# Patient Record
Sex: Female | Born: 1935
Health system: Southern US, Community
[De-identification: ages and names within clinical notes are randomized; demographics above are authoritative.]

## PROBLEM LIST (undated history)

## (undated) DIAGNOSIS — E785 Hyperlipidemia, unspecified: Secondary | ICD-10-CM

## (undated) DIAGNOSIS — T884XXA Failed or difficult intubation, initial encounter: Secondary | ICD-10-CM

## (undated) DIAGNOSIS — I776 Arteritis, unspecified: Secondary | ICD-10-CM

## (undated) DIAGNOSIS — J189 Pneumonia, unspecified organism: Secondary | ICD-10-CM

## (undated) DIAGNOSIS — J3089 Other allergic rhinitis: Secondary | ICD-10-CM

## (undated) DIAGNOSIS — I10 Essential (primary) hypertension: Secondary | ICD-10-CM

## (undated) DIAGNOSIS — R7303 Prediabetes: Secondary | ICD-10-CM

## (undated) DIAGNOSIS — R32 Unspecified urinary incontinence: Secondary | ICD-10-CM

## (undated) DIAGNOSIS — IMO0002 Reserved for concepts with insufficient information to code with codable children: Secondary | ICD-10-CM

## (undated) DIAGNOSIS — N952 Postmenopausal atrophic vaginitis: Secondary | ICD-10-CM

## (undated) DIAGNOSIS — K219 Gastro-esophageal reflux disease without esophagitis: Secondary | ICD-10-CM

## (undated) DIAGNOSIS — B029 Zoster without complications: Secondary | ICD-10-CM

## (undated) DIAGNOSIS — I6529 Occlusion and stenosis of unspecified carotid artery: Secondary | ICD-10-CM

## (undated) DIAGNOSIS — M1990 Unspecified osteoarthritis, unspecified site: Secondary | ICD-10-CM

## (undated) DIAGNOSIS — G629 Polyneuropathy, unspecified: Secondary | ICD-10-CM

## (undated) HISTORY — PX: THROAT SURGERY: SHX803

## (undated) HISTORY — DX: Postmenopausal atrophic vaginitis: N95.2

## (undated) HISTORY — DX: Pneumonia, unspecified organism: J18.9

## (undated) HISTORY — PX: TONSILLECTOMY: SUR1361

## (undated) HISTORY — DX: Unspecified osteoarthritis, unspecified site: M19.90

## (undated) HISTORY — DX: Essential (primary) hypertension: I10

## (undated) HISTORY — DX: Hyperlipidemia, unspecified: E78.5

## (undated) HISTORY — PX: EYE SURGERY: SHX253

## (undated) HISTORY — DX: Reserved for concepts with insufficient information to code with codable children: IMO0002

---

## 1970-08-05 HISTORY — PX: BREAST CYST EXCISION: SHX579

## 1970-08-05 HISTORY — PX: ABDOMINAL HYSTERECTOMY: SHX81

## 2004-04-01 ENCOUNTER — Other Ambulatory Visit: Payer: Self-pay

## 2004-06-13 ENCOUNTER — Ambulatory Visit: Payer: Self-pay | Admitting: Unknown Physician Specialty

## 2004-08-26 ENCOUNTER — Emergency Department: Payer: Self-pay | Admitting: Emergency Medicine

## 2004-09-01 ENCOUNTER — Inpatient Hospital Stay: Payer: Self-pay | Admitting: Internal Medicine

## 2004-10-05 ENCOUNTER — Encounter: Admission: RE | Admit: 2004-10-05 | Discharge: 2004-10-05 | Payer: Self-pay | Admitting: Nephrology

## 2004-10-25 ENCOUNTER — Ambulatory Visit: Payer: Self-pay | Admitting: Unknown Physician Specialty

## 2004-11-03 ENCOUNTER — Ambulatory Visit: Payer: Self-pay | Admitting: Unknown Physician Specialty

## 2004-12-07 ENCOUNTER — Emergency Department: Payer: Self-pay | Admitting: Emergency Medicine

## 2004-12-26 ENCOUNTER — Ambulatory Visit: Payer: Self-pay | Admitting: Unknown Physician Specialty

## 2005-01-09 ENCOUNTER — Ambulatory Visit: Payer: Self-pay | Admitting: Unknown Physician Specialty

## 2005-01-17 ENCOUNTER — Inpatient Hospital Stay: Payer: Self-pay | Admitting: Internal Medicine

## 2005-01-17 ENCOUNTER — Other Ambulatory Visit: Payer: Self-pay

## 2005-02-02 ENCOUNTER — Ambulatory Visit: Payer: Self-pay | Admitting: Unknown Physician Specialty

## 2005-02-25 ENCOUNTER — Ambulatory Visit: Payer: Self-pay | Admitting: Unknown Physician Specialty

## 2005-05-01 ENCOUNTER — Ambulatory Visit: Payer: Self-pay | Admitting: Unknown Physician Specialty

## 2005-08-30 ENCOUNTER — Ambulatory Visit: Payer: Self-pay | Admitting: Unknown Physician Specialty

## 2006-01-29 ENCOUNTER — Other Ambulatory Visit: Payer: Self-pay

## 2006-01-29 ENCOUNTER — Emergency Department: Payer: Self-pay | Admitting: Unknown Physician Specialty

## 2006-04-01 ENCOUNTER — Ambulatory Visit: Payer: Self-pay | Admitting: Unknown Physician Specialty

## 2006-06-19 ENCOUNTER — Ambulatory Visit: Payer: Self-pay | Admitting: Unknown Physician Specialty

## 2006-07-27 ENCOUNTER — Emergency Department: Payer: Self-pay | Admitting: Emergency Medicine

## 2006-08-08 ENCOUNTER — Ambulatory Visit: Payer: Self-pay | Admitting: Unknown Physician Specialty

## 2007-04-01 ENCOUNTER — Other Ambulatory Visit: Payer: Self-pay

## 2007-04-01 ENCOUNTER — Emergency Department: Payer: Self-pay | Admitting: Emergency Medicine

## 2007-04-23 ENCOUNTER — Ambulatory Visit: Payer: Self-pay | Admitting: Unknown Physician Specialty

## 2008-01-01 ENCOUNTER — Ambulatory Visit: Payer: Self-pay | Admitting: Unknown Physician Specialty

## 2008-05-04 ENCOUNTER — Ambulatory Visit: Payer: Self-pay | Admitting: Unknown Physician Specialty

## 2008-06-17 ENCOUNTER — Ambulatory Visit: Payer: Self-pay | Admitting: Unknown Physician Specialty

## 2008-09-14 ENCOUNTER — Ambulatory Visit: Payer: Self-pay | Admitting: Unknown Physician Specialty

## 2009-04-25 ENCOUNTER — Emergency Department: Payer: Self-pay | Admitting: Emergency Medicine

## 2009-05-11 ENCOUNTER — Ambulatory Visit: Payer: Self-pay | Admitting: Unknown Physician Specialty

## 2010-03-19 ENCOUNTER — Ambulatory Visit: Payer: Self-pay | Admitting: Unknown Physician Specialty

## 2010-06-06 ENCOUNTER — Ambulatory Visit: Payer: Self-pay | Admitting: Unknown Physician Specialty

## 2010-07-09 ENCOUNTER — Ambulatory Visit: Payer: Self-pay | Admitting: Unknown Physician Specialty

## 2010-07-15 ENCOUNTER — Emergency Department: Payer: Self-pay | Admitting: Unknown Physician Specialty

## 2010-07-31 ENCOUNTER — Ambulatory Visit: Payer: Self-pay | Admitting: Neurology

## 2011-04-01 ENCOUNTER — Ambulatory Visit: Payer: Self-pay | Admitting: Unknown Physician Specialty

## 2011-04-04 ENCOUNTER — Encounter: Payer: Self-pay | Admitting: Obstetrics & Gynecology

## 2011-04-11 ENCOUNTER — Encounter: Payer: Self-pay | Admitting: Obstetrics & Gynecology

## 2011-04-11 ENCOUNTER — Ambulatory Visit (INDEPENDENT_AMBULATORY_CARE_PROVIDER_SITE_OTHER): Payer: Medicare (Managed Care) | Admitting: Obstetrics & Gynecology

## 2011-04-11 VITALS — BP 128/70 | HR 62 | Ht 67.0 in | Wt 135.0 lb

## 2011-04-11 DIAGNOSIS — N899 Noninflammatory disorder of vagina, unspecified: Secondary | ICD-10-CM

## 2011-04-11 DIAGNOSIS — R102 Pelvic and perineal pain: Secondary | ICD-10-CM

## 2011-04-11 DIAGNOSIS — N949 Unspecified condition associated with female genital organs and menstrual cycle: Secondary | ICD-10-CM

## 2011-04-11 NOTE — Progress Notes (Signed)
  Subjective:    Patient ID: Carlean Purl Dirocco, female    DOB: Oct 06, 1932, 77 y.o.   MRN: [id number]  HPI  Mrs. Ureste is a lovely 77 yo widowed (6 weeks ago) woman who comes in with a 2 month history of "discomfort" in her pelvis and vagina.  She points to her bladder area when I asked her to point to her area of pain.  She uses vaginal estrogen about 2 times per week and has not been sexually active for 10 years.  She had her uterus and ovaries removed many years ago for what sounds like DUB. She is scheduled to have a cystoscopy next week because of this same issue as well as polyuria.  She has been followed by Dr. Orson Slick (urol) for many years.  Review of Systems     Objective:   Physical Exam   Minimal atrophy of vulva/vagina.  Bimanual exam normal (no pain elicited with exam) and no masses. There is a 2 cm transverse line of redness on the anterior vaginal wall about 2 cm in the vagina.  I biopsied it.     Assessment & Plan:  Pelvic pain (normal exam and ultrasound)- I suspect interstitial cystitis and she is getting a cystoscopy next week Linear redness in vagina- I will await biopsy results.

## 2011-04-11 NOTE — Progress Notes (Signed)
Addended by: Barbara Cower on: 04/11/2011 05:36 PM   Modules accepted: Orders

## 2011-05-01 ENCOUNTER — Encounter: Payer: Self-pay | Admitting: Rheumatology

## 2011-05-06 ENCOUNTER — Encounter: Payer: Self-pay | Admitting: Rheumatology

## 2011-08-19 ENCOUNTER — Ambulatory Visit: Payer: Self-pay | Admitting: Unknown Physician Specialty

## 2011-09-12 ENCOUNTER — Emergency Department: Payer: Self-pay | Admitting: Emergency Medicine

## 2011-09-12 LAB — COMPREHENSIVE METABOLIC PANEL
Anion Gap: 14 (ref 7–16)
BUN: 23 mg/dL — ABNORMAL HIGH (ref 7–18)
Bilirubin,Total: 0.5 mg/dL (ref 0.2–1.0)
Chloride: 102 mmol/L (ref 98–107)
Co2: 23 mmol/L (ref 21–32)
Creatinine: 0.94 mg/dL (ref 0.60–1.30)
EGFR (African American): >60
EGFR (Non-African Amer.): >60
Osmolality: 285 (ref 275–301)
Potassium: 3.5 mmol/L (ref 3.5–5.1)
Sodium: 139 mmol/L (ref 136–145)
Total Protein: 7.2 g/dL (ref 6.4–8.2)

## 2011-09-12 LAB — URINALYSIS, COMPLETE
Hyaline Cast: 10
Nitrite: NEGATIVE
Ph: 5 (ref 4.5–8.0)
Protein: NEGATIVE
Specific Gravity: 1.026 (ref 1.003–1.030)

## 2011-09-12 LAB — CBC
HGB: 13.5 g/dL (ref 12.0–16.0)
MCH: 31.4 pg (ref 26.0–34.0)
Platelet: 163 10*3/uL (ref 150–440)
RBC: 4.3 10*6/uL (ref 3.80–5.20)
WBC: 8.1 10*3/uL (ref 3.6–11.0)

## 2011-09-15 LAB — URINE CULTURE

## 2011-10-24 ENCOUNTER — Ambulatory Visit: Payer: Self-pay | Admitting: Unknown Physician Specialty

## 2011-10-25 LAB — PATHOLOGY REPORT

## 2011-11-26 ENCOUNTER — Encounter: Payer: Self-pay | Admitting: Obstetrics & Gynecology

## 2011-11-26 ENCOUNTER — Ambulatory Visit (INDEPENDENT_AMBULATORY_CARE_PROVIDER_SITE_OTHER): Payer: Medicare (Managed Care) | Admitting: Obstetrics & Gynecology

## 2011-11-26 VITALS — BP 120/54 | HR 64 | Ht 67.0 in | Wt 144.0 lb

## 2011-11-26 DIAGNOSIS — R102 Pelvic and perineal pain: Secondary | ICD-10-CM

## 2011-11-26 DIAGNOSIS — N949 Unspecified condition associated with female genital organs and menstrual cycle: Secondary | ICD-10-CM

## 2011-11-26 MED ORDER — ESTROGENS, CONJUGATED 0.625 MG/GM VA CREA: TOPICAL_CREAM | VAGINAL | Status: DC

## 2011-11-26 NOTE — Progress Notes (Signed)
  Subjective:    Patient ID: Dawn Mcintyre, female    DOB: 16-Jul-1933, 78 y.o.   MRN: [id number]  HPI  Ms. Rosano is a 78 yo lady who is here today with the complaint of "something's not right" in her vagina. It feels some sore. Of note, she ran out of her premarin last week. She took a course of antibiotics that she had at home.  Review of Systems     Objective:   Physical Exam  Moderate atrophy. No vaginal lesions      Assessment & Plan:  Atrophic vaginitis- I refilled her premarin.

## 2012-02-13 ENCOUNTER — Ambulatory Visit: Payer: Self-pay | Admitting: Unknown Physician Specialty

## 2012-06-04 ENCOUNTER — Telehealth: Payer: Self-pay

## 2012-06-04 NOTE — Telephone Encounter (Signed)
Outcomes requested medical records on this patient. I sent medical records on 11/31/13 @ 2:30pm faxed to [phone].

## 2012-07-31 ENCOUNTER — Ambulatory Visit: Payer: Self-pay | Admitting: Unknown Physician Specialty

## 2012-08-11 ENCOUNTER — Ambulatory Visit: Payer: Medicare (Managed Care) | Admitting: Family Medicine

## 2012-08-11 DIAGNOSIS — R1084 Generalized abdominal pain: Secondary | ICD-10-CM

## 2012-08-14 ENCOUNTER — Ambulatory Visit: Payer: Self-pay | Admitting: Unknown Physician Specialty

## 2012-09-30 ENCOUNTER — Ambulatory Visit: Payer: Self-pay | Admitting: Unknown Physician Specialty

## 2012-11-16 ENCOUNTER — Encounter: Payer: Self-pay | Admitting: Obstetrics & Gynecology

## 2012-11-16 ENCOUNTER — Ambulatory Visit (INDEPENDENT_AMBULATORY_CARE_PROVIDER_SITE_OTHER): Payer: Medicare (Managed Care) | Admitting: Obstetrics & Gynecology

## 2012-11-16 VITALS — BP 131/76 | HR 60 | Resp 16 | Ht 67.0 in | Wt 142.0 lb

## 2012-11-16 DIAGNOSIS — R300 Dysuria: Secondary | ICD-10-CM

## 2012-11-16 DIAGNOSIS — N952 Postmenopausal atrophic vaginitis: Secondary | ICD-10-CM

## 2012-11-16 MED ORDER — ESTRADIOL 0.1 MG/GM VA CREA: TOPICAL_CREAM | VAGINAL | Status: DC

## 2012-11-16 NOTE — Progress Notes (Signed)
  Subjective:    Patient ID: Dawn Mcintyre, female    DOB: [birth year], 79 y.o.   MRN: [id number]  HPI 79 yo WW lady who is here today because of post voiding vaginal pain. She is currently on amoxacillin to treat a UTI. She used her Premarin cream last year for about 4 months and had good relief of her symptoms. However, she felt so good that she discontinued using the cream.   Review of Systems   She has not been sexually active for about 12 years. Her mammogram at [facility] is UTD. She had a tail bone injury recently. Objective:   Physical Exam  Severely atrophic vulva and vagina. Speculum exam reveals a normal/atrophic vaginal cuff. Normal bimanual exam      Assessment & Plan:  Post void pain- This may be successfully treated with vaginal estrogen (QOD). If this does not cure her, then she will need a urology referral.

## 2013-02-12 ENCOUNTER — Ambulatory Visit: Payer: Self-pay | Admitting: Unknown Physician Specialty

## 2013-02-23 ENCOUNTER — Encounter: Payer: Self-pay | Admitting: Obstetrics & Gynecology

## 2013-02-23 ENCOUNTER — Ambulatory Visit (INDEPENDENT_AMBULATORY_CARE_PROVIDER_SITE_OTHER): Payer: Medicare Other | Admitting: Obstetrics & Gynecology

## 2013-02-23 VITALS — BP 131/62 | HR 60 | Ht 67.0 in | Wt 144.0 lb

## 2013-02-23 DIAGNOSIS — N898 Other specified noninflammatory disorders of vagina: Secondary | ICD-10-CM

## 2013-02-23 DIAGNOSIS — R109 Unspecified abdominal pain: Secondary | ICD-10-CM

## 2013-02-23 NOTE — Patient Instructions (Addendum)
Return to clinic for any scheduled appointments or for any gynecologic concerns as needed.   

## 2013-02-23 NOTE — Progress Notes (Signed)
GYNECOLOGY CLINIC PROGRESS NOTE  History:  79 y.o. PMP female here today for evaluation of abnormal vaginal discharge x 1 week. Associated with lower abdominal discomfort.  Also reports that this discomfort occurs 2-3 minutes after urination. Patient reports that she had these symptoms in the past and they were successfully treated with a course of vaginal Premarin cream.  She has tried this over this week, but there was no amelioration of symptoms.  No association with bowel movements.  No other GYN symptoms.    The following portions of the patient's history were reviewed and updated as appropriate: allergies, current medications, past family history, past medical history, past social history, past surgical history and problem list.  Review of Systems:  Pertinent items are noted in HPI.  Objective:  Physical Exam BP 131/62  Pulse 60  Ht 5\' 7"  (1.702 m)  Wt 144 lb (65.318 kg)  BMI 22.55 kg/m2 Gen: NAD Abd: Soft, nontender to palpation and nondistended Pelvic: Normal appearing external genitalia; vaginal mucosa with moderate atrophy. Cervical cuff is atrophic, mild prolapse noted. Scant white discharge, sample obtained for wet prep  Urine dipstick: Negative for nitrites, LE, blood.  Culture sent.   Assessment & Plan:  Patient advised to continue Premarin cream every other day as prescribed Will follow up urine culture and wet prep and manage accordingly

## 2013-02-24 LAB — WET PREP, GENITAL
Trich, Wet Prep: NONE SEEN
Yeast Wet Prep HPF POC: NONE SEEN

## 2013-02-25 LAB — URINE CULTURE: Colony Count: 2000

## 2013-08-02 ENCOUNTER — Ambulatory Visit: Payer: Self-pay | Admitting: Physician Assistant

## 2013-10-04 ENCOUNTER — Ambulatory Visit: Payer: Self-pay | Admitting: Internal Medicine

## 2014-01-28 DIAGNOSIS — I10 Essential (primary) hypertension: Secondary | ICD-10-CM | POA: Insufficient documentation

## 2014-01-28 DIAGNOSIS — E119 Type 2 diabetes mellitus without complications: Secondary | ICD-10-CM | POA: Insufficient documentation

## 2014-03-18 ENCOUNTER — Ambulatory Visit: Payer: Self-pay | Admitting: Physician Assistant

## 2014-04-07 DIAGNOSIS — M5432 Sciatica, left side: Secondary | ICD-10-CM | POA: Insufficient documentation

## 2014-04-28 DIAGNOSIS — I10 Essential (primary) hypertension: Secondary | ICD-10-CM | POA: Insufficient documentation

## 2014-06-15 DIAGNOSIS — G8929 Other chronic pain: Secondary | ICD-10-CM | POA: Insufficient documentation

## 2014-06-15 DIAGNOSIS — R1013 Epigastric pain: Secondary | ICD-10-CM

## 2014-10-18 ENCOUNTER — Ambulatory Visit: Payer: Self-pay | Admitting: Internal Medicine

## 2014-10-24 ENCOUNTER — Encounter (INDEPENDENT_AMBULATORY_CARE_PROVIDER_SITE_OTHER): Payer: Medicare Other | Admitting: Ophthalmology

## 2014-10-24 DIAGNOSIS — H318 Other specified disorders of choroid: Secondary | ICD-10-CM

## 2014-10-24 DIAGNOSIS — E11319 Type 2 diabetes mellitus with unspecified diabetic retinopathy without macular edema: Secondary | ICD-10-CM | POA: Diagnosis not present

## 2014-10-24 DIAGNOSIS — E11329 Type 2 diabetes mellitus with mild nonproliferative diabetic retinopathy without macular edema: Secondary | ICD-10-CM

## 2014-10-24 DIAGNOSIS — I10 Essential (primary) hypertension: Secondary | ICD-10-CM | POA: Diagnosis not present

## 2014-10-24 DIAGNOSIS — H43813 Vitreous degeneration, bilateral: Secondary | ICD-10-CM | POA: Diagnosis not present

## 2014-10-24 DIAGNOSIS — H35033 Hypertensive retinopathy, bilateral: Secondary | ICD-10-CM | POA: Diagnosis not present

## 2014-10-26 DIAGNOSIS — E538 Deficiency of other specified B group vitamins: Secondary | ICD-10-CM | POA: Insufficient documentation

## 2014-11-15 ENCOUNTER — Ambulatory Visit (INDEPENDENT_AMBULATORY_CARE_PROVIDER_SITE_OTHER): Payer: Medicare Other | Admitting: Ophthalmology

## 2014-11-15 DIAGNOSIS — H318 Other specified disorders of choroid: Secondary | ICD-10-CM

## 2014-11-16 ENCOUNTER — Ambulatory Visit (INDEPENDENT_AMBULATORY_CARE_PROVIDER_SITE_OTHER): Payer: Medicare Other | Admitting: Advanced Practice Midwife

## 2014-11-16 ENCOUNTER — Encounter: Payer: Self-pay | Admitting: Advanced Practice Midwife

## 2014-11-16 VITALS — BP 147/70 | HR 79 | Wt 149.0 lb

## 2014-11-16 DIAGNOSIS — N760 Acute vaginitis: Secondary | ICD-10-CM | POA: Diagnosis not present

## 2014-11-16 DIAGNOSIS — N952 Postmenopausal atrophic vaginitis: Secondary | ICD-10-CM | POA: Insufficient documentation

## 2014-11-16 HISTORY — DX: Postmenopausal atrophic vaginitis: N95.2

## 2014-11-16 MED ORDER — ESTROGENS, CONJUGATED 0.625 MG/GM VA CREA: TOPICAL_CREAM | VAGINAL | Status: DC

## 2014-11-16 NOTE — Progress Notes (Signed)
   Subjective:    Patient ID: Dawn Mcintyre, female    DOB: 02-Dec-1932, 81 y.o.   MRN: [id number]  HPI: Here for vaginal irritation and burning x 1 month. Went to PCP 3 weeks ago. Dx. BV. Rx Metrogel. Got better for a week, but then returned. Had refill and used it w/ out relief. Has used Premarin in the past for atrophic vaginitis, but ran out. Not sexually active.   Review of Systems  Pos for Sx above.  Neg for vaginal bleeding, abd pain, vaginal discharge.  Pos for HA while on Metrogel. Gone since she stopped.     Objective:   Physical Exam  GENERAL: NAD ABD: Soft, NT PELVIC: Mild erythema at introitus. Vaginal atrophic. Moderate amount of thick, white discharge mixed w/ ? Left-over Metrogel?  UA neg    Assessment & Plan:  1. Post-menopausal atrophic vaginitis  - conjugated estrogens (PREMARIN) vaginal cream; Use 1 gram per vagina every other night.  Dispense: 42.5 g; Refill: 12  2. Vaginitis  - Wet prep, genital  If Wet pos BV, will try Clinda instead of Metronidazole.   [place], CNM 11/16/2014 1:57 PM

## 2014-11-16 NOTE — Patient Instructions (Signed)

## 2014-11-17 ENCOUNTER — Telehealth: Payer: Self-pay | Admitting: *Deleted

## 2014-11-17 LAB — WET PREP, GENITAL
Clue Cells Wet Prep HPF POC: NONE SEEN
Trich, Wet Prep: NONE SEEN
WBC WET PREP: NONE SEEN
Yeast Wet Prep HPF POC: NONE SEEN

## 2014-11-17 NOTE — Telephone Encounter (Signed)
Patient called for test results.  Notified of normal results and patient will continue to use her estrogen cream.

## 2014-12-16 ENCOUNTER — Encounter (INDEPENDENT_AMBULATORY_CARE_PROVIDER_SITE_OTHER): Payer: Medicare Other | Admitting: Ophthalmology

## 2015-01-20 ENCOUNTER — Encounter (INDEPENDENT_AMBULATORY_CARE_PROVIDER_SITE_OTHER): Payer: Medicare Other | Admitting: Ophthalmology

## 2015-01-20 DIAGNOSIS — H35033 Hypertensive retinopathy, bilateral: Secondary | ICD-10-CM | POA: Diagnosis not present

## 2015-01-20 DIAGNOSIS — H318 Other specified disorders of choroid: Secondary | ICD-10-CM | POA: Diagnosis not present

## 2015-01-25 DIAGNOSIS — K219 Gastro-esophageal reflux disease without esophagitis: Secondary | ICD-10-CM | POA: Insufficient documentation

## 2015-01-25 DIAGNOSIS — E119 Type 2 diabetes mellitus without complications: Secondary | ICD-10-CM | POA: Insufficient documentation

## 2015-01-25 DIAGNOSIS — F5104 Psychophysiologic insomnia: Secondary | ICD-10-CM | POA: Insufficient documentation

## 2015-01-25 DIAGNOSIS — N3946 Mixed incontinence: Secondary | ICD-10-CM | POA: Insufficient documentation

## 2015-04-25 ENCOUNTER — Encounter (INDEPENDENT_AMBULATORY_CARE_PROVIDER_SITE_OTHER): Payer: Medicare Other | Admitting: Ophthalmology

## 2015-04-25 DIAGNOSIS — H318 Other specified disorders of choroid: Secondary | ICD-10-CM

## 2015-04-25 DIAGNOSIS — I10 Essential (primary) hypertension: Secondary | ICD-10-CM | POA: Diagnosis not present

## 2015-04-25 DIAGNOSIS — H3531 Nonexudative age-related macular degeneration: Secondary | ICD-10-CM

## 2015-04-25 DIAGNOSIS — H43813 Vitreous degeneration, bilateral: Secondary | ICD-10-CM | POA: Diagnosis not present

## 2015-04-25 DIAGNOSIS — H35033 Hypertensive retinopathy, bilateral: Secondary | ICD-10-CM

## 2015-05-06 ENCOUNTER — Emergency Department: Payer: Medicare Other

## 2015-05-06 ENCOUNTER — Emergency Department
Admission: EM | Admit: 2015-05-06 | Discharge: 2015-05-07 | Disposition: A | Discharge status: Home / Self Care | Payer: Medicare Other | Attending: Emergency Medicine | Admitting: Emergency Medicine

## 2015-05-06 DIAGNOSIS — R079 Chest pain, unspecified: Secondary | ICD-10-CM | POA: Diagnosis present

## 2015-05-06 DIAGNOSIS — Z91040 Latex allergy status: Secondary | ICD-10-CM | POA: Diagnosis not present

## 2015-05-06 DIAGNOSIS — Z7982 Long term (current) use of aspirin: Secondary | ICD-10-CM | POA: Insufficient documentation

## 2015-05-06 DIAGNOSIS — Z79899 Other long term (current) drug therapy: Secondary | ICD-10-CM | POA: Diagnosis not present

## 2015-05-06 DIAGNOSIS — I10 Essential (primary) hypertension: Secondary | ICD-10-CM | POA: Diagnosis not present

## 2015-05-06 DIAGNOSIS — Z880 Allergy status to penicillin: Secondary | ICD-10-CM | POA: Diagnosis not present

## 2015-05-06 DIAGNOSIS — A084 Viral intestinal infection, unspecified: Secondary | ICD-10-CM | POA: Insufficient documentation

## 2015-05-06 LAB — BASIC METABOLIC PANEL
ANION GAP: 11 (ref 5–15)
BUN: 25 mg/dL — AB (ref 6–20)
CALCIUM: 9.9 mg/dL (ref 8.9–10.3)
CO2: 29 mmol/L (ref 22–32)
Chloride: 97 mmol/L — ABNORMAL LOW (ref 101–111)
Creatinine, Ser: 1.15 mg/dL — ABNORMAL HIGH (ref 0.44–1.00)
GFR calc Af Amer: 51 mL/min — ABNORMAL LOW (ref >60)
GFR calc non Af Amer: 44 mL/min — ABNORMAL LOW (ref >60)
GLUCOSE: 159 mg/dL — AB (ref 65–99)
Potassium: 3.6 mmol/L (ref 3.5–5.1)
Sodium: 137 mmol/L (ref 135–145)

## 2015-05-06 LAB — CBC
HEMATOCRIT: 42.8 % (ref 35.0–47.0)
HEMOGLOBIN: 14.4 g/dL (ref 12.0–16.0)
MCH: 30.9 pg (ref 26.0–34.0)
MCHC: 33.7 g/dL (ref 32.0–36.0)
MCV: 91.6 fL (ref 80.0–100.0)
Platelets: 190 10*3/uL (ref 150–440)
RBC: 4.67 MIL/uL (ref 3.80–5.20)
RDW: 13.3 % (ref 11.5–14.5)
WBC: 9.4 10*3/uL (ref 3.6–11.0)

## 2015-05-06 LAB — TROPONIN I

## 2015-05-06 MED ORDER — ONDANSETRON HCL 4 MG/2ML IJ SOLN
4.0000 mg | Freq: Once | INTRAMUSCULAR | Status: AC
  Administered 2015-05-06: 4 mg via INTRAVENOUS

## 2015-05-06 MED ORDER — ONDANSETRON HCL 4 MG/2ML IJ SOLN
INTRAMUSCULAR | Status: AC
  Filled 2015-05-06: qty 2

## 2015-05-06 MED ORDER — GI COCKTAIL ~~LOC~~
ORAL | Status: AC
  Filled 2015-05-06: qty 30

## 2015-05-06 MED ORDER — GI COCKTAIL ~~LOC~~
30.0000 mL | Freq: Once | ORAL | Status: AC
  Administered 2015-05-06: 30 mL via ORAL

## 2015-05-06 NOTE — ED Notes (Signed)
Pt vomiting after gi cocktail administration. Order for zofran received.

## 2015-05-06 NOTE — ED Notes (Signed)
Pt assisted up to commode for diarrhea stool.

## 2015-05-06 NOTE — ED Notes (Signed)
Pt complains of "throat burning and i have such bad indigestion, is there something you can do to make me feel better, please help me?" md notified, order for gi cocktail received.

## 2015-05-06 NOTE — ED Notes (Signed)
Patient reports nausea, vomiting and chest pain for the past 4 hours.

## 2015-05-06 NOTE — ED Provider Notes (Signed)
[facility] Emergency Department Provider Note  ____________________________________________  Time seen: Approximately 11:38 PM  I have reviewed the triage vital signs and the nursing notes.   HISTORY  Chief Complaint Chest Pain    HPI Dawn Mcintyre is a 81 y.o. female with a history of borderline diabetes and who otherwise is relatively healthy for her age who presents with onset today of persistent nausea, vomiting, diarrhea.  She reports that she was in the hospital all day yesterday with a relative.  Today she was headed to the hospital and she was overcome with nausea and had to have the car pulled over for her to vomit on the side of the road.  She then immediately began having diarrhea.  She states that she has had at least 10 episodes of foul-smelling diarrhea today as well as about that many episodes of vomiting.  She is having a significant amount of burning in her chest associated with the vomiting and she feels like she is having "really bad acid reflux".  The symptoms are described as severe and they were relatively acute onset much earlier today.Nothing makes them better and nothing makes it worse, although she briefly feels better after having a bowel movement or vomiting though the symptoms to return.  She denies any lower abdominal pain, any other chest pain except for the burning, shortness of breath, fever/chills, dysuria.   Past Medical History  Diagnosis Date  . Arthritis   . Ulcer   . Pneumonia      x 81yrs ago  . Hyperlipidemia   . Hypertension   . Diabetes mellitus     border line  . Cataract   . Osteoporosis   . Atrophic vaginitis 11/16/2014    Patient Active Problem List   Diagnosis Date Noted  . Atrophic vaginitis 11/16/2014    Past Surgical History  Procedure Laterality Date  . Throat surgery      x 10 yrs ago. throat mass.  . Abdominal hysterectomy  1972  . Eye surgery      Current Outpatient Rx  Name  Route  Sig   Dispense  Refill  . ALPRAZolam (XANAX) 0.25 MG tablet   Oral   Take 0.25 mg by mouth at bedtime as needed.           Marland Kitchen aspirin 81 MG tablet   Oral   Take 81 mg by mouth daily.           Marland Kitchen conjugated estrogens (PREMARIN) vaginal cream      Use 1 gram per vagina every other night.   42.5 g   12   . Cranberry 400 MG CAPS   Oral   Take 1 tablet by mouth 2 (two) times daily.         . cyanocobalamin (,VITAMIN B-12,) 1000 MCG/ML injection   Intramuscular   Inject 1 mL into the muscle every 30 (thirty) days.         . fexofenadine (ALLEGRA) 180 MG tablet   Oral   Take 1 tablet by mouth as needed.         . fluticasone (FLONASE) 50 MCG/ACT nasal spray   Nasal   Place 2 sprays into the nose daily.         Marland Kitchen gabapentin (NEURONTIN) 100 MG capsule   Oral   Take 100 mg by mouth 3 (three) times daily.           . isosorbide mononitrate (IMDUR) 60 MG 24  hr tablet   Oral   Take 60 mg by mouth daily.           . metoprolol (LOPRESSOR) 100 MG tablet   Oral   Take 100 mg by mouth once.           . pantoprazole (PROTONIX) 40 MG tablet   Oral   Take 1 tablet by mouth daily.         . predniSONE (DELTASONE) 2.5 MG tablet   Oral   Take 2.5 mg by mouth 2 (two) times a week.           . valsartan-hydrochlorothiazide (DIOVAN-HCT) 160-12.5 MG per tablet   Oral   Take 1 tablet by mouth 2 (two) times daily.           . ondansetron (ZOFRAN) 4 MG tablet      Take 1-2 tabs by mouth every 8 hours as needed for nausea/vomiting   30 tablet   0     Allergies Amoxicillin-pot clavulanate; Codeine; Hydrocodone; Metronidazole; Propoxyphene; and Latex  Family History  Problem Relation Age of Onset  . Anuerysm Mother   . Cancer Father     prostrate cancer  . Diabetes Brother   . Hearing loss Brother     Social History Social History  Substance Use Topics  . Smoking status: Never Smoker   . Smokeless tobacco: Never Used  . Alcohol Use: No    Review of  Systems Constitutional: No fever/chills Eyes: No visual changes. ENT: No sore throat. Cardiovascular: Burning substernal chest pain Respiratory: Denies shortness of breath.  Recent cough for several days. Gastrointestinal: No abdominal pain, occasional cramping associated with the diarrhea.  Persistent vomiting and foul-smelling watery diarrhea today.  No constipation. Genitourinary: Negative for dysuria. Musculoskeletal: Negative for back pain. Skin: Negative for rash. Neurological: Negative for headaches, focal weakness or numbness.  10-point ROS otherwise negative.  ____________________________________________   PHYSICAL EXAM:  VITAL SIGNS: ED Triage Vitals  Enc Vitals Group     BP 05/06/15 2157 155/72 mmHg     Pulse Rate 05/06/15 2157 81     Resp 05/06/15 2235 16     Temp 05/06/15 2157 97.7 F (36.5 C)     Temp Source 05/06/15 2157 Oral     SpO2 05/06/15 2157 95 %     Weight 05/06/15 2150 149 lb (67.586 kg)     Height 05/06/15 2150 5' 4.5" (1.638 m)     Head Cir --      Peak Flow --      Pain Score 05/06/15 2148 8     Pain Loc --      Pain Edu? --      Excl. in [place]? --     Constitutional: Alert and oriented.  No acute distress.  Appears younger than stated age.  Surprisingly well-appearing given her current symptoms.   Eyes: Conjunctivae are normal. PERRL. EOMI. Head: Atraumatic. Nose: No congestion/rhinnorhea. Mouth/Throat: Mucous membranes are moist.  Oropharynx non-erythematous. Neck: No stridor.   Cardiovascular: Normal rate, regular rhythm. Grossly normal heart sounds.  Good peripheral circulation. Respiratory: Normal respiratory effort.  No retractions. Lungs CTAB. Gastrointestinal: Soft and nontender. No distention. No abdominal bruits. No CVA tenderness. Musculoskeletal: No lower extremity tenderness nor edema.  No joint effusions. Neurologic:  Normal speech and language. No gross focal neurologic deficits are appreciated.  Skin:  Skin is warm, dry and  intact. No rash noted. Psychiatric: Mood and affect are normal. Speech and behavior are normal.  ____________________________________________   LABS (all labs ordered are listed, but only abnormal results are displayed)  Labs Reviewed  BASIC METABOLIC PANEL - Abnormal; Notable for the following:    Chloride 97 (*)    Glucose, Bld 159 (*)    BUN 25 (*)    Creatinine, Ser 1.15 (*)    GFR calc non Af Amer 44 (*)    GFR calc Af Amer 51 (*)    All other components within normal limits  HEPATIC FUNCTION PANEL - Abnormal; Notable for the following:    Indirect Bilirubin 1.1 (*)    All other components within normal limits  CBC  TROPONIN I  LIPASE, BLOOD   ____________________________________________  EKG  ED ECG REPORT I, Lorissa Kishbaugh, the attending physician, personally viewed and interpreted this ECG.  Date: 05/07/2015 EKG Time: 21:51 Rate: 80 Rhythm: normal sinus rhythm QRS Axis: normal Intervals: normal ST/T Wave abnormalities: normal Conduction Disutrbances: none Narrative Interpretation: unremarkable  ____________________________________________  RADIOLOGY   Dg Chest 2 View  05/06/2015   CLINICAL DATA:  Acute onset of left-sided chest pain, vomiting and diarrhea. Initial encounter.  EXAM: CHEST  2 VIEW  COMPARISON:  CTA of the chest, and chest radiograph, performed 04/01/2007  FINDINGS: The lungs are well-aerated. Mild peribronchial thickening is noted. Mild left basilar atelectasis is noted. There is no evidence of pleural effusion or pneumothorax.  The heart is normal in size; the mediastinal contour is within normal limits. No acute osseous abnormalities are seen.  IMPRESSION: Mild peribronchial thickening noted. Mild left basilar atelectasis noted.   Electronically Signed   By: Garald Balding M.D.   On: 05/06/2015 22:39    ____________________________________________   PROCEDURES  Procedure(s) performed: None  Critical Care performed:  No ____________________________________________   INITIAL IMPRESSION / ASSESSMENT AND PLAN / ED COURSE  Pertinent labs & imaging results that were available during my care of the patient were reviewed by me and considered in my medical decision making (see chart for details).  The patient symptoms seem most consistent with a viral gastroenteritis.  She has had diarrhea since arriving to the room in the ED.  She has no risk factors to be concerned about C. difficile.  She is well-appearing with normal vital signs in spite of her symptoms and discomfort.  We will attempt to control her nausea and her feeling of reflux with Zofran and pantoprazole and reassess.  I do not believe her symptoms represent ACS.  She does appear to have a mild viral bronchitis as well but no sign that she would benefit from antibiotics.  She has no respiratory difficulties at this time.  I explained my plan of symptom control and discharge home if possible and she understands and agrees with plan.  ----------------------------------------- 1:11 AM on 05/07/2015 -----------------------------------------  Reportedly the patient is feeling much better at this time with improved nausea and no more pain in her chest.  She has not had any additional diarrhea.  I reassessed her and she feels very comfortable and wants to go home and go to sleep.  I gave her my usual and customary return precautions and encouraged her to come back immediately if she develops any new or worsening symptoms or if she is not able to tolerate by mouth intake. ____________________________________________  FINAL CLINICAL IMPRESSION(S) / ED DIAGNOSES  Final diagnoses:  Viral gastroenteritis      NEW MEDICATIONS STARTED DURING THIS VISIT:  Discharge Medication List as of 05/07/2015  1:15 AM    START  taking these medications   Details  ondansetron (ZOFRAN) 4 MG tablet Take 1-2 tabs by mouth every 8 hours as needed for nausea/vomiting, Print          Hinda Kehr, MD 05/07/15 [phone]

## 2015-05-06 NOTE — ED Notes (Signed)
Report received from beth, rn. Pt placed on cardiac monitor, with sinus rhythm present.

## 2015-05-07 LAB — HEPATIC FUNCTION PANEL
ALK PHOS: 68 U/L (ref 38–126)
ALT: 14 U/L (ref 14–54)
AST: 23 U/L (ref 15–41)
Albumin: 4.3 g/dL (ref 3.5–5.0)
BILIRUBIN DIRECT: 0.1 mg/dL (ref 0.1–0.5)
BILIRUBIN INDIRECT: 1.1 mg/dL — AB (ref 0.3–0.9)
Total Bilirubin: 1.2 mg/dL (ref 0.3–1.2)
Total Protein: 7.6 g/dL (ref 6.5–8.1)

## 2015-05-07 LAB — LIPASE, BLOOD: LIPASE: 25 U/L (ref 22–51)

## 2015-05-07 MED ORDER — ONDANSETRON HCL 4 MG PO TABS: ORAL_TABLET | ORAL | Status: DC

## 2015-05-07 MED ORDER — PANTOPRAZOLE SODIUM 40 MG IV SOLR
40.0000 mg | Freq: Once | INTRAVENOUS | Status: AC
  Administered 2015-05-07: 40 mg via INTRAVENOUS
  Filled 2015-05-07: qty 40

## 2015-05-07 NOTE — Discharge Instructions (Signed)
We believe your symptoms are caused by either a viral infection or possible a bad food exposure.  Either way, since your symptoms have improved, we feel it is safe for you to go home and follow up with your regular doctor.  Please read the included information and stick to a bland diet for the next two days.  Drink plenty of clear fluids, and if you were provided with a prescription, please take it according to the label instructions.    If you develop any new or worsening symptoms, including persistent vomiting not controlled with medication, fever greater than 101, severe or worsening abdominal pain, or other symptoms that concern you, please return immediately to the Emergency Department.    Viral Gastroenteritis Viral gastroenteritis is also known as stomach flu. This condition affects the stomach and intestinal tract. It can cause sudden diarrhea and vomiting. The illness typically lasts 3 to 8 days. Most people develop an immune response that eventually gets rid of the virus. While this natural response develops, the virus can make you quite ill. CAUSES  Many different viruses can cause gastroenteritis, such as rotavirus or noroviruses. You can catch one of these viruses by consuming contaminated food or water. You may also catch a virus by sharing utensils or other personal items with an infected person or by touching a contaminated surface. SYMPTOMS  The most common symptoms are diarrhea and vomiting. These problems can cause a severe loss of body fluids (dehydration) and a body salt (electrolyte) imbalance. Other symptoms may include:  Fever.  Headache.  Fatigue.  Abdominal pain. DIAGNOSIS  Your caregiver can usually diagnose viral gastroenteritis based on your symptoms and a physical exam. A stool sample may also be taken to test for the presence of viruses or other infections. TREATMENT  This illness typically goes away on its own. Treatments are aimed at rehydration. The most  serious cases of viral gastroenteritis involve vomiting so severely that you are not able to keep fluids down. In these cases, fluids must be given through an intravenous line (IV). HOME CARE INSTRUCTIONS   Drink enough fluids to keep your urine clear or pale yellow. Drink small amounts of fluids frequently and increase the amounts as tolerated.  Ask your caregiver for specific rehydration instructions.  Avoid:  Foods high in sugar.  Alcohol.  Carbonated drinks.  Tobacco.  Juice.  Caffeine drinks.  Extremely hot or cold fluids.  Fatty, greasy foods.  Too much intake of anything at one time.  Dairy products until 24 to 48 hours after diarrhea stops.  You may consume probiotics. Probiotics are active cultures of beneficial bacteria. They may lessen the amount and number of diarrheal stools in adults. Probiotics can be found in yogurt with active cultures and in supplements.  Wash your hands well to avoid spreading the virus.  Only take over-the-counter or prescription medicines for pain, discomfort, or fever as directed by your caregiver. Do not give aspirin to children. Antidiarrheal medicines are not recommended.  Ask your caregiver if you should continue to take your regular prescribed and over-the-counter medicines.  Keep all follow-up appointments as directed by your caregiver. SEEK IMMEDIATE MEDICAL CARE IF:   You are unable to keep fluids down.  You do not urinate at least once every 6 to 8 hours.  You develop shortness of breath.  You notice blood in your stool or vomit. This may look like coffee grounds.  You have abdominal pain that increases or is concentrated in one small area (  localized).  You have persistent vomiting or diarrhea.  You have a fever.  The patient is a child younger than 3 months, and he or she has a fever.  The patient is a child older than 3 months, and he or she has a fever and persistent symptoms.  The patient is a child older  than 3 months, and he or she has a fever and symptoms suddenly get worse.  The patient is a baby, and he or she has no tears when crying. MAKE SURE YOU:   Understand these instructions.  Will watch your condition.  Will get help right away if you are not doing well or get worse. Document Released: 07/22/2005 Document Revised: 10/14/2011 Document Reviewed: 05/08/2011 [facility] Patient Information 2015 [place], [place]. This information is not intended to replace advice given to you by your health care provider. Make sure you discuss any questions you have with your health care provider.

## 2015-05-07 NOTE — ED Notes (Signed)
Pt states has not pain currently, states nausea is gone. Pt states "i feel so much better, i just want to go home and get in my bed, thank you for helping me."

## 2015-05-07 NOTE — ED Notes (Signed)
Pt up to commode for diarrhea stool. Pt states nausea improved, pt denies pain or burning currently.

## 2015-07-19 DIAGNOSIS — N183 Chronic kidney disease, stage 3 unspecified: Secondary | ICD-10-CM | POA: Insufficient documentation

## 2015-07-19 DIAGNOSIS — M8580 Other specified disorders of bone density and structure, unspecified site: Secondary | ICD-10-CM | POA: Insufficient documentation

## 2015-07-19 DIAGNOSIS — E1122 Type 2 diabetes mellitus with diabetic chronic kidney disease: Secondary | ICD-10-CM | POA: Insufficient documentation

## 2015-09-16 ENCOUNTER — Encounter: Payer: Self-pay | Admitting: Emergency Medicine

## 2015-09-16 ENCOUNTER — Emergency Department
Admission: EM | Admit: 2015-09-16 | Discharge: 2015-09-16 | Disposition: A | Discharge status: Home / Self Care | Payer: Medicare Other | Attending: Emergency Medicine | Admitting: Emergency Medicine

## 2015-09-16 DIAGNOSIS — Z7951 Long term (current) use of inhaled steroids: Secondary | ICD-10-CM | POA: Diagnosis not present

## 2015-09-16 DIAGNOSIS — I10 Essential (primary) hypertension: Secondary | ICD-10-CM | POA: Diagnosis not present

## 2015-09-16 DIAGNOSIS — Z91040 Latex allergy status: Secondary | ICD-10-CM | POA: Diagnosis not present

## 2015-09-16 DIAGNOSIS — I159 Secondary hypertension, unspecified: Secondary | ICD-10-CM | POA: Insufficient documentation

## 2015-09-16 DIAGNOSIS — Z880 Allergy status to penicillin: Secondary | ICD-10-CM | POA: Insufficient documentation

## 2015-09-16 DIAGNOSIS — H6692 Otitis media, unspecified, left ear: Secondary | ICD-10-CM | POA: Insufficient documentation

## 2015-09-16 DIAGNOSIS — H6122 Impacted cerumen, left ear: Secondary | ICD-10-CM | POA: Insufficient documentation

## 2015-09-16 DIAGNOSIS — H6690 Otitis media, unspecified, unspecified ear: Secondary | ICD-10-CM

## 2015-09-16 DIAGNOSIS — H9202 Otalgia, left ear: Secondary | ICD-10-CM | POA: Diagnosis present

## 2015-09-16 DIAGNOSIS — Z79899 Other long term (current) drug therapy: Secondary | ICD-10-CM | POA: Insufficient documentation

## 2015-09-16 DIAGNOSIS — Z7982 Long term (current) use of aspirin: Secondary | ICD-10-CM | POA: Diagnosis not present

## 2015-09-16 LAB — COMPREHENSIVE METABOLIC PANEL
ALT: 12 U/L — ABNORMAL LOW (ref 14–54)
AST: 20 U/L (ref 15–41)
Albumin: 4 g/dL (ref 3.5–5.0)
Alkaline Phosphatase: 67 U/L (ref 38–126)
Anion gap: 6 (ref 5–15)
BILIRUBIN TOTAL: 0.8 mg/dL (ref 0.3–1.2)
BUN: 18 mg/dL (ref 6–20)
CALCIUM: 10.2 mg/dL (ref 8.9–10.3)
CO2: 32 mmol/L (ref 22–32)
CREATININE: 1.02 mg/dL — AB (ref 0.44–1.00)
Chloride: 101 mmol/L (ref 101–111)
GFR calc Af Amer: 59 mL/min — ABNORMAL LOW (ref >60)
GFR, EST NON AFRICAN AMERICAN: 51 mL/min — AB (ref >60)
Glucose, Bld: 122 mg/dL — ABNORMAL HIGH (ref 65–99)
POTASSIUM: 4.5 mmol/L (ref 3.5–5.1)
Sodium: 139 mmol/L (ref 135–145)
TOTAL PROTEIN: 7.1 g/dL (ref 6.5–8.1)

## 2015-09-16 LAB — CBC
HCT: 39.6 % (ref 35.0–47.0)
Hemoglobin: 13.4 g/dL (ref 12.0–16.0)
MCH: 30.4 pg (ref 26.0–34.0)
MCHC: 33.8 g/dL (ref 32.0–36.0)
MCV: 89.8 fL (ref 80.0–100.0)
PLATELETS: 208 10*3/uL (ref 150–440)
RBC: 4.41 MIL/uL (ref 3.80–5.20)
RDW: 13.4 % (ref 11.5–14.5)
WBC: 7.5 10*3/uL (ref 3.6–11.0)

## 2015-09-16 LAB — TROPONIN I: Troponin I: <0.03 ng/mL (ref <0.031)

## 2015-09-16 MED ORDER — AZITHROMYCIN 250 MG PO TABS
ORAL_TABLET | ORAL | Status: AC
Start: 2015-09-16 — End: 2015-09-21

## 2015-09-16 NOTE — ED Notes (Signed)
Noticed blood pressure elevated at home since yesterday, yesterday systolic 0000000, today 0000000

## 2015-09-16 NOTE — ED Notes (Signed)
During triage states has L arm tingling began last night.

## 2015-09-16 NOTE — Discharge Instructions (Signed)
Please seek medical attention for any high fevers, chest pain, shortness of breath, change in behavior, persistent vomiting, bloody stool or any other new or concerning symptoms.   Hypertension Hypertension is another name for high blood pressure. High blood pressure forces your heart to work harder to pump blood. A blood pressure reading has two numbers, which includes a higher number over a lower number (example: 110/72). HOME CARE   Have your blood pressure rechecked by your doctor.  Only take medicine as told by your doctor. Follow the directions carefully. The medicine does not work as well if you skip doses. Skipping doses also puts you at risk for problems.  Do not smoke.  Monitor your blood pressure at home as told by your doctor. GET HELP IF:  You think you are having a reaction to the medicine you are taking.  You have repeat headaches or feel dizzy.  You have puffiness (swelling) in your ankles.  You have trouble with your vision. GET HELP RIGHT AWAY IF:   You get a very bad headache and are confused.  You feel weak, numb, or faint.  You get chest or belly (abdominal) pain.  You throw up (vomit).  You cannot breathe very well. MAKE SURE YOU:   Understand these instructions.  Will watch your condition.  Will get help right away if you are not doing well or get worse.   This information is not intended to replace advice given to you by your health care provider. Make sure you discuss any questions you have with your health care provider.   Document Released: 01/08/2008 Document Revised: 07/27/2013 Document Reviewed: 05/14/2013 Elsevier Interactive Patient Education 2016 Elsevier Inc.   Otitis Media, Adult Otitis media is redness, soreness, and puffiness (swelling) in the space just behind your eardrum (middle ear). It may be caused by allergies or infection. It often happens along with a cold. HOME CARE  Take your medicine as told. Finish it even if you  start to feel better.  Only take over-the-counter or prescription medicines for pain, discomfort, or fever as told by your doctor.  Follow up with your doctor as told. GET HELP IF:  You have otitis media only in one ear, or bleeding from your nose, or both.  You notice a lump on your neck.  You are not getting better in 3-5 days.  You feel worse instead of better. GET HELP RIGHT AWAY IF:   You have pain that is not helped with medicine.  You have puffiness, redness, or pain around your ear.  You get a stiff neck.  You cannot move part of your face (paralysis).  You notice that the bone behind your ear hurts when you touch it. MAKE SURE YOU:   Understand these instructions.  Will watch your condition.  Will get help right away if you are not doing well or get worse.   This information is not intended to replace advice given to you by your health care provider. Make sure you discuss any questions you have with your health care provider.   Document Released: 01/08/2008 Document Revised: 08/12/2014 Document Reviewed: 02/16/2013 Elsevier Interactive Patient Education [employer].

## 2015-09-16 NOTE — ED Provider Notes (Signed)
[facility] Emergency Department Provider Note   ____________________________________________  Time seen: ~1540  I have reviewed the triage vital signs and the nursing notes.   HISTORY  Chief Complaint Hypertension   History limited by: Not Limited   HPI Dawn Mcintyre is a 82 y.o. female who presented to the emergency department today because of concerns for high blood pressure as well as possible ear infection. Patient states that she started having some pain in her left ear yesterday. She additionally felt somewhat dizzy and lightheaded. She went to the pharmacy and noted that her blood pressure was elevated in the 170s. She talked to the pharmacist who did not recommend any decongestants given that her blood pressure was high. She states that she has primarily had left ear pain. It does come and go. It will be sharp. She also has some pain in her face underneath her eyes bilaterally. She denies any fevers. Denies any chest pain or shortness breath. States she gets occasional pain in her left arm, roughly once a week. She thinks this might be related to her known arthritis.    Past Medical History  Diagnosis Date  . Arthritis   . Ulcer   . Pneumonia      x 2yrs ago  . Hyperlipidemia   . Hypertension   . Diabetes mellitus     border line  . Cataract   . Osteoporosis   . Atrophic vaginitis 11/16/2014    Patient Active Problem List   Diagnosis Date Noted  . Atrophic vaginitis 11/16/2014    Past Surgical History  Procedure Laterality Date  . Throat surgery      x 10 yrs ago. throat mass.  . Abdominal hysterectomy  1972  . Eye surgery      Current Outpatient Rx  Name  Route  Sig  Dispense  Refill  . ALPRAZolam (XANAX) 0.25 MG tablet   Oral   Take 0.25 mg by mouth at bedtime as needed.           Marland Kitchen aspirin 81 MG tablet   Oral   Take 81 mg by mouth daily.           Marland Kitchen conjugated estrogens (PREMARIN) vaginal cream      Use 1 gram  per vagina every other night.   42.5 g   12   . Cranberry 400 MG CAPS   Oral   Take 1 tablet by mouth 2 (two) times daily.         . cyanocobalamin (,VITAMIN B-12,) 1000 MCG/ML injection   Intramuscular   Inject 1 mL into the muscle every 30 (thirty) days.         . fexofenadine (ALLEGRA) 180 MG tablet   Oral   Take 1 tablet by mouth as needed.         . fluticasone (FLONASE) 50 MCG/ACT nasal spray   Nasal   Place 2 sprays into the nose daily.         Marland Kitchen gabapentin (NEURONTIN) 100 MG capsule   Oral   Take 100 mg by mouth 3 (three) times daily.           . isosorbide mononitrate (IMDUR) 60 MG 24 hr tablet   Oral   Take 60 mg by mouth daily.           . metoprolol (LOPRESSOR) 100 MG tablet   Oral   Take 100 mg by mouth once.           Marland Kitchen  ondansetron (ZOFRAN) 4 MG tablet      Take 1-2 tabs by mouth every 8 hours as needed for nausea/vomiting   30 tablet   0   . pantoprazole (PROTONIX) 40 MG tablet   Oral   Take 1 tablet by mouth daily.         . predniSONE (DELTASONE) 2.5 MG tablet   Oral   Take 2.5 mg by mouth 2 (two) times a week.           . valsartan-hydrochlorothiazide (DIOVAN-HCT) 160-12.5 MG per tablet   Oral   Take 1 tablet by mouth 2 (two) times daily.             Allergies Amoxicillin-pot clavulanate; Codeine; Hydrocodone; Metronidazole; Propoxyphene; and Latex  Family History  Problem Relation Age of Onset  . Anuerysm Mother   . Cancer Father     prostrate cancer  . Diabetes Brother   . Hearing loss Brother     Social History Social History  Substance Use Topics  . Smoking status: Never Smoker   . Smokeless tobacco: Never Used  . Alcohol Use: No    Review of Systems  Constitutional: Negative for fever. Cardiovascular: Negative for chest pain. Respiratory: Negative for shortness of breath. Gastrointestinal: Negative for abdominal pain, vomiting and diarrhea. Neurological: Negative for headaches, focal weakness or  numbness.  10-point ROS otherwise negative.  ____________________________________________   PHYSICAL EXAM:  VITAL SIGNS: ED Triage Vitals  Enc Vitals Group     BP 09/16/15 1152 134/75 mmHg     Pulse Rate 09/16/15 1152 61     Resp 09/16/15 1152 18     Temp 09/16/15 1152 98.2 F (36.8 C)     Temp Source 09/16/15 1152 Oral     SpO2 09/16/15 1152 96 %     Weight 09/16/15 1152 145 lb (65.772 kg)     Height 09/16/15 1152 5\' 6"  (1.676 m)     Head Cir --      Peak Flow --      Pain Score 09/16/15 1153 6   Constitutional: Alert and oriented. Well appearing and in no distress. Eyes: Conjunctivae are normal. PERRL. Normal extraocular movements. ENT   Head: Normocephalic and atraumatic. Slightly more ceruminous left external auditory canal. Question tympanic membrane erythema.   Nose: No congestion/rhinnorhea.   Mouth/Throat: Mucous membranes are moist.   Neck: No stridor. Hematological/Lymphatic/Immunilogical: No cervical lymphadenopathy. Cardiovascular: Normal rate, regular rhythm.  No murmurs, rubs, or gallops. Respiratory: Normal respiratory effort without tachypnea nor retractions. Breath sounds are clear and equal bilaterally. No wheezes/rales/rhonchi. Gastrointestinal: Soft and nontender. No distention. There is no CVA tenderness. Genitourinary: Deferred Musculoskeletal: Normal range of motion in all extremities. No joint effusions.  No lower extremity tenderness nor edema. Neurologic:  Normal speech and language. No gross focal neurologic deficits are appreciated.  Skin:  Skin is warm, dry and intact. No rash noted. Psychiatric: Mood and affect are normal. Speech and behavior are normal. Patient exhibits appropriate insight and judgment.  ____________________________________________    LABS (pertinent positives/negatives)  Labs Reviewed  COMPREHENSIVE METABOLIC PANEL - Abnormal; Notable for the following:    Glucose, Bld 122 (*)    Creatinine, Ser 1.02 (*)     ALT 12 (*)    GFR calc non Af Amer 51 (*)    GFR calc Af Amer 59 (*)    All other components within normal limits  CBC  TROPONIN I     ____________________________________________   EKG  I,  Nance Pear, attending physician, personally viewed and interpreted this EKG  EKG Time: 1208 Rate: 58 Rhythm: sinus bradycardia Axis: normal Intervals: qtc 416 QRS: narrow, q waves V1 ST changes: no st elevation Impression: abnormal ekg   ____________________________________________    RADIOLOGY  None  ____________________________________________   PROCEDURES  Procedure(s) performed: None  Critical Care performed: No  ____________________________________________   INITIAL IMPRESSION / ASSESSMENT AND PLAN / ED COURSE  Pertinent labs & imaging results that were available during my care of the patient were reviewed by me and considered in my medical decision making (see chart for details).  This presented to the emergency department today because of concerns for high blood pressure and left ear pain. Patient's blood pressure back to her baseline at triage. Left ear with some possible panic membrane erythema however exam is somewhat limited secondary to cerumen. Given the patient's complaint of ear pain however will treat for infection. Discussed with patient importance of following up with primary care doctor. Patient states she will try to contact them Monday.  ____________________________________________   FINAL CLINICAL IMPRESSION(S) / ED DIAGNOSES  Final diagnoses:  Ear infection  Secondary hypertension, unspecified     Nance Pear, MD 09/16/15 1557

## 2015-09-18 ENCOUNTER — Emergency Department: Payer: Medicare Other

## 2015-09-18 ENCOUNTER — Encounter: Payer: Self-pay | Admitting: Urgent Care

## 2015-09-18 DIAGNOSIS — Z7952 Long term (current) use of systemic steroids: Secondary | ICD-10-CM | POA: Diagnosis not present

## 2015-09-18 DIAGNOSIS — R200 Anesthesia of skin: Secondary | ICD-10-CM | POA: Diagnosis not present

## 2015-09-18 DIAGNOSIS — Z880 Allergy status to penicillin: Secondary | ICD-10-CM | POA: Insufficient documentation

## 2015-09-18 DIAGNOSIS — Z792 Long term (current) use of antibiotics: Secondary | ICD-10-CM | POA: Diagnosis not present

## 2015-09-18 DIAGNOSIS — I10 Essential (primary) hypertension: Secondary | ICD-10-CM | POA: Insufficient documentation

## 2015-09-18 DIAGNOSIS — Z7982 Long term (current) use of aspirin: Secondary | ICD-10-CM | POA: Insufficient documentation

## 2015-09-18 DIAGNOSIS — Z91040 Latex allergy status: Secondary | ICD-10-CM | POA: Diagnosis not present

## 2015-09-18 DIAGNOSIS — Z79899 Other long term (current) drug therapy: Secondary | ICD-10-CM | POA: Diagnosis not present

## 2015-09-18 DIAGNOSIS — R51 Headache: Secondary | ICD-10-CM | POA: Insufficient documentation

## 2015-09-18 DIAGNOSIS — R202 Paresthesia of skin: Secondary | ICD-10-CM | POA: Insufficient documentation

## 2015-09-18 LAB — CBC
HEMATOCRIT: 40.1 % (ref 35.0–47.0)
Hemoglobin: 13.3 g/dL (ref 12.0–16.0)
MCH: 30 pg (ref 26.0–34.0)
MCHC: 33.1 g/dL (ref 32.0–36.0)
MCV: 90.5 fL (ref 80.0–100.0)
Platelets: 219 10*3/uL (ref 150–440)
RBC: 4.43 MIL/uL (ref 3.80–5.20)
RDW: 13.8 % (ref 11.5–14.5)
WBC: 8.1 10*3/uL (ref 3.6–11.0)

## 2015-09-18 LAB — BASIC METABOLIC PANEL
Anion gap: 9 (ref 5–15)
BUN: 29 mg/dL — AB (ref 6–20)
CO2: 29 mmol/L (ref 22–32)
Calcium: 10.4 mg/dL — ABNORMAL HIGH (ref 8.9–10.3)
Chloride: 102 mmol/L (ref 101–111)
Creatinine, Ser: 1.18 mg/dL — ABNORMAL HIGH (ref 0.44–1.00)
GFR calc Af Amer: 49 mL/min — ABNORMAL LOW (ref >60)
GFR, EST NON AFRICAN AMERICAN: 42 mL/min — AB (ref >60)
GLUCOSE: 108 mg/dL — AB (ref 65–99)
POTASSIUM: 4.2 mmol/L (ref 3.5–5.1)
Sodium: 140 mmol/L (ref 135–145)

## 2015-09-18 LAB — TROPONIN I: Troponin I: 0.03 ng/mL (ref <0.031)

## 2015-09-18 NOTE — ED Notes (Signed)
Spoke with Jacqualine Code, MD regarding presenting c/o. MD with [facility] for cardiac protocol and head CT. Orders to be entered by this RN.

## 2015-09-18 NOTE — ED Notes (Addendum)
Patient presents to ED with c/o HYPERtension, headache, and LUE numbness that began today. Of note, patient was seen here on Saturday for similar symptoms. Patient afraid to go to sleep tonight secondary to fears of something happening; mother died from a cerebral aneurysm. Pain comes in waves. (+) vertiginous symptoms with (+) nausea reported.

## 2015-09-19 ENCOUNTER — Emergency Department
Admission: EM | Admit: 2015-09-19 | Discharge: 2015-09-19 | Disposition: A | Discharge status: Home / Self Care | Payer: Medicare Other | Attending: Emergency Medicine | Admitting: Emergency Medicine

## 2015-09-19 DIAGNOSIS — R519 Headache, unspecified: Secondary | ICD-10-CM

## 2015-09-19 DIAGNOSIS — R51 Headache: Secondary | ICD-10-CM

## 2015-09-19 DIAGNOSIS — I10 Essential (primary) hypertension: Secondary | ICD-10-CM

## 2015-09-19 MED ORDER — CLONIDINE HCL 0.1 MG PO TABS
0.1000 mg | ORAL_TABLET | Freq: Once | ORAL | Status: AC
  Administered 2015-09-19: 0.1 mg via ORAL
  Filled 2015-09-19: qty 1

## 2015-09-19 MED ORDER — NITROGLYCERIN 0.4 MG SL SUBL
0.4000 mg | SUBLINGUAL_TABLET | Freq: Once | SUBLINGUAL | Status: AC
  Administered 2015-09-19: 0.4 mg via SUBLINGUAL
  Filled 2015-09-19: qty 1

## 2015-09-19 NOTE — Discharge Instructions (Signed)
General Headache Without Cause °A headache is pain or discomfort felt around the head or neck area. The specific cause of a headache may not be found. There are many causes and types of headaches. A few common ones are: °· Tension headaches. °· Migraine headaches. °· Cluster headaches. °· Chronic daily headaches. °HOME CARE INSTRUCTIONS  °Watch your condition for any changes. Take these steps to help with your condition: °Managing Pain °· Take over-the-counter and prescription medicines only as told by your health care provider. °· Lie down in a dark, quiet room when you have a headache. °· If directed, apply ice to the head and neck area: °· Put ice in a plastic bag. °· Place a towel between your skin and the bag. °· Leave the ice on for 20 minutes, 2-3 times per day. °· Use a heating pad or hot shower to apply heat to the head and neck area as told by your health care provider. °· Keep lights dim if bright lights bother you or make your headaches worse. °Eating and Drinking °· Eat meals on a regular schedule. °· Limit alcohol use. °· Decrease the amount of caffeine you drink, or stop drinking caffeine. °General Instructions °· Keep all follow-up visits as told by your health care provider. This is important. °· Keep a headache journal to help find out what may trigger your headaches. For example, write down: °· What you eat and drink. °· How much sleep you get. °· Any change to your diet or medicines. °· Try massage or other relaxation techniques. °· Limit stress. °· Sit up straight, and do not tense your muscles. °· Do not use tobacco products, including cigarettes, chewing tobacco, or e-cigarettes. If you need help quitting, ask your health care provider. °· Exercise regularly as told by your health care provider. °· Sleep on a regular schedule. Get 7-9 hours of sleep, or the amount recommended by your health care provider. °SEEK MEDICAL CARE IF:  °· Your symptoms are not helped by medicine. °· You have a  headache that is different from the usual headache. °· You have nausea or you vomit. °· You have a fever. °SEEK IMMEDIATE MEDICAL CARE IF:  °· Your headache becomes severe. °· You have repeated vomiting. °· You have a stiff neck. °· You have a loss of vision. °· You have problems with speech. °· You have pain in the eye or ear. °· You have muscular weakness or loss of muscle control. °· You lose your balance or have trouble walking. °· You feel faint or pass out. °· You have confusion. °  °This information is not intended to replace advice given to you by your health care provider. Make sure you discuss any questions you have with your health care provider. °  °Document Released: 07/22/2005 Document Revised: 04/12/2015 Document Reviewed: 11/14/2014 °Elsevier Interactive Patient Education ©2016 Elsevier Inc. ° °Hypertension °Hypertension, commonly called high blood pressure, is when the force of blood pumping through your arteries is too strong. Your arteries are the blood vessels that carry blood from your heart throughout your body. A blood pressure reading consists of a higher number over a lower number, such as 110/72. The higher number (systolic) is the pressure inside your arteries when your heart pumps. The lower number (diastolic) is the pressure inside your arteries when your heart relaxes. Ideally you want your blood pressure below 120/80. °Hypertension forces your heart to work harder to pump blood. Your arteries may become narrow or stiff. Having untreated or   uncontrolled hypertension can cause heart attack, stroke, kidney disease, and other problems. °RISK FACTORS °Some risk factors for high blood pressure are controllable. Others are not.  °Risk factors you cannot control include:  °· Race. You may be at higher risk if you are African American. °· Age. Risk increases with age. °· Gender. Men are at higher risk than women before age 45 years. After age 65, women are at higher risk than men. °Risk factors  you can control include: °· Not getting enough exercise or physical activity. °· Being overweight. °· Getting too much fat, sugar, calories, or salt in your diet. °· Drinking too much alcohol. °SIGNS AND SYMPTOMS °Hypertension does not usually cause signs or symptoms. Extremely high blood pressure (hypertensive crisis) may cause headache, anxiety, shortness of breath, and nosebleed. °DIAGNOSIS °To check if you have hypertension, your health care provider will measure your blood pressure while you are seated, with your arm held at the level of your heart. It should be measured at least twice using the same arm. Certain conditions can cause a difference in blood pressure between your right and left arms. A blood pressure reading that is higher than normal on one occasion does not mean that you need treatment. If it is not clear whether you have high blood pressure, you may be asked to return on a different day to have your blood pressure checked again. Or, you may be asked to monitor your blood pressure at home for 1 or more weeks. °TREATMENT °Treating high blood pressure includes making lifestyle changes and possibly taking medicine. Living a healthy lifestyle can help lower high blood pressure. You may need to change some of your habits. °Lifestyle changes may include: °· Following the DASH diet. This diet is high in fruits, vegetables, and whole grains. It is low in salt, red meat, and added sugars. °· Keep your sodium intake below 2,300 mg per day. °· Getting at least 30-45 minutes of aerobic exercise at least 4 times per week. °· Losing weight if necessary. °· Not smoking. °· Limiting alcoholic beverages. °· Learning ways to reduce stress. °Your health care provider may prescribe medicine if lifestyle changes are not enough to get your blood pressure under control, and if one of the following is true: °· You are 18-59 years of age and your systolic blood pressure is above 140. °· You are 60 years of age or older,  and your systolic blood pressure is above 150. °· Your diastolic blood pressure is above 90. °· You have diabetes, and your systolic blood pressure is over 140 or your diastolic blood pressure is over 90. °· You have kidney disease and your blood pressure is above 140/90. °· You have heart disease and your blood pressure is above 140/90. °Your personal target blood pressure may vary depending on your medical conditions, your age, and other factors. °HOME CARE INSTRUCTIONS °· Have your blood pressure rechecked as directed by your health care provider.   °· Take medicines only as directed by your health care provider. Follow the directions carefully. Blood pressure medicines must be taken as prescribed. The medicine does not work as well when you skip doses. Skipping doses also puts you at risk for problems. °· Do not smoke.   °· Monitor your blood pressure at home as directed by your health care provider.  °SEEK MEDICAL CARE IF:  °· You think you are having a reaction to medicines taken. °· You have recurrent headaches or feel dizzy. °· You have swelling in your   ankles. °· You have trouble with your vision. °SEEK IMMEDIATE MEDICAL CARE IF: °· You develop a severe headache or confusion. °· You have unusual weakness, numbness, or feel faint. °· You have severe chest or abdominal pain. °· You vomit repeatedly. °· You have trouble breathing. °MAKE SURE YOU:  °· Understand these instructions. °· Will watch your condition. °· Will get help right away if you are not doing well or get worse. °  °This information is not intended to replace advice given to you by your health care provider. Make sure you discuss any questions you have with your health care provider. °  °Document Released: 07/22/2005 Document Revised: 12/06/2014 Document Reviewed: 05/14/2013 °Elsevier Interactive Patient Education ©2016 Elsevier Inc. ° °

## 2015-09-19 NOTE — ED Provider Notes (Signed)
[facility] Emergency Department Provider Note  ____________________________________________  Time seen: Approximately 111 AM  I have reviewed the triage vital signs and the nursing notes.   HISTORY  Chief Complaint Headache; Hypertension; and Tingling    HPI Dawn Mcintyre is a 82 y.o. female who comes into the hospital today because her blood pressure is elevated. The patient was here Saturday after being here for 2-3 hours her blood pressure went down and she was discharged home. She reports that today the top number is still high. The patient takes metoprolol and isosorbide. She reports that she has not missed any medications and she last saw her primary care physician a month ago. The patient planned to see her physician tomorrow but was concerned because of blood pressure was still up area the patient reports that she did have some sharp pains go up and down her head as well as in the front of her head. She felt that it was heavy across the front. The patient did not take any medication but decided just to come into the hospital to get checked out. The patient reports that she did have some numbness in her left arm further down that she also had when she was here previously. The patient was concerned so she came in to get checked out. At this point the patient's headache is gone and the numbness is improved. The patient is here to evaluate her blood pressure.The patient did have some dizziness which is also improved at this time.   Past Medical History  Diagnosis Date  . Arthritis   . Ulcer   . Pneumonia      x 70yrs ago  . Hyperlipidemia   . Hypertension   . Diabetes mellitus     border line  . Cataract   . Osteoporosis   . Atrophic vaginitis 11/16/2014    Patient Active Problem List   Diagnosis Date Noted  . Atrophic vaginitis 11/16/2014    Past Surgical History  Procedure Laterality Date  . Throat surgery      x 10 yrs ago. throat mass.  .  Abdominal hysterectomy  1972  . Eye surgery      Current Outpatient Rx  Name  Route  Sig  Dispense  Refill  . aspirin 81 MG tablet   Oral   Take 81 mg by mouth daily.           . isosorbide mononitrate (IMDUR) 60 MG 24 hr tablet   Oral   Take 60 mg by mouth daily.           . metoprolol (TOPROL-XL) 200 MG 24 hr tablet   Oral   Take 200 mg by mouth daily.         . pantoprazole (PROTONIX) 40 MG tablet   Oral   Take 1 tablet by mouth daily.         . predniSONE (DELTASONE) 2.5 MG tablet   Oral   Take 2.5 mg by mouth daily.          . valsartan-hydrochlorothiazide (DIOVAN-HCT) 160-12.5 MG per tablet   Oral   Take 1 tablet by mouth daily.          Marland Kitchen ALPRAZolam (XANAX) 0.25 MG tablet   Oral   Take 0.25 mg by mouth at bedtime as needed.           Marland Kitchen azithromycin (ZITHROMAX Z-PAK) 250 MG tablet      Take 2 tablets (500 mg) on  Day 1,  followed by 1 tablet (250 mg) once daily on Days 2 through 5.   6 each   0   . conjugated estrogens (PREMARIN) vaginal cream      Use 1 gram per vagina every other night.   42.5 g   12   . Cranberry 400 MG CAPS   Oral   Take 1 tablet by mouth 2 (two) times daily.         . cyanocobalamin (,VITAMIN B-12,) 1000 MCG/ML injection   Intramuscular   Inject 1 mL into the muscle every 30 (thirty) days.         . fexofenadine (ALLEGRA) 180 MG tablet   Oral   Take 1 tablet by mouth as needed.         . fluticasone (FLONASE) 50 MCG/ACT nasal spray   Nasal   Place 2 sprays into the nose daily.         Marland Kitchen gabapentin (NEURONTIN) 100 MG capsule   Oral   Take 100 mg by mouth 3 (three) times daily.           . metoprolol (LOPRESSOR) 100 MG tablet   Oral   Take 100 mg by mouth once.           . ondansetron (ZOFRAN) 4 MG tablet      Take 1-2 tabs by mouth every 8 hours as needed for nausea/vomiting   30 tablet   0     Allergies Amoxicillin-pot clavulanate; Codeine; Hydrocodone; Metronidazole; Propoxyphene; and  Latex  Family History  Problem Relation Age of Onset  . Anuerysm Mother   . Cancer Father     prostrate cancer  . Diabetes Brother   . Hearing loss Brother     Social History Social History  Substance Use Topics  . Smoking status: Never Smoker   . Smokeless tobacco: Never Used  . Alcohol Use: No    Review of Systems Constitutional: No fever/chills Eyes: No visual changes. ENT: No sore throat. Cardiovascular: Denies chest pain. Respiratory: Denies shortness of breath. Gastrointestinal: No abdominal pain.  No nausea, no vomiting.  No diarrhea.  No constipation. Genitourinary: Negative for dysuria. Musculoskeletal: Negative for back pain. Skin: Negative for rash. Neurological: Headache and dizziness, left arm numbness  10-point ROS otherwise negative.  ____________________________________________   PHYSICAL EXAM:  VITAL SIGNS: ED Triage Vitals  Enc Vitals Group     BP 09/18/15 2105 193/79 mmHg     Pulse Rate 09/18/15 2105 64     Resp 09/18/15 2105 14     Temp 09/18/15 2105 98 F (36.7 C)     Temp src --      SpO2 09/18/15 2105 96 %     Weight 09/18/15 2105 145 lb (65.772 kg)     Height --      Head Cir --      Peak Flow --      Pain Score 09/18/15 2105 0     Pain Loc --      Pain Edu? --      Excl. in [place]? --     Constitutional: Alert and oriented. Well appearing and in no acute distress. Eyes: Conjunctivae are normal. PERRL. EOMI. Head: Atraumatic. Nose: No congestion/rhinnorhea. Mouth/Throat: Mucous membranes are moist.  Oropharynx non-erythematous. Cardiovascular: Normal rate, regular rhythm. Grossly normal heart sounds.  Good peripheral circulation. Respiratory: Normal respiratory effort.  No retractions. Lungs CTAB. Gastrointestinal: Soft and nontender. No distention. Positive bowel sounds Musculoskeletal: No lower extremity  tenderness nor edema.   Neurologic:  Normal speech and language. No gross focal neurologic deficits are appreciated. Radial  nerves II through XII are grossly intact Skin:  Skin is warm, dry and intact.  Psychiatric: Mood and affect are normal.   ____________________________________________   LABS (all labs ordered are listed, but only abnormal results are displayed)  Labs Reviewed  BASIC METABOLIC PANEL - Abnormal; Notable for the following:    Glucose, Bld 108 (*)    BUN 29 (*)    Creatinine, Ser 1.18 (*)    Calcium 10.4 (*)    GFR calc non Af Amer 42 (*)    GFR calc Af Amer 49 (*)    All other components within normal limits  CBC  TROPONIN I   ____________________________________________  EKG  ED ECG REPORT I, Loney Hering, the attending physician, personally viewed and interpreted this ECG.   Date: 09/18/2015  EKG Time: 2109  Rate: 62  Rhythm: normal sinus rhythm  Axis: normal  Intervals:none  ST&T Change: normal  ____________________________________________  RADIOLOGY  CT head: No acute intracranial hemorrhage, age-related atrophy and chronic microvascular ischemic disease. ____________________________________________   PROCEDURES  Procedure(s) performed: None  Critical Care performed: No  ____________________________________________   INITIAL IMPRESSION / ASSESSMENT AND PLAN / ED COURSE  Pertinent labs & imaging results that were available during my care of the patient were reviewed by me and considered in my medical decision making (see chart for details).  The patient is an 82 year old female who comes into the hospital today with elevated blood pressure and a headache. The patient also reports that she did have some numbness in her arm that she has had in the past but it is gone now. The patient did not receive any medication for her headache or her numbness and that did improve. She reports that she thinks she may have some sinus disease and that she may also have some arthritis in her arm or neck. I did give the patient a dose of nitroglycerin and clonidine to  help with her blood pressure as it did go past 200. After some time in the emergency department the patient's blood pressure was improved. She told the nurse that she was ready to be discharged home because she did not want to stay her all day. The patient will be seeing her primary care physician she reports tomorrow. The patient will be discharged home to follow-up with her primary care physician. ____________________________________________   FINAL CLINICAL IMPRESSION(S) / ED DIAGNOSES  Final diagnoses:  Essential hypertension  Acute nonintractable headache, unspecified headache type      Loney Hering, MD 09/19/15 [phone]

## 2015-10-02 ENCOUNTER — Other Ambulatory Visit: Payer: Self-pay | Admitting: Physician Assistant

## 2015-10-02 ENCOUNTER — Ambulatory Visit
Admission: RE | Admit: 2015-10-02 | Discharge: 2015-10-02 | Disposition: A | Discharge status: Home / Self Care | Payer: Medicare Other | Source: Ambulatory Visit | Attending: Physician Assistant | Admitting: Physician Assistant

## 2015-10-02 DIAGNOSIS — G44319 Acute post-traumatic headache, not intractable: Secondary | ICD-10-CM

## 2015-10-15 ENCOUNTER — Observation Stay
Admission: EM | Admit: 2015-10-15 | Discharge: 2015-10-17 | Disposition: A | Discharge status: Home / Self Care | Payer: Medicare Other | Attending: Internal Medicine | Admitting: Internal Medicine

## 2015-10-15 ENCOUNTER — Emergency Department: Payer: Medicare Other

## 2015-10-15 DIAGNOSIS — Z8042 Family history of malignant neoplasm of prostate: Secondary | ICD-10-CM | POA: Diagnosis not present

## 2015-10-15 DIAGNOSIS — Z8249 Family history of ischemic heart disease and other diseases of the circulatory system: Secondary | ICD-10-CM | POA: Diagnosis not present

## 2015-10-15 DIAGNOSIS — Z885 Allergy status to narcotic agent status: Secondary | ICD-10-CM | POA: Diagnosis not present

## 2015-10-15 DIAGNOSIS — E785 Hyperlipidemia, unspecified: Secondary | ICD-10-CM | POA: Diagnosis not present

## 2015-10-15 DIAGNOSIS — Z91040 Latex allergy status: Secondary | ICD-10-CM | POA: Diagnosis not present

## 2015-10-15 DIAGNOSIS — R1010 Upper abdominal pain, unspecified: Secondary | ICD-10-CM | POA: Diagnosis not present

## 2015-10-15 DIAGNOSIS — Z90710 Acquired absence of both cervix and uterus: Secondary | ICD-10-CM | POA: Insufficient documentation

## 2015-10-15 DIAGNOSIS — I10 Essential (primary) hypertension: Secondary | ICD-10-CM | POA: Insufficient documentation

## 2015-10-15 DIAGNOSIS — E119 Type 2 diabetes mellitus without complications: Secondary | ICD-10-CM | POA: Diagnosis not present

## 2015-10-15 DIAGNOSIS — J90 Pleural effusion, not elsewhere classified: Secondary | ICD-10-CM | POA: Diagnosis not present

## 2015-10-15 DIAGNOSIS — R531 Weakness: Secondary | ICD-10-CM | POA: Diagnosis not present

## 2015-10-15 DIAGNOSIS — J189 Pneumonia, unspecified organism: Secondary | ICD-10-CM

## 2015-10-15 DIAGNOSIS — Z9109 Other allergy status, other than to drugs and biological substances: Secondary | ICD-10-CM | POA: Insufficient documentation

## 2015-10-15 DIAGNOSIS — M810 Age-related osteoporosis without current pathological fracture: Secondary | ICD-10-CM | POA: Insufficient documentation

## 2015-10-15 DIAGNOSIS — J101 Influenza due to other identified influenza virus with other respiratory manifestations: Secondary | ICD-10-CM | POA: Diagnosis not present

## 2015-10-15 DIAGNOSIS — Z8489 Family history of other specified conditions: Secondary | ICD-10-CM | POA: Diagnosis not present

## 2015-10-15 DIAGNOSIS — R110 Nausea: Secondary | ICD-10-CM | POA: Insufficient documentation

## 2015-10-15 DIAGNOSIS — N390 Urinary tract infection, site not specified: Secondary | ICD-10-CM | POA: Diagnosis present

## 2015-10-15 DIAGNOSIS — J111 Influenza due to unidentified influenza virus with other respiratory manifestations: Secondary | ICD-10-CM | POA: Diagnosis present

## 2015-10-15 DIAGNOSIS — R05 Cough: Secondary | ICD-10-CM | POA: Diagnosis not present

## 2015-10-15 DIAGNOSIS — R197 Diarrhea, unspecified: Secondary | ICD-10-CM | POA: Insufficient documentation

## 2015-10-15 DIAGNOSIS — R55 Syncope and collapse: Secondary | ICD-10-CM | POA: Diagnosis not present

## 2015-10-15 DIAGNOSIS — I959 Hypotension, unspecified: Secondary | ICD-10-CM | POA: Insufficient documentation

## 2015-10-15 DIAGNOSIS — Z833 Family history of diabetes mellitus: Secondary | ICD-10-CM | POA: Diagnosis not present

## 2015-10-15 DIAGNOSIS — N952 Postmenopausal atrophic vaginitis: Secondary | ICD-10-CM | POA: Diagnosis not present

## 2015-10-15 DIAGNOSIS — M1990 Unspecified osteoarthritis, unspecified site: Secondary | ICD-10-CM | POA: Diagnosis not present

## 2015-10-15 DIAGNOSIS — Z881 Allergy status to other antibiotic agents status: Secondary | ICD-10-CM | POA: Diagnosis not present

## 2015-10-15 LAB — COMPREHENSIVE METABOLIC PANEL
ALK PHOS: 59 U/L (ref 38–126)
ALT: 17 U/L (ref 14–54)
ANION GAP: 9 (ref 5–15)
AST: 35 U/L (ref 15–41)
Albumin: 3.9 g/dL (ref 3.5–5.0)
BILIRUBIN TOTAL: 0.8 mg/dL (ref 0.3–1.2)
BUN: 20 mg/dL (ref 6–20)
CHLORIDE: 95 mmol/L — AB (ref 101–111)
CO2: 28 mmol/L (ref 22–32)
CREATININE: 1.25 mg/dL — AB (ref 0.44–1.00)
Calcium: 9.2 mg/dL (ref 8.9–10.3)
GFR calc Af Amer: 46 mL/min — ABNORMAL LOW (ref >60)
GFR, EST NON AFRICAN AMERICAN: 40 mL/min — AB (ref >60)
GLUCOSE: 145 mg/dL — AB (ref 65–99)
Potassium: 3.2 mmol/L — ABNORMAL LOW (ref 3.5–5.1)
Sodium: 132 mmol/L — ABNORMAL LOW (ref 135–145)
Total Protein: 7 g/dL (ref 6.5–8.1)

## 2015-10-15 LAB — RAPID INFLUENZA A&B ANTIGENS
Influenza A (ARMC): POSITIVE — AB
Influenza B (ARMC): NEGATIVE

## 2015-10-15 LAB — CBC WITH DIFFERENTIAL/PLATELET
Basophils Absolute: 0.1 10*3/uL (ref 0–0.1)
Basophils Relative: 1 %
Eosinophils Absolute: 0 10*3/uL (ref 0–0.7)
Eosinophils Relative: 0 %
HEMATOCRIT: 37.3 % (ref 35.0–47.0)
HEMOGLOBIN: 12.7 g/dL (ref 12.0–16.0)
LYMPHS PCT: 13 %
Lymphs Abs: 0.9 10*3/uL — ABNORMAL LOW (ref 1.0–3.6)
MCH: 31.1 pg (ref 26.0–34.0)
MCHC: 33.9 g/dL (ref 32.0–36.0)
MCV: 91.7 fL (ref 80.0–100.0)
MONO ABS: 1 10*3/uL — AB (ref 0.2–0.9)
MONOS PCT: 15 %
NEUTROS ABS: 4.9 10*3/uL (ref 1.4–6.5)
Neutrophils Relative %: 71 %
Platelets: 164 10*3/uL (ref 150–440)
RBC: 4.07 MIL/uL (ref 3.80–5.20)
RDW: 13.3 % (ref 11.5–14.5)
WBC: 6.9 10*3/uL (ref 3.6–11.0)

## 2015-10-15 LAB — URINALYSIS COMPLETE WITH MICROSCOPIC (ARMC ONLY)
BILIRUBIN URINE: NEGATIVE
Glucose, UA: NEGATIVE mg/dL
KETONES UR: NEGATIVE mg/dL
NITRITE: NEGATIVE
PH: 6 (ref 5.0–8.0)
PROTEIN: 30 mg/dL — AB
SPECIFIC GRAVITY, URINE: 1.012 (ref 1.005–1.030)

## 2015-10-15 LAB — TROPONIN I: Troponin I: <0.03 ng/mL (ref <0.031)

## 2015-10-15 LAB — LIPASE, BLOOD: LIPASE: 18 U/L (ref 11–51)

## 2015-10-15 LAB — GLUCOSE, CAPILLARY: GLUCOSE-CAPILLARY: 105 mg/dL — AB (ref 65–99)

## 2015-10-15 LAB — LACTIC ACID, PLASMA
LACTIC ACID, VENOUS: 1.6 mmol/L (ref 0.5–2.0)
LACTIC ACID, VENOUS: 1.3 mmol/L (ref 0.5–2.0)

## 2015-10-15 MED ORDER — DEXTROSE 5 % IV SOLN: 1.0000 g | Freq: Once | INTRAVENOUS | Status: DC

## 2015-10-15 MED ORDER — ASPIRIN 81 MG PO TABS
81.0000 mg | ORAL_TABLET | Freq: Every day | ORAL | Status: DC
  Filled 2015-10-15: qty 1

## 2015-10-15 MED ORDER — LORATADINE 10 MG PO TABS
10.0000 mg | ORAL_TABLET | Freq: Every day | ORAL | Status: DC
  Administered 2015-10-16 – 2015-10-17 (×2): 10 mg via ORAL
  Filled 2015-10-15 (×2): qty 1

## 2015-10-15 MED ORDER — SODIUM CHLORIDE 0.9 % IV BOLUS (SEPSIS)
1000.0000 mL | Freq: Once | INTRAVENOUS | Status: AC
  Administered 2015-10-15: 1000 mL via INTRAVENOUS

## 2015-10-15 MED ORDER — IBUPROFEN 200 MG PO TABS: 400.0000 mg | ORAL_TABLET | Freq: Four times a day (QID) | ORAL | Status: DC | PRN

## 2015-10-15 MED ORDER — PREDNISONE 5 MG PO TABS
2.5000 mg | ORAL_TABLET | Freq: Every day | ORAL | Status: DC
  Administered 2015-10-16 – 2015-10-17 (×2): 2.5 mg via ORAL
  Filled 2015-10-15 (×2): qty 1

## 2015-10-15 MED ORDER — DEXTROSE 5 % IV SOLN: 500.0000 mg | Freq: Once | INTRAVENOUS | Status: DC

## 2015-10-15 MED ORDER — ALPRAZOLAM 0.25 MG PO TABS: 0.2500 mg | ORAL_TABLET | Freq: Every evening | ORAL | Status: DC | PRN

## 2015-10-15 MED ORDER — ISOSORBIDE MONONITRATE ER 30 MG PO TB24
60.0000 mg | ORAL_TABLET | Freq: Every day | ORAL | Status: DC
  Administered 2015-10-17: 60 mg via ORAL
  Filled 2015-10-15 (×2): qty 2

## 2015-10-15 MED ORDER — INSULIN ASPART 100 UNIT/ML ~~LOC~~ SOLN
0.0000 [IU] | Freq: Three times a day (TID) | SUBCUTANEOUS | Status: DC
  Administered 2015-10-17: 2 [IU] via SUBCUTANEOUS
  Filled 2015-10-15: qty 2

## 2015-10-15 MED ORDER — INSULIN ASPART 100 UNIT/ML ~~LOC~~ SOLN: 0.0000 [IU] | Freq: Every day | SUBCUTANEOUS | Status: DC

## 2015-10-15 MED ORDER — ENOXAPARIN SODIUM 40 MG/0.4ML ~~LOC~~ SOLN
40.0000 mg | SUBCUTANEOUS | Status: DC
  Administered 2015-10-15 – 2015-10-16 (×2): 40 mg via SUBCUTANEOUS
  Filled 2015-10-15 (×2): qty 0.4

## 2015-10-15 MED ORDER — METOPROLOL TARTRATE 50 MG PO TABS
100.0000 mg | ORAL_TABLET | Freq: Two times a day (BID) | ORAL | Status: DC
  Filled 2015-10-15 (×5): qty 2

## 2015-10-15 MED ORDER — OSELTAMIVIR PHOSPHATE 75 MG PO CAPS
75.0000 mg | ORAL_CAPSULE | Freq: Once | ORAL | Status: AC
  Administered 2015-10-15: 75 mg via ORAL
  Filled 2015-10-15: qty 1

## 2015-10-15 MED ORDER — GABAPENTIN 100 MG PO CAPS
100.0000 mg | ORAL_CAPSULE | Freq: Three times a day (TID) | ORAL | Status: DC
  Administered 2015-10-15 – 2015-10-17 (×5): 100 mg via ORAL
  Filled 2015-10-15 (×5): qty 1

## 2015-10-15 MED ORDER — ACETAMINOPHEN 650 MG RE SUPP: 650.0000 mg | Freq: Four times a day (QID) | RECTAL | Status: DC | PRN

## 2015-10-15 MED ORDER — ACETAMINOPHEN 325 MG PO TABS: 650.0000 mg | ORAL_TABLET | Freq: Four times a day (QID) | ORAL | Status: DC | PRN

## 2015-10-15 MED ORDER — OSELTAMIVIR PHOSPHATE 75 MG PO CAPS: 75.0000 mg | ORAL_CAPSULE | Freq: Two times a day (BID) | ORAL | Status: DC

## 2015-10-15 MED ORDER — ONDANSETRON HCL 4 MG/2ML IJ SOLN: 4.0000 mg | Freq: Four times a day (QID) | INTRAMUSCULAR | Status: DC | PRN

## 2015-10-15 MED ORDER — GUAIFENESIN-DM 100-10 MG/5ML PO SYRP
5.0000 mL | ORAL_SOLUTION | ORAL | Status: DC | PRN
  Administered 2015-10-15: 5 mL via ORAL
  Filled 2015-10-15: qty 5

## 2015-10-15 MED ORDER — OSELTAMIVIR PHOSPHATE 30 MG PO CAPS
30.0000 mg | ORAL_CAPSULE | Freq: Two times a day (BID) | ORAL | Status: DC
  Administered 2015-10-16 – 2015-10-17 (×3): 30 mg via ORAL
  Filled 2015-10-15 (×4): qty 1

## 2015-10-15 MED ORDER — ONDANSETRON HCL 4 MG PO TABS: 4.0000 mg | ORAL_TABLET | Freq: Four times a day (QID) | ORAL | Status: DC | PRN

## 2015-10-15 MED ORDER — POLYETHYLENE GLYCOL 3350 17 G PO PACK: 17.0000 g | PACK | Freq: Every day | ORAL | Status: DC | PRN

## 2015-10-15 MED ORDER — PANTOPRAZOLE SODIUM 40 MG PO TBEC
40.0000 mg | DELAYED_RELEASE_TABLET | Freq: Every day | ORAL | Status: DC
  Administered 2015-10-15 – 2015-10-17 (×3): 40 mg via ORAL
  Filled 2015-10-15 (×3): qty 1

## 2015-10-15 MED ORDER — FLUTICASONE PROPIONATE 50 MCG/ACT NA SUSP
2.0000 | Freq: Every day | NASAL | Status: DC
  Administered 2015-10-17: 2 via NASAL
  Filled 2015-10-15: qty 16

## 2015-10-15 NOTE — Care Management Obs Status (Signed)
[facility] NOTIFICATION   Patient Details  Name: Dawn Mcintyre MRN: [id number] Date of Birth: [birth year]   Medicare Observation Status Notification Given:  Yes    Ival Bible, RN 10/15/2015, 7:14 PM

## 2015-10-15 NOTE — ED Notes (Signed)
Pt presents to ED from [facility] clinic with low blood pressure,    She c/o nausea and diarrhea, and severe cough

## 2015-10-15 NOTE — H&P (Signed)
[place] [facility] [place] NAME: Dawn Mcintyre    MR#:  [id number]  DATE OF BIRTH:  Mar 14, 1933  DATE OF ADMISSION:  10/15/2015  PRIMARY CARE PHYSICIAN: Glendon Axe, MD   REQUESTING/REFERRING PHYSICIAN: Dr. Lucita Lora  CHIEF COMPLAINT:   Chief Complaint  Patient presents with  . Weakness  . Diarrhea    HISTORY OF PRESENT ILLNESS:  Dawn Mcintyre  is a 82 y.o. female with a known history of hypertension, diabetes presents to the emergency room complaining of 2 days of cough with yellow sputum. She has had nausea and one episode of vomiting yesterday. Diarrhea yesterday which has resolved. She does complain of some mild upper abdominal pain on coughing. She has felt dizzy and extremely weak. Lives alone. Chest x-ray shows bibasilar atelectasis versus pneumonia. No recent antibiotic use. No sick contacts. Afebrile and has normal white count.  Patient is being admitted under observation.  PAST MEDICAL HISTORY:   Past Medical History  Diagnosis Date  . Arthritis   . Ulcer   . Pneumonia      x 23yrs ago  . Hyperlipidemia   . Hypertension   . Diabetes mellitus     border line  . Cataract   . Osteoporosis   . Atrophic vaginitis 11/16/2014    PAST SURGICAL HISTORY:   Past Surgical History  Procedure Laterality Date  . Throat surgery      x 10 yrs ago. throat mass.  . Abdominal hysterectomy  1972  . Eye surgery      SOCIAL HISTORY:   Social History  Substance Use Topics  . Smoking status: Never Smoker   . Smokeless tobacco: Never Used  . Alcohol Use: No    FAMILY HISTORY:   Family History  Problem Relation Age of Onset  . Anuerysm Mother   . Cancer Father     prostrate cancer  . Diabetes Brother   . Hearing loss Brother     DRUG ALLERGIES:   Allergies  Allergen Reactions  . Amoxicillin-Pot Clavulanate Other (See Comments)  . Codeine Hives  . Hydrocodone Nausea Only  . Metronidazole Nausea Only  .  Propoxyphene Nausea Only  . Latex Rash    REVIEW OF SYSTEMS:   Review of Systems  Constitutional: Positive for malaise/fatigue. Negative for fever, chills and weight loss.  HENT: Negative for hearing loss and nosebleeds.   Eyes: Negative for blurred vision, double vision and pain.  Respiratory: Positive for cough and sputum production. Negative for hemoptysis, shortness of breath and wheezing.   Cardiovascular: Negative for chest pain, palpitations, orthopnea and leg swelling.  Gastrointestinal: Positive for nausea and vomiting. Negative for abdominal pain, diarrhea and constipation.  Genitourinary: Negative for dysuria and hematuria.  Musculoskeletal: Negative for myalgias, back pain and falls.  Skin: Negative for rash.  Neurological: Positive for dizziness and weakness. Negative for tremors, sensory change, speech change, focal weakness, seizures and headaches.  Endo/Heme/Allergies: Does not bruise/bleed easily.  Psychiatric/Behavioral: Negative for depression and memory loss. The patient is not nervous/anxious.     MEDICATIONS AT HOME:   Prior to Admission medications   Medication Sig Start Date End Date Taking? Authorizing Provider  ALPRAZolam (XANAX) 0.25 MG tablet Take 0.25 mg by mouth at bedtime as needed.     Yes Historical Provider, MD  aspirin 81 MG tablet Take 81 mg by mouth daily.     Yes Historical Provider, MD  Cranberry 400 MG CAPS Take 1 tablet by mouth 2 (two)  times daily.   Yes Historical Provider, MD  cyanocobalamin (,VITAMIN B-12,) 1000 MCG/ML injection Inject 1 mL into the muscle every 30 (thirty) days. 01/10/15 01/05/16 Yes Historical Provider, MD  fexofenadine (ALLEGRA) 180 MG tablet Take 1 tablet by mouth as needed.   Yes Historical Provider, MD  fluticasone (FLONASE) 50 MCG/ACT nasal spray Place 2 sprays into the nose daily. 02/19/15 02/19/16 Yes Historical Provider, MD  gabapentin (NEURONTIN) 100 MG capsule Take 100 mg by mouth 3 (three) times daily.     Yes  Historical Provider, MD  isosorbide mononitrate (IMDUR) 60 MG 24 hr tablet Take 60 mg by mouth daily.     Yes Historical Provider, MD  metoprolol (LOPRESSOR) 100 MG tablet Take 100 mg by mouth 2 (two) times daily.    Yes Historical Provider, MD  pantoprazole (PROTONIX) 40 MG tablet Take 1 tablet by mouth daily. 04/28/15  Yes Historical Provider, MD  predniSONE (DELTASONE) 2.5 MG tablet Take 2.5 mg by mouth daily.    Yes Historical Provider, MD  valsartan-hydrochlorothiazide (DIOVAN-HCT) 160-12.5 MG per tablet Take 1 tablet by mouth daily.    Yes Historical Provider, MD     VITAL SIGNS:  Blood pressure 121/49, pulse 72, temperature 99.4 F (37.4 C), temperature source Oral, resp. rate 18, SpO2 100 %.  PHYSICAL EXAMINATION:  Physical Exam  GENERAL:  82 y.o.-year-old patient lying in the bed with no acute distress.  EYES: Pupils equal, round, reactive to light and accommodation. No scleral icterus. Extraocular muscles intact.  HEENT: Head atraumatic, normocephalic. Oropharynx and nasopharynx clear. No oropharyngeal erythema, moist oral mucosa  NECK:  Supple, no jugular venous distention. No thyroid enlargement, no tenderness.  LUNGS: Normal breath sounds bilaterally, no wheezing, rales, rhonchi. No use of accessory muscles of respiration.  CARDIOVASCULAR: S1, S2 normal. No murmurs, rubs, or gallops.  ABDOMEN: Soft, nontender, nondistended. Bowel sounds present. No organomegaly or mass.  EXTREMITIES: No pedal edema, cyanosis, or clubbing. + 2 pedal & radial pulses b/l.   NEUROLOGIC: Cranial nerves II through XII are intact. No focal Motor or sensory deficits appreciated b/l PSYCHIATRIC: The patient is alert and oriented x 3. Good affect.  SKIN: No obvious rash, lesion, or ulcer.   LABORATORY PANEL:   CBC  Recent Labs Lab 10/15/15 1502  WBC 6.9  HGB 12.7  HCT 37.3  PLT 164    ------------------------------------------------------------------------------------------------------------------  Chemistries   Recent Labs Lab 10/15/15 1502  NA 132*  K 3.2*  CL 95*  CO2 28  GLUCOSE 145*  BUN 20  CREATININE 1.25*  CALCIUM 9.2  AST 35  ALT 17  ALKPHOS 59  BILITOT 0.8   ------------------------------------------------------------------------------------------------------------------  Cardiac Enzymes  Recent Labs Lab 10/15/15 1502  TROPONINI <0.03   ------------------------------------------------------------------------------------------------------------------  RADIOLOGY:  Dg Chest 2 View  10/15/2015  CLINICAL DATA:  Hypotension.  Nausea, diarrhea, and cough. EXAM: CHEST - 2 VIEW COMPARISON:  Two-view chest x-ray 05/06/2015. FINDINGS: The heart is mildly enlarged. New bilateral pleural effusions are present. Bibasilar airspace disease is evident. The upper lung fields are clear. The visualized soft tissues and bony thorax are unremarkable. IMPRESSION: 1. New bilateral pleural effusions. 2. Increasing bibasilar airspace disease, left greater than right. While this may represent atelectasis, it is concerning for infection. Electronically Signed   By: San Morelle M.D.   On: 10/15/2015 15:27     IMPRESSION AND PLAN:   * Bibasilar infiltrates with influenza A positive Influenza pneumonitis Start Tamiflu. Nebulizer when necessary. Cough medication. Afebrile and normal white count.  Patient will be admitted under observation. Oxygen as needed  * Hypertension Low normal blood pressure which is improving with IV fluids. Ordered Imdur from tomorrow. He'll valsartan and hydrochlorothiazide.  * Diabetes mellitus Sliding scale insulin and diabetic diet  * DVT prophylaxis with Lovenox  All the records are reviewed and case discussed with ED provider. Management plans discussed with the patient, family and they are in agreement.  CODE STATUS:  FULL  TOTAL TIME TAKING CARE OF THIS PATIENT: 40 minutes.   Hillary Bow R M.D on 10/15/2015 at 5:37 PM  Between 7am to 6pm - Pager - [phone]  After 6pm go to [email or web address] - password [email or web address] [facility]  Office  [phone]  CC: Primary care physician; Glendon Axe, MD  Note: This dictation was prepared with Dragon dictation along with smaller phrase technology. Any transcriptional errors that result from this process are unintentional.

## 2015-10-15 NOTE — ED Provider Notes (Signed)
[facility] Emergency Department Provider Note  ____________________________________________  Time seen: Approximately 250 PM  I have reviewed the triage vital signs and the nursing notes.   HISTORY  Chief Complaint Weakness and Diarrhea    HPI Dawn Mcintyre is a 82 y.o. female who is presenting to the emergency department today with hypotension and near syncope. She says that over the past 2 days she has had body aches, nausea, diarrhea and cough. She says that the diarrhea has stopped at this point. However, she feels significantly weak today, diffusely. She was taken to the [place] clinic by her neighbor where she was found to have a systolic blood pressure in the 80s. She denies any blood in her stool.    Past Medical History  Diagnosis Date  . Arthritis   . Ulcer   . Pneumonia      x 59yrs ago  . Hyperlipidemia   . Hypertension   . Diabetes mellitus     border line  . Cataract   . Osteoporosis   . Atrophic vaginitis 11/16/2014    Patient Active Problem List   Diagnosis Date Noted  . Atrophic vaginitis 11/16/2014    Past Surgical History  Procedure Laterality Date  . Throat surgery      x 10 yrs ago. throat mass.  . Abdominal hysterectomy  1972  . Eye surgery      Current Outpatient Rx  Name  Route  Sig  Dispense  Refill  . ALPRAZolam (XANAX) 0.25 MG tablet   Oral   Take 0.25 mg by mouth at bedtime as needed.           Marland Kitchen aspirin 81 MG tablet   Oral   Take 81 mg by mouth daily.           Marland Kitchen conjugated estrogens (PREMARIN) vaginal cream      Use 1 gram per vagina every other night.   42.5 g   12   . Cranberry 400 MG CAPS   Oral   Take 1 tablet by mouth 2 (two) times daily.         . cyanocobalamin (,VITAMIN B-12,) 1000 MCG/ML injection   Intramuscular   Inject 1 mL into the muscle every 30 (thirty) days.         . fexofenadine (ALLEGRA) 180 MG tablet   Oral   Take 1 tablet by mouth as needed.         .  fluticasone (FLONASE) 50 MCG/ACT nasal spray   Nasal   Place 2 sprays into the nose daily.         Marland Kitchen gabapentin (NEURONTIN) 100 MG capsule   Oral   Take 100 mg by mouth 3 (three) times daily.           . isosorbide mononitrate (IMDUR) 60 MG 24 hr tablet   Oral   Take 60 mg by mouth daily.           . metoprolol (LOPRESSOR) 100 MG tablet   Oral   Take 100 mg by mouth once.           . metoprolol (TOPROL-XL) 200 MG 24 hr tablet   Oral   Take 200 mg by mouth daily.         . ondansetron (ZOFRAN) 4 MG tablet      Take 1-2 tabs by mouth every 8 hours as needed for nausea/vomiting   30 tablet   0   . pantoprazole (PROTONIX)  40 MG tablet   Oral   Take 1 tablet by mouth daily.         . predniSONE (DELTASONE) 2.5 MG tablet   Oral   Take 2.5 mg by mouth daily.          . valsartan-hydrochlorothiazide (DIOVAN-HCT) 160-12.5 MG per tablet   Oral   Take 1 tablet by mouth daily.            Allergies Amoxicillin-pot clavulanate; Codeine; Hydrocodone; Metronidazole; Propoxyphene; and Latex  Family History  Problem Relation Age of Onset  . Anuerysm Mother   . Cancer Father     prostrate cancer  . Diabetes Brother   . Hearing loss Brother     Social History Social History  Substance Use Topics  . Smoking status: Never Smoker   . Smokeless tobacco: Never Used  . Alcohol Use: No    Review of Systems Constitutional: No fever/chills Eyes: No visual changes. ENT: No sore throat. Cardiovascular: Denies chest pain. Respiratory: Denies shortness of breath. Gastrointestinal:   No constipation. Genitourinary: Negative for dysuria. Musculoskeletal: Negative for back pain. Skin: Negative for rash. Neurological: Negative for headaches, focal weakness or numbness.  10-point ROS otherwise negative.  ____________________________________________   PHYSICAL EXAM:  VITAL SIGNS: ED Triage Vitals  Enc Vitals Group     BP --      Pulse --      Resp --       Temp --      Temp src --      SpO2 --      Weight --      Height --      Head Cir --      Peak Flow --      Pain Score --      Pain Loc --      Pain Edu? --      Excl. in [place]? --     Constitutional: Alert and oriented.  in no acute distress.  Pale skin. Eyes: Conjunctivae are normal. PERRL. EOMI. Head: Atraumatic. Nose: No congestion/rhinnorhea. Mouth/Throat: Mucous membranes are Dry    Neck: No stridor.   Cardiovascular: Normal rate, regular rhythm. Grossly normal heart sounds.  Good peripheral circulation. Respiratory: Normal respiratory effort.  No retractions. Lungs CTAB. Gastrointestinal: Soft and nontender. No distention.  No CVA tenderness. Musculoskeletal: No lower extremity tenderness nor edema.  No joint effusions. Neurologic:  Normal speech and language. No gross focal neurologic deficits are appreciated.  Skin:  Skin is warm, dry and intact. No rash noted. Psychiatric: Mood and affect are normal. Speech and behavior are normal.  ____________________________________________   LABS (all labs ordered are listed, but only abnormal results are displayed)  Labs Reviewed  RAPID INFLUENZA A&B ANTIGENS ([facility] ONLY) - Abnormal; Notable for the following:    Influenza A ([facility]) POSITIVE (*)    All other components within normal limits  CBC WITH DIFFERENTIAL/PLATELET - Abnormal; Notable for the following:    Lymphs Abs 0.9 (*)    Monocytes Absolute 1.0 (*)    All other components within normal limits  COMPREHENSIVE METABOLIC PANEL - Abnormal; Notable for the following:    Sodium 132 (*)    Potassium 3.2 (*)    Chloride 95 (*)    Glucose, Bld 145 (*)    Creatinine, Ser 1.25 (*)    GFR calc non Af Amer 40 (*)    GFR calc Af Amer 46 (*)    All other components within normal limits  URINALYSIS COMPLETEWITH MICROSCOPIC ([facility] ONLY) - Abnormal; Notable for the following:    Color, Urine YELLOW (*)    APPearance HAZY (*)    Hgb urine dipstick 1+ (*)    Protein, ur 30 (*)     Leukocytes, UA 3+ (*)    Bacteria, UA RARE (*)    Squamous Epithelial / LPF 0-5 (*)    All other components within normal limits  URINE CULTURE  LIPASE, BLOOD  TROPONIN I  LACTIC ACID, PLASMA  LACTIC ACID, PLASMA   ____________________________________________  EKG  ED ECG REPORT I, Doran Stabler, the attending physician, personally viewed and interpreted this ECG.   Date: 10/15/2015  EKG Time: 1509  Rate: 66  Rhythm: normal sinus rhythm  Axis: Normal  Intervals:none  ST&T Change: No ST segment elevation or depression. No abnormal T-wave inversion.  ____________________________________________  RADIOLOGY   IMPRESSION: 1. New bilateral pleural effusions. 2. Increasing bibasilar airspace disease, left greater than right. While this may represent atelectasis, it is concerning for infection.   Electronically Signed By: San Morelle M.D. On: 10/15/2015 15:27 ____________________________________________   PROCEDURES   ____________________________________________   INITIAL IMPRESSION / ASSESSMENT AND PLAN / ED COURSE  Pertinent labs & imaging results that were available during my care of the patient were reviewed by me and considered in my medical decision making (see chart for details).  ----------------------------------------- 5:16 PM on 10/15/2015 -----------------------------------------  Patient's condition has improved. She is feeling better at this time with a normalized blood pressure. She was found to have bibasilar pneumonia and to be flu positive. She also appears to have urinary tract infection. I gave her the option to be discharged home or stay in the hospital because of her advanced age with multiple foci of infection.  She says that she would rather stay overnight in the hospital because she lives at home alone and is concerned that she may worsen. She does also continue to have a hacking cough. Says that she did not have any  airway compromise when she took amoxicillin the past. Within 48 hour window for Tamiflu. Signed out to Dr. Darvin Neighbours. ____________________________________________   FINAL CLINICAL IMPRESSION(S) / ED DIAGNOSES  Influenza a. Bibasilar pneumonia. Urinary tract infection.    Orbie Pyo, MD 10/15/15 [phone]

## 2015-10-15 NOTE — ED Notes (Signed)
Patient transported to X-ray 

## 2015-10-16 LAB — GLUCOSE, CAPILLARY
GLUCOSE-CAPILLARY: 95 mg/dL (ref 65–99)
GLUCOSE-CAPILLARY: 86 mg/dL (ref 65–99)
Glucose-Capillary: 94 mg/dL (ref 65–99)
Glucose-Capillary: 89 mg/dL (ref 65–99)

## 2015-10-16 LAB — C DIFFICILE QUICK SCREEN W PCR REFLEX
C DIFFICILE (CDIFF) TOXIN: NEGATIVE
C DIFFICLE (CDIFF) ANTIGEN: NEGATIVE
C Diff interpretation: NEGATIVE

## 2015-10-16 MED ORDER — RISAQUAD PO CAPS
2.0000 | ORAL_CAPSULE | Freq: Three times a day (TID) | ORAL | Status: DC
Start: 2015-10-16 — End: 2015-10-17
  Administered 2015-10-16 – 2015-10-17 (×3): 2 via ORAL
  Filled 2015-10-16 (×3): qty 2

## 2015-10-16 MED ORDER — LEVOFLOXACIN IN D5W 750 MG/150ML IV SOLN
750.0000 mg | INTRAVENOUS | Status: DC
  Filled 2015-10-16 (×2): qty 150

## 2015-10-16 MED ORDER — LEVOFLOXACIN IN D5W 750 MG/150ML IV SOLN
750.0000 mg | INTRAVENOUS | Status: DC
  Administered 2015-10-16: 750 mg via INTRAVENOUS
  Filled 2015-10-16: qty 150

## 2015-10-16 MED ORDER — ASPIRIN 81 MG PO CHEW
81.0000 mg | CHEWABLE_TABLET | Freq: Every day | ORAL | Status: DC
Start: 2015-10-16 — End: 2015-10-17
  Administered 2015-10-16 – 2015-10-17 (×2): 81 mg via ORAL
  Filled 2015-10-16 (×2): qty 1

## 2015-10-16 NOTE — Progress Notes (Signed)
Pharmacy Antibiotic Note  Dawn Mcintyre is a 82 y.o. female admitted on 10/15/2015 with pneumonia and influenza.  Pharmacy has been consulted for Levaquin dosing.  Plan:  Will order Levaquin 750mg  IV q48h.  Height: 5\' 7"  (170.2 cm) Weight: 151 lb 6.4 oz (68.675 kg) IBW/kg (Calculated) : 61.6  Temp (24hrs), Avg:97.5 F (36.4 C), Min:93 F (33.9 C), Max:99.4 F (37.4 C)   Recent Labs Lab 10/15/15 1502 10/15/15 1503 10/15/15 1902  WBC 6.9  --   --   CREATININE 1.25*  --   --   LATICACIDVEN  --  1.6 1.3    Estimated Creatinine Clearance: 34.9 mL/min (by C-G formula based on Cr of 1.25).    Allergies  Allergen Reactions  . Amoxicillin-Pot Clavulanate Other (See Comments)  . Codeine Hives  . Hydrocodone Nausea Only  . Metronidazole Nausea Only  . Propoxyphene Nausea Only  . Latex Rash    Antimicrobials this admission: Levaquin 3/13 >>   Dose adjustments this admission:  Microbiology results:  Thank you for allowing pharmacy to be a part of this patient's care.  Paulina Fusi, PharmD, BCPS 10/16/2015 10:22 AM

## 2015-10-16 NOTE — Progress Notes (Signed)
Pt had three loose stools today. RN placed pt on contact/enteric precautions to rule out C. Diff. Stool was sent to lab. Test came back negative. Will continue to monitor pt.  Angus Seller

## 2015-10-16 NOTE — Progress Notes (Signed)
[place] [facility] [place] NAME: Dawn Mcintyre    MR#:  [id number]  DATE OF BIRTH:  01/20/1933  SUBJECTIVE:  CHIEF COMPLAINT:  Patient is feeling slightly better. Reporting coughing and body aches associated with weakness  REVIEW OF SYSTEMS:  CONSTITUTIONAL: No fever,  reporting weakness.  EYES: No blurred or double vision.  EARS, NOSE, AND THROAT: No tinnitus or ear pain.  RESPIRATORY:  Has productive cough,  Denies shortness of breath, wheezing or hemoptysis.  CARDIOVASCULAR: No chest pain, orthopnea, edema.  GASTROINTESTINAL: No nausea, vomiting, diarrhea or abdominal pain.  GENITOURINARY: No dysuria, hematuria.  ENDOCRINE: No polyuria, nocturia,  HEMATOLOGY: No anemia, easy bruising or bleeding SKIN: No rash or lesion. MUSCULOSKELETAL: No joint pain or arthritis.   NEUROLOGIC: No tingling, numbness, weakness.  PSYCHIATRY: No anxiety or depression.   DRUG ALLERGIES:   Allergies  Allergen Reactions  . Amoxicillin-Pot Clavulanate Other (See Comments)  . Codeine Hives  . Hydrocodone Nausea Only  . Metronidazole Nausea Only  . Propoxyphene Nausea Only  . Latex Rash    VITALS:  Blood pressure 103/50, pulse 60, temperature 98.1 F (36.7 C), temperature source Oral, resp. rate 18, height 5\' 7"  (1.702 m), weight 68.675 kg (151 lb 6.4 oz), SpO2 93 %.  PHYSICAL EXAMINATION:  GENERAL:  82 y.o.-year-old patient lying in the bed with no acute distress.  EYES: Pupils equal, round, reactive to light and accommodation. No scleral icterus. Extraocular muscles intact.  HEENT: Head atraumatic, normocephalic. Oropharynx and nasopharynx clear.  NECK:  Supple, no jugular venous distention. No thyroid enlargement, no tenderness.  LUNGS: Normal breath sounds bilaterally, but diminished at the bases, no wheezing, rales,rhonchi or crepitation. No use of accessory muscles of respiration.  CARDIOVASCULAR: S1, S2 normal. No murmurs, rubs, or gallops.   ABDOMEN: Soft, nontender, nondistended. Bowel sounds present. No organomegaly or mass.  EXTREMITIES: No pedal edema, cyanosis, or clubbing.  NEUROLOGIC: Cranial nerves II through XII are intact. Muscle strength 5/5 in all extremities. Sensation intact. Gait not checked.  PSYCHIATRIC: The patient is alert and oriented x 3.  SKIN: No obvious rash, lesion, or ulcer.    LABORATORY PANEL:   CBC  Recent Labs Lab 10/15/15 1502  WBC 6.9  HGB 12.7  HCT 37.3  PLT 164   ------------------------------------------------------------------------------------------------------------------  Chemistries   Recent Labs Lab 10/15/15 1502  NA 132*  K 3.2*  CL 95*  CO2 28  GLUCOSE 145*  BUN 20  CREATININE 1.25*  CALCIUM 9.2  AST 35  ALT 17  ALKPHOS 59  BILITOT 0.8   ------------------------------------------------------------------------------------------------------------------  Cardiac Enzymes  Recent Labs Lab 10/15/15 1502  TROPONINI <0.03   ------------------------------------------------------------------------------------------------------------------  RADIOLOGY:  Dg Chest 2 View  10/15/2015  CLINICAL DATA:  Hypotension.  Nausea, diarrhea, and cough. EXAM: CHEST - 2 VIEW COMPARISON:  Two-view chest x-ray 05/06/2015. FINDINGS: The heart is mildly enlarged. New bilateral pleural effusions are present. Bibasilar airspace disease is evident. The upper lung fields are clear. The visualized soft tissues and bony thorax are unremarkable. IMPRESSION: 1. New bilateral pleural effusions. 2. Increasing bibasilar airspace disease, left greater than right. While this may represent atelectasis, it is concerning for infection. Electronically Signed   By: San Morelle M.D.   On: 10/15/2015 15:27    EKG:   Orders placed or performed during the hospital encounter of 10/15/15  . ED EKG  . ED EKG  . EKG 12-Lead  . EKG 12-Lead    ASSESSMENT AND PLAN:   *  Bibasilar infiltrates  with influenza A positive Influenza pneumonitis on Tamiflu. Nebulizer when necessary. Cough medication. Started patient on levofloxacin for possible superimposed bacterial pneumonia Await clinical improvement Oxygen as needed  * Hypertension Low normal blood pressure which is improving with IV fluids.  Resume  Imdur . He'll valsartan and hydrochlorothiazide.  * Diabetes mellitus Sliding scale insulin and diabetic diet  * DVT prophylaxis with Lovenox     All the records are reviewed and case discussed with Care Management/Social Workerr. Management plans discussed with the patient, family and they are in agreement.  CODE STATUS: fc  TOTAL TIME TAKING CARE OF THIS PATIENT: 35 minutes.   POSSIBLE D/C IN 1-2  DAYS, DEPENDING ON CLINICAL CONDITION.   Nicholes Mango M.D on 10/16/2015 at 1:13 PM  Between 7am to 6pm - Pager - [phone] After 6pm go to [email or web address] - password [email or web address] [facility]  Office  [phone]  CC: Primary care physician; Glendon Axe, MD

## 2015-10-17 LAB — BASIC METABOLIC PANEL
ANION GAP: 6 (ref 5–15)
BUN: 19 mg/dL (ref 6–20)
CHLORIDE: 103 mmol/L (ref 101–111)
CO2: 26 mmol/L (ref 22–32)
CREATININE: 1.01 mg/dL — AB (ref 0.44–1.00)
Calcium: 9 mg/dL (ref 8.9–10.3)
GFR calc non Af Amer: 51 mL/min — ABNORMAL LOW (ref >60)
GFR, EST AFRICAN AMERICAN: 59 mL/min — AB (ref >60)
Glucose, Bld: 94 mg/dL (ref 65–99)
Potassium: 2.8 mmol/L — CL (ref 3.5–5.1)
SODIUM: 135 mmol/L (ref 135–145)

## 2015-10-17 LAB — GLUCOSE, CAPILLARY
GLUCOSE-CAPILLARY: 119 mg/dL — AB (ref 65–99)
GLUCOSE-CAPILLARY: 113 mg/dL — AB (ref 65–99)
Glucose-Capillary: 144 mg/dL — ABNORMAL HIGH (ref 65–99)

## 2015-10-17 LAB — MAGNESIUM
MAGNESIUM: 1.5 mg/dL — AB (ref 1.7–2.4)
MAGNESIUM: 1.8 mg/dL (ref 1.7–2.4)

## 2015-10-17 LAB — URINE CULTURE

## 2015-10-17 LAB — POTASSIUM: Potassium: 4.4 mmol/L (ref 3.5–5.1)

## 2015-10-17 MED ORDER — ACETAMINOPHEN 325 MG PO TABS: 650.0000 mg | ORAL_TABLET | Freq: Four times a day (QID) | ORAL | Status: DC | PRN

## 2015-10-17 MED ORDER — METOPROLOL TARTRATE 25 MG PO TABS: 25.0000 mg | ORAL_TABLET | Freq: Two times a day (BID) | ORAL | Status: DC

## 2015-10-17 MED ORDER — LEVOFLOXACIN 750 MG PO TABS: 750.0000 mg | ORAL_TABLET | ORAL | Status: DC

## 2015-10-17 MED ORDER — MAGNESIUM SULFATE IN D5W 10-5 MG/ML-% IV SOLN
1.0000 g | Freq: Once | INTRAVENOUS | Status: AC
  Administered 2015-10-17: 1 g via INTRAVENOUS
  Filled 2015-10-17: qty 100

## 2015-10-17 MED ORDER — POTASSIUM CHLORIDE 10 MEQ/100ML IV SOLN
10.0000 meq | INTRAVENOUS | Status: DC
  Administered 2015-10-17: 10 meq via INTRAVENOUS
  Filled 2015-10-17 (×3): qty 100

## 2015-10-17 MED ORDER — OSELTAMIVIR PHOSPHATE 30 MG PO CAPS: 30.0000 mg | ORAL_CAPSULE | Freq: Two times a day (BID) | ORAL | Status: AC

## 2015-10-17 MED ORDER — POTASSIUM CHLORIDE 20 MEQ PO PACK: 40.0000 meq | PACK | Freq: Once | ORAL | Status: DC

## 2015-10-17 MED ORDER — GUAIFENESIN-DM 100-10 MG/5ML PO SYRP: 5.0000 mL | ORAL_SOLUTION | Freq: Four times a day (QID) | ORAL | Status: DC | PRN

## 2015-10-17 MED ORDER — RISAQUAD PO CAPS: 2.0000 | ORAL_CAPSULE | Freq: Three times a day (TID) | ORAL | Status: DC

## 2015-10-17 MED ORDER — POTASSIUM CHLORIDE CRYS ER 20 MEQ PO TBCR
40.0000 meq | EXTENDED_RELEASE_TABLET | ORAL | Status: AC
  Administered 2015-10-17 (×2): 40 meq via ORAL
  Filled 2015-10-17 (×2): qty 2

## 2015-10-17 MED ORDER — POTASSIUM CHLORIDE 20 MEQ PO PACK
40.0000 meq | PACK | Freq: Once | ORAL | Status: AC
Start: 2015-10-17 — End: 2015-10-17
  Administered 2015-10-17: 40 meq via ORAL
  Filled 2015-10-17: qty 2

## 2015-10-17 MED ORDER — POTASSIUM CHLORIDE 20 MEQ PO PACK
40.0000 meq | PACK | ORAL | Status: AC
  Administered 2015-10-17: 40 meq via ORAL
  Filled 2015-10-17: qty 2

## 2015-10-17 NOTE — Care Management Note (Signed)
Case Management Note  Patient Details  Name: Dawn Mcintyre MRN: [id number] Date of Birth: Jul 17, 1933  Subjective/Objective:                 Patient admitted from home with flu.  Patient lives by herself.  States that she is independent of ADL's, and still drives.  Obtains her medications from [facility] on [address]  Denies issues with obtaining medications. PT has recommended Home health PT.  Provided patient with agency preference list.  [facility] selected.  Jason with [facility] Notified.  RW ordered.  Will from [facility] to deliver to room prior to discharge.    Action/Plan: RNCM signing off  Expected Discharge Date:                  Expected Discharge Plan:     In-House Referral:     Discharge planning Services     Post Acute Care Choice:    Choice offered to:     DME Arranged:    DME Agency:     HH Arranged:    [place] Agency:     Status of Service:     Medicare Important Message Given:    Date Medicare IM Given:    Medicare IM give by:    Date Additional Medicare IM Given:    Additional Medicare Important Message give by:     If discussed at [place] of Stay Meetings, dates discussed:    Additional Comments:  Beverly Sessions, RN 10/17/2015, 2:44 PM

## 2015-10-17 NOTE — Progress Notes (Signed)
Patient discharged to home. Son at the bedside and given discharge instructions along with patient. Patient instructed to make a follow up appointment with Dr. Glendon Axe in one week. IV discontinued to left antecubital site dry clean and intact. Prescriptions given to patient and son stated that he  will make sure prescriptions are filled. Patient alert and oriented ambulates with walker. Walker delivered to the hospital and patient took walker home.

## 2015-10-17 NOTE — Discharge Instructions (Signed)
Activity as tolerated per PT recommendations Diet low-salt and diabetic Follow-up with primary care physician in a week

## 2015-10-17 NOTE — Progress Notes (Signed)
Spoke with Dr.Diamond about critical potassium of 2.8. Dr.Diamond stated he would put in some orders. Dawn Mcintyre

## 2015-10-17 NOTE — Evaluation (Signed)
Physical Therapy Evaluation Patient Details Name: Dawn Mcintyre MRN: [id number] DOB: January 28, 1933 Today's Date: 10/17/2015   History of Present Illness  82 yo F presented to ED from home due to hypotension and near syncope, found to have influenza, UTI and CAP. PMH includes HTN, DM, and osteoporosis.  Clinical Impression  Pt demonstrated generalized weakness and difficulty walking after decreased activity due to illness. She is oriented and alert. Pt I with bed mobility and mod I with transfers and supervision for ambulation of 220 ft with FWW. Attempted ambulation with and without AD, with pt being much safer and functional with FWW at this time. HHPT has been recommended to increase strength, balance and mobility as well as assess home safety in order for pt to return to her PLOF. FWW was recommended as well for reduced risk of falls due to unsteadiness without AD. Pt will benefit from skilled PT services to increase functional I and mobility for safe discharge.     Follow Up Recommendations Home health PT;Supervision - Intermittent(lives alone)    Equipment Recommendations  Rolling walker with 5" wheels    Recommendations for Other Services       Precautions / Restrictions Precautions Precautions: Fall Restrictions Weight Bearing Restrictions: No      Mobility  Bed Mobility Overal bed mobility: Independent                Transfers Overall transfer level: Modified independent Equipment used: Rolling walker (2 wheeled)             General transfer comment: Pt is slightly unsteady upon standing and needs FWW for increased support and reduced risk of falls. Cues provided for safe technique and use of FWW.  Ambulation/Gait Ambulation/Gait assistance: Supervision(with FWW), min guard without AD Ambulation Distance (Feet): 220 Feet Assistive device: Rolling walker (2 wheeled);None Gait Pattern/deviations: Decreased stride length;Drifts right/left;Narrow base of  support Gait velocity: reduced Gait velocity interpretation: Below normal speed for age/gender General Gait Details: Ambulated without FWW with unsteadiness and occassional LOB with self correction. 10 ft gait speed 11 sec. When ambulating with FWW, pt much more steady and safe with no LOB. 10 ft gait speed 8 sec.  Stairs            Wheelchair Mobility    Modified Rankin (Stroke Patients Only)       Balance Overall balance assessment: Needs assistance Sitting-balance support: No upper extremity supported Sitting balance-Leahy Scale: Good Sitting balance - Comments: maintains independently   Standing balance support: No upper extremity supported Standing balance-Leahy Scale: Fair Standing balance comment: pt slightly unsteady with posterior lean when initially standing; pt is much more stable and safe with use of FWW                             Pertinent Vitals/Pain Pain Assessment: No/denies pain    Home Living Family/patient expects to be discharged to:: Private residence Living Arrangements: Alone Available Help at Discharge: Family Type of Home: House Home Access: Level entry     Home Layout: One level Home Equipment: None      Prior Function Level of Independence: Independent         Comments: Pt I with ADLs, 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 She reports she is always on the go and active.     Hand Dominance        Extremity/Trunk Assessment   Upper Extremity Assessment:  Overall WFL for tasks assessed           Lower Extremity Assessment: Generalized weakness         Communication   Communication: No difficulties  Cognition Arousal/Alertness: Awake/alert Behavior During Therapy: WFL for tasks assessed/performed Overall Cognitive Status: Within Functional Limits for tasks assessed                      General Comments      Exercises Other Exercises Other Exercises: Pt  ambulated 23ft without AD and 240ft with FWW. Safety, steadiness and gait speed increased with use of FWW in comparison to without AD. Standing therapeutic rest break for energy conservation.      Assessment/Plan    PT Assessment Patient needs continued PT services  PT Diagnosis Difficulty walking;Generalized weakness   PT Problem List Decreased strength;Decreased activity tolerance;Decreased balance;Decreased mobility;Decreased knowledge of use of DME  PT Treatment Interventions DME instruction;Gait training;Stair training;Therapeutic activities;Therapeutic exercise;Balance training;Neuromuscular re-education;Patient/family education   PT Goals (Current goals can be found in the Care Plan section) Acute Rehab PT Goals Patient Stated Goal: To get back to normal. PT Goal Formulation: With patient Time For Goal Achievement: 10/31/15 Potential to Achieve Goals: Good    Frequency Min 2X/week   Barriers to discharge Decreased caregiver support lives alone    Co-evaluation               End of Session Equipment Utilized During Treatment: Gait belt Activity Tolerance: Patient tolerated treatment well Patient left: in chair;with call bell/phone within reach;with chair alarm set Nurse Communication: Mobility status    Functional Assessment Tool Used: Clinical Judgement; gait speed Functional Limitation: Mobility: Walking and moving around Mobility: Walking and Moving Around Current Status [phone]): At least 1 percent but less than 20 percent impaired, limited or restricted Mobility: Walking and Moving Around Goal Status [phone]): 0 percent impaired, limited or restricted    Time: 1348-1416 PT Time Calculation (min) (ACUTE ONLY): 28 min   Charges:   PT Evaluation $PT Eval Low Complexity: 1 Procedure PT Treatments $Gait Training: 8-22 mins   PT G Codes:   PT G-Codes **NOT FOR INPATIENT CLASS** Functional Assessment Tool Used: Clinical Judgement; gait speed Functional  Limitation: Mobility: Walking and moving around Mobility: Walking and Moving Around Current Status [phone]): At least 1 percent but less than 20 percent impaired, limited or restricted Mobility: Walking and Moving Around Goal Status [phone]): 0 percent impaired, limited or restricted    Neoma Laming, PT, DPT  10/17/2015, 3:23 PM [phone]

## 2015-10-17 NOTE — Discharge Summary (Signed)
[facility] NAME: Dawn Mcintyre    MR#:  [id number]  DATE OF BIRTH:  1932-12-30  DATE OF ADMISSION:  10/15/2015 ADMITTING PHYSICIAN: Hillary Bow, MD  DATE OF DISCHARGE: 10/17/15 PRIMARY CARE PHYSICIAN: Singh,Jasmine, MD    ADMISSION DIAGNOSIS:  UTI (lower urinary tract infection) [N39.0] Influenza A [J10.1] CAP (community acquired pneumonia) [J18.9]  DISCHARGE DIAGNOSIS:  Active Problems:   Influenza Community acquired pneumonia SECONDARY DIAGNOSIS:   Past Medical History  Diagnosis Date  . Arthritis   . Ulcer   . Pneumonia      x 80yrs ago  . Hyperlipidemia   . Hypertension   . Diabetes mellitus     border line  . Cataract   . Osteoporosis   . Atrophic vaginitis 11/16/2014    HOSPITAL COURSE:   * Bibasilar infiltrates with influenza A positive Influenza pneumonitis on Tamiflu. Nebulizer when necessary. Cough medication. on levofloxacin for possible superimposed bacterial pneumonia  clinically  Improved  *Hypokalemia and hypomagnesemia Repleted   * Hypertension Low normal blood pressure which has improved with IV fluids.  Resume Imdur . Resume metoprolol at low dose 25 mg by mouth twice a day  Hold valsartan and hydrochlorothiazide.  * Diabetes mellitus Sliding scale insulin and diabetic diet  * DVT prophylaxis with Lovenox  DISCHARGE CONDITIONS:   fair  CONSULTS OBTAINED:  Treatment Team:  Nicholes Mango, MD   PROCEDURES none  DRUG ALLERGIES:   Allergies  Allergen Reactions  . Amoxicillin-Pot Clavulanate Other (See Comments)  . Codeine Hives  . Hydrocodone Nausea Only  . Metronidazole Nausea Only  . Propoxyphene Nausea Only  . Latex Rash    DISCHARGE MEDICATIONS:   Discharge Medication List as of 10/17/2015  5:19 PM    START taking these medications   Details  acetaminophen (TYLENOL) 325 MG tablet Take 2 tablets (650 mg total) by mouth every 6 (six) hours as needed for  mild pain (or Fever >/= 101)., Starting 10/17/2015, Until Discontinued, OTC    acidophilus (RISAQUAD) CAPS capsule Take 2 capsules by mouth 3 (three) times daily., Starting 10/17/2015, Until Discontinued, Print    guaiFENesin-dextromethorphan (ROBITUSSIN DM) 100-10 MG/5ML syrup Take 5 mLs by mouth every 6 (six) hours as needed for cough., Starting 10/17/2015, Until Discontinued, OTC    levofloxacin (LEVAQUIN) 750 MG tablet Take 1 tablet (750 mg total) by mouth every other day., Starting 10/17/2015, Until Discontinued, Print    oseltamivir (TAMIFLU) 30 MG capsule Take 1 capsule (30 mg total) by mouth 2 (two) times daily., Starting 10/17/2015, Until Fri 10/20/15, Print      CONTINUE these medications which have CHANGED   Details  metoprolol tartrate (LOPRESSOR) 25 MG tablet Take 1 tablet (25 mg total) by mouth 2 (two) times daily., Starting 10/17/2015, Until Discontinued, Print      CONTINUE these medications which have NOT CHANGED   Details  ALPRAZolam (XANAX) 0.25 MG tablet Take 0.25 mg by mouth at bedtime as needed.  , Until Discontinued, Historical Med    aspirin 81 MG tablet Take 81 mg by mouth daily.  , Until Discontinued, Historical Med    Cranberry 400 MG CAPS Take 1 tablet by mouth 2 (two) times daily., Until Discontinued, Historical Med    cyanocobalamin (,VITAMIN B-12,) 1000 MCG/ML injection Inject 1 mL into the muscle every 30 (thirty) days., Starting 01/10/2015, Until Fri 01/05/16, Historical Med    fexofenadine (ALLEGRA) 180 MG tablet Take 1 tablet by mouth as needed.,  Until Discontinued, Historical Med    fluticasone (FLONASE) 50 MCG/ACT nasal spray Place 2 sprays into the nose daily., Starting 02/19/2015, Until Mon 02/19/16, Historical Med    gabapentin (NEURONTIN) 100 MG capsule Take 100 mg by mouth 3 (three) times daily.  , Until Discontinued, Historical Med    isosorbide mononitrate (IMDUR) 60 MG 24 hr tablet Take 60 mg by mouth daily.  , Until Discontinued, Historical Med     pantoprazole (PROTONIX) 40 MG tablet Take 1 tablet by mouth daily., Starting 04/28/2015, Until Discontinued, Historical Med    predniSONE (DELTASONE) 2.5 MG tablet Take 2.5 mg by mouth daily. , Until Discontinued, Historical Med    valsartan-hydrochlorothiazide (DIOVAN-HCT) 160-12.5 MG per tablet Take 1 tablet by mouth daily. , Until Discontinued, Historical Med         DISCHARGE INSTRUCTIONS:   Activity as tolerated per PT recommendations Diet low-salt and diabetic Follow-up with primary care physician in a week   DIET:  Diabetic diet, low salt  DISCHARGE CONDITION:  Fair  ACTIVITY:  Activity as tolerated  OXYGEN:  Home Oxygen: No.   Oxygen Delivery: room air  DISCHARGE LOCATION:  home   If you experience worsening of your admission symptoms, develop shortness of breath, life threatening emergency, suicidal or homicidal thoughts you must seek medical attention immediately by calling 911 or calling your MD immediately  if symptoms less severe.  You Must read complete instructions/literature along with all the possible adverse reactions/side effects for all the Medicines you take and that have been prescribed to you. Take any new Medicines after you have completely understood and accpet all the possible adverse reactions/side effects.   Please note  You were cared for by a hospitalist during your hospital stay. If you have any questions about your discharge medications or the care you received while you were in the hospital after you are discharged, you can call the unit and asked to speak with the hospitalist on call if the hospitalist that took care of you is not available. Once you are discharged, your primary care physician will handle any further medical issues. Please note that NO REFILLS for any discharge medications will be authorized once you are discharged, as it is imperative that you return to your primary care physician (or establish a relationship with a primary  care physician if you do not have one) for your aftercare needs so that they can reassess your need for medications and monitor your lab values.     Today  Chief Complaint  Patient presents with  . Weakness  . Diarrhea   Patient is feeling much better. Decreased appetite but eating fine. Son came from [place] to take care of her for the next few days  ROS:  CONSTITUTIONAL: Denies fevers, chills. Denies any fatigue, weakness.  EYES: Denies blurry vision, double vision, eye pain. EARS, NOSE, THROAT: Denies tinnitus, ear pain, hearing loss. RESPIRATORY: Denies cough, wheeze, shortness of breath.  CARDIOVASCULAR: Denies chest pain, palpitations, edema.  GASTROINTESTINAL: Denies nausea, vomiting, diarrhea, abdominal pain. Denies bright red blood per rectum. GENITOURINARY: Denies dysuria, hematuria. ENDOCRINE: Denies nocturia or thyroid problems. HEMATOLOGIC AND LYMPHATIC: Denies easy bruising or bleeding. SKIN: Denies rash or lesion. MUSCULOSKELETAL: Denies pain in neck, back, shoulder, knees, hips or arthritic symptoms.  NEUROLOGIC: Denies paralysis, paresthesias.  PSYCHIATRIC: Denies anxiety or depressive symptoms.   VITAL SIGNS:  Blood pressure 103/46, pulse 72, temperature 97.5 F (36.4 C), temperature source Oral, resp. rate 16, height 5\' 7"  (1.702 m), weight 68.765  kg (151 lb 9.6 oz), SpO2 95 %.  I/O:  No intake or output data in the 24 hours ending 10/23/15 0754  PHYSICAL EXAMINATION:  GENERAL:  82 y.o.-year-old patient lying in the bed with no acute distress.  EYES: Pupils equal, round, reactive to light and accommodation. No scleral icterus. Extraocular muscles intact.  HEENT: Head atraumatic, normocephalic. Oropharynx and nasopharynx clear.  NECK:  Supple, no jugular venous distention. No thyroid enlargement, no tenderness.  LUNGS: Normal breath sounds bilaterally, no wheezing, rales,rhonchi or crepitation. No use of accessory muscles of respiration.  CARDIOVASCULAR:  S1, S2 normal. No murmurs, rubs, or gallops.  ABDOMEN: Soft, non-tender, non-distended. Bowel sounds present. No organomegaly or mass.  EXTREMITIES: No pedal edema, cyanosis, or clubbing.  NEUROLOGIC: Cranial nerves II through XII are intact. Muscle strength 5/5 in all extremities. Sensation intact. Gait not checked.  PSYCHIATRIC: The patient is alert and oriented x 3.  SKIN: No obvious rash, lesion, or ulcer.   DATA REVIEW:   CBC No results for input(s): WBC, HGB, HCT, PLT in the last 168 hours.  Chemistries   Recent Labs Lab 10/17/15 0436 10/17/15 1545  NA 135  --   K 2.8* 4.4  CL 103  --   CO2 26  --   GLUCOSE 94  --   BUN 19  --   CREATININE 1.01*  --   CALCIUM 9.0  --   MG 1.5* 1.8    Cardiac Enzymes No results for input(s): TROPONINI in the last 168 hours.  Microbiology Results  Results for orders placed or performed during the hospital encounter of 10/15/15  Rapid Influenza A&B Antigens ([facility] only)     Status: Abnormal   Collection Time: 10/15/15  3:04 PM  Result Value Ref Range Status   Influenza A ([facility]) POSITIVE (A) NEGATIVE Final   Influenza B ([facility]) NEGATIVE NEGATIVE Final  Urine culture     Status: None   Collection Time: 10/15/15  5:00 PM  Result Value Ref Range Status   Specimen Description URINE, RANDOM  Final   Special Requests NONE  Final   Culture MULTIPLE SPECIES PRESENT, SUGGEST RECOLLECTION  Final   Report Status 10/17/2015 FINAL  Final  C difficile quick scan w PCR reflex     Status: None   Collection Time: 10/16/15  5:27 PM  Result Value Ref Range Status   C Diff antigen NEGATIVE NEGATIVE Final   C Diff toxin NEGATIVE NEGATIVE Final   C Diff interpretation Negative for C. difficile  Final    RADIOLOGY:  No results found.  EKG:   Orders placed or performed during the hospital encounter of 10/15/15  . ED EKG  . ED EKG  . EKG 12-Lead  . EKG 12-Lead      Management plans discussed with the patient, family and they are in  agreement.  CODE STATUS:     Code Status Orders        Start     Ordered   10/15/15 1732  Full code   Continuous     10/15/15 1733    Code Status History    Date Active Date Inactive Code Status Order ID Comments User Context   This patient has a current code status but no historical code status.      TOTAL TIME TAKING CARE OF THIS PATIENT: 45 minutes.    @[facility] @  on 10/23/2015 at 7:54 AM  Between 7am to 6pm - Pager - [phone]  After 6pm go to [email or web address] -  password [email or web address] [facility]  [facility]  Office  [phone]  CC: Primary care physician; Glendon Axe, MD

## 2015-10-30 DIAGNOSIS — G629 Polyneuropathy, unspecified: Secondary | ICD-10-CM

## 2015-10-30 DIAGNOSIS — G608 Other hereditary and idiopathic neuropathies: Secondary | ICD-10-CM | POA: Insufficient documentation

## 2015-11-22 ENCOUNTER — Ambulatory Visit: Payer: Medicare Other | Admitting: Obstetrics & Gynecology

## 2015-11-22 ENCOUNTER — Emergency Department: Payer: Medicare Other

## 2015-11-22 DIAGNOSIS — I10 Essential (primary) hypertension: Secondary | ICD-10-CM | POA: Diagnosis not present

## 2015-11-22 DIAGNOSIS — E785 Hyperlipidemia, unspecified: Secondary | ICD-10-CM | POA: Insufficient documentation

## 2015-11-22 DIAGNOSIS — Z79899 Other long term (current) drug therapy: Secondary | ICD-10-CM | POA: Insufficient documentation

## 2015-11-22 DIAGNOSIS — R079 Chest pain, unspecified: Secondary | ICD-10-CM | POA: Diagnosis present

## 2015-11-22 DIAGNOSIS — E1169 Type 2 diabetes mellitus with other specified complication: Secondary | ICD-10-CM | POA: Insufficient documentation

## 2015-11-22 DIAGNOSIS — Z794 Long term (current) use of insulin: Secondary | ICD-10-CM | POA: Insufficient documentation

## 2015-11-22 DIAGNOSIS — Z7984 Long term (current) use of oral hypoglycemic drugs: Secondary | ICD-10-CM | POA: Diagnosis not present

## 2015-11-22 LAB — BASIC METABOLIC PANEL
Anion gap: 7 (ref 5–15)
BUN: 17 mg/dL (ref 6–20)
CALCIUM: 10 mg/dL (ref 8.9–10.3)
CO2: 27 mmol/L (ref 22–32)
CREATININE: 1.03 mg/dL — AB (ref 0.44–1.00)
Chloride: 104 mmol/L (ref 101–111)
GFR, EST AFRICAN AMERICAN: 58 mL/min — AB (ref >60)
GFR, EST NON AFRICAN AMERICAN: 50 mL/min — AB (ref >60)
Glucose, Bld: 151 mg/dL — ABNORMAL HIGH (ref 65–99)
Potassium: 3.4 mmol/L — ABNORMAL LOW (ref 3.5–5.1)
SODIUM: 138 mmol/L (ref 135–145)

## 2015-11-22 LAB — CBC
HCT: 36.7 % (ref 35.0–47.0)
Hemoglobin: 12.5 g/dL (ref 12.0–16.0)
MCH: 30.7 pg (ref 26.0–34.0)
MCHC: 34 g/dL (ref 32.0–36.0)
MCV: 90.4 fL (ref 80.0–100.0)
PLATELETS: 221 10*3/uL (ref 150–440)
RBC: 4.06 MIL/uL (ref 3.80–5.20)
RDW: 13.5 % (ref 11.5–14.5)
WBC: 6.8 10*3/uL (ref 3.6–11.0)

## 2015-11-22 LAB — TROPONIN I

## 2015-11-22 NOTE — ED Notes (Signed)
Pt to triage via w/c with no distress noted; pt reports elevated BP tonight accomp by CP with left arm pain

## 2015-11-23 ENCOUNTER — Emergency Department
Admission: EM | Admit: 2015-11-23 | Discharge: 2015-11-23 | Disposition: A | Discharge status: Home / Self Care | Payer: Medicare Other | Attending: Emergency Medicine | Admitting: Emergency Medicine

## 2015-11-23 DIAGNOSIS — I10 Essential (primary) hypertension: Secondary | ICD-10-CM

## 2015-11-23 DIAGNOSIS — R079 Chest pain, unspecified: Secondary | ICD-10-CM

## 2015-11-23 LAB — TROPONIN I: Troponin I: <0.03 ng/mL (ref <0.031)

## 2015-11-23 MED ORDER — CLONIDINE HCL 0.1 MG PO TABS
ORAL_TABLET | ORAL | Status: AC
  Administered 2015-11-23: 0.1 mg via ORAL
  Filled 2015-11-23: qty 1

## 2015-11-23 MED ORDER — CLONIDINE HCL 0.1 MG PO TABS
ORAL_TABLET | ORAL | Status: AC
  Administered 2015-11-23: 0.1 mg
  Filled 2015-11-23: qty 1

## 2015-11-23 MED ORDER — CLONIDINE HCL 0.1 MG PO TABS
0.1000 mg | ORAL_TABLET | Freq: Once | ORAL | Status: AC
  Administered 2015-11-23: 0.1 mg via ORAL

## 2015-11-23 NOTE — ED Notes (Signed)
Pt came originally for chest pain and numbness in left arm that has since decreased and she is in no pain at all - she was also concerned about her elevated BP - at this time BP 229/102 and MD in the room - verbal order given for Clonidine 0.1mg  X1 and to cycle BP every 15 minutes

## 2015-11-23 NOTE — Discharge Instructions (Signed)
Nonspecific Chest Pain  °Chest pain can be caused by many different conditions. There is always a chance that your pain could be related to something serious, such as a heart attack or a blood clot in your lungs. Chest pain can also be caused by conditions that are not life-threatening. If you have chest pain, it is very important to follow up with your health care provider. °CAUSES  °Chest pain can be caused by: °· Heartburn. °· Pneumonia or bronchitis. °· Anxiety or stress. °· Inflammation around your heart (pericarditis) or lung (pleuritis or pleurisy). °· A blood clot in your lung. °· A collapsed lung (pneumothorax). It can develop suddenly on its own (spontaneous pneumothorax) or from trauma to the chest. °· Shingles infection (varicella-zoster virus). °· Heart attack. °· Damage to the bones, muscles, and cartilage that make up your chest wall. This can include: °¨ Bruised bones due to injury. °¨ Strained muscles or cartilage due to frequent or repeated coughing or overwork. °¨ Fracture to one or more ribs. °¨ Sore cartilage due to inflammation (costochondritis). °RISK FACTORS  °Risk factors for chest pain may include: °· Activities that increase your risk for trauma or injury to your chest. °· Respiratory infections or conditions that cause frequent coughing. °· Medical conditions or overeating that can cause heartburn. °· Heart disease or family history of heart disease. °· Conditions or health behaviors that increase your risk of developing a blood clot. °· Having had chicken pox (varicella zoster). °SIGNS AND SYMPTOMS °Chest pain can feel like: °· Burning or tingling on the surface of your chest or deep in your chest. °· Crushing, pressure, aching, or squeezing pain. °· Dull or sharp pain that is worse when you move, cough, or take a deep breath. °· Pain that is also felt in your back, neck, shoulder, or arm, or pain that spreads to any of these areas. °Your chest pain may come and go, or it may stay  constant. °DIAGNOSIS °Lab tests or other studies may be needed to find the cause of your pain. Your health care provider may have you take a test called an ambulatory ECG (electrocardiogram). An ECG records your heartbeat patterns at the time the test is performed. You may also have other tests, such as: °· Transthoracic echocardiogram (TTE). During echocardiography, sound waves are used to create a picture of all of the heart structures and to look at how blood flows through your heart. °· Transesophageal echocardiogram (TEE). This is a more advanced imaging test that obtains images from inside your body. It allows your health care provider to see your heart in finer detail. °· Cardiac monitoring. This allows your health care provider to monitor your heart rate and rhythm in real time. °· Holter monitor. This is a portable device that records your heartbeat and can help to diagnose abnormal heartbeats. It allows your health care provider to track your heart activity for several days, if needed. °· Stress tests. These can be done through exercise or by taking medicine that makes your heart beat more quickly. °· Blood tests. °· Imaging tests. °TREATMENT  °Your treatment depends on what is causing your chest pain. Treatment may include: °· Medicines. These may include: °¨ Acid blockers for heartburn. °¨ Anti-inflammatory medicine. °¨ Pain medicine for inflammatory conditions. °¨ Antibiotic medicine, if an infection is present. °¨ Medicines to dissolve blood clots. °¨ Medicines to treat coronary artery disease. °· Supportive care for conditions that do not require medicines. This may include: °¨ Resting. °¨ Applying heat   or cold packs to injured areas. °¨ Limiting activities until pain decreases. °HOME CARE INSTRUCTIONS °· If you were prescribed an antibiotic medicine, finish it all even if you start to feel better. °· Avoid any activities that bring on chest pain. °· Do not use any tobacco products, including  cigarettes, chewing tobacco, or electronic cigarettes. If you need help quitting, ask your health care provider. °· Do not drink alcohol. °· Take medicines only as directed by your health care provider. °· Keep all follow-up visits as directed by your health care provider. This is important. This includes any further testing if your chest pain does not go away. °· If heartburn is the cause for your chest pain, you may be told to keep your head raised (elevated) while sleeping. This reduces the chance that acid will go from your stomach into your esophagus. °· Make lifestyle changes as directed by your health care provider. These may include: °¨ Getting regular exercise. Ask your health care provider to suggest some activities that are safe for you. °¨ Eating a heart-healthy diet. A registered dietitian can help you to learn healthy eating options. °¨ Maintaining a healthy weight. °¨ Managing diabetes, if necessary. °¨ Reducing stress. °SEEK MEDICAL CARE IF: °· Your chest pain does not go away after treatment. °· You have a rash with blisters on your chest. °· You have a fever. °SEEK IMMEDIATE MEDICAL CARE IF:  °· Your chest pain is worse. °· You have an increasing cough, or you cough up blood. °· You have severe abdominal pain. °· You have severe weakness. °· You faint. °· You have chills. °· You have sudden, unexplained chest discomfort. °· You have sudden, unexplained discomfort in your arms, back, neck, or jaw. °· You have shortness of breath at any time. °· You suddenly start to sweat, or your skin gets clammy. °· You feel nauseous or you vomit. °· You suddenly feel light-headed or dizzy. °· Your heart begins to beat quickly, or it feels like it is skipping beats. °These symptoms may represent a serious problem that is an emergency. Do not wait to see if the symptoms will go away. Get medical help right away. Call your local emergency services (911 in the [place] Do not drive yourself to the hospital. °  °This  information is not intended to replace advice given to you by your health care provider. Make sure you discuss any questions you have with your health care provider. °  °Document Released: 05/01/2005 Document Revised: 08/12/2014 Document Reviewed: 02/25/2014 °[employer] Interactive Patient Education ©2016 [employer] ° °

## 2015-11-23 NOTE — ED Provider Notes (Signed)
[facility] Emergency Department Provider Note  ____________________________________________  Time seen: 12:15 AM  I have reviewed the triage vital signs and the nursing notes.   HISTORY  Chief Complaint Chest Pain      HPI Dawn Mcintyre is a 82 y.o. female history of hypertension presents to emergency department with elevated blood pressure tonight accompanied by central chest pain with radiation into her left arm. Patient states that she has no pain at present that it spontaneously resolved.        Past Medical History  Diagnosis Date  . Arthritis   . Ulcer   . Pneumonia      x 79yrs ago  . Hyperlipidemia   . Hypertension   . Diabetes mellitus     border line  . Cataract   . Osteoporosis   . Atrophic vaginitis 11/16/2014    Patient Active Problem List   Diagnosis Date Noted  . Influenza 10/15/2015  . Atrophic vaginitis 11/16/2014    Past Surgical History  Procedure Laterality Date  . Throat surgery      x 10 yrs ago. throat mass.  . Abdominal hysterectomy  1972  . Eye surgery      Current Outpatient Rx  Name  Route  Sig  Dispense  Refill  . acetaminophen (TYLENOL) 325 MG tablet   Oral   Take 2 tablets (650 mg total) by mouth every 6 (six) hours as needed for mild pain (or Fever >/= 101).         Marland Kitchen acidophilus (RISAQUAD) CAPS capsule   Oral   Take 2 capsules by mouth 3 (three) times daily.   30 capsule   0   . ALPRAZolam (XANAX) 0.25 MG tablet   Oral   Take 0.25 mg by mouth at bedtime as needed.           Marland Kitchen aspirin 81 MG tablet   Oral   Take 81 mg by mouth daily.           . Cranberry 400 MG CAPS   Oral   Take 1 tablet by mouth 2 (two) times daily.         . cyanocobalamin (,VITAMIN B-12,) 1000 MCG/ML injection   Intramuscular   Inject 1 mL into the muscle every 30 (thirty) days.         . fexofenadine (ALLEGRA) 180 MG tablet   Oral   Take 1 tablet by mouth as needed.         . fluticasone  (FLONASE) 50 MCG/ACT nasal spray   Nasal   Place 2 sprays into the nose daily.         Marland Kitchen gabapentin (NEURONTIN) 100 MG capsule   Oral   Take 100 mg by mouth 3 (three) times daily.           Marland Kitchen guaiFENesin-dextromethorphan (ROBITUSSIN DM) 100-10 MG/5ML syrup   Oral   Take 5 mLs by mouth every 6 (six) hours as needed for cough.   118 mL   0   . isosorbide mononitrate (IMDUR) 60 MG 24 hr tablet   Oral   Take 60 mg by mouth daily.           Marland Kitchen levofloxacin (LEVAQUIN) 750 MG tablet   Oral   Take 1 tablet (750 mg total) by mouth every other day.   5 tablet   0   . metoprolol tartrate (LOPRESSOR) 25 MG tablet   Oral   Take 1 tablet (25 mg  total) by mouth 2 (two) times daily.   60 tablet   0   . pantoprazole (PROTONIX) 40 MG tablet   Oral   Take 1 tablet by mouth daily.         . predniSONE (DELTASONE) 2.5 MG tablet   Oral   Take 2.5 mg by mouth daily.          . valsartan-hydrochlorothiazide (DIOVAN-HCT) 160-12.5 MG per tablet   Oral   Take 1 tablet by mouth daily.            Allergies Amoxicillin-pot clavulanate; Codeine; Hydrocodone; Metronidazole; Propoxyphene; and Latex  Family History  Problem Relation Age of Onset  . Anuerysm Mother   . Cancer Father     prostrate cancer  . Diabetes Brother   . Hearing loss Brother     Social History Social History  Substance Use Topics  . Smoking status: Never Smoker   . Smokeless tobacco: Never Used  . Alcohol Use: No    Review of Systems  Constitutional: Negative for fever. Eyes: Negative for visual changes. ENT: Negative for sore throat. Cardiovascular: Positive for chest pain. Respiratory: Negative for shortness of breath. Gastrointestinal: Negative for abdominal pain, vomiting and diarrhea. Genitourinary: Negative for dysuria. Musculoskeletal: Negative for back pain. Skin: Negative for rash. Neurological: Negative for headaches, focal weakness or numbness.   10-point ROS otherwise  negative.  ____________________________________________   PHYSICAL EXAM:  VITAL SIGNS: ED Triage Vitals  Enc Vitals Group     BP 11/22/15 2140 193/67 mmHg     Pulse Rate 11/22/15 2140 67     Resp 11/22/15 2140 18     Temp 11/22/15 2140 97.8 F (36.6 C)     Temp Source 11/22/15 2140 Oral     SpO2 11/22/15 2140 95 %     Weight 11/22/15 2140 149 lb (67.586 kg)     Height 11/22/15 2140 5\' 7"  (1.702 m)     Head Cir --      Peak Flow --      Pain Score 11/22/15 2140 2     Pain Loc --      Pain Edu? --      Excl. in [place]? --      Constitutional: Alert and oriented. Well appearing and in no distress. Eyes: Conjunctivae are normal. PERRL. Normal extraocular movements. ENT   Head: Normocephalic and atraumatic.   Nose: No congestion/rhinnorhea.   Mouth/Throat: Mucous membranes are moist.   Neck: No stridor. Hematological/Lymphatic/Immunilogical: No cervical lymphadenopathy. Cardiovascular: Normal rate, regular rhythm. Normal and symmetric distal pulses are present in all extremities. No murmurs, rubs, or gallops. Respiratory: Normal respiratory effort without tachypnea nor retractions. Breath sounds are clear and equal bilaterally. No wheezes/rales/rhonchi. Gastrointestinal: Soft and nontender. No distention. There is no CVA tenderness. Genitourinary: deferred Musculoskeletal: Nontender with normal range of motion in all extremities. No joint effusions.  No lower extremity tenderness nor edema. Neurologic:  Normal speech and language. No gross focal neurologic deficits are appreciated. Speech is normal.  Skin:  Skin is warm, dry and intact. No rash noted. Psychiatric: Mood and affect are normal. Speech and behavior are normal. Patient exhibits appropriate insight and judgment.  ____________________________________________    LABS (pertinent positives/negatives)  Labs Reviewed  BASIC METABOLIC PANEL - Abnormal; Notable for the following:    Potassium 3.4 (*)     Glucose, Bld 151 (*)    Creatinine, Ser 1.03 (*)    GFR calc non Af Amer 50 (*)  GFR calc Af Amer 58 (*)    All other components within normal limits  CBC  TROPONIN I  TROPONIN I     ____________________________________________   EKG  ED ECG REPORT I, Roscoe N Jennifermarie Franzen, the attending physician, personally viewed and interpreted this ECG.   Date: 11/23/2015  EKG Time: 9:39 PM  Rate: 65  Rhythm: Normal sinus rhythm  Axis: Normal  Intervals: Normal  ST&T Change: None   ____________________________________________    RADIOLOGY  DG Chest 2 View (Final result) Result time: 11/22/15 22:00:08   Final result by Rad Results In Interface (11/22/15 22:00:08)   Narrative:   CLINICAL DATA: Left-sided chest pain, headache, and left arm pain. Elevated blood pressure. History of hypertension and diabetes.  EXAM: CHEST 2 VIEW  COMPARISON: 10/15/2015  FINDINGS: Shallow inspiration with elevation of the left hemidiaphragm. Atelectasis in the left lung base. No focal consolidation. No blunting of costophrenic angles. No pneumothorax. Normal heart size and pulmonary vascularity. Mediastinal contours appear intact.  IMPRESSION: Elevation of left hemidiaphragm with atelectasis of the left lung base.   Electronically Signed By: Lucienne Capers M.D. On: 11/22/2015 22:00        ECG Results        INITIAL IMPRESSION / ASSESSMENT AND PLAN / ED COURSE  Pertinent labs & imaging results that were available during my care of the patient were reviewed by me and considered in my medical decision making (see chart for details).  Patient chest pain-free at the time of my evaluation and remained as such throughout her emergency department stay. Patient received 0.2 mg of clonidine with resultant blood pressure 152/76. EKG revealed no ST segment ranges cardiac enzymes negative 2. I discussed with the patient at length importance of following up with Dr. Candiss Norse today for  further appropriate blood pressure control.  ____________________________________________   FINAL CLINICAL IMPRESSION(S) / ED DIAGNOSES  Final diagnoses:  Essential hypertension  Chest pain, unspecified chest pain type      Gregor Hams, MD 11/23/15 [phone]

## 2015-11-24 DIAGNOSIS — F419 Anxiety disorder, unspecified: Secondary | ICD-10-CM | POA: Insufficient documentation

## 2016-03-25 DIAGNOSIS — N183 Chronic kidney disease, stage 3 (moderate): Secondary | ICD-10-CM

## 2016-03-25 DIAGNOSIS — G629 Polyneuropathy, unspecified: Secondary | ICD-10-CM

## 2016-03-25 DIAGNOSIS — G608 Other hereditary and idiopathic neuropathies: Secondary | ICD-10-CM | POA: Insufficient documentation

## 2016-03-25 DIAGNOSIS — N1831 Chronic kidney disease, stage 3a: Secondary | ICD-10-CM | POA: Insufficient documentation

## 2016-03-25 DIAGNOSIS — E1122 Type 2 diabetes mellitus with diabetic chronic kidney disease: Secondary | ICD-10-CM | POA: Insufficient documentation

## 2016-04-01 ENCOUNTER — Other Ambulatory Visit: Payer: Self-pay | Admitting: Advanced Practice Midwife

## 2016-04-01 NOTE — Telephone Encounter (Signed)
Will need to be evaluated in office if symptoms persist

## 2016-04-30 ENCOUNTER — Ambulatory Visit: Payer: Self-pay | Admitting: Urology

## 2016-05-17 ENCOUNTER — Other Ambulatory Visit (INDEPENDENT_AMBULATORY_CARE_PROVIDER_SITE_OTHER): Payer: Medicare Other | Admitting: *Deleted

## 2016-05-17 DIAGNOSIS — R300 Dysuria: Secondary | ICD-10-CM | POA: Diagnosis not present

## 2016-05-17 LAB — POCT URINALYSIS DIPSTICK
Bilirubin, UA: NEGATIVE
Glucose, UA: NEGATIVE
KETONES UA: NEGATIVE
Nitrite, UA: NEGATIVE
SPEC GRAV UA: 1.015
Urobilinogen, UA: 0.2
pH, UA: 6

## 2016-05-17 MED ORDER — SULFAMETHOXAZOLE-TRIMETHOPRIM 800-160 MG PO TABS: 1.0000 | ORAL_TABLET | Freq: Two times a day (BID) | ORAL | 0 refills | Status: DC

## 2016-05-17 NOTE — Progress Notes (Signed)
Pt here today c/o pain with urination that usually occurs after voiding.  UA + blood, pt denies any lower back or side pain.  Will send urine cx for verification.  Bactrim sent to pharmacy, instructed pt on medication use and encouraged to increase water intake.

## 2016-05-19 LAB — URINE CULTURE

## 2016-05-30 ENCOUNTER — Ambulatory Visit (INDEPENDENT_AMBULATORY_CARE_PROVIDER_SITE_OTHER): Payer: Medicare Other | Admitting: Urology

## 2016-05-30 ENCOUNTER — Encounter: Payer: Self-pay | Admitting: Urology

## 2016-05-30 VITALS — BP 134/69 | HR 59 | Ht 67.0 in | Wt 148.2 lb

## 2016-05-30 DIAGNOSIS — N952 Postmenopausal atrophic vaginitis: Secondary | ICD-10-CM | POA: Diagnosis not present

## 2016-05-30 DIAGNOSIS — R3129 Other microscopic hematuria: Secondary | ICD-10-CM

## 2016-05-30 DIAGNOSIS — R102 Pelvic and perineal pain: Secondary | ICD-10-CM | POA: Diagnosis not present

## 2016-05-30 LAB — URINALYSIS, COMPLETE
BILIRUBIN UA: NEGATIVE
GLUCOSE, UA: NEGATIVE
LEUKOCYTES UA: NEGATIVE
Nitrite, UA: NEGATIVE
PH UA: 5.5 (ref 5.0–7.5)
PROTEIN UA: NEGATIVE
RBC UA: NEGATIVE
Specific Gravity, UA: 1.025 (ref 1.005–1.030)
UUROB: 0.2 mg/dL (ref 0.2–1.0)

## 2016-05-30 LAB — MICROSCOPIC EXAMINATION
BACTERIA UA: NONE SEEN
Epithelial Cells (non renal): >10 /hpf — AB (ref 0–10)

## 2016-05-30 NOTE — Progress Notes (Signed)
05/30/2016 2:28 PM   Dawn Mcintyre Dawn Mcintyre 02-06-1933 [id number]  Referring provider: Glendon Axe, MD [facility] [place], [place] [address]  Chief Complaint  Patient presents with  . New Patient (Initial Visit)    Hematuria    HPI: Patient is a 82 -year-old [place] American female who presents today as a referral from their PCP, Dr. Candiss Norse, for microscopic hematuria.   Patient was found to have microscopic hematuria on four occasion with 0-10 RBC's/hpf.  Only one was associated with an UTI.  Patient does have a prior history of microscopic hematuria.    She does not have a prior history of recurrent urinary tract infections, nephrolithiasis, trauma to the genitourinary tract or malignancies of the genitourinary tract.   She does not have a family medical history of nephrolithiasis, malignancies of the genitourinary tract or hematuria.   Today, she is having symptoms of frequent urination, dysuria, nocturia and incontinence.  Her UA today is unremarkable.    She is experiencing any suprapubic pain, but she denies abdominal pain and flank pain.  She denies any recent fevers, chills, nausea or vomiting.   CT scan in 2014 noted a 9 mm low-density structure in her left kidney that was too small to characterize.    She is not a smoker.  She is not exposed to secondhand smoke.  She has not worked with [employer].    PMH: Past Medical History:  Diagnosis Date  . Arthritis   . Atrophic vaginitis 11/16/2014  . Cataract   . Diabetes mellitus    border line  . Hyperlipidemia   . Hypertension   . Osteoporosis   . Pneumonia     x 55yrs ago  . Ulcer [facility])     Surgical History: Past Surgical History:  Procedure Laterality Date  . ABDOMINAL HYSTERECTOMY  1972  . EYE SURGERY    . THROAT SURGERY     x 10 yrs ago. throat mass.    Home Medications:    Medication List       Accurate as of 05/30/16  2:28 PM. Always use your most recent med  list.          acetaminophen 325 MG tablet Commonly known as:  TYLENOL Take 2 tablets (650 mg total) by mouth every 6 (six) hours as needed for mild pain (or Fever >/= 101).   acidophilus Caps capsule Take 2 capsules by mouth 3 (three) times daily.   ALPRAZolam 0.25 MG tablet Commonly known as:  XANAX Take 0.25 mg by mouth at bedtime as needed.   aspirin 81 MG tablet Take 81 mg by mouth daily.   Cranberry 400 MG Caps Take 1 tablet by mouth 2 (two) times daily.   fexofenadine 180 MG tablet Commonly known as:  ALLEGRA Take 1 tablet by mouth as needed.   fluticasone 50 MCG/ACT nasal spray Commonly known as:  FLONASE Place 2 sprays into the nose daily.   gabapentin 100 MG capsule Commonly known as:  NEURONTIN Take 100 mg by mouth 3 (three) times daily.   guaiFENesin-dextromethorphan 100-10 MG/5ML syrup Commonly known as:  ROBITUSSIN DM Take 5 mLs by mouth every 6 (six) hours as needed for cough.   isosorbide mononitrate 60 MG 24 hr tablet Commonly known as:  IMDUR Take 60 mg by mouth daily.   levofloxacin 750 MG tablet Commonly known as:  LEVAQUIN Take 1 tablet (750 mg total) by mouth every other day.   metoprolol tartrate 25  MG tablet Commonly known as:  LOPRESSOR Take 1 tablet (25 mg total) by mouth 2 (two) times daily.   pantoprazole 40 MG tablet Commonly known as:  PROTONIX Take 1 tablet by mouth daily.   predniSONE 2.5 MG tablet Commonly known as:  DELTASONE Take 2.5 mg by mouth daily.   PREMARIN vaginal cream Generic drug:  conjugated estrogens USE 1 GRAM VAGINALLY EVERY OTHER NIGHT   sulfamethoxazole-trimethoprim 800-160 MG tablet Commonly known as:  BACTRIM DS,SEPTRA DS Take 1 tablet by mouth 2 (two) times daily.   valsartan-hydrochlorothiazide 160-12.5 MG tablet Commonly known as:  DIOVAN-HCT Take 1 tablet by mouth daily.       Allergies:  Allergies  Allergen Reactions  . Amoxicillin-Pot Clavulanate Other (See Comments)  . Codeine  Hives  . Hydrocodone Nausea Only  . Metronidazole Nausea Only  . Propoxyphene Nausea Only  . Latex Rash    Family History: Family History  Problem Relation Age of Onset  . Anuerysm Mother   . Cancer Father     prostrate cancer  . Diabetes Brother   . Hearing loss Brother   . Bladder Cancer Neg Hx   . Kidney cancer Neg Hx     Social History:  reports that she has never smoked. She has never used smokeless tobacco. She reports that she does not drink alcohol or use drugs.  ROS: UROLOGY Frequent Urination?: Yes Hard to postpone urination?: No Burning/pain with urination?: Yes Get up at night to urinate?: Yes Leakage of urine?: Yes Urine stream starts and stops?: No Trouble starting stream?: No Do you have to strain to urinate?: No Blood in urine?: Yes Urinary tract infection?: Yes Sexually transmitted disease?: No Injury to kidneys or bladder?: No Painful intercourse?: No Weak stream?: No Currently pregnant?: No Vaginal bleeding?: No Last menstrual period?: n  Gastrointestinal Nausea?: No Vomiting?: No Indigestion/heartburn?: No Diarrhea?: No Constipation?: No  Constitutional Fever: No Night sweats?: No Weight loss?: No Fatigue?: No  Skin Skin rash/lesions?: No Itching?: No  Eyes Blurred vision?: No Double vision?: No  Ears/Nose/Throat Sore throat?: No Sinus problems?: No  Hematologic/Lymphatic Swollen glands?: No Easy bruising?: No  Cardiovascular Leg swelling?: No Chest pain?: No  Respiratory Cough?: No Shortness of breath?: No  Endocrine Excessive thirst?: No  Musculoskeletal Back pain?: No Joint pain?: No  Neurological Headaches?: No Dizziness?: No  Psychologic Depression?: No Anxiety?: No  Physical Exam: BP 134/69 (BP Location: Right Arm, Patient Position: Sitting, Cuff Size: Normal)   Pulse (!) 59   Ht 5\' 7"  (1.702 m)   Wt 148 lb 3.2 oz (67.2 kg)   BMI 23.21 kg/m   Constitutional: Well nourished. Alert and  oriented, No acute distress. HEENT: [place] AT, moist mucus membranes. Trachea midline, no masses. Cardiovascular: No clubbing, cyanosis, or edema. Respiratory: Normal respiratory effort, no increased work of breathing. GI: Abdomen is soft, non tender, non distended, no abdominal masses. Liver and spleen not palpable.  No hernias appreciated.  Stool sample for occult testing is not indicated.   GU: No CVA tenderness.  No bladder fullness or masses.  Atrophic external genitalia, normal pubic hair distribution, no lesions.  Normal urethral meatus, no lesions, no prolapse, no discharge.   No urethral masses, tenderness and/or tenderness. No bladder fullness, tenderness or masses. Pale vagina mucosa, poor estrogen effect, no discharge, no lesions, good pelvic support, no cystocele or rectocele noted.  Cervix, uterus and adnexa are surgically absent.  Anus and perineum are without rashes or lesions. Skin: No rashes, bruises  or suspicious lesions. Lymph: No cervical or inguinal adenopathy. Neurologic: Grossly intact, no focal deficits, moving all 4 extremities. Psychiatric: Normal mood and affect.  Laboratory Data: Lab Results  Component Value Date   WBC 6.8 11/22/2015   HGB 12.5 11/22/2015   HCT 36.7 11/22/2015   MCV 90.4 11/22/2015   PLT 221 11/22/2015    Lab Results  Component Value Date   CREATININE 1.03 (H) 11/22/2015    Lab Results  Component Value Date   AST 35 10/15/2015   Lab Results  Component Value Date   ALT 17 10/15/2015     Urinalysis Unremarkable.  See EPIC.   Assessment & Plan:    1. Microscopic hematuria   I explained to the patient that there are a number of causes that can be associated with blood in the urine, such as stones, UTI's, damage to the urinary tract and/or cancer.  - At this time, I felt that the patient warranted further urologic evaluation.   The AUA guidelines state that a CT urogram is the preferred imaging study to evaluate hematuria.  - I  explained to the patient that a contrast material will be injected into a vein and that in rare instances, an allergic reaction can result and may even life threatening   The patient denies any allergies to contrast, iodine and/or seafood and is not taking metformin.  - Her reproductive status is post hysterectomy.  - Following the imaging study,  I've recommended a cystoscopy. I described how this is performed, typically in an office setting with a flexible cystoscope. We described the risks, benefits, and possible side effects, the most common of which is a minor amount of blood in the urine and/or burning which usually resolves in 24 to 48 hours.    - The patient had the opportunity to ask questions which were answered. Based upon this discussion, the patient is willing to proceed. Therefore, I've ordered: a CT Urogram and cystoscopy.  - She will return following all of the above for discussion of the results.     - Urinalysis, Complete  - CULTURE, URINE COMPREHENSIVE  - BUN+Creat  2. Suprapubic pain  - see above  3. Vaginal atrophy  - Patient has been on vaginal estrogen cream and has been applying it sporadically  - Given samples of Premarin cream, instructed to apply it three night weekly   Return for CT Urogram report and cystoscopy.  These notes generated with voice recognition software. I apologize for typographical errors.  Zara Council, [facility] [address], [place], [place] [address] [phone]

## 2016-05-31 LAB — BUN+CREAT
BUN / CREAT RATIO: 18 (ref 12–28)
BUN: 20 mg/dL (ref 8–27)
Creatinine, Ser: 1.1 mg/dL — ABNORMAL HIGH (ref 0.57–1.00)
GFR, EST AFRICAN AMERICAN: 54 mL/min/{1.73_m2} — AB (ref >59)
GFR, EST NON AFRICAN AMERICAN: 47 mL/min/{1.73_m2} — AB (ref >59)

## 2016-06-02 LAB — CULTURE, URINE COMPREHENSIVE

## 2016-06-07 ENCOUNTER — Other Ambulatory Visit: Payer: Self-pay | Admitting: Urology

## 2016-06-07 ENCOUNTER — Telehealth: Payer: Self-pay | Admitting: Urology

## 2016-06-07 MED ORDER — ESTROGENS, CONJUGATED 0.625 MG/GM VA CREA: 1.0000 | TOPICAL_CREAM | Freq: Every day | VAGINAL | 12 refills | Status: DC

## 2016-06-07 NOTE — Telephone Encounter (Signed)
It cannot be escribed.  I have printed it out and will have to sign it on Monday.

## 2016-06-07 NOTE — Telephone Encounter (Signed)
Patient is asking for a script for premarin cream.   Thanks,  Sharyn Lull

## 2016-06-07 NOTE — Telephone Encounter (Signed)
Ok thanks 

## 2016-06-17 ENCOUNTER — Ambulatory Visit
Admission: RE | Admit: 2016-06-17 | Discharge: 2016-06-17 | Disposition: A | Discharge status: Home / Self Care | Payer: Medicare Other | Source: Ambulatory Visit | Attending: Urology | Admitting: Urology

## 2016-06-17 DIAGNOSIS — R3129 Other microscopic hematuria: Secondary | ICD-10-CM | POA: Diagnosis present

## 2016-06-17 DIAGNOSIS — I700 Atherosclerosis of aorta: Secondary | ICD-10-CM | POA: Diagnosis not present

## 2016-06-17 MED ORDER — IOPAMIDOL (ISOVUE-300) INJECTION 61%
100.0000 mL | Freq: Once | INTRAVENOUS | Status: AC | PRN
  Administered 2016-06-17: 100 mL via INTRAVENOUS

## 2016-06-20 ENCOUNTER — Encounter: Payer: Self-pay | Admitting: Urology

## 2016-06-20 ENCOUNTER — Ambulatory Visit: Payer: Medicare Other | Admitting: Urology

## 2016-06-20 VITALS — BP 140/75 | HR 61 | Ht 65.0 in | Wt 148.2 lb

## 2016-06-20 DIAGNOSIS — N952 Postmenopausal atrophic vaginitis: Secondary | ICD-10-CM | POA: Diagnosis not present

## 2016-06-20 DIAGNOSIS — N3281 Overactive bladder: Secondary | ICD-10-CM | POA: Diagnosis not present

## 2016-06-20 DIAGNOSIS — R3129 Other microscopic hematuria: Secondary | ICD-10-CM | POA: Diagnosis not present

## 2016-06-20 LAB — URINALYSIS, COMPLETE
Bilirubin, UA: NEGATIVE
GLUCOSE, UA: NEGATIVE
KETONES UA: NEGATIVE
LEUKOCYTES UA: NEGATIVE
Nitrite, UA: NEGATIVE
Protein, UA: NEGATIVE
RBC, UA: NEGATIVE
SPEC GRAV UA: 1.01 (ref 1.005–1.030)
Urobilinogen, Ur: 0.2 mg/dL (ref 0.2–1.0)
pH, UA: 7 (ref 5.0–7.5)

## 2016-06-20 LAB — MICROSCOPIC EXAMINATION: Epithelial Cells (non renal): >10 /hpf — AB (ref 0–10)

## 2016-06-20 MED ORDER — MIRABEGRON ER 25 MG PO TB24: 25.0000 mg | ORAL_TABLET | Freq: Every day | ORAL | 11 refills | Status: DC

## 2016-06-20 MED ORDER — CIPROFLOXACIN HCL 500 MG PO TABS
500.0000 mg | ORAL_TABLET | Freq: Once | ORAL | Status: AC
  Administered 2016-06-20: 500 mg via ORAL

## 2016-06-20 MED ORDER — LIDOCAINE HCL 2 % EX GEL
1.0000 "application " | Freq: Once | CUTANEOUS | Status: AC
  Administered 2016-06-20: 1 via URETHRAL

## 2016-06-20 NOTE — Progress Notes (Signed)
06/20/2016 11:17 AM   Dawn Mcintyre 1932-09-03 [id number]  Referring provider: Glendon Axe, MD [facility] [place], [place] [address]  Chief Complaint  Patient presents with  . Cysto    microscopic heamturia    HPI: Patient is a 82 -year-old [place] American female who presents today as a referral from their PCP, Dr. Candiss Norse, for microscopic hematuria.   Patient was found to have microscopic hematuria on four occasion with 0-10 RBC's/hpf.  Only one was associated with an UTI.  Patient does have a prior history of microscopic hematuria.    She does not have a prior history of recurrent urinary tract infections, nephrolithiasis, trauma to the genitourinary tract or malignancies of the genitourinary tract.   Negative CT urogram. Presents today for cystoscopy.    She is on vaginal estrogen cream for vaginal atrophy.  Her biggest complaint today is urinary urgency with urge incontinence that happens on a daily basis. She has nocturia 3 or 4. Chest notes that every time she passed the bathroom she will sit down to urinate. She is very bothered by this. She has never tried medication for this before.  PMH: Past Medical History:  Diagnosis Date  . Arthritis   . Atrophic vaginitis 11/16/2014  . Cataract   . Diabetes mellitus    border line. Pt currently is not on meds.   . Hyperlipidemia   . Hypertension   . Osteoporosis   . Pneumonia     x 4yrs ago  . Ulcer [facility])     Surgical History: Past Surgical History:  Procedure Laterality Date  . ABDOMINAL HYSTERECTOMY  1972  . EYE SURGERY    . THROAT SURGERY     x 10 yrs ago. throat mass.    Home Medications:    Medication List       Accurate as of 06/20/16 11:17 AM. Always use your most recent med list.          acetaminophen 325 MG tablet Commonly known as:  TYLENOL Take 2 tablets (650 mg total) by mouth every 6 (six) hours as needed for mild pain (or Fever >/= 101).   acidophilus  Caps capsule Take 2 capsules by mouth 3 (three) times daily.   ALPRAZolam 0.25 MG tablet Commonly known as:  XANAX Take 0.25 mg by mouth at bedtime as needed.   aspirin 81 MG tablet Take 81 mg by mouth daily.   Cranberry 400 MG Caps Take 1 tablet by mouth 2 (two) times daily.   fexofenadine 180 MG tablet Commonly known as:  ALLEGRA Take 1 tablet by mouth as needed.   fluticasone 50 MCG/ACT nasal spray Commonly known as:  FLONASE Place 2 sprays into the nose daily.   gabapentin 100 MG capsule Commonly known as:  NEURONTIN Take 100 mg by mouth 3 (three) times daily.   guaiFENesin-dextromethorphan 100-10 MG/5ML syrup Commonly known as:  ROBITUSSIN DM Take 5 mLs by mouth every 6 (six) hours as needed for cough.   isosorbide mononitrate 60 MG 24 hr tablet Commonly known as:  IMDUR Take 60 mg by mouth daily.   levofloxacin 750 MG tablet Commonly known as:  LEVAQUIN Take 1 tablet (750 mg total) by mouth every other day.   metoprolol tartrate 25 MG tablet Commonly known as:  LOPRESSOR Take 1 tablet (25 mg total) by mouth 2 (two) times daily.   mirabegron ER 25 MG Tb24 tablet Commonly known as:  MYRBETRIQ Take 1 tablet (25 mg total)  by mouth daily.   pantoprazole 40 MG tablet Commonly known as:  PROTONIX Take 1 tablet by mouth daily.   predniSONE 2.5 MG tablet Commonly known as:  DELTASONE Take 2.5 mg by mouth daily.   PREMARIN vaginal cream Generic drug:  conjugated estrogens USE 1 GRAM VAGINALLY EVERY OTHER NIGHT   conjugated estrogens vaginal cream Commonly known as:  PREMARIN Place 1 Applicatorful vaginally daily. Apply 0.5mg  (pea-sized amount)  just inside the vaginal introitus with a finger-tip every night for two weeks and then Monday, Wednesday and Friday nights.   sulfamethoxazole-trimethoprim 800-160 MG tablet Commonly known as:  BACTRIM DS,SEPTRA DS Take 1 tablet by mouth 2 (two) times daily.   valsartan-hydrochlorothiazide 160-12.5 MG  tablet Commonly known as:  DIOVAN-HCT Take 1 tablet by mouth daily.       Allergies:  Allergies  Allergen Reactions  . Amoxicillin-Pot Clavulanate Other (See Comments)  . Codeine Hives  . Hydrocodone Nausea Only  . Metronidazole Nausea Only  . Propoxyphene Nausea Only  . Latex Rash    Family History: Family History  Problem Relation Age of Onset  . Anuerysm Mother   . Cancer Father     prostrate cancer  . Diabetes Brother   . Hearing loss Brother   . Bladder Cancer Neg Hx   . Kidney cancer Neg Hx     Social History:  reports that she has never smoked. She has never used smokeless tobacco. She reports that she does not drink alcohol or use drugs.  ROS:                                        Physical Exam: BP 140/75   Pulse 61   Ht 5\' 5"  (1.651 m)   Wt 148 lb 3.2 oz (67.2 kg)   BMI 24.66 kg/m   Constitutional:  Alert and oriented, No acute distress. HEENT: [place] AT, moist mucus membranes.  Trachea midline, no masses. Cardiovascular: No clubbing, cyanosis, or edema. Respiratory: Normal respiratory effort, no increased work of breathing. GI: Abdomen is soft, nontender, nondistended, no abdominal masses GU: No CVA tenderness.  Skin: No rashes, bruises or suspicious lesions. Lymph: No cervical or inguinal adenopathy. Neurologic: Grossly intact, no focal deficits, moving all 4 extremities. Psychiatric: Normal mood and affect.  Laboratory Data: Lab Results  Component Value Date   WBC 6.8 11/22/2015   HGB 12.5 11/22/2015   HCT 36.7 11/22/2015   MCV 90.4 11/22/2015   PLT 221 11/22/2015    Lab Results  Component Value Date   CREATININE 1.10 (H) 05/30/2016    No results found for: PSA  No results found for: TESTOSTERONE  No results found for: HGBA1C  Urinalysis    Component Value Date/Time   COLORURINE YELLOW (A) 10/15/2015 1700   APPEARANCEUR Clear 05/30/2016 1358   LABSPEC 1.012 10/15/2015 1700   LABSPEC 1.026 09/12/2011 2304    PHURINE 6.0 10/15/2015 1700   GLUCOSEU Negative 05/30/2016 1358   GLUCOSEU Negative 09/12/2011 2304   HGBUR 1+ (A) 10/15/2015 1700   BILIRUBINUR Negative 05/30/2016 1358   BILIRUBINUR Negative 09/12/2011 2304   KETONESUR NEGATIVE 10/15/2015 1700   PROTEINUR Negative 05/30/2016 1358   PROTEINUR 30 (A) 10/15/2015 1700   UROBILINOGEN 0.2 05/17/2016 0904   NITRITE Negative 05/30/2016 1358   NITRITE NEGATIVE 10/15/2015 1700   LEUKOCYTESUR Negative 05/30/2016 1358   LEUKOCYTESUR 2+ 09/12/2011 2304    Pertinent  Imaging: CT Urogram reviewed. No etiology for hematuria.  Cystoscopy Procedure Note  Patient identification was confirmed, informed consent was obtained, and patient was prepped using Betadine solution.  Lidocaine jelly was administered per urethral meatus.    Preoperative abx where received prior to procedure.    Procedure: - Flexible cystoscope introduced, without any difficulty.   - Thorough search of the bladder revealed:    normal urethral meatus    normal urothelium    no stones    no ulcers     no tumors    no urethral polyps    no trabeculation  - Ureteral orifices were normal in position and appearance.  Post-Procedure: - Patient tolerated the procedure well   Assessment & Plan:   1. Microscopic hematuria -Negative work up. Follow up in 1 year for repeat urinalysis.  2. Vaginal atrophy -continue premarin cream three times weekly  3. OAB -will start Myrbetriq 25 mg daily  Return in about 3 months (around 09/20/2016).  Nickie Retort, MD  [facility] [address] [place], [place] [address] [phone]

## 2016-07-01 ENCOUNTER — Encounter (INDEPENDENT_AMBULATORY_CARE_PROVIDER_SITE_OTHER): Payer: Self-pay

## 2016-07-01 ENCOUNTER — Ambulatory Visit (INDEPENDENT_AMBULATORY_CARE_PROVIDER_SITE_OTHER): Payer: Medicare Other | Admitting: Vascular Surgery

## 2016-07-01 ENCOUNTER — Encounter (INDEPENDENT_AMBULATORY_CARE_PROVIDER_SITE_OTHER): Payer: Self-pay | Admitting: Vascular Surgery

## 2016-07-01 VITALS — BP 203/100 | HR 63 | Resp 16 | Ht 67.0 in | Wt 149.0 lb

## 2016-07-01 DIAGNOSIS — I6523 Occlusion and stenosis of bilateral carotid arteries: Secondary | ICD-10-CM

## 2016-07-01 DIAGNOSIS — I6529 Occlusion and stenosis of unspecified carotid artery: Secondary | ICD-10-CM | POA: Insufficient documentation

## 2016-07-01 DIAGNOSIS — E785 Hyperlipidemia, unspecified: Secondary | ICD-10-CM | POA: Diagnosis not present

## 2016-07-01 DIAGNOSIS — E119 Type 2 diabetes mellitus without complications: Secondary | ICD-10-CM | POA: Diagnosis not present

## 2016-07-01 DIAGNOSIS — I10 Essential (primary) hypertension: Secondary | ICD-10-CM | POA: Diagnosis not present

## 2016-07-01 NOTE — Progress Notes (Signed)
MRN : [id number]  Dawn Mcintyre is a 82 y.o. (1932/11/06) female who presents with chief complaint of  Chief Complaint  Patient presents with  . New Patient (Initial Visit)  .  History of Present Illness:The patient is seen for evaluation of carotid stenosis. The carotid stenosis was identified after the patient experienced syncope.  Duplex ultrasound done at [facility] on 06/20/2016 showed RICA [id number] and LICA 0000000 bilateral vertebral arteries patent with antegrade flow.  The patient denies amaurosis fugax. There is no recent history of TIA symptoms or focal motor deficits. There is no prior documented CVA.  There is no history of migraine headaches. There is no history of seizures.  The patient is taking enteric-coated aspirin 81 mg daily.  The patient has a history of coronary artery disease, no recent episodes of angina or shortness of breath. The patient denies PAD or claudication symptoms. There is a history of hyperlipidemia which is being treated with a statin.    Current Meds  Medication Sig  . acetaminophen (TYLENOL) 325 MG tablet Take 2 tablets (650 mg total) by mouth every 6 (six) hours as needed for mild pain (or Fever >/= 101).  Marland Kitchen acidophilus (RISAQUAD) CAPS capsule Take 2 capsules by mouth 3 (three) times daily.  Marland Kitchen ALPRAZolam (XANAX) 0.25 MG tablet Take 0.25 mg by mouth at bedtime as needed.    Marland Kitchen aspirin 81 MG tablet Take 81 mg by mouth daily.    Marland Kitchen atorvastatin (LIPITOR) 20 MG tablet Take by mouth daily.  Marland Kitchen conjugated estrogens (PREMARIN) vaginal cream Place 1 Applicatorful vaginally daily. Apply 0.5mg  (pea-sized amount)  just inside the vaginal introitus with a finger-tip every night for two weeks and then Monday, Wednesday and Friday nights.  . Cranberry 400 MG CAPS Take 1 tablet by mouth 2 (two) times daily.  . fexofenadine (ALLEGRA) 180 MG tablet Take 1 tablet by mouth as needed.  . gabapentin (NEURONTIN) 100 MG capsule Take 100 mg by mouth 3 (three) times daily.    Marland Kitchen  guaiFENesin-dextromethorphan (ROBITUSSIN DM) 100-10 MG/5ML syrup Take 5 mLs by mouth every 6 (six) hours as needed for cough.  . isosorbide mononitrate (IMDUR) 60 MG 24 hr tablet Take 60 mg by mouth daily.    Marland Kitchen levofloxacin (LEVAQUIN) 750 MG tablet Take 1 tablet (750 mg total) by mouth every other day.  . metoprolol tartrate (LOPRESSOR) 25 MG tablet Take 1 tablet (25 mg total) by mouth 2 (two) times daily.  . mirabegron ER (MYRBETRIQ) 25 MG TB24 tablet Take 1 tablet (25 mg total) by mouth daily.  . pantoprazole (PROTONIX) 40 MG tablet Take 1 tablet by mouth daily.  . predniSONE (DELTASONE) 2.5 MG tablet Take 2.5 mg by mouth daily.   Marland Kitchen PREMARIN vaginal cream USE 1 GRAM VAGINALLY EVERY OTHER NIGHT  . sulfamethoxazole-trimethoprim (BACTRIM DS,SEPTRA DS) 800-160 MG tablet Take 1 tablet by mouth 2 (two) times daily.  . valsartan-hydrochlorothiazide (DIOVAN-HCT) 160-12.5 MG per tablet Take 1 tablet by mouth daily.     Past Medical History:  Diagnosis Date  . Arthritis   . Atrophic vaginitis 11/16/2014  . Cataract   . Diabetes mellitus    border line. Pt currently is not on meds.   . Hyperlipidemia   . Hypertension   . Osteoporosis   . Pneumonia     x 16yrs ago  . Ulcer [facility])     Past Surgical History:  Procedure Laterality Date  . ABDOMINAL HYSTERECTOMY  1972  . EYE SURGERY    .  THROAT SURGERY     x 10 yrs ago. throat mass.    Social History Social History  Substance Use Topics  . Smoking status: Never Smoker  . Smokeless tobacco: Never Used  . Alcohol use No    Family History Family History  Problem Relation Age of Onset  . Anuerysm Mother   . Cancer Father     prostrate cancer  . Diabetes Brother   . Hearing loss Brother   . Bladder Cancer Neg Hx   . Kidney cancer Neg Hx   No family history of bleeding/clotting disorders, porphyria or autoimmune disease   Allergies  Allergen Reactions  . Amoxicillin-Pot Clavulanate Other (See Comments)  . Codeine Hives  .  Hydrocodone Nausea Only  . Metronidazole Nausea Only  . Propoxyphene Nausea Only  . Latex Rash     REVIEW OF SYSTEMS (Negative unless checked)  Constitutional: [] Weight loss  [] Fever  [] Chills Cardiac: [] Chest pain   [] Chest pressure   [] Palpitations   [] Shortness of breath when laying flat   [] Shortness of breath with exertion. Vascular:  [] Pain in legs with walking   [] Pain in legs at rest  [] History of DVT   [] Phlebitis   [] Swelling in legs   [] Varicose veins   [] Non-healing ulcers Pulmonary:   [] Uses home oxygen   [] Productive cough   [] Hemoptysis   [] Wheeze  [] COPD   [] Asthma Neurologic:  [] Dizziness   [] Seizures   [] History of stroke   [] History of TIA  [] Aphasia   [] Vissual changes   [] Weakness or numbness in arm   [] Weakness or numbness in leg Musculoskeletal:   [] Joint swelling   [] Joint pain   [] Low back pain Hematologic:  [] Easy bruising  [] Easy bleeding   [] Hypercoagulable state   [] Anemic Gastrointestinal:  [] Diarrhea   [] Vomiting  [] Gastroesophageal reflux/heartburn   [] Difficulty swallowing. Genitourinary:  [x] Chronic kidney disease, stage 2   [] Difficult urination  [] Frequent urination   [] Blood in urine Skin:  [] Rashes   [] Ulcers  Psychological:  [] History of anxiety   []  History of major depression.  Physical Examination  Vitals:   07/01/16 0839  BP: (!) 203/100  Pulse: 63  Resp: 16  Weight: 149 lb (67.6 kg)  Height: 5\' 7"  (1.702 m)   Body mass index is 23.34 kg/m. Gen: WD/WN, NAD Head: [place]/AT, No temporalis wasting.  Ear/Nose/Throat: Hearing grossly intact, nares w/o erythema or drainage, poor dentition Eyes: PER, EOMI, sclera nonicteric.  Neck: Supple, no masses.  No bruit or JVD.  Pulmonary:  Good air movement, clear to auscultation bilaterally, no use of accessory muscles.  Cardiac: RRR, normal S1, S2, no Murmurs. Vascular: left carotid bruit Vessel Right Left  Radial Palpable Palpable  Ulnar Palpable Palpable  Brachial Palpable Palpable  Carotid  Palpable Palpable  Femoral Palpable Palpable  Popliteal Palpable Palpable  PT Palpable Palpable  DP Palpable Palpable   Gastrointestinal: soft, non-distended. No guarding/no peritoneal signs.  Musculoskeletal: M/S 5/5 throughout.  No deformity or atrophy.  Neurologic: CN 2-12 intact. Pain and light touch intact in extremities.  Symmetrical.  Speech is fluent. Motor exam as listed above. Psychiatric: Judgment intact, Mood & affect appropriate for pt's clinical situation. Dermatologic: No rashes or ulcers noted.  No changes consistent with cellulitis. Lymph : No Cervical lymphadenopathy, no lichenification or skin changes of chronic lymphedema.  CBC Lab Results  Component Value Date   WBC 6.8 11/22/2015   HGB 12.5 11/22/2015   HCT 36.7 11/22/2015   MCV 90.4 11/22/2015   PLT  221 11/22/2015    BMET    Component Value Date/Time   NA 138 11/22/2015 2143   NA 139 09/12/2011 0250   K 3.4 (L) 11/22/2015 2143   K 3.5 09/12/2011 0250   CL 104 11/22/2015 2143   CL 102 09/12/2011 0250   CO2 27 11/22/2015 2143   CO2 23 09/12/2011 0250   GLUCOSE 151 (H) 11/22/2015 2143   GLUCOSE 162 (H) 09/12/2011 0250   BUN 20 05/30/2016 1358   BUN 23 (H) 09/12/2011 0250   CREATININE 1.10 (H) 05/30/2016 1358   CREATININE 0.94 09/12/2011 0250   CALCIUM 10.0 11/22/2015 2143   CALCIUM 9.1 09/12/2011 0250   GFRNONAA 47 (L) 05/30/2016 1358   GFRNONAA >60 09/12/2011 0250   GFRAA 54 (L) 05/30/2016 1358   GFRAA >60 09/12/2011 0250   CrCl cannot be calculated (Patient's most recent lab result is older than the maximum 21 days allowed.).  COAG No results found for: INR, PROTIME  Radiology Ct Hematuria Workup  Result Date: 06/17/2016 CLINICAL DATA:  Microscopic hematuria, frequent urination, dysuria, nocturia and incontinence. Suprapubic pain. EXAM: CT ABDOMEN AND PELVIS WITHOUT AND WITH CONTRAST TECHNIQUE: Multidetector CT imaging of the abdomen and pelvis was performed following the standard protocol  before and following the bolus administration of intravenous contrast. CONTRAST:  179mL ISOVUE-300 IOPAMIDOL (ISOVUE-300) INJECTION 61% COMPARISON:  08/02/2013. FINDINGS: Lower chest: Lung bases show no acute findings. Heart size normal. No pericardial or pleural effusion. Hepatobiliary: Liver and gallbladder are unremarkable. No biliary ductal dilatation. Pancreas: Negative, unchanged in appearance. Spleen: Negative. Adrenals/Urinary Tract: Adrenal glands and right kidney are unremarkable. Sub cm low-attenuation lesion in the left kidney is too small to characterize but statistically, a cyst is most likely. No urinary stones. No filling defects in the intrarenal collecting systems, ureters or bladder. Stomach/Bowel: Tiny hiatal hernia. Stomach, small bowel and colon are unremarkable. Appendix is not readily visualized. Vascular/Lymphatic: Atherosclerotic calcification of the arterial vasculature without abdominal aortic aneurysm. No pathologically enlarged lymph nodes. Reproductive: Hysterectomy.  No adnexal mass. Other: No free fluid.  Mesenteries and peritoneum are unremarkable. Musculoskeletal: No worrisome lytic or sclerotic lesions. Degenerative changes are seen in the spine. IMPRESSION: 1. No findings to explain the patient's hematuria. 2.  Aortic atherosclerosis (ICD10-170.0). Electronically Signed   By: Lorin Picket M.D.   On: 06/17/2016 10:08     Assessment/Plan 1. Bilateral carotid artery stenosis Recommend:  Given the patient's asymptomatic subcritical stenosis no further invasive testing or surgery at this time.  Duplex ultrasound shows moderate stenosis bilaterally.  Continue antiplatelet therapy as prescribed Continue management of CAD, HTN and Hyperlipidemia Healthy heart diet,  encouraged exercise at least 4 times per week Follow up in 6 months with duplex ultrasound and physical exam based on >50% stenosis of the left carotid artery   2. Essential hypertension Continue  antihypertensive medications as already ordered and reviewed, no changes at this time.  3. Diet-controlled type 2 diabetes mellitus ([place]) Continue hypoglycemic medications as already ordered and reviewed, no changes at this time.  4. Hyperlipidemia, unspecified hyperlipidemia type Continue statin as ordered and reviewed, no changes at this time  [name], MD  07/01/2016 9:07 AM

## 2016-07-11 DIAGNOSIS — M159 Polyosteoarthritis, unspecified: Secondary | ICD-10-CM | POA: Insufficient documentation

## 2016-07-11 DIAGNOSIS — I779 Disorder of arteries and arterioles, unspecified: Secondary | ICD-10-CM | POA: Diagnosis present

## 2016-07-11 DIAGNOSIS — E7800 Pure hypercholesterolemia, unspecified: Secondary | ICD-10-CM | POA: Insufficient documentation

## 2016-07-11 DIAGNOSIS — M150 Primary generalized (osteo)arthritis: Secondary | ICD-10-CM | POA: Insufficient documentation

## 2016-09-18 ENCOUNTER — Ambulatory Visit: Payer: Medicare Other | Admitting: Urology

## 2017-01-02 ENCOUNTER — Encounter (INDEPENDENT_AMBULATORY_CARE_PROVIDER_SITE_OTHER): Payer: Medicare Other

## 2017-01-02 ENCOUNTER — Ambulatory Visit (INDEPENDENT_AMBULATORY_CARE_PROVIDER_SITE_OTHER): Payer: Medicare Other | Admitting: Vascular Surgery

## 2017-02-12 ENCOUNTER — Other Ambulatory Visit: Payer: Self-pay | Admitting: Internal Medicine

## 2017-02-12 DIAGNOSIS — Z1231 Encounter for screening mammogram for malignant neoplasm of breast: Secondary | ICD-10-CM

## 2017-02-20 ENCOUNTER — Ambulatory Visit (INDEPENDENT_AMBULATORY_CARE_PROVIDER_SITE_OTHER): Payer: Medicare Other

## 2017-02-20 ENCOUNTER — Encounter (INDEPENDENT_AMBULATORY_CARE_PROVIDER_SITE_OTHER): Payer: Self-pay | Admitting: Vascular Surgery

## 2017-02-20 ENCOUNTER — Ambulatory Visit (INDEPENDENT_AMBULATORY_CARE_PROVIDER_SITE_OTHER): Payer: Medicare Other | Admitting: Vascular Surgery

## 2017-02-20 VITALS — BP 123/65 | HR 61 | Resp 16 | Wt 148.8 lb

## 2017-02-20 DIAGNOSIS — I6523 Occlusion and stenosis of bilateral carotid arteries: Secondary | ICD-10-CM | POA: Diagnosis not present

## 2017-02-20 DIAGNOSIS — E118 Type 2 diabetes mellitus with unspecified complications: Secondary | ICD-10-CM

## 2017-02-20 DIAGNOSIS — E785 Hyperlipidemia, unspecified: Secondary | ICD-10-CM

## 2017-02-20 DIAGNOSIS — I10 Essential (primary) hypertension: Secondary | ICD-10-CM | POA: Diagnosis not present

## 2017-02-21 IMAGING — CR DG CHEST 2V
2 series · 2 of 2 positions shown · non-contrast
Comparison: Two-view chest x-ray 05/06/2015.

CLINICAL DATA: Hypotension.  Nausea, diarrhea, and cough.

EXAM:
CHEST - 2 VIEW

[chest pa]
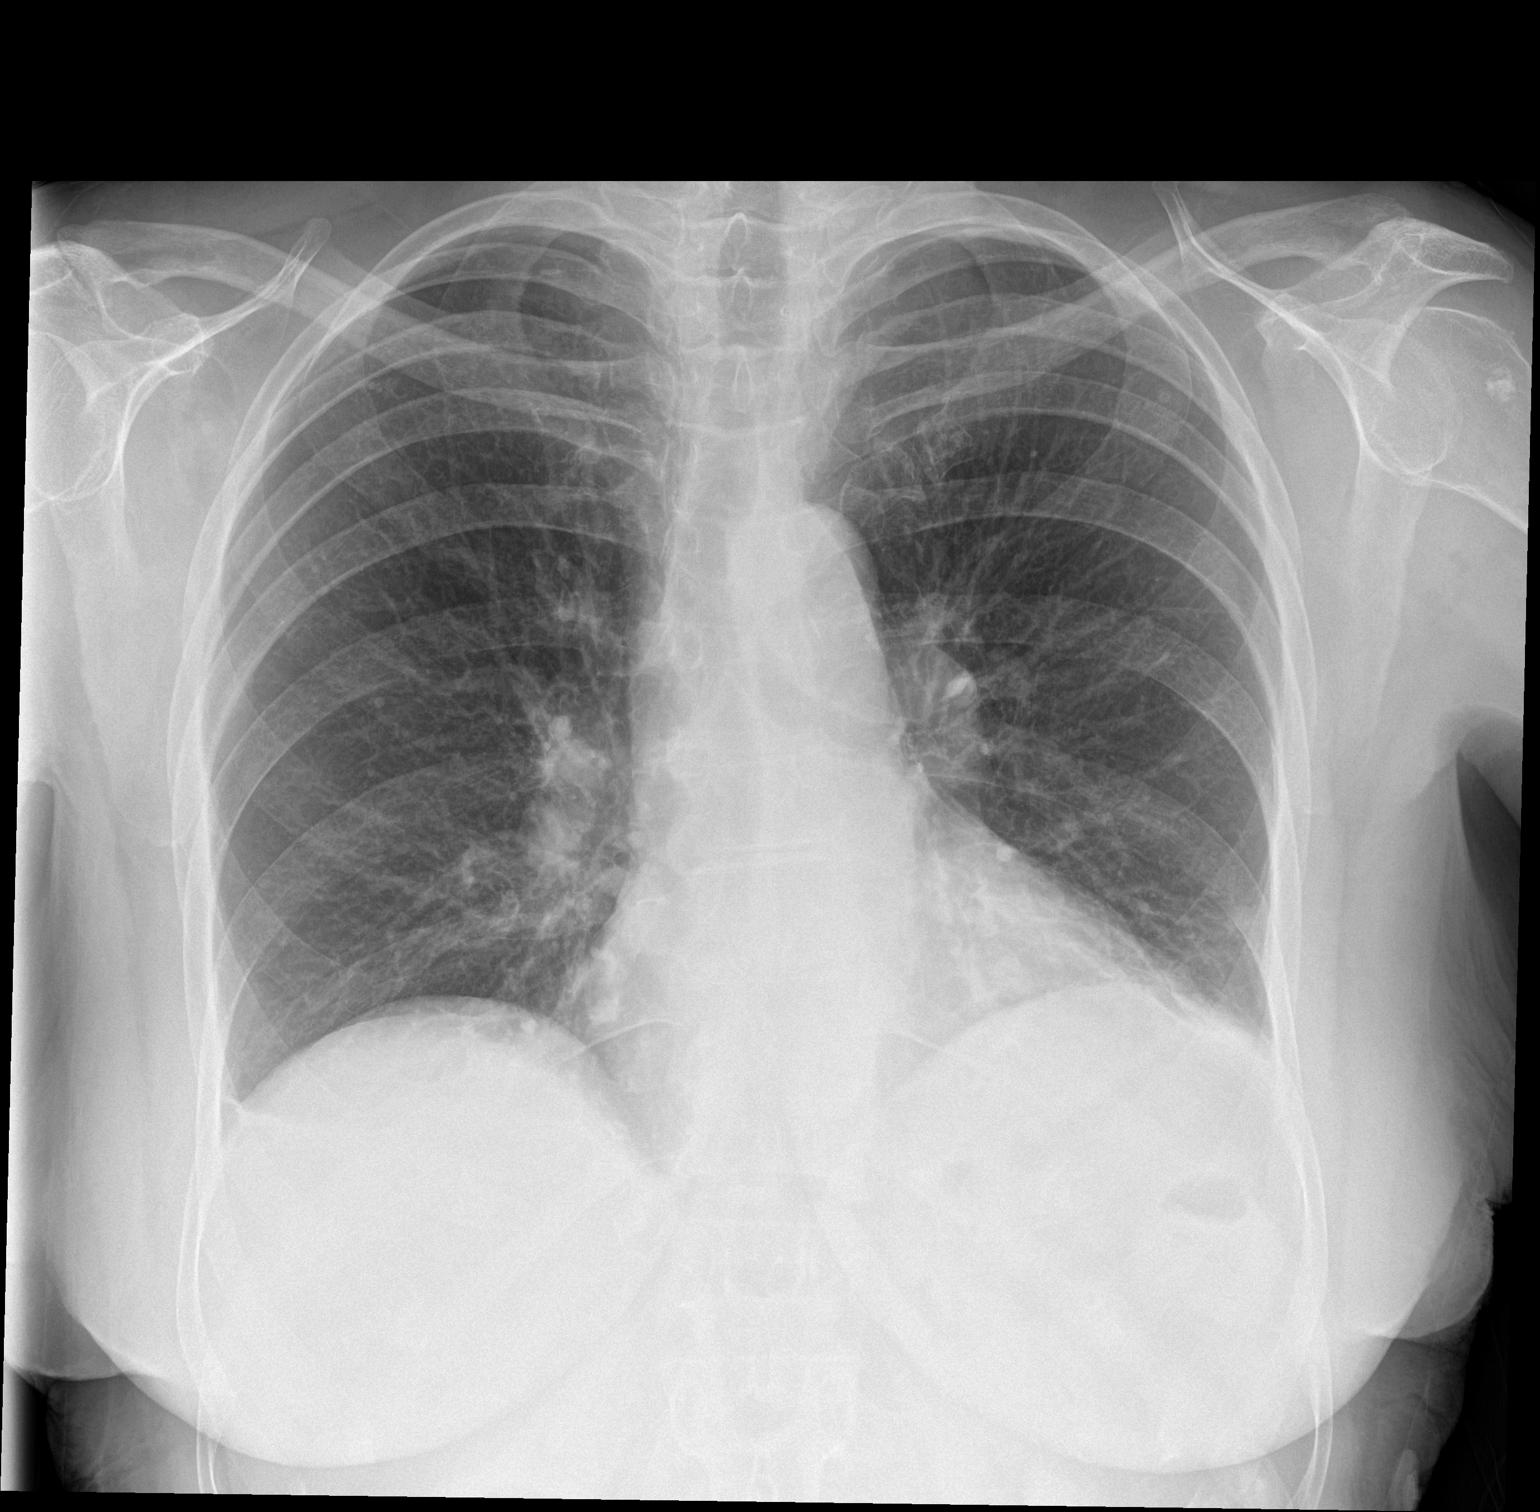

[chest lat]
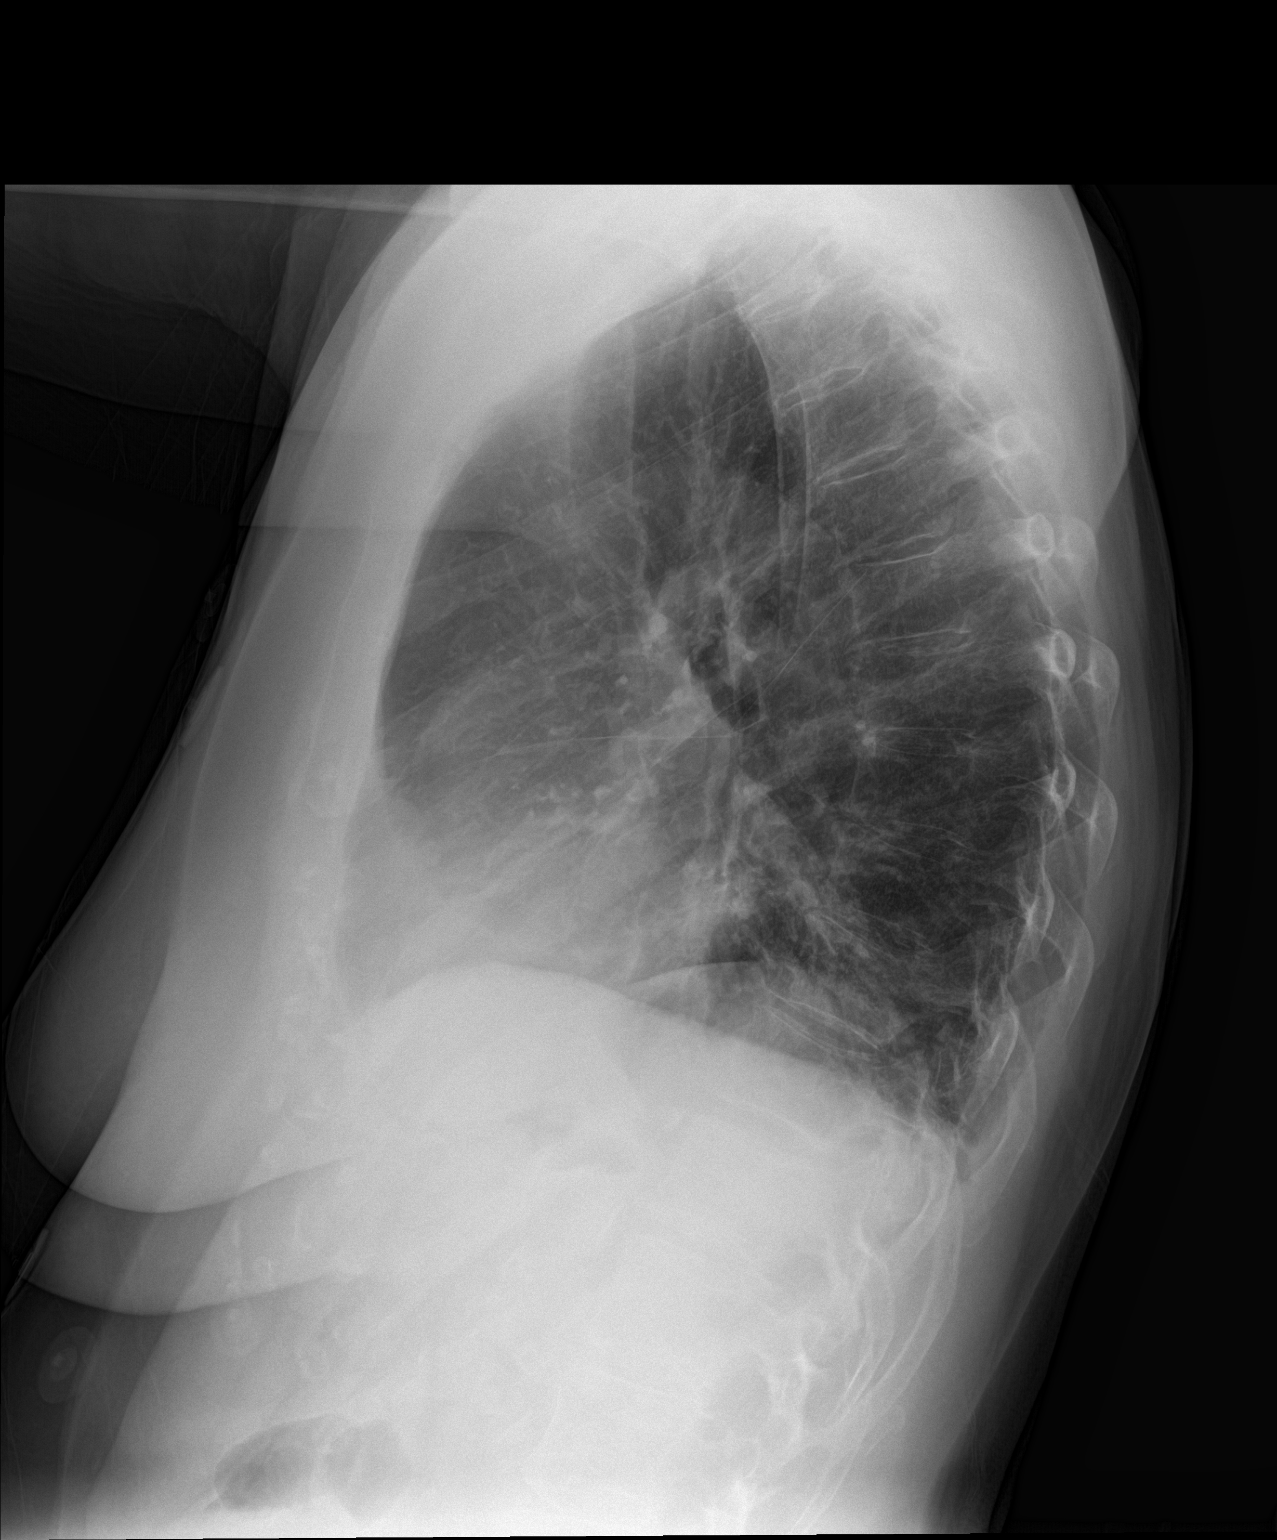

[2 of 2 positions shown; findings below may reference images not displayed]

FINDINGS: The heart is mildly enlarged. New bilateral pleural effusions are
present. Bibasilar airspace disease is evident. The upper lung
fields are clear. The visualized soft tissues and bony thorax are
unremarkable.
IMPRESSION: 1. New bilateral pleural effusions.
2. Increasing bibasilar airspace disease, left greater than right.
While this may represent atelectasis, it is concerning for
infection.

## 2017-02-23 NOTE — Progress Notes (Signed)
MRN : [id number]  Dawn Mcintyre is a 83 y.o. (1933/05/19) female who presents with chief complaint of  Chief Complaint  Patient presents with  . ultrasound follow up  .  History of Present Illness: The patient is seen for follow up evaluation of carotid stenosis. The carotid stenosis followed by ultrasound.   The patient denies amaurosis fugax. There is no recent history of TIA symptoms or focal motor deficits. There is no prior documented CVA.  The patient is taking enteric-coated aspirin 81 mg daily.  There is no history of migraine headaches. There is no history of seizures.  The patient has a history of coronary artery disease, no recent episodes of angina or shortness of breath. The patient denies PAD or claudication symptoms. There is a history of hyperlipidemia which is being treated with a statin.     Current Meds  Medication Sig  . acetaminophen (TYLENOL) 325 MG tablet Take 2 tablets (650 mg total) by mouth every 6 (six) hours as needed for mild pain (or Fever >/= 101).  Marland Kitchen acidophilus (RISAQUAD) CAPS capsule Take 2 capsules by mouth 3 (three) times daily.  Marland Kitchen ALPRAZolam (XANAX) 0.25 MG tablet Take 0.25 mg by mouth at bedtime as needed.    Marland Kitchen aspirin 81 MG tablet Take 81 mg by mouth daily.    Marland Kitchen atorvastatin (LIPITOR) 20 MG tablet Take by mouth daily.  Marland Kitchen conjugated estrogens (PREMARIN) vaginal cream Place 1 Applicatorful vaginally daily. Apply 0.5mg  (pea-sized amount)  just inside the vaginal introitus with a finger-tip every night for two weeks and then Monday, Wednesday and Friday nights.  . Cranberry 400 MG CAPS Take 1 tablet by mouth 2 (two) times daily.  . fexofenadine (ALLEGRA) 180 MG tablet Take 1 tablet by mouth as needed.  . gabapentin (NEURONTIN) 100 MG capsule Take 100 mg by mouth 3 (three) times daily.    . isosorbide mononitrate (IMDUR) 60 MG 24 hr tablet Take 60 mg by mouth daily.    Marland Kitchen levofloxacin (LEVAQUIN) 750 MG tablet Take 1 tablet (750 mg total) by  mouth every other day.  . metoprolol tartrate (LOPRESSOR) 25 MG tablet Take 1 tablet (25 mg total) by mouth 2 (two) times daily.  . mirabegron ER (MYRBETRIQ) 25 MG TB24 tablet Take 1 tablet (25 mg total) by mouth daily.  . pantoprazole (PROTONIX) 40 MG tablet Take 1 tablet by mouth daily.  . predniSONE (DELTASONE) 2.5 MG tablet Take 2.5 mg by mouth daily.   Marland Kitchen PREMARIN vaginal cream USE 1 GRAM VAGINALLY EVERY OTHER NIGHT  . valsartan-hydrochlorothiazide (DIOVAN-HCT) 160-12.5 MG per tablet Take 1 tablet by mouth daily.     Past Medical History:  Diagnosis Date  . Arthritis   . Atrophic vaginitis 11/16/2014  . Cataract   . Diabetes mellitus    border line. Pt currently is not on meds.   . Hyperlipidemia   . Hypertension   . Osteoporosis   . Pneumonia     x 71yrs ago  . Ulcer     Past Surgical History:  Procedure Laterality Date  . ABDOMINAL HYSTERECTOMY  1972  . EYE SURGERY    . THROAT SURGERY     x 10 yrs ago. throat mass.    Social History Social History  Substance Use Topics  . Smoking status: Never Smoker  . Smokeless tobacco: Never Used  . Alcohol use No    Family History Family History  Problem Relation Age of Onset  . Anuerysm Mother   .  Cancer Father        prostrate cancer  . Diabetes Brother   . Hearing loss Brother   . Bladder Cancer Neg Hx   . Kidney cancer Neg Hx     Allergies  Allergen Reactions  . Amoxicillin-Pot Clavulanate Other (See Comments)  . Codeine Hives  . Hydrocodone Nausea Only  . Metronidazole Nausea Only  . Propoxyphene Nausea Only  . Latex Rash     REVIEW OF SYSTEMS (Negative unless checked)  Constitutional: [] Weight loss  [] Fever  [] Chills Cardiac: [] Chest pain   [] Chest pressure   [] Palpitations   [] Shortness of breath when laying flat   [] Shortness of breath with exertion. Vascular:  [] Pain in legs with walking   [] Pain in legs at rest  [] History of DVT   [] Phlebitis   [] Swelling in legs   [] Varicose veins   [] Non-healing  ulcers Pulmonary:   [] Uses home oxygen   [] Productive cough   [] Hemoptysis   [] Wheeze  [] COPD   [] Asthma Neurologic:  [] Dizziness   [] Seizures   [] History of stroke   [] History of TIA  [] Aphasia   [] Vissual changes   [] Weakness or numbness in arm   [] Weakness or numbness in leg Musculoskeletal:   [] Joint swelling   [] Joint pain   [] Low back pain Hematologic:  [] Easy bruising  [] Easy bleeding   [] Hypercoagulable state   [] Anemic Gastrointestinal:  [] Diarrhea   [] Vomiting  [] Gastroesophageal reflux/heartburn   [] Difficulty swallowing. Genitourinary:  [] Chronic kidney disease   [] Difficult urination  [] Frequent urination   [] Blood in urine Skin:  [] Rashes   [] Ulcers  Psychological:  [] History of anxiety   []  History of major depression.  Physical Examination  Vitals:   02/20/17 1631  BP: 123/65  Pulse: 61  Resp: 16  Weight: 148 lb 12.8 oz (67.5 kg)   Body mass index is 23.31 kg/m. Gen: WD/WN, NAD Head: [place]/AT, No temporalis wasting.  Ear/Nose/Throat: Hearing grossly intact, nares w/o erythema or drainage Eyes: PER, EOMI, sclera nonicteric.  Neck: Supple, no large masses.   Pulmonary:  Good air movement, no audible wheezing bilaterally, no use of accessory muscles.  Cardiac: RRR, no JVD Vascular: right carotid bruit Vessel Right Left  Radial Palpable Palpable  Ulnar Palpable Palpable  Brachial Palpable Palpable  Carotid Palpable Palpable  Gastrointestinal: Non-distended. No guarding/no peritoneal signs.  Musculoskeletal: M/S 5/5 throughout.  No deformity or atrophy.  Neurologic: CN 2-12 intact. Symmetrical.  Speech is fluent. Motor exam as listed above. Psychiatric: Judgment intact, Mood & affect appropriate for pt's clinical situation. Dermatologic: No rashes or ulcers noted.  No changes consistent with cellulitis. Lymph : No lichenification or skin changes of chronic lymphedema.  CBC Lab Results  Component Value Date   WBC 6.8 11/22/2015   HGB 12.5 11/22/2015   HCT 36.7  11/22/2015   MCV 90.4 11/22/2015   PLT 221 11/22/2015    BMET    Component Value Date/Time   NA 138 11/22/2015 2143   NA 139 09/12/2011 0250   K 3.4 (L) 11/22/2015 2143   K 3.5 09/12/2011 0250   CL 104 11/22/2015 2143   CL 102 09/12/2011 0250   CO2 27 11/22/2015 2143   CO2 23 09/12/2011 0250   GLUCOSE 151 (H) 11/22/2015 2143   GLUCOSE 162 (H) 09/12/2011 0250   BUN 20 05/30/2016 1358   BUN 23 (H) 09/12/2011 0250   CREATININE 1.10 (H) 05/30/2016 1358   CREATININE 0.94 09/12/2011 0250   CALCIUM 10.0 11/22/2015 2143   CALCIUM 9.1  09/12/2011 0250   GFRNONAA 47 (L) 05/30/2016 1358   GFRNONAA >60 09/12/2011 0250   GFRAA 54 (L) 05/30/2016 1358   GFRAA >60 09/12/2011 0250   CrCl cannot be calculated (Patient's most recent lab result is older than the maximum 21 days allowed.).  COAG No results found for: INR, PROTIME  Radiology No results found.  Assessment/Plan 1. Bilateral carotid artery stenosis Recommend:  Given the patient's asymptomatic subcritical stenosis no further invasive testing or surgery at this time.  Duplex ultrasound shows 50% stenosis bilaterally.  Continue antiplatelet therapy as prescribed Continue management of CAD, HTN and Hyperlipidemia Healthy heart diet,  encouraged exercise at least 4 times per week Follow up in 24 months with duplex ultrasound and physical exam based on 50% stenosis of the carotid artery   - VAS US CAROTID; Future  2. Essential hypertension Continue antihypertensive medications as already ordered, these medications have been reviewed and there are no changes at this time.   3. Type 2 diabetes mellitus with complication, without long-term current use of insulin (HCC) Continue hypoglycemic medications as already ordered, these medications have been reviewed and there are no changes at this time.  Hgb A1C to be monitored as already arranged by primary service   4. Hyperlipidemia, unspecified hyperlipidemia type Continue  statin as ordered and reviewed, no changes at this time     [name], MD  02/23/2017 2:11 PM

## 2017-05-13 ENCOUNTER — Other Ambulatory Visit: Payer: Self-pay | Admitting: Internal Medicine

## 2017-05-13 DIAGNOSIS — R911 Solitary pulmonary nodule: Secondary | ICD-10-CM

## 2017-05-14 ENCOUNTER — Ambulatory Visit
Admission: RE | Admit: 2017-05-14 | Discharge: 2017-05-14 | Disposition: A | Discharge status: Home / Self Care | Payer: Medicare Other | Source: Ambulatory Visit | Attending: Internal Medicine | Admitting: Internal Medicine

## 2017-05-14 DIAGNOSIS — I2510 Atherosclerotic heart disease of native coronary artery without angina pectoris: Secondary | ICD-10-CM | POA: Insufficient documentation

## 2017-05-14 DIAGNOSIS — R911 Solitary pulmonary nodule: Secondary | ICD-10-CM | POA: Insufficient documentation

## 2017-05-14 DIAGNOSIS — I700 Atherosclerosis of aorta: Secondary | ICD-10-CM | POA: Insufficient documentation

## 2017-05-14 DIAGNOSIS — K449 Diaphragmatic hernia without obstruction or gangrene: Secondary | ICD-10-CM | POA: Diagnosis not present

## 2017-05-27 NOTE — Progress Notes (Signed)
05/29/2017 3:38 PM   Dawn Mcintyre 1932-11-14 [id number]  Referring provider: Glendon Axe, MD [place] [facility] [name], [place] [address]  Chief Complaint  Patient presents with  . Over Active Bladder    HPI: Patient is a 83 -year-old [place] American female with history of hematuria, vaginal atrophy and OAB who presents today with the complaint that she cannot hold her bladder.   History of hematuria  Patient completed a hematuria workup with CTU and cystoscopy in 2017 - no worrisome GU findings were discovered.  She has not had gross hematuria.  Her UA today is positive for 11-30 WBCs and moderate bacteria.    Vaginal atrophy Not using the cream.  OAB The patient is experiencing urgency x 4-7, frequency x 4-7, is restricting fluids to avoid visits to the restroom, is engaging in toilet mapping, incontinence x 4-7 and nocturia x 4-7.  Her PVR is 13 mL.  Her BP is 152/73.   These symptoms started 2 to 3 weeks ago.  Wears one pad daily.    PMH: Past Medical History:  Diagnosis Date  . Arthritis   . Atrophic vaginitis 11/16/2014  . Cataract   . Diabetes mellitus    border line. Pt currently is not on meds.   . Hyperlipidemia   . Hypertension   . Osteoporosis   . Pneumonia     x 45yrs ago  . Ulcer     Surgical History: Past Surgical History:  Procedure Laterality Date  . ABDOMINAL HYSTERECTOMY  1972  . EYE SURGERY    . THROAT SURGERY     x 10 yrs ago. throat mass.    Home Medications:  Allergies as of 05/29/2017      Reactions   Amoxicillin-pot Clavulanate Other (See Comments)   Codeine Hives   Hydrocodone Nausea Only   Metronidazole Nausea Only   Propoxyphene Nausea Only   Latex Rash      Medication List       Accurate as of 05/29/17  3:38 PM. Always use your most recent med list.          acetaminophen 325 MG tablet Commonly known as:  TYLENOL Take 2 tablets (650 mg total) by mouth every 6 (six) hours as needed for  mild pain (or Fever >/= 101).   acidophilus Caps capsule Take 2 capsules by mouth 3 (three) times daily.   ALPRAZolam 0.25 MG tablet Commonly known as:  XANAX Take 0.25 mg by mouth at bedtime as needed.   amLODipine 2.5 MG tablet Commonly known as:  NORVASC TAKE 1 TABLET (2.5 MG TOTAL) BY MOUTH ONCE DAILY. TAKE FOR BP OVER 150/90 AS NEEDED   aspirin 81 MG tablet Take 81 mg by mouth daily.   atorvastatin 20 MG tablet Commonly known as:  LIPITOR Take by mouth daily.   Cranberry 400 MG Caps Take 1 tablet by mouth 2 (two) times daily.   fexofenadine 180 MG tablet Commonly known as:  ALLEGRA Take 1 tablet by mouth as needed.   fluticasone 50 MCG/ACT nasal spray Commonly known as:  FLONASE Place 2 sprays into the nose daily.   gabapentin 100 MG capsule Commonly known as:  NEURONTIN Take 100 mg by mouth 3 (three) times daily.   guaiFENesin-dextromethorphan 100-10 MG/5ML syrup Commonly known as:  ROBITUSSIN DM Take 5 mLs by mouth every 6 (six) hours as needed for cough.   isosorbide mononitrate 60 MG 24 hr tablet Commonly known as:  IMDUR Take  60 mg by mouth daily.   metoprolol tartrate 25 MG tablet Commonly known as:  LOPRESSOR Take 1 tablet (25 mg total) by mouth 2 (two) times daily.   mirabegron ER 25 MG Tb24 tablet Commonly known as:  MYRBETRIQ Take 1 tablet (25 mg total) by mouth daily.   nitrofurantoin (macrocrystal-monohydrate) 100 MG capsule Commonly known as:  MACROBID Take 1 capsule (100 mg total) by mouth every 12 (twelve) hours.   pantoprazole 40 MG tablet Commonly known as:  PROTONIX Take 1 tablet by mouth daily.   predniSONE 2.5 MG tablet Commonly known as:  DELTASONE Take 2.5 mg by mouth daily.   PREMARIN vaginal cream Generic drug:  conjugated estrogens USE 1 GRAM VAGINALLY EVERY OTHER NIGHT   conjugated estrogens vaginal cream Commonly known as:  PREMARIN Place 1 Applicatorful vaginally daily. Apply 0.5mg  (pea-sized amount)  just inside  the vaginal introitus with a finger-tip every night for two weeks and then Monday, Wednesday and Friday nights.       Allergies:  Allergies  Allergen Reactions  . Amoxicillin-Pot Clavulanate Other (See Comments)  . Codeine Hives  . Hydrocodone Nausea Only  . Metronidazole Nausea Only  . Propoxyphene Nausea Only  . Latex Rash    Family History: Family History  Problem Relation Age of Onset  . Anuerysm Mother   . Cancer Father        prostrate cancer  . Diabetes Brother   . Hearing loss Brother   . Bladder Cancer Neg Hx   . Kidney cancer Neg Hx     Social History:  reports that she has never smoked. She has never used smokeless tobacco. She reports that she does not drink alcohol or use drugs.  ROS: UROLOGY Frequent Urination?: Yes Hard to postpone urination?: No Burning/pain with urination?: No Get up at night to urinate?: No Leakage of urine?: No Urine stream starts and stops?: No Trouble starting stream?: No Do you have to strain to urinate?: No Blood in urine?: No Urinary tract infection?: No Sexually transmitted disease?: No Injury to kidneys or bladder?: No Painful intercourse?: No Weak stream?: No Currently pregnant?: No Vaginal bleeding?: No Last menstrual period?: n  Gastrointestinal Nausea?: No Vomiting?: No Indigestion/heartburn?: No Diarrhea?: No Constipation?: No  Constitutional Fever: No Night sweats?: No Weight loss?: No Fatigue?: No  Skin Skin rash/lesions?: No Itching?: No  Eyes Blurred vision?: No Double vision?: No  Ears/Nose/Throat Sore throat?: No Sinus problems?: No  Hematologic/Lymphatic Swollen glands?: No Easy bruising?: No  Cardiovascular Leg swelling?: No Chest pain?: No  Respiratory Cough?: No Shortness of breath?: No  Endocrine Excessive thirst?: No  Musculoskeletal Back pain?: No Joint pain?: No  Neurological Headaches?: No Dizziness?: No  Psychologic Depression?: No Anxiety?:  No  Physical Exam: BP (!) 152/73   Pulse 61   Ht 5\' 7"  (1.702 m)   Wt 148 lb (67.1 kg)   BMI 23.18 kg/m   Constitutional: Well nourished. Alert and oriented, No acute distress. HEENT: [place] AT, moist mucus membranes. Trachea midline, no masses. Cardiovascular: No clubbing, cyanosis, or edema. Respiratory: Normal respiratory effort, no increased work of breathing. Lymph: No cervical or inguinal adenopathy. Neurologic: Grossly intact, no focal deficits, moving all 4 extremities. Psychiatric: Normal mood and affect.  Laboratory Data: Urinalysis 11-30 WBC's.  Moderate bacteria.  See EPIC.   Assessment & Plan:    1. History of hematuria   - Hematuria workup completed in 2017, no worrisome findings  - Urinalysis, Complete - no hematuria today  2. Vaginal atrophy  - Given samples of Premarin cream, instructed to apply it three night weekly  3. OAB  - restart Myrbetriq 25 mg daily  - RTC in 3 weeks for PVR and OAB questionnaire  4. UTI  - start Macrobid - will adjust when culture results are available  - urine culture is sent   Return in about 3 weeks (around 06/19/2017) for PVR and OAB questionnaire.  These notes generated with voice recognition software. I apologize for typographical errors.  Zara Council, [facility] [address] [place], [place] [address] [phone]

## 2017-05-29 ENCOUNTER — Ambulatory Visit (INDEPENDENT_AMBULATORY_CARE_PROVIDER_SITE_OTHER): Payer: Medicare Other | Admitting: Urology

## 2017-05-29 ENCOUNTER — Encounter: Payer: Self-pay | Admitting: Urology

## 2017-05-29 VITALS — BP 152/73 | HR 61 | Ht 67.0 in | Wt 148.0 lb

## 2017-05-29 DIAGNOSIS — Z87448 Personal history of other diseases of urinary system: Secondary | ICD-10-CM | POA: Diagnosis not present

## 2017-05-29 DIAGNOSIS — N952 Postmenopausal atrophic vaginitis: Secondary | ICD-10-CM

## 2017-05-29 DIAGNOSIS — N3000 Acute cystitis without hematuria: Secondary | ICD-10-CM | POA: Diagnosis not present

## 2017-05-29 DIAGNOSIS — N3281 Overactive bladder: Secondary | ICD-10-CM

## 2017-05-29 LAB — BLADDER SCAN AMB NON-IMAGING: Scan Result: 13

## 2017-05-29 MED ORDER — NITROFURANTOIN MONOHYD MACRO 100 MG PO CAPS: 100.0000 mg | ORAL_CAPSULE | Freq: Two times a day (BID) | ORAL | 0 refills | Status: DC

## 2017-05-29 MED ORDER — MIRABEGRON ER 25 MG PO TB24: 25.0000 mg | ORAL_TABLET | Freq: Every day | ORAL | 11 refills | Status: DC

## 2017-05-30 LAB — MICROSCOPIC EXAMINATION
Epithelial Cells (non renal): NONE SEEN /hpf (ref 0–10)
RBC MICROSCOPIC, UA: NONE SEEN /HPF (ref 0–?)

## 2017-05-30 LAB — URINALYSIS, COMPLETE
BILIRUBIN UA: NEGATIVE
GLUCOSE, UA: NEGATIVE
NITRITE UA: NEGATIVE
SPEC GRAV UA: 1.025 (ref 1.005–1.030)
UUROB: 0.2 mg/dL (ref 0.2–1.0)
pH, UA: 5.5 (ref 5.0–7.5)

## 2017-06-04 ENCOUNTER — Telehealth: Payer: Self-pay

## 2017-06-04 LAB — CULTURE, URINE COMPREHENSIVE

## 2017-06-04 NOTE — Telephone Encounter (Signed)
-----   Message from Nori Riis, PA-C sent at 06/04/2017 12:04 PM EDT ----- Please let Mrs. Downum known that her urine culture has grown out 2 bacteria. Escherichia coli and Klebsiella. The Escherichia coli is sensitive to the Macrobid, but the Klebsiella is intermediately susceptible to the [place]. I suggest she completes the Macrobid as prescribed and we will recheck her urine when she returns for her follow-up appointment in the next 2 weeks.

## 2017-06-04 NOTE — Telephone Encounter (Signed)
Spoke with pt in reference to ucx results, completing abx, and rechecking urine at f/u appt. Pt voiced understanding.

## 2017-06-18 NOTE — Progress Notes (Signed)
06/19/2017 4:08 PM   Dawn Mcintyre 11-11-1932 [id number]  Referring provider: Glendon Axe, MD [facility] [place], [place] [address]  No chief complaint on file.   HPI: Patient is a 83 -year-old [place] American female with history of hematuria, vaginal atrophy and OAB who presents today after a 3 week follow up for a trial of Myrbetriq.    History of hematuria  Patient completed a hematuria workup with CTU and cystoscopy in 2017 - no worrisome GU findings were discovered.  She has not had gross hematuria.    Vaginal atrophy Using the cream three nights weekly  OAB The patient is experiencing urgency x 0-3 (improved), frequency x 0-3 (improved), is restricting fluids to avoid visits to the restroom, is engaging in toilet mapping, incontinence x 0-3 (improved) and nocturia x 0-3 (improved).  Her PVR is 0 mL.  Her BP is 150/68.    Patient was found to have a positive UTI for E.coli and Klebsiella.  She was treated with the Macrobid, but she is still having some irritation.  She is not having fevers, chills, nausea or vomiting.  She denies dysuria or suprapubic pain.      PMH: Past Medical History:  Diagnosis Date  . Arthritis   . Atrophic vaginitis 11/16/2014  . Cataract   . Diabetes mellitus    border line. Pt currently is not on meds.   . Hyperlipidemia   . Hypertension   . Osteoporosis   . Pneumonia     x 89yrs ago  . Ulcer     Surgical History: Past Surgical History:  Procedure Laterality Date  . ABDOMINAL HYSTERECTOMY  1972  . EYE SURGERY    . THROAT SURGERY     x 10 yrs ago. throat mass.    Home Medications:  Allergies as of 06/19/2017      Reactions   Amoxicillin-pot Clavulanate Other (See Comments)   Codeine Hives   Hydrocodone Nausea Only   Metronidazole Nausea Only   Propoxyphene Nausea Only   Latex Rash      Medication List        Accurate as of 06/19/17  4:08 PM. Always use your most recent med list.            acetaminophen 325 MG tablet Commonly known as:  TYLENOL Take 2 tablets (650 mg total) by mouth every 6 (six) hours as needed for mild pain (or Fever >/= 101).   acidophilus Caps capsule Take 2 capsules by mouth 3 (three) times daily.   ALPRAZolam 0.25 MG tablet Commonly known as:  XANAX Take 0.25 mg by mouth at bedtime as needed.   amLODipine 2.5 MG tablet Commonly known as:  NORVASC TAKE 1 TABLET (2.5 MG TOTAL) BY MOUTH ONCE DAILY. TAKE FOR BP OVER 150/90 AS NEEDED   aspirin 81 MG tablet Take 81 mg by mouth daily.   atorvastatin 20 MG tablet Commonly known as:  LIPITOR Take by mouth daily.   ciprofloxacin 250 MG tablet Commonly known as:  CIPRO Take 1 tablet (250 mg total) 2 (two) times daily by mouth.   Cranberry 400 MG Caps Take 1 tablet by mouth 2 (two) times daily.   fexofenadine 180 MG tablet Commonly known as:  ALLEGRA Take 1 tablet by mouth as needed.   fluticasone 50 MCG/ACT nasal spray Commonly known as:  FLONASE Place 2 sprays into the nose daily.   gabapentin 100 MG capsule Commonly known as:  NEURONTIN Take  100 mg by mouth 3 (three) times daily.   guaiFENesin-dextromethorphan 100-10 MG/5ML syrup Commonly known as:  ROBITUSSIN DM Take 5 mLs by mouth every 6 (six) hours as needed for cough.   isosorbide mononitrate 60 MG 24 hr tablet Commonly known as:  IMDUR Take 60 mg by mouth daily.   mirabegron ER 25 MG Tb24 tablet Commonly known as:  MYRBETRIQ Take 1 tablet (25 mg total) daily by mouth.   pantoprazole 40 MG tablet Commonly known as:  PROTONIX Take 1 tablet by mouth daily.   predniSONE 2.5 MG tablet Commonly known as:  DELTASONE Take 2.5 mg by mouth daily.   PREMARIN vaginal cream Generic drug:  conjugated estrogens USE 1 GRAM VAGINALLY EVERY OTHER NIGHT   conjugated estrogens vaginal cream Commonly known as:  PREMARIN Place 1 Applicatorful vaginally daily. Apply 0.5mg  (pea-sized amount)  just inside the vaginal introitus with  a finger-tip every night for two weeks and then Monday, Wednesday and Friday nights.       Allergies:  Allergies  Allergen Reactions  . Amoxicillin-Pot Clavulanate Other (See Comments)  . Codeine Hives  . Hydrocodone Nausea Only  . Metronidazole Nausea Only  . Propoxyphene Nausea Only  . Latex Rash    Family History: Family History  Problem Relation Age of Onset  . Anuerysm Mother   . Cancer Father        prostrate cancer  . Diabetes Brother   . Hearing loss Brother   . Bladder Cancer Neg Hx   . Kidney cancer Neg Hx     Social History:  reports that  has never smoked. she has never used smokeless tobacco. She reports that she does not drink alcohol or use drugs.  ROS: UROLOGY Frequent Urination?: No Hard to postpone urination?: No Burning/pain with urination?: No Get up at night to urinate?: No Leakage of urine?: No Urine stream starts and stops?: No Trouble starting stream?: No Do you have to strain to urinate?: No Blood in urine?: No Urinary tract infection?: No Sexually transmitted disease?: No Injury to kidneys or bladder?: No Painful intercourse?: No Weak stream?: No Currently pregnant?: No Vaginal bleeding?: No Last menstrual period?: n  Gastrointestinal Nausea?: No Vomiting?: No Indigestion/heartburn?: No Diarrhea?: No Constipation?: No  Constitutional Fever: No Night sweats?: No Weight loss?: No Fatigue?: No  Skin Skin rash/lesions?: No Itching?: No  Eyes Blurred vision?: No Double vision?: No  Ears/Nose/Throat Sore throat?: No Sinus problems?: No  Hematologic/Lymphatic Swollen glands?: No Easy bruising?: No  Cardiovascular Leg swelling?: No Chest pain?: No  Respiratory Cough?: No Shortness of breath?: No  Endocrine Excessive thirst?: No  Musculoskeletal Back pain?: No Joint pain?: No  Neurological Headaches?: No Dizziness?: No  Psychologic Depression?: No Anxiety?: No  Physical Exam: BP (!) 150/68 (BP  Location: Right Arm, Patient Position: Sitting, Cuff Size: Normal)   Pulse (!) 55   Ht 5\' 7"  (1.702 m)   Wt 145 lb 11.2 oz (66.1 kg)   BMI 22.82 kg/m   Constitutional: Well nourished. Alert and oriented, No acute distress. HEENT: [place] AT, moist mucus membranes. Trachea midline, no masses. Cardiovascular: No clubbing, cyanosis, or edema. Respiratory: Normal respiratory effort, no increased work of breathing. Lymph: No cervical or inguinal adenopathy. Neurologic: Grossly intact, no focal deficits, moving all 4 extremities. Psychiatric: Normal mood and affect.  Laboratory Data:  Assessment & Plan:    1. History of hematuria   - Hematuria workup completed in 2017, no worrisome findings     2. Vaginal  atrophy  - continue vaginal estrogen cream; refills given  3. OAB  - Continue Myrbetriq 25 mg daily; refills given  - RTC in 3 months for PVR and OAB questionnaire  4. UTI  - start Cipro - Mcintyre contact the office if symptoms persist      Return in about 3 months (around 09/19/2017) for PVR and OAB questionnaire.  These notes generated with voice recognition software. I apologize for typographical errors.  Zara Council, [facility] [address], [place], [place] [address] [phone]

## 2017-06-19 ENCOUNTER — Ambulatory Visit: Payer: Medicare Other | Admitting: Urology

## 2017-06-19 VITALS — BP 150/68 | HR 55 | Ht 67.0 in | Wt 145.7 lb

## 2017-06-19 DIAGNOSIS — N952 Postmenopausal atrophic vaginitis: Secondary | ICD-10-CM | POA: Diagnosis not present

## 2017-06-19 DIAGNOSIS — Z87448 Personal history of other diseases of urinary system: Secondary | ICD-10-CM | POA: Diagnosis not present

## 2017-06-19 DIAGNOSIS — N3281 Overactive bladder: Secondary | ICD-10-CM

## 2017-06-19 DIAGNOSIS — N3000 Acute cystitis without hematuria: Secondary | ICD-10-CM | POA: Diagnosis not present

## 2017-06-19 LAB — BLADDER SCAN AMB NON-IMAGING: Scan Result: 0

## 2017-06-19 MED ORDER — MIRABEGRON ER 25 MG PO TB24: 25.0000 mg | ORAL_TABLET | Freq: Every day | ORAL | 11 refills | Status: DC

## 2017-06-19 MED ORDER — CIPROFLOXACIN HCL 250 MG PO TABS: 250.0000 mg | ORAL_TABLET | Freq: Two times a day (BID) | ORAL | 0 refills | Status: DC

## 2017-06-27 ENCOUNTER — Encounter: Payer: Self-pay | Admitting: Urology

## 2017-09-02 ENCOUNTER — Ambulatory Visit (INDEPENDENT_AMBULATORY_CARE_PROVIDER_SITE_OTHER): Payer: Medicare Other

## 2017-09-02 VITALS — BP 146/70 | HR 60 | Ht 67.0 in | Wt 148.0 lb

## 2017-09-02 DIAGNOSIS — N390 Urinary tract infection, site not specified: Secondary | ICD-10-CM | POA: Diagnosis not present

## 2017-09-02 NOTE — Progress Notes (Signed)
Pt presents today with c/o urinary frequency, dysuria, and lower abd pain. A clean catch was obtained for u/a and cx.   Blood pressure (!) 146/70, pulse 60, height 5\' 7"  (1.702 m), weight 148 lb (67.1 kg).

## 2017-09-03 LAB — MICROSCOPIC EXAMINATION
RBC, UA: NONE SEEN /hpf (ref 0–?)
WBC, UA: NONE SEEN /hpf (ref 0–?)

## 2017-09-03 LAB — URINALYSIS, COMPLETE
BILIRUBIN UA: NEGATIVE
GLUCOSE, UA: NEGATIVE
KETONES UA: NEGATIVE
LEUKOCYTES UA: NEGATIVE
Nitrite, UA: POSITIVE — AB
Protein, UA: NEGATIVE
RBC UA: NEGATIVE
SPEC GRAV UA: 1.025 (ref 1.005–1.030)
Urobilinogen, Ur: 0.2 mg/dL (ref 0.2–1.0)
pH, UA: 6.5 (ref 5.0–7.5)

## 2017-09-05 ENCOUNTER — Telehealth: Payer: Self-pay

## 2017-09-05 LAB — CULTURE, URINE COMPREHENSIVE

## 2017-09-05 MED ORDER — SULFAMETHOXAZOLE-TRIMETHOPRIM 800-160 MG PO TABS: 1.0000 | ORAL_TABLET | Freq: Two times a day (BID) | ORAL | 0 refills | Status: AC

## 2017-09-05 NOTE — Telephone Encounter (Signed)
LMOM- abx sent to pharmacy 

## 2017-09-05 NOTE — Telephone Encounter (Signed)
-----   Message from Hollice Espy, MD sent at 09/05/2017  8:52 AM EST ----- Although her UA was negative, she did have growing E. Coli in her urine.  Next time she is concerned for infection, I would strongly recommend a catheterized specimen if it was not done this time.  The treated with Bactrim DS twice daily times 7 days.  Given that she had several recurrent infections, it would be good to recheck her in about 2 weeks after she is completed antibiotics with a catheterized specimen to ensure that she is cleared the infection.  Hollice Espy, MD

## 2017-09-05 NOTE — Telephone Encounter (Signed)
Patient returned the call.  I advised her of the message from Dr. Erlene Quan.  She will pick up the Rx this afternoon.

## 2017-09-08 ENCOUNTER — Ambulatory Visit
Admission: RE | Admit: 2017-09-08 | Discharge: 2017-09-08 | Disposition: A | Discharge status: Home / Self Care | Payer: Medicare Other | Source: Ambulatory Visit | Attending: Internal Medicine | Admitting: Internal Medicine

## 2017-09-08 DIAGNOSIS — Z1231 Encounter for screening mammogram for malignant neoplasm of breast: Secondary | ICD-10-CM | POA: Diagnosis not present

## 2017-09-22 ENCOUNTER — Ambulatory Visit: Payer: Medicare Other | Admitting: Urology

## 2017-10-06 ENCOUNTER — Encounter: Payer: Self-pay | Admitting: Urology

## 2017-10-06 ENCOUNTER — Ambulatory Visit: Payer: Medicare Other | Admitting: Urology

## 2017-10-06 VITALS — BP 136/77 | HR 56 | Resp 14 | Ht 67.0 in | Wt 146.0 lb

## 2017-10-06 DIAGNOSIS — N3281 Overactive bladder: Secondary | ICD-10-CM

## 2017-10-06 DIAGNOSIS — Z87448 Personal history of other diseases of urinary system: Secondary | ICD-10-CM | POA: Diagnosis not present

## 2017-10-06 DIAGNOSIS — N952 Postmenopausal atrophic vaginitis: Secondary | ICD-10-CM | POA: Diagnosis not present

## 2017-10-06 DIAGNOSIS — N390 Urinary tract infection, site not specified: Secondary | ICD-10-CM | POA: Diagnosis not present

## 2017-10-06 NOTE — Progress Notes (Signed)
10/06/2017 8:57 AM   Dawn Mcintyre March 15, 1933 [id number]  Referring provider: Glendon Axe, MD [facility] [place], [place] [address]  No chief complaint on file.   HPI: Patient is a 84 -year-old [place] American female with history of hematuria, vaginal atrophy, OAB and an UTI who presents today for a three months follow up.     History of hematuria  Patient completed a hematuria workup with CTU and cystoscopy in 2017 - no worrisome GU findings were discovered.  She has not had gross hematuria.    Vaginal atrophy Using the cream three nights weekly  OAB The patient is experiencing urgency x 0-3 (stable), frequency x 4-7 (worse), is still restricting fluids to avoid visits to the restroom, is still engaging in toilet mapping, incontinence x 0-3 (stable) and nocturia x 0-3 (stable).  Her PVR is 0 mL.  Her BP is 136/77.    UTI Patient has had another UTI in January.  Her symptoms consisted of frequency, dysuria and lower abdominal pain.  Urine culture was positive for E. Coli.  She was placed on a seven day course of Bactrim DS.  She states her symptoms have mostly abated, but she still feels uncomfortable.  Patient denies any gross hematuria, dysuria or suprapubic/flank pain.  Patient denies any fevers, chills, nausea or vomiting.        PMH: Past Medical History:  Diagnosis Date  . Arthritis   . Atrophic vaginitis 11/16/2014  . Cataract   . Diabetes mellitus    border line. Pt currently is not on meds.   . Hyperlipidemia   . Hypertension   . Osteoporosis   . Pneumonia     x 67yrs ago  . Ulcer     Surgical History: Past Surgical History:  Procedure Laterality Date  . ABDOMINAL HYSTERECTOMY  1972  . BREAST CYST EXCISION Right 1972  . BREAST CYST EXCISION Left 1972  . EYE SURGERY    . THROAT SURGERY     x 10 yrs ago. throat mass.    Home Medications:  Allergies as of 10/06/2017      Reactions   Amoxicillin-pot Clavulanate Other  (See Comments)   Codeine Hives   Hydrocodone Nausea Only   Metronidazole Nausea Only   Propoxyphene Nausea Only   Latex Rash      Medication List        Accurate as of 10/06/17 11:59 PM. Always use your most recent med list.          ALPRAZolam 0.25 MG tablet Commonly known as:  XANAX Take 0.25 mg by mouth at bedtime as needed.   amLODipine 2.5 MG tablet Commonly known as:  NORVASC TAKE 1 TABLET (2.5 MG TOTAL) BY MOUTH ONCE DAILY. TAKE FOR BP OVER 150/90 AS NEEDED   aspirin 81 MG tablet Take 81 mg by mouth daily.   atorvastatin 20 MG tablet Commonly known as:  LIPITOR Take by mouth daily.   cloNIDine 0.1 MG tablet Commonly known as:  CATAPRES Take by mouth.   conjugated estrogens vaginal cream Commonly known as:  PREMARIN Place 1 Applicatorful vaginally daily. Apply 0.5mg  (pea-sized amount)  just inside the vaginal introitus with a finger-tip every night for two weeks and then Monday, Wednesday and Friday nights.   Cranberry 400 MG Caps Take 1 tablet by mouth 2 (two) times daily.   cyanocobalamin 1000 MCG/ML injection Commonly known as:  (VITAMIN B-12) Inject into the muscle.   fexofenadine 180 MG  tablet Commonly known as:  ALLEGRA Take 1 tablet by mouth as needed.   fluticasone 50 MCG/ACT nasal spray Commonly known as:  FLONASE Place 2 sprays into the nose daily.   gabapentin 100 MG capsule Commonly known as:  NEURONTIN Take 100 mg by mouth 3 (three) times daily.   isosorbide mononitrate 60 MG 24 hr tablet Commonly known as:  IMDUR Take 60 mg by mouth daily.   losartan-hydrochlorothiazide 100-12.5 MG tablet Commonly known as:  HYZAAR Take by mouth.   metoprolol tartrate 100 MG tablet Commonly known as:  LOPRESSOR Take by mouth.   mirabegron ER 25 MG Tb24 tablet Commonly known as:  MYRBETRIQ Take 1 tablet (25 mg total) daily by mouth.   pantoprazole 40 MG tablet Commonly known as:  PROTONIX Take 1 tablet by mouth daily.   predniSONE 2.5 MG  tablet Commonly known as:  DELTASONE Take 2.5 mg by mouth daily.       Allergies:  Allergies  Allergen Reactions  . Amoxicillin-Pot Clavulanate Other (See Comments)  . Codeine Hives  . Hydrocodone Nausea Only  . Metronidazole Nausea Only  . Propoxyphene Nausea Only  . Latex Rash    Family History: Family History  Problem Relation Age of Onset  . Anuerysm Mother   . Cancer Father        prostrate cancer  . Diabetes Brother   . Hearing loss Brother   . Bladder Cancer Neg Hx   . Kidney cancer Neg Hx     Social History:  reports that  has never smoked. she has never used smokeless tobacco. She reports that she does not drink alcohol or use drugs.  ROS: UROLOGY Frequent Urination?: Yes Hard to postpone urination?: No Burning/pain with urination?: No Get up at night to urinate?: Yes Leakage of urine?: No Urine stream starts and stops?: No Trouble starting stream?: No Do you have to strain to urinate?: No Blood in urine?: No Urinary tract infection?: No Sexually transmitted disease?: No Injury to kidneys or bladder?: No Painful intercourse?: No Weak stream?: No Currently pregnant?: No Vaginal bleeding?: No Last menstrual period?: n  Gastrointestinal Nausea?: No Vomiting?: No Indigestion/heartburn?: No Diarrhea?: No Constipation?: No  Constitutional Fever: No Night sweats?: No Weight loss?: No Fatigue?: No  Skin Skin rash/lesions?: No Itching?: No  Eyes Blurred vision?: No Double vision?: No  Ears/Nose/Throat Sore throat?: No Sinus problems?: No  Hematologic/Lymphatic Swollen glands?: No Easy bruising?: No  Cardiovascular Leg swelling?: No Chest pain?: No  Respiratory Cough?: No Shortness of breath?: No  Endocrine Excessive thirst?: No  Musculoskeletal Back pain?: No Joint pain?: No  Neurological Headaches?: No Dizziness?: No  Psychologic Depression?: No Anxiety?: No  Physical Exam: BP 136/77   Pulse (!) 56   Resp 14    Ht 5\' 7"  (1.702 m)   Wt 146 lb (66.2 kg)   SpO2 95%   BMI 22.87 kg/m   Constitutional: Well nourished. Alert and oriented, No acute distress. HEENT: [place] AT, moist mucus membranes. Trachea midline, no masses. Cardiovascular: No clubbing, cyanosis, or edema. Respiratory: Normal respiratory effort, no increased work of breathing. Skin: No rashes, bruises or suspicious lesions. Lymph: No cervical or inguinal adenopathy. Neurologic: Grossly intact, no focal deficits, moving all 4 extremities. Psychiatric: Normal mood and affect.   Laboratory Data:  Assessment & Plan:    1. History of hematuria   - Hematuria workup completed in 2017, no worrisome findings   2. Vaginal atrophy  - continue vaginal estrogen cream  -  RTC in 12 months for exam  3. OAB  - Continue Myrbetriq 25 mg daily  - RTC in 12 months for PVR and OAB questionnaire  4. UTI  - still has an uncomfortable feeling - will RTC for a CATH UA this week  - patient should be straight cathed for any UTI symptoms     Return for nurse schedule for CATH UA this week .  These notes generated with voice recognition software. I apologize for typographical errors.  Zara Council, [facility] [address] [place], [place] [address] [phone]

## 2017-10-07 ENCOUNTER — Other Ambulatory Visit: Payer: Self-pay | Admitting: Rheumatology

## 2017-10-07 DIAGNOSIS — M25571 Pain in right ankle and joints of right foot: Secondary | ICD-10-CM

## 2017-10-07 DIAGNOSIS — I809 Phlebitis and thrombophlebitis of unspecified site: Secondary | ICD-10-CM

## 2017-10-09 ENCOUNTER — Ambulatory Visit
Admission: RE | Admit: 2017-10-09 | Discharge: 2017-10-09 | Disposition: A | Discharge status: Home / Self Care | Payer: Medicare Other | Source: Ambulatory Visit | Attending: Rheumatology | Admitting: Rheumatology

## 2017-10-09 DIAGNOSIS — I809 Phlebitis and thrombophlebitis of unspecified site: Secondary | ICD-10-CM | POA: Insufficient documentation

## 2017-10-09 DIAGNOSIS — M25571 Pain in right ankle and joints of right foot: Secondary | ICD-10-CM | POA: Insufficient documentation

## 2017-10-13 ENCOUNTER — Ambulatory Visit: Payer: Medicare Other

## 2017-10-14 ENCOUNTER — Other Ambulatory Visit: Payer: Self-pay | Admitting: Internal Medicine

## 2017-10-17 ENCOUNTER — Other Ambulatory Visit: Payer: Self-pay | Admitting: Internal Medicine

## 2017-10-17 DIAGNOSIS — N3001 Acute cystitis with hematuria: Secondary | ICD-10-CM

## 2017-10-17 DIAGNOSIS — M79604 Pain in right leg: Secondary | ICD-10-CM

## 2017-10-17 DIAGNOSIS — M79605 Pain in left leg: Principal | ICD-10-CM

## 2017-10-22 ENCOUNTER — Other Ambulatory Visit: Payer: Self-pay | Admitting: Internal Medicine

## 2017-10-22 DIAGNOSIS — M79605 Pain in left leg: Principal | ICD-10-CM

## 2017-10-22 DIAGNOSIS — M79604 Pain in right leg: Secondary | ICD-10-CM

## 2017-10-23 ENCOUNTER — Ambulatory Visit (INDEPENDENT_AMBULATORY_CARE_PROVIDER_SITE_OTHER): Payer: Medicare Other | Admitting: Vascular Surgery

## 2017-10-23 ENCOUNTER — Encounter (INDEPENDENT_AMBULATORY_CARE_PROVIDER_SITE_OTHER): Payer: Self-pay | Admitting: Vascular Surgery

## 2017-10-23 ENCOUNTER — Ambulatory Visit
Admission: RE | Admit: 2017-10-23 | Discharge: 2017-10-23 | Disposition: A | Discharge status: Home / Self Care | Payer: Medicare Other | Source: Ambulatory Visit | Attending: Internal Medicine | Admitting: Internal Medicine

## 2017-10-23 ENCOUNTER — Other Ambulatory Visit: Payer: Self-pay | Admitting: Unknown Physician Specialty

## 2017-10-23 VITALS — BP 131/71 | HR 62 | Resp 15 | Ht 67.0 in | Wt 149.0 lb

## 2017-10-23 DIAGNOSIS — I10 Essential (primary) hypertension: Secondary | ICD-10-CM | POA: Diagnosis not present

## 2017-10-23 DIAGNOSIS — M79605 Pain in left leg: Secondary | ICD-10-CM | POA: Diagnosis present

## 2017-10-23 DIAGNOSIS — E785 Hyperlipidemia, unspecified: Secondary | ICD-10-CM

## 2017-10-23 DIAGNOSIS — M5432 Sciatica, left side: Secondary | ICD-10-CM

## 2017-10-23 DIAGNOSIS — I6529 Occlusion and stenosis of unspecified carotid artery: Secondary | ICD-10-CM | POA: Diagnosis not present

## 2017-10-23 DIAGNOSIS — M79604 Pain in right leg: Secondary | ICD-10-CM | POA: Diagnosis not present

## 2017-10-23 DIAGNOSIS — M79606 Pain in leg, unspecified: Secondary | ICD-10-CM | POA: Diagnosis not present

## 2017-10-23 DIAGNOSIS — R221 Localized swelling, mass and lump, neck: Secondary | ICD-10-CM

## 2017-10-23 DIAGNOSIS — I739 Peripheral vascular disease, unspecified: Secondary | ICD-10-CM | POA: Insufficient documentation

## 2017-10-23 DIAGNOSIS — E118 Type 2 diabetes mellitus with unspecified complications: Secondary | ICD-10-CM

## 2017-10-24 ENCOUNTER — Ambulatory Visit
Admission: RE | Admit: 2017-10-24 | Discharge: 2017-10-24 | Disposition: A | Discharge status: Home / Self Care | Payer: Medicare Other | Source: Ambulatory Visit | Attending: Unknown Physician Specialty | Admitting: Unknown Physician Specialty

## 2017-10-24 DIAGNOSIS — J387 Other diseases of larynx: Secondary | ICD-10-CM | POA: Diagnosis not present

## 2017-10-24 DIAGNOSIS — I6523 Occlusion and stenosis of bilateral carotid arteries: Secondary | ICD-10-CM | POA: Diagnosis not present

## 2017-10-24 DIAGNOSIS — R221 Localized swelling, mass and lump, neck: Secondary | ICD-10-CM | POA: Diagnosis present

## 2017-10-24 DIAGNOSIS — E041 Nontoxic single thyroid nodule: Secondary | ICD-10-CM | POA: Diagnosis not present

## 2017-10-24 MED ORDER — IOPAMIDOL (ISOVUE-300) INJECTION 61%
75.0000 mL | Freq: Once | INTRAVENOUS | Status: AC | PRN
  Administered 2017-10-24: 75 mL via INTRAVENOUS

## 2017-10-26 ENCOUNTER — Encounter (INDEPENDENT_AMBULATORY_CARE_PROVIDER_SITE_OTHER): Payer: Self-pay | Admitting: Vascular Surgery

## 2017-10-26 DIAGNOSIS — M79606 Pain in leg, unspecified: Secondary | ICD-10-CM | POA: Insufficient documentation

## 2017-10-26 NOTE — Progress Notes (Signed)
MRN : [id number]  Dawn Mcintyre is a 84 y.o. (08-Feb-1933) female who presents with chief complaint of  Chief Complaint  Patient presents with  . Follow-up    Right foot bothering her  .  History of Present Illness: The patient is seen for evaluation of painful lower extremities. Patient notes the pain is variable and not always associated with activity.  The pain is somewhat consistent day to day occurring on most days. The patient notes the pain also occurs with standing and routinely seems worse as the day wears on. The pain has been progressive over the past several years. The patient states these symptoms are causing  a profound negative impact on quality of life and daily activities.  The patient denies rest pain or dangling of an extremity off the side of the bed during the night for relief. No open wounds or sores at this time. No history of DVT or phlebitis. No prior interventions or surgeries.  There is a  history of back problems and DJD of the lumbar and sacral spine.    No outpatient medications have been marked as taking for the 10/23/17 encounter (Office Visit) with Delana Meyer, Dolores Lory, MD.    Past Medical History:  Diagnosis Date  . Arthritis   . Atrophic vaginitis 11/16/2014  . Cataract   . Diabetes mellitus    border line. Pt currently is not on meds.   . Hyperlipidemia   . Hypertension   . Osteoporosis   . Pneumonia     x 18yrs ago  . Ulcer     Past Surgical History:  Procedure Laterality Date  . ABDOMINAL HYSTERECTOMY  1972  . BREAST CYST EXCISION Right 1972  . BREAST CYST EXCISION Left 1972  . EYE SURGERY    . THROAT SURGERY     x 10 yrs ago. throat mass.    Social History Social History   Tobacco Use  . Smoking status: Never Smoker  . Smokeless tobacco: Never Used  Substance Use Topics  . Alcohol use: No    Alcohol/week: 0.0 oz  . Drug use: No    Family History Family History  Problem Relation Age of Onset  . Anuerysm Mother   .  Cancer Father        prostrate cancer  . Diabetes Brother   . Hearing loss Brother   . Bladder Cancer Neg Hx   . Kidney cancer Neg Hx     Allergies  Allergen Reactions  . Amoxicillin-Pot Clavulanate Other (See Comments)  . Codeine Hives  . Hydrocodone Nausea Only  . Metronidazole Nausea Only  . Propoxyphene Nausea Only  . Latex Rash     REVIEW OF SYSTEMS (Negative unless checked)  Constitutional: [] Weight loss  [] Fever  [] Chills Cardiac: [] Chest pain   [] Chest pressure   [] Palpitations   [] Shortness of breath when laying flat   [] Shortness of breath with exertion. Vascular:  [x] Pain in legs with walking   [x] Pain in legs at rest  [] History of DVT   [] Phlebitis   [] Swelling in legs   [] Varicose veins   [] Non-healing ulcers Pulmonary:   [] Uses home oxygen   [] Productive cough   [] Hemoptysis   [] Wheeze  [] COPD   [] Asthma Neurologic:  [] Dizziness   [] Seizures   [] History of stroke   [] History of TIA  [] Aphasia   [] Vissual changes   [] Weakness or numbness in arm   [] Weakness or numbness in leg Musculoskeletal:   [] Joint swelling   [] Joint  pain   [] Low back pain Hematologic:  [] Easy bruising  [] Easy bleeding   [] Hypercoagulable state   [] Anemic Gastrointestinal:  [] Diarrhea   [] Vomiting  [] Gastroesophageal reflux/heartburn   [] Difficulty swallowing. Genitourinary:  [] Chronic kidney disease   [] Difficult urination  [] Frequent urination   [] Blood in urine Skin:  [] Rashes   [] Ulcers  Psychological:  [] History of anxiety   []  History of major depression.  Physical Examination  Vitals:   10/23/17 1409  BP: 131/71  Pulse: 62  Resp: 15  Weight: 149 lb (67.6 kg)  Height: 5\' 7"  (1.702 m)   Body mass index is 23.34 kg/m. Gen: WD/WN, NAD Head: [place]/AT, No temporalis wasting.  Ear/Nose/Throat: Hearing grossly intact, nares w/o erythema or drainage Eyes: PER, EOMI, sclera nonicteric.  Neck: Supple, no large masses.   Pulmonary:  Good air movement, no audible wheezing bilaterally, no use  of accessory muscles.  Cardiac: RRR, no JVD Vascular:  Both feet pink and warm, 1+ edema Vessel Right Left  Radial Palpable Palpable  PT Palpable Palpable  DP Palpable Palpable  Gastrointestinal: Non-distended. No guarding/no peritoneal signs.  Musculoskeletal: M/S 5/5 throughout.  No deformity or atrophy.  Neurologic: CN 2-12 intact. Symmetrical.  Speech is fluent. Motor exam as listed above. Psychiatric: Judgment intact, Mood & affect appropriate for pt's clinical situation. Dermatologic: No rashes or ulcers noted.  No changes consistent with cellulitis. Lymph : No lichenification or skin changes of chronic lymphedema.  CBC Lab Results  Component Value Date   WBC 6.8 11/22/2015   HGB 12.5 11/22/2015   HCT 36.7 11/22/2015   MCV 90.4 11/22/2015   PLT 221 11/22/2015    BMET    Component Value Date/Time   NA 138 11/22/2015 2143   NA 139 09/12/2011 0250   K 3.4 (L) 11/22/2015 2143   K 3.5 09/12/2011 0250   CL 104 11/22/2015 2143   CL 102 09/12/2011 0250   CO2 27 11/22/2015 2143   CO2 23 09/12/2011 0250   GLUCOSE 151 (H) 11/22/2015 2143   GLUCOSE 162 (H) 09/12/2011 0250   BUN 20 05/30/2016 1358   BUN 23 (H) 09/12/2011 0250   CREATININE 1.10 (H) 05/30/2016 1358   CREATININE 0.94 09/12/2011 0250   CALCIUM 10.0 11/22/2015 2143   CALCIUM 9.1 09/12/2011 0250   GFRNONAA 47 (L) 05/30/2016 1358   GFRNONAA >60 09/12/2011 0250   GFRAA 54 (L) 05/30/2016 1358   GFRAA >60 09/12/2011 0250   CrCl cannot be calculated (Patient's most recent lab result is older than the maximum 21 days allowed.).  COAG No results found for: INR, PROTIME  Radiology Ct Soft Tissue Neck W Contrast  Result Date: 10/24/2017 CLINICAL DATA:  Right neck mass EXAM: CT NECK WITH CONTRAST TECHNIQUE: Multidetector CT imaging of the neck was performed using the standard protocol following the bolus administration of intravenous contrast. CONTRAST:  32mL ISOVUE-300 IOPAMIDOL (ISOVUE-300) INJECTION 61%  COMPARISON:  Thyroid ultrasound 02/12/2013 FINDINGS: Pharynx and larynx: Tongue and oropharynx normal. 9 x 17 mm cyst in the vallecula on the left. This may be arising from base of tongue. This has homogeneous fluid intensity and appears benign. Direct visualization recommended. Airway intact. Larynx normal Salivary glands: No inflammation, mass, or stone. Thyroid: Mild thyroid enlargement bilaterally. 3 mm left upper pole nodule Lymph nodes: No enlarged lymph nodes. Vascular: Atherosclerotic disease of the carotid bifurcation bilaterally. There appears to be significant carotid stenosis bilaterally. Limited intracranial: Negative Visualized orbits: Right cataract removal. Left orbit not fully visualized. Mastoids and visualized paranasal  sinuses: Negative Skeleton: Cervical spondylosis.  No acute bony abnormality. Upper chest: Lung apices clear bilaterally. Other: None IMPRESSION: Negative for  adenopathy in the neck 3 mm left thyroid nodule 9 x 17 mm cyst in the vallecula on the left. By imaging, this appears benign. Direct visualization recommended. Carotid artery disease with significant carotid stenosis bilaterally. Electronically Signed   By: Franchot Gallo M.D.   On: 10/24/2017 12:40   US Venous Img Lower Unilateral Right  Result Date: 10/09/2017 CLINICAL DATA:  84 year old female with acute right ankle pain EXAM: RIGHT LOWER EXTREMITY VENOUS DOPPLER ULTRASOUND TECHNIQUE: Gray-scale sonography with graded compression, as well as color Doppler and duplex ultrasound were performed to evaluate the lower extremity deep venous systems from the level of the common femoral vein and including the common femoral, femoral, profunda femoral, popliteal and calf veins including the posterior tibial, peroneal and gastrocnemius veins when visible. The superficial great saphenous vein was also interrogated. Spectral Doppler was utilized to evaluate flow at rest and with distal augmentation maneuvers in the common femoral,  femoral and popliteal veins. COMPARISON:  None. FINDINGS: Contralateral Common Femoral Vein: Respiratory phasicity is normal and symmetric with the symptomatic side. No evidence of thrombus. Normal compressibility. Common Femoral Vein: No evidence of thrombus. Normal compressibility, respiratory phasicity and response to augmentation. Saphenofemoral Junction: No evidence of thrombus. Normal compressibility and flow on color Doppler imaging. Profunda Femoral Vein: No evidence of thrombus. Normal compressibility and flow on color Doppler imaging. Femoral Vein: No evidence of thrombus. Normal compressibility, respiratory phasicity and response to augmentation. Popliteal Vein: No evidence of thrombus. Normal compressibility, respiratory phasicity and response to augmentation. Calf Veins: No evidence of thrombus. Normal compressibility and flow on color Doppler imaging. Superficial Great Saphenous Vein: No evidence of thrombus. Normal compressibility. Venous Reflux:  None. Other Findings:  None. IMPRESSION: No evidence of deep venous thrombosis. Electronically Signed   By: Jacqulynn Cadet M.D.   On: 10/09/2017 17:08   US Arterial Lower Extremity Duplex Bilateral  Result Date: 10/23/2017 CLINICAL DATA:  84 year old female with bilateral lower extremity pain EXAM: BILATERAL LOWER EXTREMITY ARTERIAL DUPLEX SCAN TECHNIQUE: Gray-scale sonography as well as color Doppler and duplex ultrasound was performed to evaluate the arteries of both lower extremities including the common, superficial and profunda femoral arteries, popliteal artery and calf arteries. COMPARISON:  None. FINDINGS: Right Lower Extremity Inflow: Normal common femoral arterial waveforms and velocities. No evidence of inflow (aortoiliac) disease. Outflow: Normal profunda femoral, superficial femoral and popliteal arterial waveforms and velocities. No focal elevation of the PSV to suggest stenosis. Runoff: Normal posterior and anterior tibial arterial  waveforms and velocities. Vessels are patent to the ankle. Left Lower Extremity Inflow: Normal common femoral arterial waveforms and velocities. No evidence of inflow (aortoiliac) disease. Outflow: Normal profunda femoral, superficial femoral and popliteal arterial waveforms and velocities. No focal elevation of the PSV to suggest stenosis. Runoff: Normal posterior and anterior tibial arterial waveforms and velocities. Vessels are patent to the ankle. IMPRESSION: 1. Scattered atherosclerotic plaques noted throughout the bilateral lower extremity arterial trees consistent with at least mild peripheral arterial disease. 2. However, there is no evidence of hemodynamically significant focal stenosis or occlusion. Signed, Criselda Peaches, MD [facility] Electronically Signed   By: Jacqulynn Cadet M.D.   On: 10/23/2017 13:40     Assessment/Plan 1. Pain of lower extremity, unspecified laterality Recommend:  I do not find evidence of Vascular pathology that would explain the patient's symptoms  The patient has atypical pain symptoms for vascular disease  Noninvasive studies including venous ultrasound of the legs do not identify vascular problems  The patient should continue walking and begin a more formal exercise program. The patient should continue his antiplatelet therapy and aggressive treatment of the lipid abnormalities. The patient should begin wearing graduated compression socks 15-20 mmHg strength to control her mild edema.  Patient will follow-up with me on a PRN basis for this issue  Further work-up of her lower extremity pain is deferred to the primary service     2. Sciatica of left side I believe that this is the cause of her leg pain.  3. Stenosis of carotid artery, unspecified laterality She already has an appointment for the problem scheduled  4. Essential hypertension Continue antihypertensive medications as  already ordered, these medications have been reviewed and there are no changes at this time.   5. Type 2 diabetes mellitus with complication, without long-term current use of insulin (HCC) Continue hypoglycemic medications as already ordered, these medications have been reviewed and there are no changes at this time.  Hgb A1C to be monitored as already arranged by primary service   6. Hyperlipidemia, unspecified hyperlipidemia type Continue statin as ordered and reviewed, no changes at this time     [name], MD  10/26/2017 10:46 AM

## 2017-11-06 ENCOUNTER — Telehealth (INDEPENDENT_AMBULATORY_CARE_PROVIDER_SITE_OTHER): Payer: Self-pay | Admitting: Vascular Surgery

## 2017-11-10 ENCOUNTER — Ambulatory Visit (INDEPENDENT_AMBULATORY_CARE_PROVIDER_SITE_OTHER): Payer: Medicare Other | Admitting: Vascular Surgery

## 2017-11-10 ENCOUNTER — Encounter (INDEPENDENT_AMBULATORY_CARE_PROVIDER_SITE_OTHER): Payer: Self-pay | Admitting: Vascular Surgery

## 2017-11-10 ENCOUNTER — Telehealth (INDEPENDENT_AMBULATORY_CARE_PROVIDER_SITE_OTHER): Payer: Self-pay

## 2017-11-10 VITALS — BP 140/68 | HR 66 | Resp 16 | Ht 67.0 in | Wt 147.4 lb

## 2017-11-10 DIAGNOSIS — R55 Syncope and collapse: Secondary | ICD-10-CM | POA: Diagnosis not present

## 2017-11-10 DIAGNOSIS — I10 Essential (primary) hypertension: Secondary | ICD-10-CM | POA: Diagnosis not present

## 2017-11-10 DIAGNOSIS — I6529 Occlusion and stenosis of unspecified carotid artery: Secondary | ICD-10-CM

## 2017-11-10 DIAGNOSIS — E782 Mixed hyperlipidemia: Secondary | ICD-10-CM

## 2017-11-10 DIAGNOSIS — I25118 Atherosclerotic heart disease of native coronary artery with other forms of angina pectoris: Secondary | ICD-10-CM | POA: Diagnosis not present

## 2017-11-10 DIAGNOSIS — E118 Type 2 diabetes mellitus with unspecified complications: Secondary | ICD-10-CM

## 2017-11-10 NOTE — Telephone Encounter (Signed)
Per Dawn Mcintyre, A CT scan of her neck was ordered by her ENT Dr. Tami Ribas.  She needs to touch base with him about the CAT scan and the results.  Dawn Mcintyre states she spoke with her last week explaining this to her.

## 2017-11-10 NOTE — Telephone Encounter (Signed)
Called the patient back to let her know to contact her ENT for the CAT scan that they ordered and to get her results.

## 2017-11-10 NOTE — Telephone Encounter (Signed)
I spoke with the patient last week and explained that she should speak with Dr. Tami Ribas regarding her CT results and the knot on her neck, she understood and stated she would call him.

## 2017-11-15 ENCOUNTER — Encounter (INDEPENDENT_AMBULATORY_CARE_PROVIDER_SITE_OTHER): Payer: Self-pay | Admitting: Vascular Surgery

## 2017-11-15 DIAGNOSIS — I2510 Atherosclerotic heart disease of native coronary artery without angina pectoris: Secondary | ICD-10-CM | POA: Insufficient documentation

## 2017-11-15 DIAGNOSIS — R55 Syncope and collapse: Secondary | ICD-10-CM | POA: Insufficient documentation

## 2017-11-15 NOTE — Addendum Note (Signed)
Addended by: Katha Cabal on: 11/15/2017 01:29 PM   Modules accepted: Orders

## 2017-11-15 NOTE — Progress Notes (Addendum)
MRN : [id number]  Dawn Mcintyre is a 84 y.o. (06-Jul-1933) female who presents with chief complaint of  Chief Complaint  Patient presents with  . Follow-up    ref Tami Ribas possible TIA  .  History of Present Illness: The patient is seen for evaluation of a syncopal episode. The patient describes it as a light headedness that progressed to blacking out.  She is denies the "room spinning".  It lasted on the order of minutes and resolved completely.  There was some loss of consciousness.  There have not been further episodes over the past several months.  There is no recent history of TIA symptoms or focal motor deficits. There is no prior documented CVA.  The patient was not taking enteric-coated aspirin 81 mg daily at the time.  There is no history of migraine headaches or prior diagnosis of ocular migraine. There is no history of seizures.  Given this syncopal episode she was seen by Dr. Tami Ribas.  As part of his very thorough workup CT scan was obtained which did includes intravenous contrast.  By my read the patient has greater than 80% close to 90% stenosis of the internal carotid arteries bilaterally.  There is a very large amount of dense calcification at the bifurcation which would explain the discrepancy between her last ultrasound.  The patient has a history of coronary artery disease, no recent episodes of angina or shortness of breath. The patient denies PAD or claudication symptoms. There is a history of hyperlipidemia which is being treated with a statin.    Current Meds  Medication Sig  . ALPRAZolam (XANAX) 0.25 MG tablet Take 0.25 mg by mouth at bedtime as needed.    Marland Kitchen amLODipine (NORVASC) 2.5 MG tablet TAKE 1 TABLET (2.5 MG TOTAL) BY MOUTH ONCE DAILY. TAKE FOR BP OVER 150/90 AS NEEDED  . aspirin 81 MG tablet Take 81 mg by mouth daily.    . cloNIDine (CATAPRES) 0.1 MG tablet Take by mouth.  . conjugated estrogens (PREMARIN) vaginal cream Place 1 Applicatorful vaginally  daily. Apply 0.5mg  (pea-sized amount)  just inside the vaginal introitus with a finger-tip every night for two weeks and then Monday, Wednesday and Friday nights.  . Cranberry 400 MG CAPS Take 1 tablet by mouth 2 (two) times daily.  . cyanocobalamin (,VITAMIN B-12,) 1000 MCG/ML injection Inject into the muscle.  . fexofenadine (ALLEGRA) 180 MG tablet Take 1 tablet by mouth as needed.  . gabapentin (NEURONTIN) 100 MG capsule Take 100 mg by mouth 3 (three) times daily.    . isosorbide mononitrate (IMDUR) 60 MG 24 hr tablet Take 60 mg by mouth daily.    Marland Kitchen losartan-hydrochlorothiazide (HYZAAR) 100-12.5 MG tablet Take by mouth.  . metoprolol tartrate (LOPRESSOR) 100 MG tablet Take by mouth.  . mirabegron ER (MYRBETRIQ) 25 MG TB24 tablet Take 1 tablet (25 mg total) daily by mouth.  . pantoprazole (PROTONIX) 40 MG tablet Take 1 tablet by mouth daily.  . predniSONE (DELTASONE) 2.5 MG tablet Take 2.5 mg by mouth daily.     Past Medical History:  Diagnosis Date  . Arthritis   . Atrophic vaginitis 11/16/2014  . Cataract   . Diabetes mellitus    border line. Pt currently is not on meds.   . Hyperlipidemia   . Hypertension   . Osteoporosis   . Pneumonia     x 15yrs ago  . Ulcer     Past Surgical History:  Procedure Laterality Date  . ABDOMINAL HYSTERECTOMY  [place] Right 1972  . BREAST CYST EXCISION Left 1972  . EYE SURGERY    . THROAT SURGERY     x 10 yrs ago. throat mass.    Social History Social History   Tobacco Use  . Smoking status: Never Smoker  . Smokeless tobacco: Never Used  Substance Use Topics  . Alcohol use: No    Alcohol/week: 0.0 oz  . Drug use: No    Family History Family History  Problem Relation Age of Onset  . Anuerysm Mother   . Cancer Father        prostrate cancer  . Diabetes Brother   . Hearing loss Brother   . Bladder Cancer Neg Hx   . Kidney cancer Neg Hx     Allergies  Allergen Reactions  . Amoxicillin-Pot Clavulanate  Other (See Comments)  . Codeine Hives  . Hydrocodone Nausea Only  . Metronidazole Nausea Only  . Propoxyphene Nausea Only  . Latex Rash     REVIEW OF SYSTEMS (Negative unless checked)  Constitutional: [] Weight loss  [] Fever  [] Chills Cardiac: [] Chest pain   [] Chest pressure   [] Palpitations   [] Shortness of breath when laying flat   [] Shortness of breath with exertion. Vascular:  [] Pain in legs with walking   [] Pain in legs at rest  [] History of DVT   [] Phlebitis   [] Swelling in legs   [] Varicose veins   [] Non-healing ulcers Pulmonary:   [] Uses home oxygen   [] Productive cough   [] Hemoptysis   [] Wheeze  [] COPD   [] Asthma Neurologic:  [x] Dizziness   [] Seizures   [] History of stroke   [] History of TIA  [] Aphasia   [x] Vissual changes   [] Weakness or numbness in arm   [] Weakness or numbness in leg Musculoskeletal:   [] Joint swelling   [] Joint pain   [] Low back pain Hematologic:  [] Easy bruising  [] Easy bleeding   [] Hypercoagulable state   [] Anemic Gastrointestinal:  [] Diarrhea   [] Vomiting  [] Gastroesophageal reflux/heartburn   [] Difficulty swallowing. Genitourinary:  [] Chronic kidney disease   [] Difficult urination  [] Frequent urination   [] Blood in urine Skin:  [] Rashes   [] Ulcers  Psychological:  [] History of anxiety   []  History of major depression.  Physical Examination  Vitals:   11/10/17 1435  BP: 140/68  Pulse: 66  Resp: 16  Weight: 147 lb 6.4 oz (66.9 kg)  Height: 5\' 7"  (1.702 m)   Body mass index is 23.09 kg/m. Gen: WD/WN, NAD Head: [place]/AT, No temporalis wasting.  Ear/Nose/Throat: Hearing grossly intact, nares w/o erythema or drainage Eyes: PER, EOMI, sclera nonicteric.  Neck: Supple, no large masses.   Pulmonary:  Good air movement, no audible wheezing bilaterally, no use of accessory muscles.  Cardiac: RRR, no JVD Vascular: Bilateral carotid bruits Vessel Right Left  Radial Palpable Palpable  Ulnar Palpable Palpable  Brachial Palpable Palpable  Carotid Palpable  Palpable  Gastrointestinal: Non-distended. No guarding/no peritoneal signs.  Musculoskeletal: M/S 5/5 throughout.  No deformity or atrophy.  Neurologic: CN 2-12 intact. Symmetrical.  Speech is fluent. Motor exam as listed above. Psychiatric: Judgment intact, Mood & affect appropriate for pt's clinical situation. Dermatologic: No rashes or ulcers noted.  No changes consistent with cellulitis. Lymph : No lichenification or skin changes of chronic lymphedema.  CBC Lab Results  Component Value Date   WBC 6.8 11/22/2015   HGB 12.5 11/22/2015   HCT 36.7 11/22/2015   MCV 90.4 11/22/2015   PLT 221 11/22/2015    BMET  Component Value Date/Time   NA 138 11/22/2015 2143   NA 139 09/12/2011 0250   K 3.4 (L) 11/22/2015 2143   K 3.5 09/12/2011 0250   CL 104 11/22/2015 2143   CL 102 09/12/2011 0250   CO2 27 11/22/2015 2143   CO2 23 09/12/2011 0250   GLUCOSE 151 (H) 11/22/2015 2143   GLUCOSE 162 (H) 09/12/2011 0250   BUN 20 05/30/2016 1358   BUN 23 (H) 09/12/2011 0250   CREATININE 1.10 (H) 05/30/2016 1358   CREATININE 0.94 09/12/2011 0250   CALCIUM 10.0 11/22/2015 2143   CALCIUM 9.1 09/12/2011 0250   GFRNONAA 47 (L) 05/30/2016 1358   GFRNONAA >60 09/12/2011 0250   GFRAA 54 (L) 05/30/2016 1358   GFRAA >60 09/12/2011 0250   CrCl cannot be calculated (Patient's most recent lab result is older than the maximum 21 days allowed.).  COAG No results found for: INR, PROTIME  Radiology Ct Soft Tissue Neck W Contrast  Result Date: 10/24/2017 CLINICAL DATA:  Right neck mass EXAM: CT NECK WITH CONTRAST TECHNIQUE: Multidetector CT imaging of the neck was performed using the standard protocol following the bolus administration of intravenous contrast. CONTRAST:  46mL ISOVUE-300 IOPAMIDOL (ISOVUE-300) INJECTION 61% COMPARISON:  Thyroid ultrasound 02/12/2013 FINDINGS: Pharynx and larynx: Tongue and oropharynx normal. 9 x 17 mm cyst in the vallecula on the left. This may be arising from base of  tongue. This has homogeneous fluid intensity and appears benign. Direct visualization recommended. Airway intact. Larynx normal Salivary glands: No inflammation, mass, or stone. Thyroid: Mild thyroid enlargement bilaterally. 3 mm left upper pole nodule Lymph nodes: No enlarged lymph nodes. Vascular: Atherosclerotic disease of the carotid bifurcation bilaterally. There appears to be significant carotid stenosis bilaterally. Limited intracranial: Negative Visualized orbits: Right cataract removal. Left orbit not fully visualized. Mastoids and visualized paranasal sinuses: Negative Skeleton: Cervical spondylosis.  No acute bony abnormality. Upper chest: Lung apices clear bilaterally. Other: None IMPRESSION: Negative for  adenopathy in the neck 3 mm left thyroid nodule 9 x 17 mm cyst in the vallecula on the left. By imaging, this appears benign. Direct visualization recommended. Carotid artery disease with significant carotid stenosis bilaterally. Electronically Signed   By: Franchot Gallo M.D.   On: 10/24/2017 12:40   US Arterial Lower Extremity Duplex Bilateral  Result Date: 10/23/2017 CLINICAL DATA:  84 year old female with bilateral lower extremity pain EXAM: BILATERAL LOWER EXTREMITY ARTERIAL DUPLEX SCAN TECHNIQUE: Gray-scale sonography as well as color Doppler and duplex ultrasound was performed to evaluate the arteries of both lower extremities including the common, superficial and profunda femoral arteries, popliteal artery and calf arteries. COMPARISON:  None. FINDINGS: Right Lower Extremity Inflow: Normal common femoral arterial waveforms and velocities. No evidence of inflow (aortoiliac) disease. Outflow: Normal profunda femoral, superficial femoral and popliteal arterial waveforms and velocities. No focal elevation of the PSV to suggest stenosis. Runoff: Normal posterior and anterior tibial arterial waveforms and velocities. Vessels are patent to the ankle. Left Lower Extremity Inflow: Normal common  femoral arterial waveforms and velocities. No evidence of inflow (aortoiliac) disease. Outflow: Normal profunda femoral, superficial femoral and popliteal arterial waveforms and velocities. No focal elevation of the PSV to suggest stenosis. Runoff: Normal posterior and anterior tibial arterial waveforms and velocities. Vessels are patent to the ankle. IMPRESSION: 1. Scattered atherosclerotic plaques noted throughout the bilateral lower extremity arterial trees consistent with at least mild peripheral arterial disease. 2. However, there is no evidence of hemodynamically significant focal stenosis or occlusion. Signed, Criselda Peaches, MD  [facility] Radiology Electronically Signed   By: Jacqulynn Cadet M.D.   On: 10/23/2017 13:40    Assessment/Plan  1. Stenosis of carotid artery, unspecified laterality The patient symptomatic with respect to the carotid stenosis and she has now progressed and has a lesion the is >80%.  Patient's CT angiography of the carotid arteries CT scan is reviewed by myself and she has 80-90% stenosis of the bilateral internal carotid arteries.  I have also reviewed the scan with the radiologist given that it did have intravenous contrast but was not formatted as a CT angiogram of the neck.  In my discussion reviewing the scan together radiology does concur that there is roughly 90% bilateral stenosis however, in order to definitively report this they have requested that we repeat the study as a CT angiogram.  Given the high quality of the scan I feel that repeat is not necessary and will move forward with the current study.  Based on the degree of calcification I would favor endarterectomy over stenting.  Anatomical considerations are good for surgery.  With the exception of the extremely dense calcified lesions there are no other contraindications to stenting.  Again, as noted above I would prefer endarterectomy given the degree  of calcification.  She sees Dr. Towanda Malkin for cardiology and I will contact him and arrange a follow-up for cardiac clearance.  Should he feel she is suitable for general anesthesia we will move forward with carotid endarterectomy.   If the patient does indeed need surgery cardiac clearance will be required, once cleared the patient will be scheduled for surgery.  The risks, benefits and alternative therapies were reviewed in detail with the patient.  All questions were answered.  The patient agrees to proceed with surgery/stentng.  Continue antiplatelet therapy as prescribed. Continue management of CAD, HTN and Hyperlipidemia. Healthy heart diet, encouraged exercise at least 4 times per week.    A total of 40 minutes was spent with this patient and greater than 50% was spent in counseling and coordination of care with the patient.  Discussion included the treatment options for vascular disease including indications for surgery and intervention.  Also discussed is the appropriate timing of treatment.  In addition medical therapy was discussed.  - Ambulatory referral to Cardiology   2. Syncope and collapse See #1  - Ambulatory referral to Cardiology  3. Coronary artery disease of native artery of native heart with stable angina pectoris (HCC) Continue cardiac and antihypertensive medications as already ordered and reviewed, no changes at this time.  Continue statin as ordered and reviewed, no changes at this time  Nitrates PRN for chest pain  Consult Dr Clayborn Bigness for clearance  4. Essential hypertension Continue antihypertensive medications as already ordered, these medications have been reviewed and there are no changes at this time.   5. Type 2 diabetes mellitus with complication, without long-term current use of insulin (HCC) Continue hypoglycemic medications as already ordered, these medications have been reviewed and there are no changes at this time.  Hgb A1C to be monitored  as already arranged by primary service   6. Mixed hyperlipidemia Continue statin as ordered and reviewed, no changes at this time     [name], MD  11/15/2017 12:09 PM

## 2017-11-17 ENCOUNTER — Ambulatory Visit (INDEPENDENT_AMBULATORY_CARE_PROVIDER_SITE_OTHER): Payer: Medicare Other

## 2017-11-17 ENCOUNTER — Ambulatory Visit (INDEPENDENT_AMBULATORY_CARE_PROVIDER_SITE_OTHER): Payer: Medicare Other | Admitting: Vascular Surgery

## 2017-11-17 ENCOUNTER — Encounter (INDEPENDENT_AMBULATORY_CARE_PROVIDER_SITE_OTHER): Payer: Self-pay | Admitting: Vascular Surgery

## 2017-11-17 VITALS — BP 166/67 | HR 67 | Ht 67.0 in | Wt 147.0 lb

## 2017-11-17 VITALS — BP 171/79 | HR 62 | Resp 16 | Ht 67.0 in | Wt 148.0 lb

## 2017-11-17 DIAGNOSIS — I10 Essential (primary) hypertension: Secondary | ICD-10-CM | POA: Diagnosis not present

## 2017-11-17 DIAGNOSIS — E118 Type 2 diabetes mellitus with unspecified complications: Secondary | ICD-10-CM

## 2017-11-17 DIAGNOSIS — I6529 Occlusion and stenosis of unspecified carotid artery: Secondary | ICD-10-CM | POA: Diagnosis not present

## 2017-11-17 DIAGNOSIS — N390 Urinary tract infection, site not specified: Secondary | ICD-10-CM | POA: Diagnosis not present

## 2017-11-17 DIAGNOSIS — I25118 Atherosclerotic heart disease of native coronary artery with other forms of angina pectoris: Secondary | ICD-10-CM | POA: Diagnosis not present

## 2017-11-17 LAB — URINALYSIS, COMPLETE
BILIRUBIN UA: NEGATIVE
GLUCOSE, UA: NEGATIVE
Ketones, UA: NEGATIVE
Nitrite, UA: POSITIVE — AB
PH UA: 5.5 (ref 5.0–7.5)
Protein, UA: NEGATIVE
RBC UA: NEGATIVE
Specific Gravity, UA: 1.02 (ref 1.005–1.030)
UUROB: 0.2 mg/dL (ref 0.2–1.0)

## 2017-11-17 LAB — MICROSCOPIC EXAMINATION
Epithelial Cells (non renal): NONE SEEN /hpf (ref 0–10)
RBC, UA: NONE SEEN /hpf (ref 0–2)

## 2017-11-17 MED ORDER — NITROFURANTOIN MONOHYD MACRO 100 MG PO CAPS
100.0000 mg | ORAL_CAPSULE | Freq: Two times a day (BID) | ORAL | 0 refills | Status: DC
Start: 2017-11-17 — End: 2018-10-05

## 2017-11-17 NOTE — Progress Notes (Signed)
Pt presents today with c/o urinary frequency, dysuria, leakage of urine, and lower abd pain. A cath specimen was obtained for u/a and cx.   Blood pressure (!) 166/67, pulse 67, height 5\' 7"  (1.702 m), weight 147 lb (66.7 kg).

## 2017-11-17 NOTE — Progress Notes (Signed)
Per Dr. Erlene Quan patient notified that urine does look positive for infection will send for culture and call with results. Abx was sent to pharm to start

## 2017-11-19 ENCOUNTER — Encounter (INDEPENDENT_AMBULATORY_CARE_PROVIDER_SITE_OTHER): Payer: Self-pay | Admitting: Vascular Surgery

## 2017-11-19 NOTE — Progress Notes (Signed)
MRN : [id number]  Dawn Mcintyre is a 84 y.o. ([birth year]) female who presents with chief complaint of  Chief Complaint  Patient presents with  . Follow-up    1 week follow up  .  History of Present Illness:   The patient is seen for follow up evaluation of carotid stenosis status post CT angiogram.  Patient reports that the test went well with no problems or complications.   The patient denies interval amaurosis fugax. There is no recent or interval TIA symptoms or focal motor deficits. There is no prior documented CVA.  The patient is taking enteric-coated aspirin 81 mg daily.  There is no history of migraine headaches. There is no history of seizures.  The patient has a history of coronary artery disease, no recent episodes of angina or shortness of breath. The patient denies PAD or claudication symptoms. There is a history of hyperlipidemia which is being treated with a statin.    CT angiogram is reviewed by me personally and shows bilateral >80% stenosis consistent with calcified plaque at the origin of the  internal carotid arteries.   Current Meds  Medication Sig  . ALPRAZolam (XANAX) 0.25 MG tablet Take 0.25 mg by mouth at bedtime as needed.    Marland Kitchen amLODipine (NORVASC) 2.5 MG tablet TAKE 1 TABLET (2.5 MG TOTAL) BY MOUTH ONCE DAILY. TAKE FOR BP OVER 150/90 AS NEEDED  . aspirin 81 MG tablet Take 81 mg by mouth daily.    . cloNIDine (CATAPRES) 0.1 MG tablet Take by mouth.  . conjugated estrogens (PREMARIN) vaginal cream Place 1 Applicatorful vaginally daily. Apply 0.5mg  (pea-sized amount)  just inside the vaginal introitus with a finger-tip every night for two weeks and then Monday, Wednesday and Friday nights.  . Cranberry 400 MG CAPS Take 1 tablet by mouth 2 (two) times daily.  . cyanocobalamin (,VITAMIN B-12,) 1000 MCG/ML injection Inject into the muscle.  . fexofenadine (ALLEGRA) 180 MG tablet Take 1 tablet by mouth as needed.  . gabapentin (NEURONTIN) 100 MG capsule  Take 100 mg by mouth 3 (three) times daily.    . isosorbide mononitrate (IMDUR) 60 MG 24 hr tablet Take 60 mg by mouth daily.    Marland Kitchen losartan-hydrochlorothiazide (HYZAAR) 100-12.5 MG tablet Take by mouth.  . metoprolol tartrate (LOPRESSOR) 100 MG tablet Take by mouth.  . mirabegron ER (MYRBETRIQ) 25 MG TB24 tablet Take 1 tablet (25 mg total) daily by mouth.  . pantoprazole (PROTONIX) 40 MG tablet Take 1 tablet by mouth daily.  . predniSONE (DELTASONE) 2.5 MG tablet Take 2.5 mg by mouth daily.     Past Medical History:  Diagnosis Date  . Arthritis   . Atrophic vaginitis 11/16/2014  . Cataract   . Diabetes mellitus    border line. Pt currently is not on meds.   . Hyperlipidemia   . Hypertension   . Osteoporosis   . Pneumonia     x 80yrs ago  . Ulcer     Past Surgical History:  Procedure Laterality Date  . ABDOMINAL HYSTERECTOMY  1972  . BREAST CYST EXCISION Right 1972  . BREAST CYST EXCISION Left 1972  . EYE SURGERY    . THROAT SURGERY     x 10 yrs ago. throat mass.    Social History Social History   Tobacco Use  . Smoking status: Never Smoker  . Smokeless tobacco: Never Used  Substance Use Topics  . Alcohol use: No    Alcohol/week: 0.0 oz  .  Drug use: No    Family History Family History  Problem Relation Age of Onset  . Anuerysm Mother   . Cancer Father        prostrate cancer  . Diabetes Brother   . Hearing loss Brother   . Bladder Cancer Neg Hx   . Kidney cancer Neg Hx     Allergies  Allergen Reactions  . Amoxicillin-Pot Clavulanate Other (See Comments)  . Codeine Hives  . Hydrocodone Nausea Only  . Metronidazole Nausea Only  . Propoxyphene Nausea Only  . Latex Rash     REVIEW OF SYSTEMS (Negative unless checked)  Constitutional: [] Weight loss  [] Fever  [] Chills Cardiac: [] Chest pain   [] Chest pressure   [] Palpitations   [] Shortness of breath when laying flat   [] Shortness of breath with exertion. Vascular:  [] Pain in legs with walking   [] Pain  in legs at rest  [] History of DVT   [] Phlebitis   [] Swelling in legs   [] Varicose veins   [] Non-healing ulcers Pulmonary:   [] Uses home oxygen   [] Productive cough   [] Hemoptysis   [] Wheeze  [] COPD   [] Asthma Neurologic:  [x] Dizziness   [] Seizures   [] History of stroke   [] History of TIA  [] Aphasia   [] Vissual changes   [] Weakness or numbness in arm   [] Weakness or numbness in leg Musculoskeletal:   [] Joint swelling   [] Joint pain   [] Low back pain Hematologic:  [] Easy bruising  [] Easy bleeding   [] Hypercoagulable state   [] Anemic Gastrointestinal:  [] Diarrhea   [] Vomiting  [] Gastroesophageal reflux/heartburn   [] Difficulty swallowing. Genitourinary:  [] Chronic kidney disease   [] Difficult urination  [] Frequent urination   [] Blood in urine Skin:  [] Rashes   [] Ulcers  Psychological:  [] History of anxiety   []  History of major depression.  Physical Examination  Vitals:   11/17/17 1010  BP: (!) 171/79  Pulse: 62  Resp: 16  Weight: 67.1 kg (148 lb)  Height: 5\' 7"  (1.702 m)   Body mass index is 23.18 kg/m. Gen: WD/WN, NAD Head: [place]/AT, No temporalis wasting.  Ear/Nose/Throat: Hearing grossly intact, nares w/o erythema or drainage Eyes: PER, EOMI, sclera nonicteric.  Neck: Supple, no large masses.   Pulmonary:  Good air movement, no audible wheezing bilaterally, no use of accessory muscles.  Cardiac: RRR, no JVD Vascular: bilateral carotid bruits Vessel Right Left  Radial Palpable Palpable  Ulnar Palpable Palpable  Brachial Palpable Palpable  Carotid Palpable Palpable  Gastrointestinal: Non-distended. No guarding/no peritoneal signs.  Musculoskeletal: M/S 5/5 throughout.  No deformity or atrophy.  Neurologic: CN 2-12 intact. Symmetrical.  Speech is fluent. Motor exam as listed above. Psychiatric: Judgment intact, Mood & affect appropriate for pt's clinical situation. Dermatologic: No rashes or ulcers noted.  No changes consistent with cellulitis. Lymph : No lichenification or skin  changes of chronic lymphedema.  CBC Lab Results  Component Value Date   WBC 6.8 11/22/2015   HGB 12.5 11/22/2015   HCT 36.7 11/22/2015   MCV 90.4 11/22/2015   PLT 221 11/22/2015    BMET    Component Value Date/Time   NA 138 11/22/2015 2143   NA 139 09/12/2011 0250   K 3.4 (L) 11/22/2015 2143   K 3.5 09/12/2011 0250   CL 104 11/22/2015 2143   CL 102 09/12/2011 0250   CO2 27 11/22/2015 2143   CO2 23 09/12/2011 0250   GLUCOSE 151 (H) 11/22/2015 2143   GLUCOSE 162 (H) 09/12/2011 0250   BUN 20 05/30/2016 1358  BUN 23 (H) 09/12/2011 0250   CREATININE 1.10 (H) 05/30/2016 1358   CREATININE 0.94 09/12/2011 0250   CALCIUM 10.0 11/22/2015 2143   CALCIUM 9.1 09/12/2011 0250   GFRNONAA 47 (L) 05/30/2016 1358   GFRNONAA >60 09/12/2011 0250   GFRAA 54 (L) 05/30/2016 1358   GFRAA >60 09/12/2011 0250   CrCl cannot be calculated (Patient's most recent lab result is older than the maximum 21 days allowed.).  COAG No results found for: INR, PROTIME  Radiology Ct Soft Tissue Neck W Contrast  Result Date: 10/24/2017 CLINICAL DATA:  Right neck mass EXAM: CT NECK WITH CONTRAST TECHNIQUE: Multidetector CT imaging of the neck was performed using the standard protocol following the bolus administration of intravenous contrast. CONTRAST:  81mL ISOVUE-300 IOPAMIDOL (ISOVUE-300) INJECTION 61% COMPARISON:  Thyroid ultrasound 02/12/2013 FINDINGS: Pharynx and larynx: Tongue and oropharynx normal. 9 x 17 mm cyst in the vallecula on the left. This may be arising from base of tongue. This has homogeneous fluid intensity and appears benign. Direct visualization recommended. Airway intact. Larynx normal Salivary glands: No inflammation, mass, or stone. Thyroid: Mild thyroid enlargement bilaterally. 3 mm left upper pole nodule Lymph nodes: No enlarged lymph nodes. Vascular: Atherosclerotic disease of the carotid bifurcation bilaterally. There appears to be significant carotid stenosis bilaterally. Limited  intracranial: Negative Visualized orbits: Right cataract removal. Left orbit not fully visualized. Mastoids and visualized paranasal sinuses: Negative Skeleton: Cervical spondylosis.  No acute bony abnormality. Upper chest: Lung apices clear bilaterally. Other: None IMPRESSION: Negative for  adenopathy in the neck 3 mm left thyroid nodule 9 x 17 mm cyst in the vallecula on the left. By imaging, this appears benign. Direct visualization recommended. Carotid artery disease with significant carotid stenosis bilaterally. Electronically Signed   By: Franchot Gallo M.D.   On: 10/24/2017 12:40   US Arterial Lower Extremity Duplex Bilateral  Result Date: 10/23/2017 CLINICAL DATA:  84 year old female with bilateral lower extremity pain EXAM: BILATERAL LOWER EXTREMITY ARTERIAL DUPLEX SCAN TECHNIQUE: Gray-scale sonography as well as color Doppler and duplex ultrasound was performed to evaluate the arteries of both lower extremities including the common, superficial and profunda femoral arteries, popliteal artery and calf arteries. COMPARISON:  None. FINDINGS: Right Lower Extremity Inflow: Normal common femoral arterial waveforms and velocities. No evidence of inflow (aortoiliac) disease. Outflow: Normal profunda femoral, superficial femoral and popliteal arterial waveforms and velocities. No focal elevation of the PSV to suggest stenosis. Runoff: Normal posterior and anterior tibial arterial waveforms and velocities. Vessels are patent to the ankle. Left Lower Extremity Inflow: Normal common femoral arterial waveforms and velocities. No evidence of inflow (aortoiliac) disease. Outflow: Normal profunda femoral, superficial femoral and popliteal arterial waveforms and velocities. No focal elevation of the PSV to suggest stenosis. Runoff: Normal posterior and anterior tibial arterial waveforms and velocities. Vessels are patent to the ankle. IMPRESSION: 1. Scattered atherosclerotic plaques noted throughout the bilateral lower  extremity arterial trees consistent with at least mild peripheral arterial disease. 2. However, there is no evidence of hemodynamically significant focal stenosis or occlusion. Signed, Criselda Peaches, MD [facility] Radiology Electronically Signed   By: Jacqulynn Cadet M.D.   On: 10/23/2017 13:40      Assessment/Plan 1. Stenosis of carotid artery, unspecified laterality Recommend:  The patient is symptomatic with respect to the carotid stenosis.  However, the patient has now progressed and has a lesion the is >80%.  Patient's CT angiography of the carotid arteries confirms >80% bilateral ICA stenosis.  The anatomical considerations support surgery over stenting.  This was discussed in detail with the patient.  The patient does indeed need surgery, therefore, cardiac clearance will be arranged. Once cleared the patient will be scheduled for surgery.  The risks, benefits and alternative therapies were reviewed in detail with the patient.  All questions were answered.  The patient agrees to proceed with surgery of the left carotid artery first and then the right internal carotid artery about 8 weeks later.  Continue antiplatelet therapy as prescribed. Continue management of CAD, HTN and Hyperlipidemia. Healthy heart diet, encouraged exercise at least 4 times per week.    2. Coronary artery disease of native artery of native heart with stable angina pectoris [facility]) Consult with Dr Clayborn Bigness is pending  3. Essential hypertension Continue antihypertensive medications as already ordered, these medications have been reviewed and there are no changes at this time.   4. Type 2 diabetes mellitus with complication, without long-term current use of insulin (HCC) Continue hypoglycemic medications as already ordered, these medications have been reviewed and there are no changes at this time.  Hgb A1C to be monitored as already arranged by primary  service     [name], MD  11/19/2017 7:58 PM

## 2017-11-22 LAB — CULTURE, URINE COMPREHENSIVE

## 2017-12-04 ENCOUNTER — Encounter (INDEPENDENT_AMBULATORY_CARE_PROVIDER_SITE_OTHER): Payer: Self-pay

## 2017-12-15 ENCOUNTER — Other Ambulatory Visit (INDEPENDENT_AMBULATORY_CARE_PROVIDER_SITE_OTHER): Payer: Self-pay | Admitting: Vascular Surgery

## 2017-12-16 ENCOUNTER — Encounter
Admission: RE | Admit: 2017-12-16 | Discharge: 2017-12-16 | Disposition: A | Discharge status: Home / Self Care | Payer: Medicare Other | Source: Ambulatory Visit | Attending: Vascular Surgery | Admitting: Vascular Surgery

## 2017-12-16 ENCOUNTER — Other Ambulatory Visit: Payer: Self-pay

## 2017-12-16 DIAGNOSIS — Z01810 Encounter for preprocedural cardiovascular examination: Secondary | ICD-10-CM | POA: Insufficient documentation

## 2017-12-16 DIAGNOSIS — Z01812 Encounter for preprocedural laboratory examination: Secondary | ICD-10-CM | POA: Insufficient documentation

## 2017-12-16 DIAGNOSIS — R001 Bradycardia, unspecified: Secondary | ICD-10-CM | POA: Diagnosis not present

## 2017-12-16 HISTORY — DX: Gastro-esophageal reflux disease without esophagitis: K21.9

## 2017-12-16 HISTORY — DX: Prediabetes: R73.03

## 2017-12-16 LAB — CBC WITH DIFFERENTIAL/PLATELET
BASOS ABS: 0.1 10*3/uL (ref 0–0.1)
BASOS PCT: 1 %
Eosinophils Absolute: 0.2 10*3/uL (ref 0–0.7)
Eosinophils Relative: 2 %
HEMATOCRIT: 36.1 % (ref 35.0–47.0)
HEMOGLOBIN: 12.3 g/dL (ref 12.0–16.0)
LYMPHS PCT: 13 %
Lymphs Abs: 1.1 10*3/uL (ref 1.0–3.6)
MCH: 32.3 pg (ref 26.0–34.0)
MCHC: 34.2 g/dL (ref 32.0–36.0)
MCV: 94.4 fL (ref 80.0–100.0)
Monocytes Absolute: 0.9 10*3/uL (ref 0.2–0.9)
Monocytes Relative: 10 %
NEUTROS PCT: 74 %
Neutro Abs: 6.3 10*3/uL (ref 1.4–6.5)
Platelets: 206 10*3/uL (ref 150–440)
RBC: 3.82 MIL/uL (ref 3.80–5.20)
RDW: 13.9 % (ref 11.5–14.5)
WBC: 8.5 10*3/uL (ref 3.6–11.0)

## 2017-12-16 LAB — PROTIME-INR
INR: 1.02
Prothrombin Time: 13.3 seconds (ref 11.4–15.2)

## 2017-12-16 LAB — BASIC METABOLIC PANEL
Anion gap: 7 (ref 5–15)
BUN: 13 mg/dL (ref 6–20)
CALCIUM: 9.6 mg/dL (ref 8.9–10.3)
CO2: 30 mmol/L (ref 22–32)
Chloride: 103 mmol/L (ref 101–111)
Creatinine, Ser: 0.96 mg/dL (ref 0.44–1.00)
GFR calc Af Amer: >60 mL/min (ref >60)
GFR, EST NON AFRICAN AMERICAN: 54 mL/min — AB (ref >60)
GLUCOSE: 120 mg/dL — AB (ref 65–99)
POTASSIUM: 3.8 mmol/L (ref 3.5–5.1)
Sodium: 140 mmol/L (ref 135–145)

## 2017-12-16 LAB — SURGICAL PCR SCREEN
MRSA, PCR: POSITIVE — AB
STAPHYLOCOCCUS AUREUS: POSITIVE — AB

## 2017-12-16 LAB — APTT: APTT: 30 s (ref 24–36)

## 2017-12-16 NOTE — Patient Instructions (Signed)
  Your procedure is scheduled on: Wednesday Dec 24, 2017 Report to Same Day Surgery 2nd floor medical mall ([place] Entrance-take elevator on left to 2nd floor.  Check in with surgery information desk.) To find out your arrival time please call [phone] between 1PM - 3PM on Tuesday Dec 23, 2017  Remember: Instructions that are not followed completely may result in serious medical risk, up to and including death, or upon the discretion of your surgeon and anesthesiologist your surgery may need to be rescheduled.    _x___ 1. Do not eat food after midnight the night before your procedure. You may drink clear liquids up to 2 hours before you are scheduled to arrive at the hospital for your procedure.  Do not drink clear liquids within 2 hours of your scheduled arrival to the hospital.  Clear liquids include  --Water or Apple juice without pulp  --Clear carbohydrate beverage such as Gatorade  --Black Coffee or Clear Tea (No milk, no creamers, do not add anything to the coffee or tea   No gum chewing or hard candies.      __x__ 2. No Alcohol for 24 hours before or after surgery.   __x__3. No Smoking or e-cigarettes for 24 prior to surgery.  Do not use any chewable tobacco products for at least 6 hour prior to surgery   ____  4. Bring all medications with you on the day of surgery if instructed.    __x__ 5. Notify your doctor if there is any change in your medical condition     (cold, fever, infections).   __x__6. On the morning of surgery brush your teeth with toothpaste and water.  You may rinse your mouth with mouth wash if you wish.  Do not swallow any toothpaste or mouthwash.   Do not wear jewelry, make-up, hairpins, clips or nail polish.  Do not wear lotions, powders, deodorant, or perfumes.   Do not shave 48 hours prior to surgery.   Do not bring valuables to the hospital.    [facility] is not responsible for any belongings or valuables.               Contacts, dentures  or bridgework may not be worn into surgery.  Leave your suitcase in the car. After surgery it may be brought to your room.  For patients admitted to the hospital, discharge time is determined by your treatment team.  Please read over the following fact sheets that you were given:   [facility] Preparing for Surgery and or MRSA Information   _x___ Take anti-hypertensive listed below, cardiac, seizure, asthma, anti-reflux and psychiatric medicines. These include:  1. Amlodipine/Norvasc  2. Isosorbide/Imdur  3. Metoprolol/Lopressor  4. Pantoprazole/Protonix  5. Prednisone/Deltasone  Do not take Losartan-HCTZ on morning of surgery.  _x___ Use CHG Soap or sage wipes as directed on instruction sheet   _x___ Follow recommendations from Cardiologist, Pulmonologist or PCP regarding stopping Aspirin, Coumadin, Plavix ,Eliquis, Effient, or Pradaxa, and Pletal.  _x___ Stop Anti-inflammatories such as Advil, Aleve, Ibuprofen, Motrin, Naproxen, Naprosyn, Goodies powders or aspirin products. OK to take Tylenol and Celebrex.   _x___ Stop supplements (Cranberry) until after surgery.  But may continue Vitamin D, Vitamin B, and multivitamin.

## 2017-12-19 ENCOUNTER — Encounter
Admission: RE | Admit: 2017-12-19 | Discharge: 2017-12-19 | Disposition: A | Discharge status: Home / Self Care | Payer: Medicare Other | Source: Ambulatory Visit | Attending: Vascular Surgery | Admitting: Vascular Surgery

## 2017-12-19 DIAGNOSIS — Z01812 Encounter for preprocedural laboratory examination: Secondary | ICD-10-CM | POA: Diagnosis not present

## 2017-12-19 LAB — TYPE AND SCREEN
ABO/RH(D): O POS
Antibody Screen: NEGATIVE

## 2017-12-23 MED ORDER — VANCOMYCIN HCL IN DEXTROSE 1-5 GM/200ML-% IV SOLN
1000.0000 mg | INTRAVENOUS | Status: AC
  Administered 2017-12-24: 1000 mg via INTRAVENOUS

## 2017-12-24 ENCOUNTER — Other Ambulatory Visit: Payer: Self-pay

## 2017-12-24 ENCOUNTER — Inpatient Hospital Stay
Admission: RE | Admit: 2017-12-24 | Discharge: 2017-12-25 | DRG: 039 | Disposition: A | Discharge status: Home / Self Care | Payer: Medicare Other | Source: Ambulatory Visit | Attending: Vascular Surgery | Admitting: Vascular Surgery

## 2017-12-24 ENCOUNTER — Encounter
Admission: RE | Disposition: A | Discharge status: Home / Self Care | Payer: Self-pay | Source: Ambulatory Visit | Attending: Vascular Surgery

## 2017-12-24 ENCOUNTER — Inpatient Hospital Stay: Payer: Medicare Other | Admitting: Anesthesiology

## 2017-12-24 DIAGNOSIS — Z90710 Acquired absence of both cervix and uterus: Secondary | ICD-10-CM

## 2017-12-24 DIAGNOSIS — K219 Gastro-esophageal reflux disease without esophagitis: Secondary | ICD-10-CM | POA: Diagnosis present

## 2017-12-24 DIAGNOSIS — I6523 Occlusion and stenosis of bilateral carotid arteries: Secondary | ICD-10-CM | POA: Diagnosis present

## 2017-12-24 DIAGNOSIS — I6522 Occlusion and stenosis of left carotid artery: Secondary | ICD-10-CM

## 2017-12-24 DIAGNOSIS — Z888 Allergy status to other drugs, medicaments and biological substances status: Secondary | ICD-10-CM

## 2017-12-24 DIAGNOSIS — Z880 Allergy status to penicillin: Secondary | ICD-10-CM

## 2017-12-24 DIAGNOSIS — M810 Age-related osteoporosis without current pathological fracture: Secondary | ICD-10-CM | POA: Diagnosis present

## 2017-12-24 DIAGNOSIS — E1151 Type 2 diabetes mellitus with diabetic peripheral angiopathy without gangrene: Secondary | ICD-10-CM | POA: Diagnosis present

## 2017-12-24 DIAGNOSIS — Z91040 Latex allergy status: Secondary | ICD-10-CM

## 2017-12-24 DIAGNOSIS — Z8042 Family history of malignant neoplasm of prostate: Secondary | ICD-10-CM

## 2017-12-24 DIAGNOSIS — Z7982 Long term (current) use of aspirin: Secondary | ICD-10-CM

## 2017-12-24 DIAGNOSIS — I25118 Atherosclerotic heart disease of native coronary artery with other forms of angina pectoris: Secondary | ICD-10-CM | POA: Diagnosis present

## 2017-12-24 DIAGNOSIS — Z833 Family history of diabetes mellitus: Secondary | ICD-10-CM

## 2017-12-24 DIAGNOSIS — I10 Essential (primary) hypertension: Secondary | ICD-10-CM | POA: Diagnosis present

## 2017-12-24 DIAGNOSIS — E785 Hyperlipidemia, unspecified: Secondary | ICD-10-CM | POA: Diagnosis present

## 2017-12-24 DIAGNOSIS — Z885 Allergy status to narcotic agent status: Secondary | ICD-10-CM

## 2017-12-24 HISTORY — PX: ENDARTERECTOMY: SHX5162

## 2017-12-24 HISTORY — DX: Failed or difficult intubation, initial encounter: T88.4XXA

## 2017-12-24 LAB — GLUCOSE, CAPILLARY
GLUCOSE-CAPILLARY: 106 mg/dL — AB (ref 65–99)
GLUCOSE-CAPILLARY: 136 mg/dL — AB (ref 65–99)

## 2017-12-24 LAB — ABO/RH: ABO/RH(D): O POS

## 2017-12-24 SURGERY — ENDARTERECTOMY, CAROTID
Anesthesia: General | Laterality: Left | Wound class: Clean

## 2017-12-24 MED ORDER — HEPARIN SODIUM (PORCINE) 1000 UNIT/ML IJ SOLN
INTRAMUSCULAR | Status: DC | PRN
  Administered 2017-12-24: 7000 [IU] via INTRAVENOUS

## 2017-12-24 MED ORDER — ACETAMINOPHEN 650 MG RE SUPP: 325.0000 mg | RECTAL | Status: DC | PRN

## 2017-12-24 MED ORDER — LABETALOL HCL 5 MG/ML IV SOLN
INTRAVENOUS | Status: AC
  Administered 2017-12-24: 10 mg via INTRAVENOUS
  Filled 2017-12-24: qty 4

## 2017-12-24 MED ORDER — DOCUSATE SODIUM 100 MG PO CAPS
100.0000 mg | ORAL_CAPSULE | Freq: Every day | ORAL | Status: DC
  Administered 2017-12-25: 100 mg via ORAL
  Filled 2017-12-24: qty 1

## 2017-12-24 MED ORDER — EPHEDRINE SULFATE 50 MG/ML IJ SOLN
INTRAMUSCULAR | Status: AC
  Filled 2017-12-24: qty 1

## 2017-12-24 MED ORDER — ALPRAZOLAM 0.5 MG PO TABS
0.2500 mg | ORAL_TABLET | Freq: Every evening | ORAL | Status: DC | PRN
  Administered 2017-12-24: 0.25 mg via ORAL
  Filled 2017-12-24: qty 1

## 2017-12-24 MED ORDER — AMLODIPINE BESYLATE 5 MG PO TABS
5.0000 mg | ORAL_TABLET | Freq: Every day | ORAL | Status: DC
  Filled 2017-12-24: qty 1

## 2017-12-24 MED ORDER — LOSARTAN POTASSIUM 25 MG PO TABS
100.0000 mg | ORAL_TABLET | Freq: Every day | ORAL | Status: DC
Start: 2017-12-24 — End: 2017-12-25
  Filled 2017-12-24: qty 4

## 2017-12-24 MED ORDER — HYDRALAZINE HCL 20 MG/ML IJ SOLN
INTRAMUSCULAR | Status: AC
  Filled 2017-12-24: qty 1

## 2017-12-24 MED ORDER — PANTOPRAZOLE SODIUM 40 MG PO TBEC
40.0000 mg | DELAYED_RELEASE_TABLET | Freq: Every day | ORAL | Status: DC
  Administered 2017-12-24 – 2017-12-25 (×2): 40 mg via ORAL
  Filled 2017-12-24 (×2): qty 1

## 2017-12-24 MED ORDER — PHENYLEPHRINE HCL 10 MG/ML IJ SOLN
INTRAMUSCULAR | Status: DC | PRN
  Administered 2017-12-24: 100 ug via INTRAVENOUS

## 2017-12-24 MED ORDER — ACETAMINOPHEN 325 MG PO TABS
325.0000 mg | ORAL_TABLET | ORAL | Status: DC | PRN
  Administered 2017-12-24 – 2017-12-25 (×3): 650 mg via ORAL
  Filled 2017-12-24 (×3): qty 2

## 2017-12-24 MED ORDER — ISOSORBIDE MONONITRATE ER 60 MG PO TB24
60.0000 mg | ORAL_TABLET | Freq: Every day | ORAL | Status: DC
  Administered 2017-12-25: 60 mg via ORAL
  Filled 2017-12-24 (×2): qty 1

## 2017-12-24 MED ORDER — FENTANYL CITRATE (PF) 100 MCG/2ML IJ SOLN
INTRAMUSCULAR | Status: DC | PRN
  Administered 2017-12-24: 50 ug via INTRAVENOUS

## 2017-12-24 MED ORDER — NITROGLYCERIN IN D5W 200-5 MCG/ML-% IV SOLN
INTRAVENOUS | Status: AC
  Filled 2017-12-24: qty 250

## 2017-12-24 MED ORDER — HYDRALAZINE HCL 20 MG/ML IJ SOLN
5.0000 mg | INTRAMUSCULAR | Status: DC | PRN
  Administered 2017-12-24: 5 mg via INTRAVENOUS
  Filled 2017-12-24: qty 1

## 2017-12-24 MED ORDER — ORAL CARE MOUTH RINSE: 15.0000 mL | Freq: Two times a day (BID) | OROMUCOSAL | Status: DC

## 2017-12-24 MED ORDER — FENTANYL CITRATE (PF) 100 MCG/2ML IJ SOLN
25.0000 ug | INTRAMUSCULAR | Status: DC | PRN
  Administered 2017-12-24 (×5): 25 ug via INTRAVENOUS

## 2017-12-24 MED ORDER — EVICEL 2 ML EX KIT
PACK | CUTANEOUS | Status: DC | PRN
  Administered 2017-12-24: 2 mL

## 2017-12-24 MED ORDER — ONDANSETRON HCL 4 MG/2ML IJ SOLN: 4.0000 mg | Freq: Four times a day (QID) | INTRAMUSCULAR | Status: DC | PRN

## 2017-12-24 MED ORDER — LOSARTAN POTASSIUM-HCTZ 100-12.5 MG PO TABS: 1.0000 | ORAL_TABLET | Freq: Every day | ORAL | Status: DC

## 2017-12-24 MED ORDER — PHENOL 1.4 % MT LIQD
1.0000 | OROMUCOSAL | Status: DC | PRN
  Administered 2017-12-24: 1 via OROMUCOSAL
  Filled 2017-12-24: qty 177

## 2017-12-24 MED ORDER — LACTATED RINGERS IV SOLN
INTRAVENOUS | Status: DC
  Administered 2017-12-24: 10:00:00 via INTRAVENOUS

## 2017-12-24 MED ORDER — PREDNISONE 2.5 MG PO TABS
2.5000 mg | ORAL_TABLET | Freq: Every day | ORAL | Status: DC
  Administered 2017-12-25: 2.5 mg via ORAL
  Filled 2017-12-24 (×2): qty 1

## 2017-12-24 MED ORDER — METOPROLOL TARTRATE 5 MG/5ML IV SOLN: 2.0000 mg | INTRAVENOUS | Status: DC | PRN

## 2017-12-24 MED ORDER — EVICEL 2 ML EX KIT
PACK | CUTANEOUS | Status: AC
  Filled 2017-12-24: qty 1

## 2017-12-24 MED ORDER — REMIFENTANIL HCL 1 MG IV SOLR
INTRAVENOUS | Status: DC | PRN
  Administered 2017-12-24: .05 ug/kg/min via INTRAVENOUS

## 2017-12-24 MED ORDER — METOPROLOL TARTRATE 25 MG PO TABS
25.0000 mg | ORAL_TABLET | Freq: Two times a day (BID) | ORAL | Status: DC
  Administered 2017-12-25: 25 mg via ORAL
  Filled 2017-12-24 (×2): qty 1

## 2017-12-24 MED ORDER — HYDROCHLOROTHIAZIDE 12.5 MG PO CAPS
12.5000 mg | ORAL_CAPSULE | Freq: Every day | ORAL | Status: DC
  Filled 2017-12-24 (×2): qty 1

## 2017-12-24 MED ORDER — CLONIDINE HCL 0.1 MG PO TABS
0.2000 mg | ORAL_TABLET | Freq: Three times a day (TID) | ORAL | Status: DC
  Filled 2017-12-24 (×3): qty 2

## 2017-12-24 MED ORDER — PROPOFOL 10 MG/ML IV BOLUS
INTRAVENOUS | Status: AC
  Filled 2017-12-24: qty 40

## 2017-12-24 MED ORDER — OXYCODONE HCL 5 MG PO TABS
5.0000 mg | ORAL_TABLET | ORAL | Status: DC | PRN
  Administered 2017-12-24: 5 mg via ORAL
  Filled 2017-12-24: qty 1

## 2017-12-24 MED ORDER — GLYCOPYRROLATE 0.2 MG/ML IJ SOLN
INTRAMUSCULAR | Status: DC | PRN
  Administered 2017-12-24: 0.2 mg via INTRAVENOUS

## 2017-12-24 MED ORDER — GABAPENTIN 100 MG PO CAPS
100.0000 mg | ORAL_CAPSULE | Freq: Three times a day (TID) | ORAL | Status: DC
  Administered 2017-12-24 (×2): 100 mg via ORAL
  Filled 2017-12-24 (×3): qty 1

## 2017-12-24 MED ORDER — PROPOFOL 10 MG/ML IV BOLUS
INTRAVENOUS | Status: DC | PRN
  Administered 2017-12-24: 40 mg via INTRAVENOUS
  Administered 2017-12-24: 110 mg via INTRAVENOUS

## 2017-12-24 MED ORDER — ATORVASTATIN CALCIUM 20 MG PO TABS
20.0000 mg | ORAL_TABLET | Freq: Every day | ORAL | Status: DC
  Administered 2017-12-24: 20 mg via ORAL
  Filled 2017-12-24 (×2): qty 1

## 2017-12-24 MED ORDER — CHLORHEXIDINE GLUCONATE CLOTH 2 % EX PADS: 6.0000 | MEDICATED_PAD | Freq: Once | CUTANEOUS | Status: DC

## 2017-12-24 MED ORDER — FENTANYL CITRATE (PF) 100 MCG/2ML IJ SOLN
INTRAMUSCULAR | Status: AC
  Administered 2017-12-24: 25 ug via INTRAVENOUS
  Filled 2017-12-24: qty 2

## 2017-12-24 MED ORDER — ALUM & MAG HYDROXIDE-SIMETH 200-200-20 MG/5ML PO SUSP
15.0000 mL | ORAL | Status: DC | PRN
  Filled 2017-12-24: qty 30

## 2017-12-24 MED ORDER — DEXAMETHASONE SODIUM PHOSPHATE 10 MG/ML IJ SOLN
INTRAMUSCULAR | Status: DC | PRN
  Administered 2017-12-24: 5 mg via INTRAVENOUS

## 2017-12-24 MED ORDER — VANCOMYCIN HCL IN DEXTROSE 1-5 GM/200ML-% IV SOLN
INTRAVENOUS | Status: AC
  Filled 2017-12-24: qty 200

## 2017-12-24 MED ORDER — SODIUM CHLORIDE 0.9 % IV SOLN: 500.0000 mL | Freq: Once | INTRAVENOUS | Status: DC | PRN

## 2017-12-24 MED ORDER — EPHEDRINE SULFATE 50 MG/ML IJ SOLN
INTRAMUSCULAR | Status: DC | PRN
  Administered 2017-12-24 (×2): 5 mg via INTRAVENOUS
  Administered 2017-12-24 (×2): 10 mg via INTRAVENOUS
  Administered 2017-12-24 (×2): 5 mg via INTRAVENOUS
  Administered 2017-12-24 (×2): 10 mg via INTRAVENOUS
  Administered 2017-12-24: 5 mg via INTRAVENOUS

## 2017-12-24 MED ORDER — NITROFURANTOIN MONOHYD MACRO 100 MG PO CAPS
100.0000 mg | ORAL_CAPSULE | Freq: Two times a day (BID) | ORAL | Status: DC
  Administered 2017-12-24 – 2017-12-25 (×3): 100 mg via ORAL
  Filled 2017-12-24 (×4): qty 1

## 2017-12-24 MED ORDER — ONDANSETRON HCL 4 MG/2ML IJ SOLN
INTRAMUSCULAR | Status: DC | PRN
  Administered 2017-12-24: 4 mg via INTRAVENOUS

## 2017-12-24 MED ORDER — HEPARIN SODIUM (PORCINE) 5000 UNIT/ML IJ SOLN
INTRAMUSCULAR | Status: AC
  Filled 2017-12-24: qty 1

## 2017-12-24 MED ORDER — MIRABEGRON ER 25 MG PO TB24
25.0000 mg | ORAL_TABLET | Freq: Every day | ORAL | Status: DC
  Administered 2017-12-24 – 2017-12-25 (×2): 25 mg via ORAL
  Filled 2017-12-24 (×2): qty 1

## 2017-12-24 MED ORDER — SODIUM CHLORIDE 0.9 % IV SOLN
INTRAVENOUS | Status: DC
  Administered 2017-12-24: 1000 mL via INTRAVENOUS

## 2017-12-24 MED ORDER — ASPIRIN EC 81 MG PO TBEC
81.0000 mg | DELAYED_RELEASE_TABLET | Freq: Every day | ORAL | Status: DC
  Administered 2017-12-24 – 2017-12-25 (×2): 81 mg via ORAL
  Filled 2017-12-24 (×2): qty 1

## 2017-12-24 MED ORDER — SODIUM CHLORIDE 0.9 % IV SOLN
INTRAVENOUS | Status: DC | PRN
  Administered 2017-12-24: 20 ug/min via INTRAVENOUS

## 2017-12-24 MED ORDER — LABETALOL HCL 5 MG/ML IV SOLN
10.0000 mg | INTRAVENOUS | Status: DC | PRN
  Administered 2017-12-24: 10 mg via INTRAVENOUS

## 2017-12-24 MED ORDER — ROCURONIUM BROMIDE 100 MG/10ML IV SOLN
INTRAVENOUS | Status: DC | PRN
  Administered 2017-12-24: 10 mg via INTRAVENOUS
  Administered 2017-12-24: 40 mg via INTRAVENOUS
  Administered 2017-12-24 (×2): 10 mg via INTRAVENOUS

## 2017-12-24 MED ORDER — FENTANYL CITRATE (PF) 250 MCG/5ML IJ SOLN
INTRAMUSCULAR | Status: AC
  Filled 2017-12-24: qty 5

## 2017-12-24 MED ORDER — MORPHINE SULFATE (PF) 2 MG/ML IV SOLN: 2.0000 mg | INTRAVENOUS | Status: DC | PRN

## 2017-12-24 MED ORDER — REMIFENTANIL HCL 1 MG IV SOLR
INTRAVENOUS | Status: AC
  Filled 2017-12-24: qty 1000

## 2017-12-24 MED ORDER — SUGAMMADEX SODIUM 200 MG/2ML IV SOLN
INTRAVENOUS | Status: DC | PRN
  Administered 2017-12-24: 150 mg via INTRAVENOUS

## 2017-12-24 MED ORDER — NITROGLYCERIN 0.2 MG/ML ON CALL CATH LAB
INTRAVENOUS | Status: DC | PRN
  Administered 2017-12-24: 40 ug via INTRAVENOUS
  Administered 2017-12-24: 10 ug via INTRAVENOUS
  Administered 2017-12-24 (×2): 20 ug via INTRAVENOUS
  Administered 2017-12-24: 10 ug via INTRAVENOUS
  Administered 2017-12-24 (×3): 40 ug via INTRAVENOUS

## 2017-12-24 MED ORDER — CLINDAMYCIN PHOSPHATE 300 MG/50ML IV SOLN
300.0000 mg | Freq: Three times a day (TID) | INTRAVENOUS | Status: AC
  Administered 2017-12-24 – 2017-12-25 (×3): 300 mg via INTRAVENOUS
  Filled 2017-12-24 (×3): qty 50

## 2017-12-24 MED ORDER — LIDOCAINE HCL (CARDIAC) PF 100 MG/5ML IV SOSY
PREFILLED_SYRINGE | INTRAVENOUS | Status: DC | PRN
  Administered 2017-12-24: 100 mg via INTRAVENOUS

## 2017-12-24 MED ORDER — KCL IN DEXTROSE-NACL 20-5-0.9 MEQ/L-%-% IV SOLN
INTRAVENOUS | Status: DC
  Administered 2017-12-24: 12:00:00 via INTRAVENOUS
  Administered 2017-12-25: 75 mL/h via INTRAVENOUS
  Filled 2017-12-24 (×4): qty 1000

## 2017-12-24 MED ORDER — CHLORHEXIDINE GLUCONATE 0.12 % MT SOLN
15.0000 mL | Freq: Two times a day (BID) | OROMUCOSAL | Status: DC
  Administered 2017-12-24 (×2): 15 mL via OROMUCOSAL
  Filled 2017-12-24 (×2): qty 15

## 2017-12-24 MED ORDER — SODIUM CHLORIDE 0.9 % IJ SOLN
INTRAMUSCULAR | Status: AC
  Filled 2017-12-24: qty 10

## 2017-12-24 MED ORDER — ASPIRIN 81 MG PO TABS: 81.0000 mg | ORAL_TABLET | Freq: Every day | ORAL | Status: DC

## 2017-12-24 MED ORDER — LIDOCAINE HCL (PF) 1 % IJ SOLN
INTRAMUSCULAR | Status: AC
  Filled 2017-12-24: qty 30

## 2017-12-24 MED ORDER — SODIUM CHLORIDE 0.9 % IV SOLN
INTRAVENOUS | Status: DC | PRN
  Administered 2017-12-24: 200 mL via INTRAMUSCULAR

## 2017-12-24 SURGICAL SUPPLY — 66 items
APPLIER CLIP 11 MED OPEN (CLIP)
APPLIER CLIP 9.375 SM OPEN (CLIP)
BAG DECANTER FOR FLEXI CONT (MISCELLANEOUS) ×3 IMPLANT
BLADE SURG 15 STRL LF DISP TIS (BLADE) ×1 IMPLANT
BLADE SURG 15 STRL SS (BLADE) ×2
BLADE SURG SZ11 CARB STEEL (BLADE) ×3 IMPLANT
BOOT SUTURE AID YELLOW STND (SUTURE) ×6 IMPLANT
BRUSH SCRUB EZ  4% CHG (MISCELLANEOUS) ×2
BRUSH SCRUB EZ 4% CHG (MISCELLANEOUS) ×1 IMPLANT
CANISTER SUCT 1200ML W/VALVE (MISCELLANEOUS) ×3 IMPLANT
CLIP APPLIE 11 MED OPEN (CLIP) IMPLANT
CLIP APPLIE 9.375 SM OPEN (CLIP) IMPLANT
CLIP TI WIDE RED SMALL 6 (CLIP) ×3 IMPLANT
DERMABOND ADVANCED (GAUZE/BANDAGES/DRESSINGS) ×2
DERMABOND ADVANCED .7 DNX12 (GAUZE/BANDAGES/DRESSINGS) ×1 IMPLANT
DRAPE INCISE IOBAN 66X45 STRL (DRAPES) ×3 IMPLANT
DRAPE LAPAROTOMY 100X77 ABD (DRAPES) ×3 IMPLANT
DRAPE SHEET LG 3/4 BI-LAMINATE (DRAPES) ×3 IMPLANT
DRESSING SURGICEL FIBRLLR 1X2 (HEMOSTASIS) ×2 IMPLANT
DRSG SURGICEL FIBRILLAR 1X2 (HEMOSTASIS) ×6
DURAPREP 26ML APPLICATOR (WOUND CARE) ×3 IMPLANT
ELECT CAUTERY BLADE 6.4 (BLADE) ×3 IMPLANT
ELECT REM PT RETURN 9FT ADLT (ELECTROSURGICAL) ×3
ELECTRODE REM PT RTRN 9FT ADLT (ELECTROSURGICAL) ×1 IMPLANT
EVICEL AIRLESS SPRAY ACCES (MISCELLANEOUS) ×3 IMPLANT
GLOVE BIO SURGEON STRL SZ7 (GLOVE) ×3 IMPLANT
GLOVE INDICATOR 7.5 STRL GRN (GLOVE) ×3 IMPLANT
GLOVE SURG SYN 8.0 (GLOVE) ×3 IMPLANT
GOWN STRL REUS W/ TWL LRG LVL3 (GOWN DISPOSABLE) ×2 IMPLANT
GOWN STRL REUS W/ TWL XL LVL3 (GOWN DISPOSABLE) ×1 IMPLANT
GOWN STRL REUS W/TWL LRG LVL3 (GOWN DISPOSABLE) ×4
GOWN STRL REUS W/TWL XL LVL3 (GOWN DISPOSABLE) ×2
HOLDER FOLEY CATH W/STRAP (MISCELLANEOUS) ×3 IMPLANT
IV NS 500ML (IV SOLUTION) ×2
IV NS 500ML BAXH (IV SOLUTION) ×1 IMPLANT
KIT TURNOVER KIT A (KITS) ×3 IMPLANT
LABEL OR SOLS (LABEL) ×3 IMPLANT
LOOP RED MAXI  1X406MM (MISCELLANEOUS) ×4
LOOP VESSEL MAXI 1X406 RED (MISCELLANEOUS) ×2 IMPLANT
LOOP VESSEL MINI 0.8X406 BLUE (MISCELLANEOUS) ×1 IMPLANT
LOOPS BLUE MINI 0.8X406MM (MISCELLANEOUS) ×2
NEEDLE FILTER BLUNT 18X 1/2SAF (NEEDLE) ×2
NEEDLE FILTER BLUNT 18X1 1/2 (NEEDLE) ×1 IMPLANT
NEEDLE HYPO 25X1 1.5 SAFETY (NEEDLE) ×3 IMPLANT
NS IRRIG 500ML POUR BTL (IV SOLUTION) ×3 IMPLANT
PACK BASIN MAJOR ARMC (MISCELLANEOUS) ×3 IMPLANT
PENCIL ELECTRO HAND CTR (MISCELLANEOUS) IMPLANT
SHUNT CAROTID STR REINF 3.0X4. (MISCELLANEOUS) ×3 IMPLANT
SUT MNCRL+ 5-0 UNDYED PC-3 (SUTURE) ×1 IMPLANT
SUT MONOCRYL 5-0 (SUTURE) ×2
SUT PROLENE 6 0 BV (SUTURE) ×24 IMPLANT
SUT PROLENE 7 0 BV 1 (SUTURE) ×18 IMPLANT
SUT SILK 2 0 (SUTURE) ×2
SUT SILK 2-0 18XBRD TIE 12 (SUTURE) ×1 IMPLANT
SUT SILK 3 0 (SUTURE) ×2
SUT SILK 3-0 18XBRD TIE 12 (SUTURE) ×1 IMPLANT
SUT SILK 4 0 (SUTURE) ×2
SUT SILK 4-0 18XBRD TIE 12 (SUTURE) ×1 IMPLANT
SUT VIC AB 3-0 SH 27 (SUTURE) ×2
SUT VIC AB 3-0 SH 27X BRD (SUTURE) ×1 IMPLANT
SYR 10ML LL (SYRINGE) ×3 IMPLANT
SYR 20CC LL (SYRINGE) ×3 IMPLANT
SYR 3ML LL SCALE MARK (SYRINGE) ×3 IMPLANT
TRAY FOLEY MTR SLVR 16FR STAT (SET/KITS/TRAYS/PACK) ×3 IMPLANT
TUBING CONNECTING 10 (TUBING) IMPLANT
TUBING CONNECTING 10' (TUBING)

## 2017-12-24 NOTE — Anesthesia Procedure Notes (Signed)
Procedure Name: Intubation Date/Time: 12/24/2017 7:41 AM Performed by: Andria Frames, MD Pre-anesthesia Checklist: Patient identified, Emergency Drugs available, Suction available, Patient being monitored and Timeout performed Patient Re-evaluated:Patient Re-evaluated prior to induction Oxygen Delivery Method: Circle system utilized Preoxygenation: Pre-oxygenation with 100% oxygen Induction Type: IV induction Ventilation: Mask ventilation without difficulty Laryngoscope Size: McGraph and 3 Grade View: Grade II Tube type: Oral Number of attempts: 1 Airway Equipment and Method: Video-laryngoscopy and Stylet Placement Confirmation: ETT inserted through vocal cords under direct vision,  positive ETCO2 and breath sounds checked- equal and bilateral Secured at: 22 cm Tube secured with: Tape Dental Injury: Teeth and Oropharynx as per pre-operative assessment  Difficulty Due To: Difficulty was anticipated and Difficult Airway- due to anterior larynx Future Recommendations: Recommend- induction with short-acting agent, and alternative techniques readily available

## 2017-12-24 NOTE — Anesthesia Post-op Follow-up Note (Signed)
Anesthesia QCDR form completed.        

## 2017-12-24 NOTE — H&P (Signed)
[facility] Admission History & Physical  MRN : [id number]  Dawn Mcintyre is a 84 y.o. (03-25-1933) female who presents with chief complaint of No chief complaint on file. Marland Kitchen  History of Present Illness:  The patient denies interval amaurosis fugax. There is no recent or interval TIA symptoms or focal motor deficits. There is no prior documented CVA.  The patient is taking enteric-coated aspirin 81 mg daily.  There is no history of migraine headaches. There is no history of seizures.  The patient has a history of coronary artery disease, no recent episodes of angina or shortness of breath. The patient denies PAD or claudication symptoms. There is a history of hyperlipidemia which is being treated with a statin.   CT angiogram is reviewed by me personally and shows bilateral >80% stenosis consistent with calcified plaque at the origin of the  internal carotid arteries.     Current Facility-Administered Medications  Medication Dose Route Frequency Provider Last Rate Last Dose  . 0.9 %  sodium chloride infusion   Intravenous Continuous [place] Hair, Dolores Lory, MD 50 mL/hr at 12/24/17 0649 1,000 mL at 12/24/17 0649  . 0.9 %  sodium chloride infusion   Intravenous Continuous Elison Worrel, Dolores Lory, MD 50 mL/hr at 12/24/17 0650 1,000 mL at 12/24/17 0650  . Chlorhexidine Gluconate Cloth 2 % PADS 6 each  6 each Topical Once Stegmayer, Kimberly A, PA-C       And  . Chlorhexidine Gluconate Cloth 2 % PADS 6 each  6 each Topical Once Stegmayer, Kimberly A, PA-C      . lactated ringers infusion   Intravenous Continuous Penwarden, Amy, MD      . vancomycin (VANCOCIN) 1-5 GM/200ML-% IVPB           . vancomycin (VANCOCIN) IVPB 1000 mg/200 mL premix  1,000 mg Intravenous On Call to Ramona, Kimberly A, PA-C 200 mL/hr at 12/24/17 0655 1,000 mg at 12/24/17 3716    Past Medical History:  Diagnosis Date  . Arthritis   . Atrophic vaginitis 11/16/2014  . Cataract   . GERD  (gastroesophageal reflux disease)   . Hyperlipidemia   . Hypertension   . Osteoporosis   . Pneumonia     x 15yrs ago  . Pre-diabetes   . Ulcer     Past Surgical History:  Procedure Laterality Date  . ABDOMINAL HYSTERECTOMY  1972  . BREAST CYST EXCISION Right 1972  . BREAST CYST EXCISION Left 1972  . EYE SURGERY    . THROAT SURGERY     x 10 yrs ago. throat mass.    Social History Social History   Tobacco Use  . Smoking status: Never Smoker  . Smokeless tobacco: Never Used  Substance Use Topics  . Alcohol use: No    Alcohol/week: 0.0 oz  . Drug use: No    Family History Family History  Problem Relation Age of Onset  . Anuerysm Mother   . Cancer Father        prostrate cancer  . Diabetes Brother   . Hearing loss Brother   . Bladder Cancer Neg Hx   . Kidney cancer Neg Hx   No family history of bleeding/clotting disorders, porphyria or autoimmune disease   Allergies  Allergen Reactions  . Amoxicillin-Pot Clavulanate Other (See Comments)  . Codeine Hives  . Hydrocodone Nausea Only  . Metronidazole Nausea Only  . Propoxyphene Nausea Only  . Latex Rash     REVIEW OF SYSTEMS (Negative  unless checked)  Constitutional: [] Weight loss  [] Fever  [] Chills Cardiac: [] Chest pain   [] Chest pressure   [] Palpitations   [] Shortness of breath when laying flat   [] Shortness of breath at rest   [] Shortness of breath with exertion. Vascular:  [] Pain in legs with walking   [] Pain in legs at rest   [] Pain in legs when laying flat   [] Claudication   [] Pain in feet when walking  [] Pain in feet at rest  [] Pain in feet when laying flat   [] History of DVT   [] Phlebitis   [] Swelling in legs   [] Varicose veins   [] Non-healing ulcers Pulmonary:   [] Uses home oxygen   [] Productive cough   [] Hemoptysis   [] Wheeze  [] COPD   [] Asthma Neurologic:  [x] Dizziness  [] Blackouts   [] Seizures   [] History of stroke   [] History of TIA  [] Aphasia   [] Temporary blindness   [] Dysphagia   [] Weakness or  numbness in arms   [] Weakness or numbness in legs Musculoskeletal:  [] Arthritis   [] Joint swelling   [] Joint pain   [] Low back pain Hematologic:  [] Easy bruising  [] Easy bleeding   [] Hypercoagulable state   [] Anemic  [] Hepatitis Gastrointestinal:  [] Blood in stool   [] Vomiting blood  [] Gastroesophageal reflux/heartburn   [] Difficulty swallowing. Genitourinary:  [] Chronic kidney disease   [] Difficult urination  [] Frequent urination  [] Burning with urination   [] Blood in urine Skin:  [] Rashes   [] Ulcers   [] Wounds Psychological:  [] History of anxiety   []  History of major depression.  Physical Examination  Vitals:   12/24/17 0623 12/24/17 0650  BP: (!) 216/107 (!) 185/77  Pulse: 65   Resp: 14   Temp: 97.7 F (36.5 C)   TempSrc: Tympanic   SpO2: 98%   Weight: 146 lb (66.2 kg)   Height: 5\' 7"  (1.702 m)    Body mass index is 22.87 kg/m. Gen: WD/WN, NAD Head: [place]/AT, No temporalis wasting. Prominent temp pulse not noted. Ear/Nose/Throat: Hearing grossly intact, nares w/o erythema or drainage, oropharynx w/o Erythema/Exudate,  Eyes: Conjunctiva clear, sclera non-icteric Neck: Trachea midline.  No JVD.  Pulmonary:  Good air movement, respirations not labored, no use of accessory muscles.  Cardiac: RRR, normal S1, S2. Vascular:  Bilateral carotid bruits Vessel Right Left  Radial Palpable Palpable  Ulnar Palpable Palpable  Brachial Palpable Palpable  Carotid Palpable, with bruit Palpable, with bruit   Gastrointestinal: soft, non-tender/non-distended. No guarding/reflex.  Musculoskeletal: M/S 5/5 throughout.  Extremities without ischemic changes.  No deformity or atrophy.  Neurologic: Sensation grossly intact in extremities.  Symmetrical.  Speech is fluent. Motor exam as listed above. Psychiatric: Judgment intact, Mood & affect appropriate for pt's clinical situation. Dermatologic: No rashes or ulcers noted.  No cellulitis or open wounds. Lymph : No Cervical, Axillary, or Inguinal  lymphadenopathy.     CBC Lab Results  Component Value Date   WBC 8.5 12/16/2017   HGB 12.3 12/16/2017   HCT 36.1 12/16/2017   MCV 94.4 12/16/2017   PLT 206 12/16/2017    BMET    Component Value Date/Time   NA 140 12/16/2017 0946   NA 139 09/12/2011 0250   K 3.8 12/16/2017 0946   K 3.5 09/12/2011 0250   CL 103 12/16/2017 0946   CL 102 09/12/2011 0250   CO2 30 12/16/2017 0946   CO2 23 09/12/2011 0250   GLUCOSE 120 (H) 12/16/2017 0946   GLUCOSE 162 (H) 09/12/2011 0250   BUN 13 12/16/2017 0946   BUN 20 05/30/2016 1358  BUN 23 (H) 09/12/2011 0250   CREATININE 0.96 12/16/2017 0946   CREATININE 0.94 09/12/2011 0250   CALCIUM 9.6 12/16/2017 0946   CALCIUM 9.1 09/12/2011 0250   GFRNONAA 54 (L) 12/16/2017 0946   GFRNONAA >60 09/12/2011 0250   GFRAA >60 12/16/2017 0946   GFRAA >60 09/12/2011 0250   Estimated Creatinine Clearance: 43.9 mL/min (by C-G formula based on SCr of 0.96 mg/dL).  COAG Lab Results  Component Value Date   INR 1.02 12/16/2017    Radiology No results found.    Assessment/Plan 1. Stenosis of carotid artery, unspecified laterality Recommend:  The patient is symptomatic with respect to the carotid stenosis.  However, the patient has now progressed and has a lesion the is >80%.  Patient's CT angiography of the carotid arteries confirms >80% bilateral ICA stenosis.  The anatomical considerations support surgery over stenting.  This was discussed in detail with the patient.  The patient does indeed need surgery, therefore, cardiac clearance has been obtained.   The risks, benefits and alternative therapies were reviewed in detail with the patient.  All questions were answered.  The patient agrees to proceed with surgery of the left carotid artery first and then the right internal carotid artery about 8 weeks later.  Continue antiplatelet therapy as prescribed. Continue management of CAD, HTN and Hyperlipidemia. Healthy heart diet, encouraged  exercise at least 4 times per week.    2. Coronary artery disease of native artery of native heart with stable angina pectoris [facility] Dr Clayborn Bigness has cleared her for surgery  3. Essential hypertension Continue antihypertensive medications as already ordered, these medications have been reviewed and there are no changes at this time.   4. Type 2 diabetes mellitus with complication, without long-term current use of insulin (HCC) Continue hypoglycemic medications as already ordered, these medications have been reviewed and there are no changes at this time.  Hgb A1C to be monitored as already arranged by primary service     [name], MD  12/24/2017 7:19 AM

## 2017-12-24 NOTE — Transfer of Care (Signed)
Immediate Anesthesia Transfer of Care Note  Patient: Dawn Mcintyre  Procedure(s) Performed: ENDARTERECTOMY CAROTID (Left )  Patient Location: PACU  Anesthesia Type:General  Level of Consciousness: drowsy  Airway & Oxygen Therapy: Patient Spontanous Breathing and Patient connected to face mask oxygen  Post-op Assessment: Report given to RN and Post -op Vital signs reviewed and stable  Post vital signs: Reviewed and stable  Last Vitals:  Vitals Value Taken Time  BP 145/65 12/24/2017 10:40 AM  Temp    Pulse 76 12/24/2017 10:43 AM  Resp 17 12/24/2017 10:43 AM  SpO2 99 % 12/24/2017 10:43 AM  Vitals shown include unvalidated device data.  Last Pain:  Vitals:   12/24/17 0623  TempSrc: Tympanic  PainSc: 0-No pain         Complications: No apparent anesthesia complications

## 2017-12-24 NOTE — Anesthesia Procedure Notes (Signed)
Arterial Line Insertion Start/End5/22/2019 7:50 AM, 12/24/2017 8:15 AM Performed by: Andria Frames, MD, Hedda Slade, CRNA, anesthesiologist, CRNA  Patient location: Pre-op. Preanesthetic checklist: patient identified, IV checked, site marked, risks and benefits discussed, surgical consent, monitors and equipment checked, pre-op evaluation, timeout performed and anesthesia consent Patient sedated Right, ulnar was placed Catheter size: 20 Fr Hand hygiene performed , maximum sterile barriers used  and Seldinger technique used Allen's test indicative of satisfactory collateral circulation Attempts: 5 or more Procedure performed using ultrasound guided technique. Following insertion, dressing applied. Post procedure assessment: normal and unchanged  Additional procedure comments: Multiple attempts by CRNA, SRNA and MDA.  Final attempt by MDA in right ulnar.   Normal perfusion distal to artrial line post placement.Marland Kitchen

## 2017-12-24 NOTE — Op Note (Signed)
[facility]   OPERATIVE NOTE  PROCEDURE:   1.  Left carotid endarterectomy with Primary Closure  PRE-OPERATIVE DIAGNOSIS: 1.  Critical stenosis of the Left Carotid Artery 2. Hypertension  POST-OPERATIVE DIAGNOSIS: same as above   SURGEON: Katha Cabal, MD  ASSISTANT(S): None  ANESTHESIA: general  ESTIMATED BLOOD LOSS: 75 cc  FINDING(S): 1.  Extensive calcified carotid plaque.  SPECIMEN(S):  Carotid plaque (sent to Pathology)  INDICATIONS:   Dawn Mcintyre is a 84 y.o. y.o. female who presents with bilateral carotid stenosis of >80%.  The risks, benefits, and alternatives to carotid endarterectomy were discussed with the patient. The differences between carotid stenting and carotid endarterectomy were reviewed.  The patient voiced understanding and appears to be aware that the risks of carotid endarterectomy include but are not limited to: bleeding, infection, stroke, myocardial infarction, death, cranial nerve injuries both temporary and permanent, neck hematoma, possible airway compromise, labile blood pressure post-operatively, cerebral hyperperfusion syndrome, and possible need for additional interventions in the future. The patient is aware of the risks and agrees to proceed forward with the procedure.  DESCRIPTION: After full informed written consent was obtained from the patient, the patient was brought back to the operating room and placed supine upon the operating table.  Prior to induction, the patient received IV antibiotics.  After obtaining adequate anesthesia, the patient was placed a supine position with a shoulder roll in place and the patient's neck slightly hyperextended and rotated away from the surgical site.  The patient was prepped in the standard fashion for a carotid endarterectomy.    The skin incision was made in the left neck anterior to the sternocleidomastoid muscle and dissected down through the subcutaneous tissue.  The  platysmas was opened with electrocautery.  The internal jugular vein and facial vein were identified.  The facial vein is ligated and divided between 2-0 silk ties.  The omohyoid was identified in the common carotid artery exposed at this level. The dissection was there in carried out along the carotid artery in a cranial direction.  The dissection was then carried along periadventitial plane along the common carotid artery up to the bifurcation. The external carotid artery was identified. Vessel loops were then placed around the external carotid artery as well as the superior thyroid artery. In the process of this dissection, the hypoglossal nerve was identified and protected from harm.  The internal carotid artery was then dissected circumferentially just beyond an area in the internal carotid artery distal to the plaque.    At this point, we gave the patient 7000 units of intravenous heparin and this was allowed to circulate for several minutes.  The common carotid artery followed by the external carotid and then the internal carotid artery were clamped.  Arteriotomy was made in the common carotid artery with a 11 blade, and extended the arteriotomy with a Potts scissor down into the common carotid artery, then the arteriotomy was carried through the bifurcation into the internal carotid artery until I reached an area that was not diseased.  At this point, a Sundt shunt was placed without difficulty.  The endarterectomy was begun in the common carotid artery with a Garment/textile technologist and carried this dissection down into the common carotid artery circumferentially.  Then I transected the plaque at a segment where it was adherent and transected the plaque with Potts scissors.  I then carried this dissection up into the external carotid artery.  The plaque was extracted by unclamping  the external carotid artery and performing an eversion endarterectomy.  The dissection was then carried into the internal  carotid artery where a  feathered end point was created.  The plaque was passed off the field as a specimen.  The distal endpoint was tacked down with 6 interrupted 7-0 Prolene sutures.  The arteriotomy was then closed using running 6-0 Prolene beginning at the distal margin and running the second 6-0 Prolene from the common carotid end.  Prior to completing the primary repair, the shunt was removed, the internal carotid artery was flushed and there was excellent backbleeding.  The carotid artery repair was flushed with heparinized saline and then the primary repair was completed in the usual fashion.  The flow was then reestablished first to the external carotid artery and then the internal carotid artery to prevent distal embolization.   Several minutes of pressure were held and 6-0 Prolene sutures were used as need for hemostasis.  At this point, I placed Surgicel and Evicel topical hemostatic agents.  There was no more active bleeding in the surgical site.  The sternocleidomastoid space was closed with three interrupted 3-0 Vicryl sutures. I then reapproximated the platysma muscle with a running stitch of 3-0 Vicryl.  The skin was then closed with a running subcuticular 4-0 Monocryl.  The skin was then cleaned, dried and Dermabond was used to reinforce the skin closure.  The patient awakened and was taken to the recovery room in stable condition, following commands and moving all four extremities without any apparent deficits.    COMPLICATIONS: none  CONDITION: stable  [name] 12/24/2017<10:17 AM

## 2017-12-24 NOTE — Anesthesia Postprocedure Evaluation (Signed)
Anesthesia Post Note  Patient: Dawn Mcintyre  Procedure(s) Performed: ENDARTERECTOMY CAROTID (Left )  Patient location during evaluation: PACU Anesthesia Type: General Level of consciousness: awake and alert Pain management: pain level controlled Vital Signs Assessment: post-procedure vital signs reviewed and stable Respiratory status: spontaneous breathing, nonlabored ventilation, respiratory function stable and patient connected to nasal cannula oxygen Cardiovascular status: blood pressure returned to baseline and stable Postop Assessment: no apparent nausea or vomiting Anesthetic complications: no     Last Vitals:  Vitals:   12/24/17 1119 12/24/17 1135  BP:  (!) 118/53  Pulse:  69  Resp:  13  Temp: 36.4 C (!) 36.4 C  SpO2:  99%    Last Pain:  Vitals:   12/24/17 1135  TempSrc: Axillary  PainSc:                  [name]

## 2017-12-24 NOTE — Anesthesia Preprocedure Evaluation (Signed)
Anesthesia Evaluation  Patient identified by MRN, date of birth, ID band Patient awake    Reviewed: Allergy & Precautions, H&P , NPO status , Patient's Chart, lab work & pertinent test results  History of Anesthesia Complications Negative for: history of anesthetic complications  Airway Mallampati: III  TM Distance: <3 FB Neck ROM: limited    Dental  (+) Chipped, Poor Dentition   Pulmonary neg shortness of breath, pneumonia,           Cardiovascular Exercise Tolerance: Good hypertension, (-) angina+ CAD and + Peripheral Vascular Disease  (-) Past MI and (-) DOE      Neuro/Psych Anxiety  Neuromuscular disease    GI/Hepatic Neg liver ROS, GERD  Medicated and Controlled,  Endo/Other  diabetes, Type 2  Renal/GU Renal disease     Musculoskeletal  (+) Arthritis ,   Abdominal   Peds  Hematology negative hematology ROS (+)   Anesthesia Other Findings Past Medical History: No date: Arthritis 11/16/2014: Atrophic vaginitis No date: Cataract No date: GERD (gastroesophageal reflux disease) No date: Hyperlipidemia No date: Hypertension No date: Osteoporosis No date: Pneumonia     Comment:   x 10yrs ago No date: Pre-diabetes No date: Ulcer  Past Surgical History: 1972: ABDOMINAL HYSTERECTOMY 1972: BREAST CYST EXCISION; Right 1972: BREAST CYST EXCISION; Left No date: EYE SURGERY No date: THROAT SURGERY     Comment:  x 10 yrs ago. throat mass.  BMI    Body Mass Index:  22.87 kg/m      Reproductive/Obstetrics negative OB ROS                             Anesthesia Physical  Anesthesia Plan  ASA: III  Anesthesia Plan: General ETT   Post-op Pain Management:    Induction: Intravenous  PONV Risk Score and Plan: Ondansetron, Dexamethasone, Midazolam and Treatment may vary due to age or medical condition  Airway Management Planned: Oral ETT and Video Laryngoscope  Planned  Additional Equipment: Arterial line  Intra-op Plan:   Post-operative Plan: Extubation in OR  Informed Consent: I have reviewed the patients History and Physical, chart, labs and discussed the procedure including the risks, benefits and alternatives for the proposed anesthesia with the patient or authorized representative who has indicated his/her understanding and acceptance.   Dental Advisory Given  Plan Discussed with: Anesthesiologist, CRNA and Surgeon  Anesthesia Plan Comments: (Patient consented for risks of anesthesia including but not limited to:  - adverse reactions to medications - damage to teeth, lips or other oral mucosa - sore throat or hoarseness - Damage to heart, brain, lungs or loss of life  Patient voiced understanding.)        Anesthesia Quick Evaluation  

## 2017-12-25 LAB — CBC
HCT: 30.4 % — ABNORMAL LOW (ref 35.0–47.0)
Hemoglobin: 10.6 g/dL — ABNORMAL LOW (ref 12.0–16.0)
MCH: 32.6 pg (ref 26.0–34.0)
MCHC: 34.8 g/dL (ref 32.0–36.0)
MCV: 93.7 fL (ref 80.0–100.0)
PLATELETS: 184 10*3/uL (ref 150–440)
RBC: 3.24 MIL/uL — ABNORMAL LOW (ref 3.80–5.20)
RDW: 13.8 % (ref 11.5–14.5)
WBC: 9 10*3/uL (ref 3.6–11.0)

## 2017-12-25 LAB — BASIC METABOLIC PANEL
ANION GAP: 6 (ref 5–15)
BUN: 10 mg/dL (ref 6–20)
CHLORIDE: 108 mmol/L (ref 101–111)
CO2: 26 mmol/L (ref 22–32)
Calcium: 8.6 mg/dL — ABNORMAL LOW (ref 8.9–10.3)
Creatinine, Ser: 0.74 mg/dL (ref 0.44–1.00)
GFR calc Af Amer: >60 mL/min (ref >60)
GFR calc non Af Amer: >60 mL/min (ref >60)
GLUCOSE: 141 mg/dL — AB (ref 65–99)
Potassium: 3.6 mmol/L (ref 3.5–5.1)
Sodium: 140 mmol/L (ref 135–145)

## 2017-12-25 LAB — GLUCOSE, CAPILLARY: Glucose-Capillary: 141 mg/dL — ABNORMAL HIGH (ref 65–99)

## 2017-12-25 NOTE — Progress Notes (Signed)
Discharge instructions reviewed with patient and patient's son with patient's permission. Patient verbalized understanding. Vital signs stable, no changes to medications. Patient left the unit via wheelchair, so to drive patient home.

## 2017-12-26 LAB — SURGICAL PATHOLOGY

## 2017-12-31 ENCOUNTER — Other Ambulatory Visit: Payer: Self-pay | Admitting: Advanced Practice Midwife

## 2018-01-08 ENCOUNTER — Ambulatory Visit (INDEPENDENT_AMBULATORY_CARE_PROVIDER_SITE_OTHER): Payer: Medicare Other | Admitting: Vascular Surgery

## 2018-01-08 ENCOUNTER — Encounter (INDEPENDENT_AMBULATORY_CARE_PROVIDER_SITE_OTHER): Payer: Self-pay | Admitting: Vascular Surgery

## 2018-01-08 VITALS — BP 132/67 | HR 69 | Resp 13 | Ht 67.0 in | Wt 147.0 lb

## 2018-01-08 DIAGNOSIS — I6523 Occlusion and stenosis of bilateral carotid arteries: Secondary | ICD-10-CM

## 2018-01-08 NOTE — Progress Notes (Signed)
Patient ID: Dawn Mcintyre, female   DOB: 1932/08/23, 84 y.o.   MRN: [id number]  Chief Complaint  Patient presents with  . Follow-up    [facility] 2 week follow up    HPI Dawn Mcintyre is a 84 y.o. female.    The patient is seen for follow up evaluation of carotid stenosis status post left carotid endarterectomy on 12/24/2017.  There were no post operative problems or complications related to the surgery.  The patient denies neck or incisional pain.  The patient denies interval amaurosis fugax. There is no recent history of TIA symptoms or focal motor deficits. There is no prior documented CVA.  The patient denies headache.  The patient is taking enteric-coated aspirin 81 mg daily.  The patient has a history of coronary artery disease, no recent episodes of angina or shortness of breath. The patient denies PAD or claudication symptoms. There is a history of hyperlipidemia which is being treated with a statin.     Past Medical History:  Diagnosis Date  . Arthritis   . Atrophic vaginitis 11/16/2014  . Cataract   . Difficult intubation   . GERD (gastroesophageal reflux disease)   . Hyperlipidemia   . Hypertension   . Osteoporosis   . Pneumonia     x 47yrs ago  . Pre-diabetes   . Ulcer     Past Surgical History:  Procedure Laterality Date  . ABDOMINAL HYSTERECTOMY  1972  . BREAST CYST EXCISION Right 1972  . BREAST CYST EXCISION Left 1972  . ENDARTERECTOMY Left 12/24/2017   Procedure: ENDARTERECTOMY CAROTID;  Surgeon: Katha Cabal, MD;  Location: [facility] ORS;  Service: Vascular;  Laterality: Left;  . EYE SURGERY    . THROAT SURGERY     x 10 yrs ago. throat mass.      Allergies  Allergen Reactions  . Amoxicillin-Pot Clavulanate Other (See Comments)  . Codeine Hives  . Hydrocodone Nausea Only  . Metronidazole Nausea Only  . Propoxyphene Nausea Only  . Latex Rash    Current Outpatient Medications  Medication Sig Dispense Refill  . ALPRAZolam (XANAX) 0.25 MG  tablet Take 0.25 mg by mouth at bedtime as needed.      Marland Kitchen amLODipine (NORVASC) 2.5 MG tablet TAKE 1 TABLET (2.5 MG TOTAL) BY MOUTH ONCE DAILY. TAKE FOR BP OVER 150/90 AS NEEDED    . aspirin 81 MG tablet Take 81 mg by mouth daily.      . cloNIDine (CATAPRES) 0.1 MG tablet Take by mouth.    . Cranberry 400 MG CAPS Take 1 tablet by mouth 2 (two) times daily.    . cyanocobalamin (,VITAMIN B-12,) 1000 MCG/ML injection Inject into the muscle.    . fexofenadine (ALLEGRA) 180 MG tablet Take 1 tablet by mouth as needed.    . gabapentin (NEURONTIN) 100 MG capsule Take 100 mg by mouth 3 (three) times daily.      . isosorbide mononitrate (IMDUR) 60 MG 24 hr tablet Take 60 mg by mouth daily.      Marland Kitchen losartan-hydrochlorothiazide (HYZAAR) 100-12.5 MG tablet Take by mouth.    . metoprolol tartrate (LOPRESSOR) 100 MG tablet Take by mouth.    . mirabegron ER (MYRBETRIQ) 25 MG TB24 tablet Take 1 tablet (25 mg total) daily by mouth. 30 tablet 11  . pantoprazole (PROTONIX) 40 MG tablet Take 1 tablet by mouth daily.    . predniSONE (DELTASONE) 2.5 MG tablet Take 2.5 mg by mouth daily.     Marland Kitchen  PREMARIN vaginal cream USE 1 GRAM VAGINALLY EVERY OTHER NIGHT 30 g 0  . atorvastatin (LIPITOR) 20 MG tablet Take by mouth daily.    . fluticasone (FLONASE) 50 MCG/ACT nasal spray Place 2 sprays into the nose daily.    . nitrofurantoin, macrocrystal-monohydrate, (MACROBID) 100 MG capsule Take 1 capsule (100 mg total) by mouth every 12 (twelve) hours. (Patient not taking: Reported on 01/08/2018) 14 capsule 0   No current facility-administered medications for this visit.         Physical Exam BP 132/67 (BP Location: Right Arm, Patient Position: Sitting)   Pulse 69   Resp 13   Ht 5\' 7"  (1.702 m)   Wt 147 lb (66.7 kg)   BMI 23.02 kg/m  Gen:  WD/WN, NAD Skin: incision C/D/I Neurologic: intact     Assessment/Plan: 1. Carotid stenosis, bilateral Recommend:  The patient is s/p successful left CEA  Duplex ultrasound  and CTA preoperatively shows >80% contralateral stenosis.  Continue antiplatelet therapy as prescribed Continue management of CAD, HTN and Hyperlipidemia Healthy heart diet,  encouraged exercise at least 4 times per week  Follow up in 1 month to discuss right CEA  Patient will need a vocal cord check before the second [place] 01/08/2018, 7:54 PM   This note was created with Dragon medical transcription system.  Any errors from dictation are unintentional.

## 2018-02-09 ENCOUNTER — Ambulatory Visit (INDEPENDENT_AMBULATORY_CARE_PROVIDER_SITE_OTHER): Payer: Medicare Other | Admitting: Vascular Surgery

## 2018-02-09 ENCOUNTER — Encounter (INDEPENDENT_AMBULATORY_CARE_PROVIDER_SITE_OTHER): Payer: Self-pay | Admitting: Vascular Surgery

## 2018-02-09 VITALS — BP 142/70 | HR 62 | Resp 16 | Ht 67.0 in | Wt 146.0 lb

## 2018-02-09 DIAGNOSIS — I6523 Occlusion and stenosis of bilateral carotid arteries: Secondary | ICD-10-CM

## 2018-02-09 NOTE — Progress Notes (Signed)
MRN : [id number]  Dawn Mcintyre is a 84 y.o. (1932/08/21) female who presents with chief complaint of  Chief Complaint  Patient presents with  . Follow-up    55mo discuss carotid  .  History of Present Illness:  Left carotid endarterectomy with Primary Closure on 12/24/2017.  The patient is seen for follow up evaluation of carotid stenosis status post left carotid endarterectomy on 12/24/2017.  There were no post operative problems or complications related to the surgery.  The patient denies neck or incisional pain.  The patient denies interval amaurosis fugax. There is no recent history of TIA symptoms or focal motor deficits. There is no prior documented CVA.  The patient denies headache.  The patient is taking enteric-coated aspirin 81 mg daily.  The patient has a history of coronary artery disease, no recent episodes of angina or shortness of breath. The patient denies PAD or claudication symptoms. There is a history of hyperlipidemia which is being treated with a statin.    Current Meds  Medication Sig  . ALPRAZolam (XANAX) 0.25 MG tablet Take 0.25 mg by mouth at bedtime as needed.    Marland Kitchen amLODipine (NORVASC) 2.5 MG tablet TAKE 1 TABLET (2.5 MG TOTAL) BY MOUTH ONCE DAILY. TAKE FOR BP OVER 150/90 AS NEEDED  . aspirin 81 MG tablet Take 81 mg by mouth daily.    . cloNIDine (CATAPRES) 0.1 MG tablet Take by mouth.  . Cranberry 400 MG CAPS Take 1 tablet by mouth 2 (two) times daily.  . cyanocobalamin (,VITAMIN B-12,) 1000 MCG/ML injection Inject into the muscle.  . fexofenadine (ALLEGRA) 180 MG tablet Take 1 tablet by mouth as needed.  . gabapentin (NEURONTIN) 100 MG capsule Take 100 mg by mouth 3 (three) times daily.    . isosorbide mononitrate (IMDUR) 60 MG 24 hr tablet Take 60 mg by mouth daily.    Marland Kitchen losartan-hydrochlorothiazide (HYZAAR) 100-12.5 MG tablet Take by mouth.  . metoprolol tartrate (LOPRESSOR) 100 MG tablet Take by mouth.  . mirabegron ER (MYRBETRIQ) 25 MG TB24  tablet Take 1 tablet (25 mg total) daily by mouth.  . pantoprazole (PROTONIX) 40 MG tablet Take 1 tablet by mouth daily.  . predniSONE (DELTASONE) 2.5 MG tablet Take 2.5 mg by mouth daily.   Marland Kitchen PREMARIN vaginal cream USE 1 GRAM VAGINALLY EVERY OTHER NIGHT    Past Medical History:  Diagnosis Date  . Arthritis   . Atrophic vaginitis 11/16/2014  . Cataract   . Difficult intubation   . GERD (gastroesophageal reflux disease)   . Hyperlipidemia   . Hypertension   . Osteoporosis   . Pneumonia     x 60yrs ago  . Pre-diabetes   . Ulcer     Past Surgical History:  Procedure Laterality Date  . ABDOMINAL HYSTERECTOMY  1972  . BREAST CYST EXCISION Right 1972  . BREAST CYST EXCISION Left 1972  . ENDARTERECTOMY Left 12/24/2017   Procedure: ENDARTERECTOMY CAROTID;  Surgeon: Katha Cabal, MD;  Location: [facility] ORS;  Service: Vascular;  Laterality: Left;  . EYE SURGERY    . THROAT SURGERY     x 10 yrs ago. throat mass.    Social History Social History   Tobacco Use  . Smoking status: Never Smoker  . Smokeless tobacco: Never Used  Substance Use Topics  . Alcohol use: No    Alcohol/week: 0.0 oz  . Drug use: No    Family History Family History  Problem Relation Age of Onset  .  Anuerysm Mother   . Cancer Father        prostrate cancer  . Diabetes Brother   . Hearing loss Brother   . Bladder Cancer Neg Hx   . Kidney cancer Neg Hx     Allergies  Allergen Reactions  . Amoxicillin-Pot Clavulanate Other (See Comments)  . Codeine Hives  . Hydrocodone Nausea Only  . Metronidazole Nausea Only  . Propoxyphene Nausea Only  . Latex Rash     REVIEW OF SYSTEMS (Negative unless checked)  Constitutional: [] Weight loss  [] Fever  [] Chills Cardiac: [] Chest pain   [] Chest pressure   [] Palpitations   [] Shortness of breath when laying flat   [] Shortness of breath with exertion. Vascular:  [] Pain in legs with walking   [] Pain in legs at rest  [] History of DVT   [] Phlebitis   [] Swelling  in legs   [] Varicose veins   [] Non-healing ulcers Pulmonary:   [] Uses home oxygen   [] Productive cough   [] Hemoptysis   [] Wheeze  [] COPD   [] Asthma Neurologic:  [] Dizziness   [] Seizures   [] History of stroke   [] History of TIA  [] Aphasia   [] Vissual changes   [] Weakness or numbness in arm   [] Weakness or numbness in leg Musculoskeletal:   [] Joint swelling   [] Joint pain   [] Low back pain Hematologic:  [] Easy bruising  [] Easy bleeding   [] Hypercoagulable state   [] Anemic Gastrointestinal:  [] Diarrhea   [] Vomiting  [] Gastroesophageal reflux/heartburn   [] Difficulty swallowing. Genitourinary:  [] Chronic kidney disease   [] Difficult urination  [] Frequent urination   [] Blood in urine Skin:  [] Rashes   [] Ulcers  Psychological:  [] History of anxiety   []  History of major depression.  Physical Examination  Vitals:   02/09/18 1118  BP: (!) 142/70  Pulse: 62  Resp: 16  Weight: 146 lb (66.2 kg)  Height: 5\' 7"  (1.702 m)   Body mass index is 22.87 kg/m. Gen: WD/WN, NAD Head: [place]/AT, No temporalis wasting.  Ear/Nose/Throat: Hearing grossly intact, nares w/o erythema or drainage Eyes: PER, EOMI, sclera nonicteric.  Neck: Supple, no large masses.   Pulmonary:  Good air movement, no audible wheezing bilaterally, no use of accessory muscles.  Cardiac: RRR, no JVD Vascular: left CEA incisional scar looks really good, right carotid bruit. Vessel Right Left  Radial Palpable Palpable  Gastrointestinal: Non-distended. No guarding/no peritoneal signs.  Musculoskeletal: M/S 5/5 throughout.  No deformity or atrophy.  Neurologic: CN 2-12 intact. Symmetrical.  Speech is fluent. Motor exam as listed above. Psychiatric: Judgment intact, Mood & affect appropriate for pt's clinical situation. Dermatologic: No rashes or ulcers noted.  No changes consistent with cellulitis. Lymph : No lichenification or skin changes of chronic lymphedema.  CBC Lab Results  Component Value Date   WBC 9.0 12/25/2017   HGB 10.6  (L) 12/25/2017   HCT 30.4 (L) 12/25/2017   MCV 93.7 12/25/2017   PLT 184 12/25/2017    BMET    Component Value Date/Time   NA 140 12/25/2017 0501   NA 139 09/12/2011 0250   K 3.6 12/25/2017 0501   K 3.5 09/12/2011 0250   CL 108 12/25/2017 0501   CL 102 09/12/2011 0250   CO2 26 12/25/2017 0501   CO2 23 09/12/2011 0250   GLUCOSE 141 (H) 12/25/2017 0501   GLUCOSE 162 (H) 09/12/2011 0250   BUN 10 12/25/2017 0501   BUN 20 05/30/2016 1358   BUN 23 (H) 09/12/2011 0250   CREATININE 0.74 12/25/2017 0501   CREATININE 0.94 09/12/2011  0250   CALCIUM 8.6 (L) 12/25/2017 0501   CALCIUM 9.1 09/12/2011 0250   GFRNONAA >60 12/25/2017 0501   GFRNONAA >60 09/12/2011 0250   GFRAA >60 12/25/2017 0501   GFRAA >60 09/12/2011 0250   CrCl cannot be calculated (Patient's most recent lab result is older than the maximum 21 days allowed.).  COAG Lab Results  Component Value Date   INR 1.02 12/16/2017    Radiology No results found.    Assessment/Plan 1. Carotid stenosis, bilateral Recommend:  The patient remains asymptomatic with respect to the carotid stenosis.  She has done well with her left CEA and will now move forward with surgery of the right carotid.  Patient's previous CT angiography of the carotid arteries confirms >80% bilateral ICA stenosis.  The anatomical considerations support surgery over stenting.  This was discussed in detail with the patient.  The patient has cardiac clearance from her first carotid.  I will ask Dr Tami Ribas for a vocal cord check.  The risks, benefits and alternative therapies were reviewed in detail with the patient.  All questions were answered.  The patient agrees to proceed with surgery of the right carotid artery.  Continue antiplatelet therapy as prescribed. Continue management of CAD, HTN and Hyperlipidemia. Healthy heart diet, encouraged exercise at least 4 times per week.     [name], MD  02/09/2018 11:40 AM

## 2018-02-11 ENCOUNTER — Other Ambulatory Visit: Payer: Self-pay

## 2018-02-11 ENCOUNTER — Emergency Department
Admission: EM | Admit: 2018-02-11 | Discharge: 2018-02-12 | Disposition: A | Discharge status: Home / Self Care | Payer: Medicare Other | Attending: Emergency Medicine | Admitting: Emergency Medicine

## 2018-02-11 ENCOUNTER — Encounter: Payer: Self-pay | Admitting: Emergency Medicine

## 2018-02-11 DIAGNOSIS — E1122 Type 2 diabetes mellitus with diabetic chronic kidney disease: Secondary | ICD-10-CM | POA: Insufficient documentation

## 2018-02-11 DIAGNOSIS — Z79899 Other long term (current) drug therapy: Secondary | ICD-10-CM | POA: Insufficient documentation

## 2018-02-11 DIAGNOSIS — R51 Headache: Secondary | ICD-10-CM | POA: Insufficient documentation

## 2018-02-11 DIAGNOSIS — Z7982 Long term (current) use of aspirin: Secondary | ICD-10-CM | POA: Diagnosis not present

## 2018-02-11 DIAGNOSIS — N183 Chronic kidney disease, stage 3 (moderate): Secondary | ICD-10-CM | POA: Insufficient documentation

## 2018-02-11 DIAGNOSIS — I129 Hypertensive chronic kidney disease with stage 1 through stage 4 chronic kidney disease, or unspecified chronic kidney disease: Secondary | ICD-10-CM | POA: Diagnosis not present

## 2018-02-11 DIAGNOSIS — I259 Chronic ischemic heart disease, unspecified: Secondary | ICD-10-CM | POA: Insufficient documentation

## 2018-02-11 DIAGNOSIS — I10 Essential (primary) hypertension: Secondary | ICD-10-CM

## 2018-02-11 NOTE — ED Triage Notes (Addendum)
Patient ambulatory to triage with steady gait, without difficulty or distress noted; pt reports hx HTN; BP at home was 222/95; norm takes clonidine 0.1mg  PRN with good relief but noted no change tonight; pt denies any c/o

## 2018-02-12 ENCOUNTER — Emergency Department: Payer: Medicare Other

## 2018-02-12 LAB — COMPREHENSIVE METABOLIC PANEL
ALBUMIN: 4.3 g/dL (ref 3.5–5.0)
ALT: 14 U/L (ref 0–44)
AST: 19 U/L (ref 15–41)
Alkaline Phosphatase: 77 U/L (ref 38–126)
Anion gap: 8 (ref 5–15)
BUN: 21 mg/dL (ref 8–23)
CHLORIDE: 101 mmol/L (ref 98–111)
CO2: 31 mmol/L (ref 22–32)
CREATININE: 0.91 mg/dL (ref 0.44–1.00)
Calcium: 10.7 mg/dL — ABNORMAL HIGH (ref 8.9–10.3)
GFR calc Af Amer: >60 mL/min (ref >60)
GFR, EST NON AFRICAN AMERICAN: 57 mL/min — AB (ref >60)
GLUCOSE: 123 mg/dL — AB (ref 70–99)
Potassium: 3.6 mmol/L (ref 3.5–5.1)
Sodium: 140 mmol/L (ref 135–145)
Total Bilirubin: 0.7 mg/dL (ref 0.3–1.2)
Total Protein: 7.5 g/dL (ref 6.5–8.1)

## 2018-02-12 LAB — CBC
HCT: 38.7 % (ref 35.0–47.0)
Hemoglobin: 13.1 g/dL (ref 12.0–16.0)
MCH: 31.3 pg (ref 26.0–34.0)
MCHC: 33.8 g/dL (ref 32.0–36.0)
MCV: 92.5 fL (ref 80.0–100.0)
PLATELETS: 255 10*3/uL (ref 150–440)
RBC: 4.18 MIL/uL (ref 3.80–5.20)
RDW: 13.3 % (ref 11.5–14.5)
WBC: 8.7 10*3/uL (ref 3.6–11.0)

## 2018-02-12 MED ORDER — IOPAMIDOL (ISOVUE-370) INJECTION 76%
100.0000 mL | Freq: Once | INTRAVENOUS | Status: AC | PRN
  Administered 2018-02-12: 100 mL via INTRAVENOUS

## 2018-02-12 MED ORDER — CLONIDINE HCL 0.1 MG PO TABS
0.1000 mg | ORAL_TABLET | Freq: Once | ORAL | Status: AC
  Administered 2018-02-12: 0.1 mg via ORAL
  Filled 2018-02-12: qty 1

## 2018-02-12 NOTE — ED Notes (Addendum)
Patient reports High BP, denies chest pain.

## 2018-02-12 NOTE — ED Provider Notes (Signed)
[facility] Emergency Department Provider Note   First MD Initiated Contact with Patient 02/12/18 0008     (approximate)  I have reviewed the triage vital signs and the nursing notes.   HISTORY  Chief Complaint Hypertension   HPI Dawn Mcintyre is a 84 y.o. female below list of chronic medical conditions including hypertension presents to the emergency department with noted hypertension today at 3 PM at which point the patient's blood pressure was 222/95.  Patient states that she is prescribed clonidine 0.1 mg as needed which she took at 3 PM without any relief.  Patient does admit to left forehead/facial discomfort.  Patient denies any visual changes no weakness numbness gait instability.  Of note patient recently had a left carotid endarterectomy performed with plans for a right carotid endarterectomy in August.  Patient states that her mother died of a cerebral aneurysm in her 34s.   Past Medical History:  Diagnosis Date  . Arthritis   . Atrophic vaginitis 11/16/2014  . Cataract   . Difficult intubation   . GERD (gastroesophageal reflux disease)   . Hyperlipidemia   . Hypertension   . Osteoporosis   . Pneumonia     x 60yrs ago  . Pre-diabetes   . Ulcer     Patient Active Problem List   Diagnosis Date Noted  . Carotid stenosis, bilateral 12/24/2017  . Syncope and collapse 11/15/2017  . CAD (coronary artery disease) 11/15/2017  . Leg pain 10/26/2017  . Carotid stenosis 07/01/2016  . Hyperlipidemia 07/01/2016  . Peripheral sensory neuropathy ([place]) 03/25/2016  . Type 2 diabetes mellitus with stage 3 chronic kidney disease, without long-term current use of insulin ([place]) 03/25/2016  . Anxiety 11/24/2015  . Sensory peripheral neuropathy ([place]) 10/30/2015  . Influenza 10/15/2015  . CKD (chronic kidney disease) stage 3, GFR 30-59 ml/min (HCC) 07/19/2015  . Osteopenia 07/19/2015  . Chronic insomnia 01/25/2015  . Diet-controlled type 2 diabetes  mellitus ([place]) 01/25/2015  . Gastroesophageal reflux disease without esophagitis 01/25/2015  . Mixed stress and urge urinary incontinence 01/25/2015  . Atrophic vaginitis 11/16/2014  . Vitamin B12 deficiency 10/26/2014  . Abdominal pain, chronic, epigastric 06/15/2014  . Essential hypertension 04/28/2014  . Sciatica of left side 04/07/2014  . HTN (hypertension) 01/28/2014  . Type 2 diabetes mellitus ([place]) 01/28/2014    Past Surgical History:  Procedure Laterality Date  . ABDOMINAL HYSTERECTOMY  1972  . BREAST CYST EXCISION Right 1972  . BREAST CYST EXCISION Left 1972  . ENDARTERECTOMY Left 12/24/2017   Procedure: ENDARTERECTOMY CAROTID;  Surgeon: Katha Cabal, MD;  Location: [facility] ORS;  Service: Vascular;  Laterality: Left;  . EYE SURGERY    . THROAT SURGERY     x 10 yrs ago. throat mass.    Prior to Admission medications   Medication Sig Start Date End Date Taking? Authorizing Provider  ALPRAZolam (XANAX) 0.25 MG tablet Take 0.25 mg by mouth at bedtime as needed.      [provider]  amLODipine (NORVASC) 2.5 MG tablet TAKE 1 TABLET (2.5 MG TOTAL) BY MOUTH ONCE DAILY. TAKE FOR BP OVER 150/90 AS NEEDED 04/11/16   [provider]  aspirin 81 MG tablet Take 81 mg by mouth daily.      [provider]  atorvastatin (LIPITOR) 20 MG tablet Take by mouth daily. 06/25/16 12/16/17  [provider]  cloNIDine (CATAPRES) 0.1 MG tablet Take by mouth. 06/18/17 06/18/18  [provider]  Cranberry 400 MG CAPS  Take 1 tablet by mouth 2 (two) times daily.    [provider]  cyanocobalamin (,VITAMIN B-12,) 1000 MCG/ML injection Inject into the muscle. 03/12/17 03/07/18  [provider]  fexofenadine (ALLEGRA) 180 MG tablet Take 1 tablet by mouth as needed.    [provider]  fluticasone (FLONASE) 50 MCG/ACT nasal spray Place 2 sprays into the nose daily. 02/19/15 12/16/17  [provider]  gabapentin (NEURONTIN) 100 MG  capsule Take 100 mg by mouth 3 (three) times daily.      [provider]  isosorbide mononitrate (IMDUR) 60 MG 24 hr tablet Take 60 mg by mouth daily.      [provider]  losartan-hydrochlorothiazide (HYZAAR) 100-12.5 MG tablet Take by mouth. 05/19/17 05/19/18  [provider]  metoprolol tartrate (LOPRESSOR) 100 MG tablet Take by mouth. 12/10/16   [provider]  mirabegron ER (MYRBETRIQ) 25 MG TB24 tablet Take 1 tablet (25 mg total) daily by mouth. 06/19/17   McGowan, Larene Beach A, PA-C  nitrofurantoin, macrocrystal-monohydrate, (MACROBID) 100 MG capsule Take 1 capsule (100 mg total) by mouth every 12 (twelve) hours. Patient not taking: Reported on 01/08/2018 11/17/17   Hollice Espy, MD  pantoprazole (PROTONIX) 40 MG tablet Take 1 tablet by mouth daily. 04/28/15   [provider]  predniSONE (DELTASONE) 2.5 MG tablet Take 2.5 mg by mouth daily.     [provider]  PREMARIN vaginal cream USE 1 GRAM VAGINALLY EVERY OTHER NIGHT 01/01/18   Tamala Julian, [place], CNM    Allergies Amoxicillin-pot clavulanate; Codeine; Hydrocodone; Metronidazole; Propoxyphene; and Latex  Family History  Problem Relation Age of Onset  . Anuerysm Mother   . Cancer Father        prostrate cancer  . Diabetes Brother   . Hearing loss Brother   . Bladder Cancer Neg Hx   . Kidney cancer Neg Hx     Social History Social History   Tobacco Use  . Smoking status: Never Smoker  . Smokeless tobacco: Never Used  Substance Use Topics  . Alcohol use: No    Alcohol/week: 0.0 oz  . Drug use: No    Review of Systems Constitutional: No fever/chills Eyes: No visual changes. ENT: No sore throat. Cardiovascular: Denies chest pain. Respiratory: Denies shortness of breath. Gastrointestinal: No abdominal pain.  No nausea, no vomiting.  No diarrhea.  No constipation. Genitourinary: Negative for dysuria. Musculoskeletal: Negative for neck pain.  Negative for back  pain. Integumentary: Negative for rash. Neurological: Positive for headaches, negative for focal weakness or numbness.   ____________________________________________   PHYSICAL EXAM:  VITAL SIGNS: ED Triage Vitals  Enc Vitals Group     BP 02/11/18 2224 (!) 201/66     Pulse Rate 02/11/18 2224 65     Resp 02/11/18 2224 18     Temp 02/11/18 2224 98 F (36.7 C)     Temp Source 02/11/18 2224 Oral     SpO2 02/11/18 2224 97 %     Weight --      Height --      Head Circumference --      Peak Flow --      Pain Score 02/11/18 2223 0     Pain Loc --      Pain Edu? --      Excl. in [place]? --     Constitutional: Alert and oriented. Well appearing and in no acute distress. Eyes: Conjunctivae are normal.  Head: Atraumatic. Mouth/Throat: Mucous membranes are moist.  Oropharynx non-erythematous.  Neck: No stridor.  Cardiovascular: Normal rate, regular rhythm. Good peripheral circulation. Grossly normal heart sounds. Respiratory: Normal respiratory effort.  No retractions. Lungs CTAB. Gastrointestinal: Soft and nontender. No distention.  Musculoskeletal: No lower extremity tenderness nor edema. No gross deformities of extremities. Neurologic:  Normal speech and language. No gross focal neurologic deficits are appreciated.  Skin:  Skin is warm, dry and intact. No rash noted. Psychiatric: Mood and affect are normal. Speech and behavior are normal.  ____________________________________________   LABS (all labs ordered are listed, but only abnormal results are displayed)  Labs Reviewed  COMPREHENSIVE METABOLIC PANEL - Abnormal; Notable for the following components:      Result Value   Glucose, Bld 123 (*)    Calcium 10.7 (*)    GFR calc non Af Amer 57 (*)    All other components within normal limits  CBC   ________________  RADIOLOGY I, [place] Jourden Gilson, personally viewed and evaluated these images (plain radiographs) as part of my medical decision making, as well as reviewing the  written report by the radiologist.  ED MD interpretation: No acute intracranial findings noted on CT angios the head or CT angio of  the neck.  Official radiology report(s): Ct Angio Head W Or Wo Contrast  Result Date: 02/12/2018 CLINICAL DATA:  Assess for vascular abnormality. History of hypertension, hyperlipidemia. Status post LEFT carotid endarterectomy. EXAM: CT ANGIOGRAPHY HEAD AND NECK TECHNIQUE: Multidetector CT imaging of the head and neck was performed using the standard protocol during bolus administration of intravenous contrast. Multiplanar CT image reconstructions and MIPs were obtained to evaluate the vascular anatomy. Carotid stenosis measurements (when applicable) are obtained utilizing NASCET criteria, using the distal internal carotid diameter as the denominator. CONTRAST:  187mL ISOVUE-370 IOPAMIDOL (ISOVUE-370) INJECTION 76% COMPARISON:  CT neck October 24, 2017 and CT HEAD September 15, 2009. FINDINGS: CT HEAD FINDINGS BRAIN: No intraparenchymal hemorrhage, mass effect nor midline shift. The ventricles and sulci are normal for age. Patchy supratentorial white matter hypodensities less than expected for patient's age, though non-specific are most compatible with chronic small vessel ischemic disease. No acute large vascular territory infarcts. No abnormal extra-axial fluid collections. Basal cisterns are patent. VASCULAR: Moderate calcific atherosclerosis of the carotid siphons. SKULL: No skull fracture. Severe bilateral temporomandibular osteoarthrosis. No significant scalp soft tissue swelling. SINUSES/ORBITS: The mastoid air-cells and included paranasal sinuses are well-aerated.The included ocular globes and orbital contents are non-suspicious. Status post bilateral ocular lens implants. OTHER: None. CTA NECK FINDINGS: AORTIC ARCH: Normal appearance of the thoracic arch, mild calcific atherosclerosis. The origins of the innominate, left Common carotid artery and subclavian artery are  widely patent. RIGHT CAROTID SYSTEM: Common carotid artery is patent. Eccentric calcific atherosclerosis carotid bifurcation proximal internal carotid artery without hemodynamically significant stenosis by NASCET criteria. Focally beaded appearance RIGHT mid cervical internal carotid artery, this could reflect fibromuscular dysplasia or atherosclerosis. LEFT CAROTID SYSTEM: Common carotid artery is patent, mild intimal thickening calcific atherosclerosis. Normal appearance of the carotid bifurcation without hemodynamically significant stenosis by NASCET criteria. Status post LEFT carotid endarterectomy. Normal appearance of the internal carotid artery. VERTEBRAL ARTERIES:LEFT vertebral artery arises directly from the aortic arch, normal variant. RIGHT vertebral artery is dominant. Mild extrinsic compression of RIGHT vertebral artery due to degenerative cervical spine. Moderate stenosis RIGHT vertebral artery origin due to atherosclerosis. SKELETON: No acute osseous process though bone windows have not been submitted. Torus mandibularis. Degenerative changes cervical spine resulting in severe RIGHT C4-5, moderate to severe bilateral C5-6 and C6-7 neural  foraminal narrowing. Moderate canal stenosis C5-6. OTHER NECK: Soft tissues of the neck are nonacute though, not tailored for evaluation. Stable 7 x 14 mm cyst LEFT vallecula. LEFT neck scarring laterally. UPPER CHEST: Included lung apices are clear. No superior mediastinal lymphadenopathy. RIGHT hilar lymph nodes on axial sequences resulting in spurious appearance of pulmonary arterial filling defect. CTA HEAD FINDINGS: ANTERIOR CIRCULATION: Patent cervical internal carotid arteries, petrous, cavernous and supra clinoid internal carotid arteries. Mild RIGHT, moderate LEFT stenosis supraclinoid ICA. Patent anterior communicating artery. Patent anterior and middle cerebral arteries, mild luminal irregularity compatible with atherosclerosis. No large vessel occlusion,  significant stenosis, contrast extravasation or aneurysm. POSTERIOR CIRCULATION: Patent vertebral arteries, vertebrobasilar junction and basilar artery, as well as main branch vessels. Patent posterior cerebral arteries, mild luminal irregularity compatible with atherosclerosis. Bilateral posterior communicating arteries present. No large vessel occlusion, significant stenosis, contrast extravasation or aneurysm. VENOUS SINUSES: Major dural venous sinuses are patent though not tailored for evaluation on this angiographic examination. ANATOMIC VARIANTS: None. DELAYED PHASE: No abnormal intracranial enhancement. MIP images reviewed. IMPRESSION: CT HEAD: 1. Negative CT HEAD with without contrast for age. CTA NECK: 1. Less than 50% stenosis RIGHT ICA. Status post LEFT carotid endarterectomy without stenosis. 2. Patent vertebral arteries. 3. Moderate canal stenosis C5-6. Severe RIGHT C4-5 neural foraminal narrowing. CTA HEAD: 1.  No emergent large vessel occlusion. 2. Moderate stenosis LEFT supraclinoid ICA. Aortic Atherosclerosis (ICD10-I70.0). Electronically Signed   By: Elon Alas M.D.   On: 02/12/2018 01:54   Ct Angio Neck W And/or Wo Contrast  Result Date: 02/12/2018 CLINICAL DATA:  Assess for vascular abnormality. History of hypertension, hyperlipidemia. Status post LEFT carotid endarterectomy. EXAM: CT ANGIOGRAPHY HEAD AND NECK TECHNIQUE: Multidetector CT imaging of the head and neck was performed using the standard protocol during bolus administration of intravenous contrast. Multiplanar CT image reconstructions and MIPs were obtained to evaluate the vascular anatomy. Carotid stenosis measurements (when applicable) are obtained utilizing NASCET criteria, using the distal internal carotid diameter as the denominator. CONTRAST:  15mL ISOVUE-370 IOPAMIDOL (ISOVUE-370) INJECTION 76% COMPARISON:  CT neck October 24, 2017 and CT HEAD September 15, 2009. FINDINGS: CT HEAD FINDINGS BRAIN: No intraparenchymal  hemorrhage, mass effect nor midline shift. The ventricles and sulci are normal for age. Patchy supratentorial white matter hypodensities less than expected for patient's age, though non-specific are most compatible with chronic small vessel ischemic disease. No acute large vascular territory infarcts. No abnormal extra-axial fluid collections. Basal cisterns are patent. VASCULAR: Moderate calcific atherosclerosis of the carotid siphons. SKULL: No skull fracture. Severe bilateral temporomandibular osteoarthrosis. No significant scalp soft tissue swelling. SINUSES/ORBITS: The mastoid air-cells and included paranasal sinuses are well-aerated.The included ocular globes and orbital contents are non-suspicious. Status post bilateral ocular lens implants. OTHER: None. CTA NECK FINDINGS: AORTIC ARCH: Normal appearance of the thoracic arch, mild calcific atherosclerosis. The origins of the innominate, left Common carotid artery and subclavian artery are widely patent. RIGHT CAROTID SYSTEM: Common carotid artery is patent. Eccentric calcific atherosclerosis carotid bifurcation proximal internal carotid artery without hemodynamically significant stenosis by NASCET criteria. Focally beaded appearance RIGHT mid cervical internal carotid artery, this could reflect fibromuscular dysplasia or atherosclerosis. LEFT CAROTID SYSTEM: Common carotid artery is patent, mild intimal thickening calcific atherosclerosis. Normal appearance of the carotid bifurcation without hemodynamically significant stenosis by NASCET criteria. Status post LEFT carotid endarterectomy. Normal appearance of the internal carotid artery. VERTEBRAL ARTERIES:LEFT vertebral artery arises directly from the aortic arch, normal variant. RIGHT vertebral artery is dominant. Mild extrinsic compression of  RIGHT vertebral artery due to degenerative cervical spine. Moderate stenosis RIGHT vertebral artery origin due to atherosclerosis. SKELETON: No acute osseous process  though bone windows have not been submitted. Torus mandibularis. Degenerative changes cervical spine resulting in severe RIGHT C4-5, moderate to severe bilateral C5-6 and C6-7 neural foraminal narrowing. Moderate canal stenosis C5-6. OTHER NECK: Soft tissues of the neck are nonacute though, not tailored for evaluation. Stable 7 x 14 mm cyst LEFT vallecula. LEFT neck scarring laterally. UPPER CHEST: Included lung apices are clear. No superior mediastinal lymphadenopathy. RIGHT hilar lymph nodes on axial sequences resulting in spurious appearance of pulmonary arterial filling defect. CTA HEAD FINDINGS: ANTERIOR CIRCULATION: Patent cervical internal carotid arteries, petrous, cavernous and supra clinoid internal carotid arteries. Mild RIGHT, moderate LEFT stenosis supraclinoid ICA. Patent anterior communicating artery. Patent anterior and middle cerebral arteries, mild luminal irregularity compatible with atherosclerosis. No large vessel occlusion, significant stenosis, contrast extravasation or aneurysm. POSTERIOR CIRCULATION: Patent vertebral arteries, vertebrobasilar junction and basilar artery, as well as main branch vessels. Patent posterior cerebral arteries, mild luminal irregularity compatible with atherosclerosis. Bilateral posterior communicating arteries present. No large vessel occlusion, significant stenosis, contrast extravasation or aneurysm. VENOUS SINUSES: Major dural venous sinuses are patent though not tailored for evaluation on this angiographic examination. ANATOMIC VARIANTS: None. DELAYED PHASE: No abnormal intracranial enhancement. MIP images reviewed. IMPRESSION: CT HEAD: 1. Negative CT HEAD with without contrast for age. CTA NECK: 1. Less than 50% stenosis RIGHT ICA. Status post LEFT carotid endarterectomy without stenosis. 2. Patent vertebral arteries. 3. Moderate canal stenosis C5-6. Severe RIGHT C4-5 neural foraminal narrowing. CTA HEAD: 1.  No emergent large vessel occlusion. 2. Moderate  stenosis LEFT supraclinoid ICA. Aortic Atherosclerosis (ICD10-I70.0). Electronically Signed   By: Elon Alas M.D.   On: 02/12/2018 01:54     Procedures   ____________________________________________   INITIAL IMPRESSION / ASSESSMENT AND PLAN / ED COURSE  As part of my medical decision making, I reviewed the following data within the [place] NUMBER   84 year old female presenting with above-stated history and physical exam secondary to hypertension and headache.  Consider possibly a posterior circulation pathology CT angiogram of the neck and head was performed which was negative for any acute findings no stenosis or aneurysms noted.  Patient was given 0.1 mg of clonidine with improvement of blood pressure.  Current blood pressure 147/64. ____________________________________________  FINAL CLINICAL IMPRESSION(S) / ED DIAGNOSES  Final diagnoses:  Essential hypertension     MEDICATIONS GIVEN DURING THIS VISIT:  Medications  cloNIDine (CATAPRES) tablet 0.1 mg (0.1 mg Oral Given 02/12/18 0034)  iopamidol (ISOVUE-370) 76 % injection 100 mL (100 mLs Intravenous Contrast Given 02/12/18 0118)     ED Discharge Orders    None       Note:  This document was prepared using Dragon voice recognition software and may include unintentional dictation errors.    Gregor Hams, MD 02/12/18 [id number]

## 2018-02-23 ENCOUNTER — Encounter (INDEPENDENT_AMBULATORY_CARE_PROVIDER_SITE_OTHER): Payer: Self-pay

## 2018-03-02 ENCOUNTER — Other Ambulatory Visit (INDEPENDENT_AMBULATORY_CARE_PROVIDER_SITE_OTHER): Payer: Self-pay | Admitting: Vascular Surgery

## 2018-03-03 ENCOUNTER — Telehealth: Payer: Self-pay | Admitting: Urology

## 2018-03-03 NOTE — Telephone Encounter (Signed)
Patient called the office today with complaint of UTI symptoms (frequent urination, painful urination, just feeling bad).  She could not come to the office today.  Scheduled a nurse visit for tomorrow, 03/05/18 at 10:00am.

## 2018-03-04 ENCOUNTER — Other Ambulatory Visit: Payer: Self-pay

## 2018-03-04 ENCOUNTER — Ambulatory Visit: Payer: Medicare Other

## 2018-03-04 VITALS — BP 104/59 | HR 62 | Resp 16 | Ht 67.0 in | Wt 144.7 lb

## 2018-03-04 DIAGNOSIS — N390 Urinary tract infection, site not specified: Secondary | ICD-10-CM

## 2018-03-04 LAB — URINALYSIS, COMPLETE
BILIRUBIN UA: NEGATIVE
Glucose, UA: NEGATIVE
KETONES UA: NEGATIVE
Nitrite, UA: NEGATIVE
Protein, UA: NEGATIVE
RBC, UA: NEGATIVE
SPEC GRAV UA: 1.015 (ref 1.005–1.030)
Urobilinogen, Ur: 0.2 mg/dL (ref 0.2–1.0)
pH, UA: 6 (ref 5.0–7.5)

## 2018-03-04 LAB — MICROSCOPIC EXAMINATION
Bacteria, UA: NONE SEEN
RBC MICROSCOPIC, UA: NONE SEEN /HPF (ref 0–2)

## 2018-03-04 NOTE — Progress Notes (Signed)
Dawn Mcintyre is present in the office today for a nurse visit. She is complaining of frequent urination and urgency. She provided a urine sample for analysis and culture. Will call pt with results.

## 2018-03-05 ENCOUNTER — Encounter
Admission: RE | Admit: 2018-03-05 | Discharge: 2018-03-05 | Disposition: A | Discharge status: Home / Self Care | Payer: Medicare Other | Source: Ambulatory Visit | Attending: Vascular Surgery | Admitting: Vascular Surgery

## 2018-03-05 ENCOUNTER — Other Ambulatory Visit: Payer: Self-pay

## 2018-03-05 DIAGNOSIS — Z01812 Encounter for preprocedural laboratory examination: Secondary | ICD-10-CM | POA: Insufficient documentation

## 2018-03-05 DIAGNOSIS — Z01818 Encounter for other preprocedural examination: Secondary | ICD-10-CM | POA: Diagnosis present

## 2018-03-05 DIAGNOSIS — R001 Bradycardia, unspecified: Secondary | ICD-10-CM | POA: Insufficient documentation

## 2018-03-05 DIAGNOSIS — I517 Cardiomegaly: Secondary | ICD-10-CM | POA: Insufficient documentation

## 2018-03-05 DIAGNOSIS — Z01810 Encounter for preprocedural cardiovascular examination: Secondary | ICD-10-CM

## 2018-03-05 DIAGNOSIS — Z0183 Encounter for blood typing: Secondary | ICD-10-CM | POA: Insufficient documentation

## 2018-03-05 DIAGNOSIS — R9431 Abnormal electrocardiogram [ECG] [EKG]: Secondary | ICD-10-CM | POA: Diagnosis not present

## 2018-03-05 HISTORY — DX: Other allergic rhinitis: J30.89

## 2018-03-05 HISTORY — DX: Polyneuropathy, unspecified: G62.9

## 2018-03-05 HISTORY — DX: Unspecified urinary incontinence: R32

## 2018-03-05 HISTORY — DX: Occlusion and stenosis of unspecified carotid artery: I65.29

## 2018-03-05 HISTORY — DX: Arteritis, unspecified: I77.6

## 2018-03-05 LAB — TYPE AND SCREEN
ABO/RH(D): O POS
ANTIBODY SCREEN: NEGATIVE

## 2018-03-05 LAB — CBC WITH DIFFERENTIAL/PLATELET
Basophils Absolute: 0.1 10*3/uL (ref 0–0.1)
Basophils Relative: 1 %
EOS ABS: 0.1 10*3/uL (ref 0–0.7)
EOS PCT: 2 %
HCT: 37.1 % (ref 35.0–47.0)
Hemoglobin: 12.4 g/dL (ref 12.0–16.0)
LYMPHS ABS: 1.3 10*3/uL (ref 1.0–3.6)
LYMPHS PCT: 17 %
MCH: 31 pg (ref 26.0–34.0)
MCHC: 33.5 g/dL (ref 32.0–36.0)
MCV: 92.5 fL (ref 80.0–100.0)
MONOS PCT: 11 %
Monocytes Absolute: 0.8 10*3/uL (ref 0.2–0.9)
Neutro Abs: 5.3 10*3/uL (ref 1.4–6.5)
Neutrophils Relative %: 69 %
PLATELETS: 237 10*3/uL (ref 150–440)
RBC: 4.01 MIL/uL (ref 3.80–5.20)
RDW: 13.4 % (ref 11.5–14.5)
WBC: 7.6 10*3/uL (ref 3.6–11.0)

## 2018-03-05 LAB — PROTIME-INR
INR: 0.99
Prothrombin Time: 13 seconds (ref 11.4–15.2)

## 2018-03-05 LAB — BASIC METABOLIC PANEL
Anion gap: 6 (ref 5–15)
BUN: 21 mg/dL (ref 8–23)
CHLORIDE: 102 mmol/L (ref 98–111)
CO2: 31 mmol/L (ref 22–32)
CREATININE: 0.98 mg/dL (ref 0.44–1.00)
Calcium: 10.1 mg/dL (ref 8.9–10.3)
GFR calc Af Amer: >60 mL/min (ref >60)
GFR calc non Af Amer: 52 mL/min — ABNORMAL LOW (ref >60)
GLUCOSE: 125 mg/dL — AB (ref 70–99)
POTASSIUM: 4 mmol/L (ref 3.5–5.1)
SODIUM: 139 mmol/L (ref 135–145)

## 2018-03-05 LAB — APTT: aPTT: 33 seconds (ref 24–36)

## 2018-03-05 MED ORDER — CHLORHEXIDINE GLUCONATE CLOTH 2 % EX PADS
6.0000 | MEDICATED_PAD | Freq: Once | CUTANEOUS | Status: DC
  Filled 2018-03-05: qty 6

## 2018-03-05 NOTE — Patient Instructions (Signed)
Your procedure is scheduled on: 03/13/18 Fri Report to Same Day Surgery 2nd floor medical mall [facility] Entrance-take elevator on left to 2nd floor.  Check in with surgery information desk.) To find out your arrival time please call [phone] between 1PM - 3PM on 03/12/18 Thurs  Remember: Instructions that are not followed completely may result in serious medical risk, up to and including death, or upon the discretion of your surgeon and anesthesiologist your surgery may need to be rescheduled.    _x___ 1. Do not eat food after midnight the night before your procedure. You may drink clear liquids up to 2 hours before you are scheduled to arrive at the hospital for your procedure.  Do not drink clear liquids within 2 hours of your scheduled arrival to the hospital.  Clear liquids include  --Water or Apple juice without pulp  --Clear carbohydrate beverage such as ClearFast or Gatorade  --Black Coffee or Clear Tea (No milk, no creamers, do not add anything to                  the coffee or Tea Type 1 and type 2 diabetics should only drink water.   ____Ensure clear carbohydrate drink on the way to the hospital for bariatric patients  ____Ensure clear carbohydrate drink 3 hours before surgery for Dr Dwyane Luo patients if physician instructed.   No gum chewing or hard candies.     __x__ 2. No Alcohol for 24 hours before or after surgery.   __x__3. No Smoking or e-cigarettes for 24 prior to surgery.  Do not use any chewable tobacco products for at least 6 hour prior to surgery   ____  4. Bring all medications with you on the day of surgery if instructed.    __x__ 5. Notify your doctor if there is any change in your medical condition     (cold, fever, infections).    x___6. On the morning of surgery brush your teeth with toothpaste and water.  You may rinse your mouth with mouth wash if you wish.  Do not swallow any toothpaste or mouthwash.   Do not wear jewelry, make-up, hairpins,  clips or nail polish.  Do not wear lotions, powders, or perfumes. You may wear deodorant.  Do not shave 48 hours prior to surgery. Men may shave face and neck.  Do not bring valuables to the hospital.    [facility] is not responsible for any belongings or valuables.               Contacts, dentures or bridgework may not be worn into surgery.  Leave your suitcase in the car. After surgery it may be brought to your room.  For patients admitted to the hospital, discharge time is determined by your                       treatment team.  _  Patients discharged the day of surgery will not be allowed to drive home.  You will need someone to drive you home and stay with you the night of your procedure.    Please read over the following fact sheets that you were given:   [facility] Preparing for Surgery and or MRSA Information   _x___ Take anti-hypertensive listed below, cardiac, seizure, asthma,     anti-reflux and psychiatric medicines. These include:  1. felodipine (PLENDIL) 2.5 MG 24 hr tablet  2. isosorbide mononitrate (IMDUR) 60 MG 24 hr tablet  3.metoprolol tartrate (LOPRESSOR) 100 MG tablet  4.pantoprazole (PROTONIX) 40 MG tablet  5.predniSONE (DELTASONE) 2.5 MG tablet  6.  ____Fleets enema or Magnesium Citrate as directed.   _x___ Use CHG Soap or sage wipes as directed on instruction sheet   ____ Use inhalers on the day of surgery and bring to hospital day of surgery  ____ Stop Metformin and Janumet 2 days prior to surgery.    ____ Take 1/2 of usual insulin dose the night before surgery and none on the morning     surgery.   _x___ Follow recommendations from Cardiologist, Pulmonologist or PCP regarding          stopping Aspirin, Coumadin, Plavix ,Eliquis, Effient, or Pradaxa, and Pletal.  X____Stop Anti-inflammatories such as Advil, Aleve, Ibuprofen, Motrin, Naproxen, Naprosyn, Goodies powders or aspirin products. OK to take Tylenol and                           Celebrex.   _x___ Stop supplements until after surgery.  But may continue Vitamin D, Vitamin B,       and multivitamin.   ____ Bring C-Pap to the hospital.

## 2018-03-05 NOTE — Pre-Procedure Instructions (Signed)
Cardiac clearance by Dr Clayborn Bigness on chart.

## 2018-03-06 LAB — SURGICAL PCR SCREEN
MRSA, PCR: POSITIVE — AB
STAPHYLOCOCCUS AUREUS: POSITIVE — AB

## 2018-03-06 NOTE — Pre-Procedure Instructions (Signed)
MRSA +/ SA + PCR results faxed to Dr. Nino Parsley office.

## 2018-03-07 LAB — CULTURE, URINE COMPREHENSIVE

## 2018-03-12 MED ORDER — VANCOMYCIN HCL IN DEXTROSE 1-5 GM/200ML-% IV SOLN
1000.0000 mg | INTRAVENOUS | Status: AC
  Administered 2018-03-13: 1000 mg via INTRAVENOUS

## 2018-03-13 ENCOUNTER — Encounter: Payer: Self-pay | Admitting: *Deleted

## 2018-03-13 ENCOUNTER — Other Ambulatory Visit: Payer: Self-pay

## 2018-03-13 ENCOUNTER — Inpatient Hospital Stay
Admission: RE | Admit: 2018-03-13 | Discharge: 2018-03-14 | DRG: 039 | Disposition: A | Discharge status: Home / Self Care | Payer: Medicare Other | Source: Ambulatory Visit | Attending: Vascular Surgery | Admitting: Vascular Surgery

## 2018-03-13 ENCOUNTER — Inpatient Hospital Stay: Payer: Medicare Other | Admitting: Anesthesiology

## 2018-03-13 ENCOUNTER — Encounter
Admission: RE | Disposition: A | Discharge status: Home / Self Care | Payer: Self-pay | Source: Ambulatory Visit | Attending: Vascular Surgery

## 2018-03-13 DIAGNOSIS — E785 Hyperlipidemia, unspecified: Secondary | ICD-10-CM | POA: Diagnosis present

## 2018-03-13 DIAGNOSIS — Z7951 Long term (current) use of inhaled steroids: Secondary | ICD-10-CM | POA: Diagnosis not present

## 2018-03-13 DIAGNOSIS — Z833 Family history of diabetes mellitus: Secondary | ICD-10-CM

## 2018-03-13 DIAGNOSIS — I2510 Atherosclerotic heart disease of native coronary artery without angina pectoris: Secondary | ICD-10-CM | POA: Diagnosis present

## 2018-03-13 DIAGNOSIS — I6521 Occlusion and stenosis of right carotid artery: Secondary | ICD-10-CM

## 2018-03-13 DIAGNOSIS — M810 Age-related osteoporosis without current pathological fracture: Secondary | ICD-10-CM | POA: Diagnosis present

## 2018-03-13 DIAGNOSIS — Z885 Allergy status to narcotic agent status: Secondary | ICD-10-CM | POA: Diagnosis not present

## 2018-03-13 DIAGNOSIS — J302 Other seasonal allergic rhinitis: Secondary | ICD-10-CM | POA: Diagnosis present

## 2018-03-13 DIAGNOSIS — Z888 Allergy status to other drugs, medicaments and biological substances status: Secondary | ICD-10-CM | POA: Diagnosis not present

## 2018-03-13 DIAGNOSIS — I779 Disorder of arteries and arterioles, unspecified: Secondary | ICD-10-CM | POA: Diagnosis present

## 2018-03-13 DIAGNOSIS — I10 Essential (primary) hypertension: Secondary | ICD-10-CM | POA: Diagnosis present

## 2018-03-13 DIAGNOSIS — R001 Bradycardia, unspecified: Secondary | ICD-10-CM | POA: Diagnosis not present

## 2018-03-13 DIAGNOSIS — Z7982 Long term (current) use of aspirin: Secondary | ICD-10-CM | POA: Diagnosis not present

## 2018-03-13 DIAGNOSIS — Z7952 Long term (current) use of systemic steroids: Secondary | ICD-10-CM | POA: Diagnosis not present

## 2018-03-13 DIAGNOSIS — K219 Gastro-esophageal reflux disease without esophagitis: Secondary | ICD-10-CM | POA: Diagnosis present

## 2018-03-13 DIAGNOSIS — Z881 Allergy status to other antibiotic agents status: Secondary | ICD-10-CM

## 2018-03-13 DIAGNOSIS — Z90710 Acquired absence of both cervix and uterus: Secondary | ICD-10-CM | POA: Diagnosis not present

## 2018-03-13 DIAGNOSIS — Z79899 Other long term (current) drug therapy: Secondary | ICD-10-CM

## 2018-03-13 DIAGNOSIS — Z91040 Latex allergy status: Secondary | ICD-10-CM

## 2018-03-13 DIAGNOSIS — G629 Polyneuropathy, unspecified: Secondary | ICD-10-CM | POA: Diagnosis present

## 2018-03-13 DIAGNOSIS — E1151 Type 2 diabetes mellitus with diabetic peripheral angiopathy without gangrene: Secondary | ICD-10-CM | POA: Diagnosis not present

## 2018-03-13 HISTORY — PX: ENDARTERECTOMY: SHX5162

## 2018-03-13 LAB — GLUCOSE, CAPILLARY
Glucose-Capillary: 159 mg/dL — ABNORMAL HIGH (ref 70–99)
Glucose-Capillary: 140 mg/dL — ABNORMAL HIGH (ref 70–99)

## 2018-03-13 SURGERY — ENDARTERECTOMY, CAROTID
Anesthesia: General | Laterality: Right | Wound class: Clean

## 2018-03-13 MED ORDER — HEPARIN SODIUM (PORCINE) 5000 UNIT/ML IJ SOLN
INTRAMUSCULAR | Status: AC
  Filled 2018-03-13: qty 1

## 2018-03-13 MED ORDER — GUAIFENESIN-DM 100-10 MG/5ML PO SYRP
15.0000 mL | ORAL_SOLUTION | ORAL | Status: DC | PRN
  Filled 2018-03-13: qty 15

## 2018-03-13 MED ORDER — NITROFURANTOIN MONOHYD MACRO 100 MG PO CAPS: 100.0000 mg | ORAL_CAPSULE | Freq: Two times a day (BID) | ORAL | Status: DC

## 2018-03-13 MED ORDER — ACETAMINOPHEN 650 MG RE SUPP: 325.0000 mg | RECTAL | Status: DC | PRN

## 2018-03-13 MED ORDER — HYDROMORPHONE HCL 1 MG/ML IJ SOLN
INTRAMUSCULAR | Status: DC | PRN
  Administered 2018-03-13: .4 mg via INTRAVENOUS

## 2018-03-13 MED ORDER — OXYCODONE HCL 5 MG PO TABS: 5.0000 mg | ORAL_TABLET | ORAL | Status: DC | PRN

## 2018-03-13 MED ORDER — HEPARIN SODIUM (PORCINE) 1000 UNIT/ML IJ SOLN
INTRAMUSCULAR | Status: DC | PRN
  Administered 2018-03-13: 7000 [IU] via INTRAVENOUS

## 2018-03-13 MED ORDER — LIDOCAINE HCL (PF) 1 % IJ SOLN
INTRAMUSCULAR | Status: AC
  Filled 2018-03-13: qty 30

## 2018-03-13 MED ORDER — ISOSORBIDE MONONITRATE ER 60 MG PO TB24
60.0000 mg | ORAL_TABLET | Freq: Every day | ORAL | Status: DC
  Administered 2018-03-14: 60 mg via ORAL
  Filled 2018-03-13: qty 1

## 2018-03-13 MED ORDER — PROPOFOL 10 MG/ML IV BOLUS
INTRAVENOUS | Status: AC
  Filled 2018-03-13: qty 20

## 2018-03-13 MED ORDER — FENTANYL CITRATE (PF) 100 MCG/2ML IJ SOLN
INTRAMUSCULAR | Status: DC | PRN
  Administered 2018-03-13: 50 ug via INTRAVENOUS

## 2018-03-13 MED ORDER — LACTATED RINGERS IV SOLN
INTRAVENOUS | Status: DC | PRN
  Administered 2018-03-13: 09:00:00 via INTRAVENOUS

## 2018-03-13 MED ORDER — ONDANSETRON HCL 4 MG/2ML IJ SOLN: 4.0000 mg | Freq: Once | INTRAMUSCULAR | Status: DC | PRN

## 2018-03-13 MED ORDER — ONDANSETRON HCL 4 MG/2ML IJ SOLN
INTRAMUSCULAR | Status: DC | PRN
  Administered 2018-03-13: 4 mg via INTRAVENOUS

## 2018-03-13 MED ORDER — ASPIRIN 81 MG PO TABS: 81.0000 mg | ORAL_TABLET | Freq: Every day | ORAL | Status: DC

## 2018-03-13 MED ORDER — HYDROMORPHONE HCL 1 MG/ML IJ SOLN
INTRAMUSCULAR | Status: AC
  Filled 2018-03-13: qty 1

## 2018-03-13 MED ORDER — ADULT MULTIVITAMIN W/MINERALS CH
1.0000 | ORAL_TABLET | Freq: Every day | ORAL | Status: DC
  Administered 2018-03-14: 1 via ORAL
  Filled 2018-03-13: qty 1

## 2018-03-13 MED ORDER — LABETALOL HCL 5 MG/ML IV SOLN: 10.0000 mg | INTRAVENOUS | Status: DC | PRN

## 2018-03-13 MED ORDER — SODIUM CHLORIDE 0.9 % IV SOLN
INTRAVENOUS | Status: DC | PRN
  Administered 2018-03-13: 11:00:00 via INTRAMUSCULAR

## 2018-03-13 MED ORDER — SUGAMMADEX SODIUM 500 MG/5ML IV SOLN
INTRAVENOUS | Status: DC | PRN
  Administered 2018-03-13: 200 mg via INTRAVENOUS

## 2018-03-13 MED ORDER — FENTANYL CITRATE (PF) 100 MCG/2ML IJ SOLN
INTRAMUSCULAR | Status: AC
  Administered 2018-03-13: 25 ug via INTRAVENOUS
  Filled 2018-03-13: qty 2

## 2018-03-13 MED ORDER — LACTATED RINGERS IV SOLN: INTRAVENOUS | Status: DC

## 2018-03-13 MED ORDER — FELODIPINE ER 2.5 MG PO TB24
2.5000 mg | ORAL_TABLET | Freq: Every day | ORAL | Status: DC
  Administered 2018-03-14: 2.5 mg via ORAL
  Filled 2018-03-13: qty 1

## 2018-03-13 MED ORDER — MIDAZOLAM HCL 2 MG/2ML IJ SOLN
INTRAMUSCULAR | Status: AC
  Filled 2018-03-13: qty 2

## 2018-03-13 MED ORDER — LIDOCAINE HCL (CARDIAC) PF 100 MG/5ML IV SOSY
PREFILLED_SYRINGE | INTRAVENOUS | Status: DC | PRN
  Administered 2018-03-13: 80 mg via INTRAVENOUS

## 2018-03-13 MED ORDER — NITROGLYCERIN IN D5W 200-5 MCG/ML-% IV SOLN
INTRAVENOUS | Status: AC
  Filled 2018-03-13: qty 250

## 2018-03-13 MED ORDER — VANCOMYCIN HCL IN DEXTROSE 1-5 GM/200ML-% IV SOLN
INTRAVENOUS | Status: AC
  Administered 2018-03-13: 1000 mg via INTRAVENOUS
  Filled 2018-03-13: qty 200

## 2018-03-13 MED ORDER — DEXAMETHASONE SODIUM PHOSPHATE 10 MG/ML IJ SOLN
INTRAMUSCULAR | Status: DC | PRN
  Administered 2018-03-13: 10 mg via INTRAVENOUS

## 2018-03-13 MED ORDER — VANCOMYCIN HCL IN DEXTROSE 1-5 GM/200ML-% IV SOLN
1000.0000 mg | Freq: Two times a day (BID) | INTRAVENOUS | Status: AC
  Administered 2018-03-13 – 2018-03-14 (×2): 1000 mg via INTRAVENOUS
  Filled 2018-03-13 (×2): qty 200

## 2018-03-13 MED ORDER — FENTANYL CITRATE (PF) 250 MCG/5ML IJ SOLN
INTRAMUSCULAR | Status: AC
  Filled 2018-03-13: qty 5

## 2018-03-13 MED ORDER — ACETAMINOPHEN 325 MG PO TABS
325.0000 mg | ORAL_TABLET | ORAL | Status: DC | PRN
  Administered 2018-03-14: 650 mg via ORAL
  Filled 2018-03-13: qty 2

## 2018-03-13 MED ORDER — REMIFENTANIL HCL 1 MG IV SOLR
INTRAVENOUS | Status: DC | PRN
  Administered 2018-03-13: .05 ug/kg/min via INTRAVENOUS

## 2018-03-13 MED ORDER — LOSARTAN POTASSIUM 50 MG PO TABS
100.0000 mg | ORAL_TABLET | Freq: Every day | ORAL | Status: DC
  Administered 2018-03-13: 100 mg via ORAL
  Filled 2018-03-13: qty 2

## 2018-03-13 MED ORDER — DOCUSATE SODIUM 100 MG PO CAPS
100.0000 mg | ORAL_CAPSULE | Freq: Every day | ORAL | Status: DC
  Administered 2018-03-14: 100 mg via ORAL
  Filled 2018-03-13: qty 1

## 2018-03-13 MED ORDER — METOPROLOL TARTRATE 50 MG PO TABS: 100.0000 mg | ORAL_TABLET | Freq: Two times a day (BID) | ORAL | Status: DC

## 2018-03-13 MED ORDER — PROPOFOL 10 MG/ML IV BOLUS
INTRAVENOUS | Status: DC | PRN
  Administered 2018-03-13: 120 mg via INTRAVENOUS

## 2018-03-13 MED ORDER — SODIUM CHLORIDE 0.9 % IV SOLN: 500.0000 mL | Freq: Once | INTRAVENOUS | Status: DC | PRN

## 2018-03-13 MED ORDER — PREDNISONE 2.5 MG PO TABS
2.5000 mg | ORAL_TABLET | Freq: Every day | ORAL | Status: DC
  Administered 2018-03-14: 2.5 mg via ORAL
  Filled 2018-03-13: qty 1

## 2018-03-13 MED ORDER — ALPRAZOLAM 0.5 MG PO TABS: 0.2500 mg | ORAL_TABLET | Freq: Every evening | ORAL | Status: DC | PRN

## 2018-03-13 MED ORDER — FAMOTIDINE IN NACL 20-0.9 MG/50ML-% IV SOLN
20.0000 mg | Freq: Two times a day (BID) | INTRAVENOUS | Status: DC
  Administered 2018-03-13 – 2018-03-14 (×3): 20 mg via INTRAVENOUS
  Filled 2018-03-13 (×3): qty 50

## 2018-03-13 MED ORDER — ALUM & MAG HYDROXIDE-SIMETH 200-200-20 MG/5ML PO SUSP
15.0000 mL | ORAL | Status: DC | PRN
  Filled 2018-03-13: qty 30

## 2018-03-13 MED ORDER — MAGNESIUM SULFATE 2 GM/50ML IV SOLN: 2.0000 g | Freq: Every day | INTRAVENOUS | Status: DC | PRN

## 2018-03-13 MED ORDER — ONDANSETRON HCL 4 MG/2ML IJ SOLN: 4.0000 mg | Freq: Four times a day (QID) | INTRAMUSCULAR | Status: DC | PRN

## 2018-03-13 MED ORDER — POTASSIUM CHLORIDE IN NACL 20-0.9 MEQ/L-% IV SOLN
INTRAVENOUS | Status: DC
  Administered 2018-03-13 – 2018-03-14 (×2): via INTRAVENOUS
  Filled 2018-03-13 (×3): qty 1000

## 2018-03-13 MED ORDER — PHENYLEPHRINE HCL 10 MG/ML IJ SOLN
INTRAMUSCULAR | Status: AC
  Filled 2018-03-13: qty 1

## 2018-03-13 MED ORDER — METOPROLOL TARTRATE 5 MG/5ML IV SOLN: 2.0000 mg | INTRAVENOUS | Status: DC | PRN

## 2018-03-13 MED ORDER — FAMOTIDINE 20 MG PO TABS
20.0000 mg | ORAL_TABLET | Freq: Once | ORAL | Status: AC
  Administered 2018-03-13: 20 mg via ORAL

## 2018-03-13 MED ORDER — EPHEDRINE SULFATE 50 MG/ML IJ SOLN
INTRAMUSCULAR | Status: DC | PRN
  Administered 2018-03-13: 10 mg via INTRAVENOUS

## 2018-03-13 MED ORDER — PHENOL 1.4 % MT LIQD
1.0000 | OROMUCOSAL | Status: DC | PRN
  Administered 2018-03-13: 1 via OROMUCOSAL
  Filled 2018-03-13 (×2): qty 177

## 2018-03-13 MED ORDER — LOSARTAN POTASSIUM-HCTZ 100-12.5 MG PO TABS: 1.0000 | ORAL_TABLET | Freq: Every day | ORAL | Status: DC

## 2018-03-13 MED ORDER — ATORVASTATIN CALCIUM 20 MG PO TABS
20.0000 mg | ORAL_TABLET | Freq: Every evening | ORAL | Status: DC
  Administered 2018-03-13: 20 mg via ORAL
  Filled 2018-03-13: qty 1

## 2018-03-13 MED ORDER — HYDROCHLOROTHIAZIDE 12.5 MG PO CAPS
12.5000 mg | ORAL_CAPSULE | Freq: Every day | ORAL | Status: DC
  Administered 2018-03-13 – 2018-03-14 (×2): 12.5 mg via ORAL
  Filled 2018-03-13 (×2): qty 1

## 2018-03-13 MED ORDER — LIDOCAINE HCL 1 % IJ SOLN
INTRAMUSCULAR | Status: DC | PRN
  Administered 2018-03-13: 2 mL

## 2018-03-13 MED ORDER — SODIUM CHLORIDE 0.9 % IV SOLN
INTRAVENOUS | Status: DC | PRN
  Administered 2018-03-13: 10 ug/min via INTRAVENOUS

## 2018-03-13 MED ORDER — ASPIRIN EC 81 MG PO TBEC
81.0000 mg | DELAYED_RELEASE_TABLET | Freq: Every day | ORAL | Status: DC
  Administered 2018-03-13 – 2018-03-14 (×2): 81 mg via ORAL
  Filled 2018-03-13 (×2): qty 1

## 2018-03-13 MED ORDER — PHENYLEPHRINE HCL 10 MG/ML IJ SOLN
INTRAMUSCULAR | Status: DC | PRN
  Administered 2018-03-13: 100 ug via INTRAVENOUS

## 2018-03-13 MED ORDER — PANTOPRAZOLE SODIUM 40 MG PO TBEC
40.0000 mg | DELAYED_RELEASE_TABLET | Freq: Every day | ORAL | Status: DC
  Administered 2018-03-14: 40 mg via ORAL
  Filled 2018-03-13: qty 1

## 2018-03-13 MED ORDER — SODIUM CHLORIDE 0.9 % IV SOLN
INTRAVENOUS | Status: DC
  Administered 2018-03-13: 17:00:00 via INTRAVENOUS

## 2018-03-13 MED ORDER — GABAPENTIN 100 MG PO CAPS
100.0000 mg | ORAL_CAPSULE | Freq: Every day | ORAL | Status: DC
  Administered 2018-03-13: 100 mg via ORAL
  Filled 2018-03-13: qty 1

## 2018-03-13 MED ORDER — REMIFENTANIL HCL 1 MG IV SOLR
INTRAVENOUS | Status: AC
  Filled 2018-03-13: qty 1000

## 2018-03-13 MED ORDER — HYDRALAZINE HCL 20 MG/ML IJ SOLN: 5.0000 mg | INTRAMUSCULAR | Status: DC | PRN

## 2018-03-13 MED ORDER — ROCURONIUM BROMIDE 100 MG/10ML IV SOLN
INTRAVENOUS | Status: DC | PRN
  Administered 2018-03-13: 5 mg via INTRAVENOUS
  Administered 2018-03-13: 20 mg via INTRAVENOUS

## 2018-03-13 MED ORDER — MORPHINE SULFATE (PF) 2 MG/ML IV SOLN: 2.0000 mg | INTRAVENOUS | Status: DC | PRN

## 2018-03-13 MED ORDER — POTASSIUM CHLORIDE CRYS ER 20 MEQ PO TBCR: 20.0000 meq | EXTENDED_RELEASE_TABLET | Freq: Every day | ORAL | Status: DC | PRN

## 2018-03-13 MED ORDER — FAMOTIDINE 20 MG PO TABS
ORAL_TABLET | ORAL | Status: AC
  Administered 2018-03-13: 20 mg via ORAL
  Filled 2018-03-13: qty 1

## 2018-03-13 MED ORDER — LACTATED RINGERS IV SOLN
INTRAVENOUS | Status: DC
  Administered 2018-03-13 (×2): via INTRAVENOUS

## 2018-03-13 MED ORDER — DOPAMINE-DEXTROSE 3.2-5 MG/ML-% IV SOLN
0.0000 ug/kg/min | INTRAVENOUS | Status: DC
  Filled 2018-03-13: qty 250

## 2018-03-13 MED ORDER — FENTANYL CITRATE (PF) 100 MCG/2ML IJ SOLN
25.0000 ug | INTRAMUSCULAR | Status: DC | PRN
  Administered 2018-03-13 (×2): 25 ug via INTRAVENOUS

## 2018-03-13 MED ORDER — EVICEL 2 ML EX KIT
PACK | CUTANEOUS | Status: AC
  Filled 2018-03-13: qty 1

## 2018-03-13 SURGICAL SUPPLY — 64 items
APPLIER CLIP 11 MED OPEN (CLIP)
APPLIER CLIP 9.375 SM OPEN (CLIP)
BAG DECANTER FOR FLEXI CONT (MISCELLANEOUS) ×3 IMPLANT
BLADE SURG 15 STRL LF DISP TIS (BLADE) ×1 IMPLANT
BLADE SURG 15 STRL SS (BLADE) ×2
BLADE SURG SZ11 CARB STEEL (BLADE) ×3 IMPLANT
BOOT SUTURE AID YELLOW STND (SUTURE) ×6 IMPLANT
BRUSH SCRUB EZ  4% CHG (MISCELLANEOUS) ×2
BRUSH SCRUB EZ 4% CHG (MISCELLANEOUS) ×1 IMPLANT
CANISTER SUCT 1200ML W/VALVE (MISCELLANEOUS) ×3 IMPLANT
CLIP APPLIE 11 MED OPEN (CLIP) IMPLANT
CLIP APPLIE 9.375 SM OPEN (CLIP) IMPLANT
DERMABOND ADVANCED (GAUZE/BANDAGES/DRESSINGS) ×2
DERMABOND ADVANCED .7 DNX12 (GAUZE/BANDAGES/DRESSINGS) ×1 IMPLANT
DRAPE INCISE IOBAN 66X45 STRL (DRAPES) ×3 IMPLANT
DRAPE LAPAROTOMY 100X77 ABD (DRAPES) ×3 IMPLANT
DRAPE SHEET LG 3/4 BI-LAMINATE (DRAPES) ×3 IMPLANT
DRESSING SURGICEL FIBRLLR 1X2 (HEMOSTASIS) ×1 IMPLANT
DRSG SURGICEL FIBRILLAR 1X2 (HEMOSTASIS) ×3
DURAPREP 26ML APPLICATOR (WOUND CARE) ×3 IMPLANT
ELECT CAUTERY BLADE 6.4 (BLADE) ×3 IMPLANT
ELECT REM PT RETURN 9FT ADLT (ELECTROSURGICAL) ×3
ELECTRODE REM PT RTRN 9FT ADLT (ELECTROSURGICAL) ×1 IMPLANT
GLOVE BIO SURGEON STRL SZ7 (GLOVE) ×3 IMPLANT
GLOVE INDICATOR 7.5 STRL GRN (GLOVE) ×3 IMPLANT
GLOVE SURG SYN 8.0 (GLOVE) ×3 IMPLANT
GOWN STRL REUS W/ TWL LRG LVL3 (GOWN DISPOSABLE) ×2 IMPLANT
GOWN STRL REUS W/ TWL XL LVL3 (GOWN DISPOSABLE) ×1 IMPLANT
GOWN STRL REUS W/TWL LRG LVL3 (GOWN DISPOSABLE) ×4
GOWN STRL REUS W/TWL XL LVL3 (GOWN DISPOSABLE) ×2
HOLDER FOLEY CATH W/STRAP (MISCELLANEOUS) ×3 IMPLANT
IV NS 500ML (IV SOLUTION) ×2
IV NS 500ML BAXH (IV SOLUTION) ×1 IMPLANT
KIT TURNOVER KIT A (KITS) ×3 IMPLANT
LABEL OR SOLS (LABEL) ×3 IMPLANT
LOOP RED MAXI  1X406MM (MISCELLANEOUS) ×4
LOOP VESSEL MAXI 1X406 RED (MISCELLANEOUS) ×2 IMPLANT
LOOP VESSEL MINI 0.8X406 BLUE (MISCELLANEOUS) ×1 IMPLANT
LOOPS BLUE MINI 0.8X406MM (MISCELLANEOUS) ×2
NEEDLE FILTER BLUNT 18X 1/2SAF (NEEDLE) ×2
NEEDLE FILTER BLUNT 18X1 1/2 (NEEDLE) ×1 IMPLANT
NEEDLE HYPO 25X1 1.5 SAFETY (NEEDLE) ×3 IMPLANT
NS IRRIG 500ML POUR BTL (IV SOLUTION) ×3 IMPLANT
PACK BASIN MAJOR ARMC (MISCELLANEOUS) ×3 IMPLANT
PENCIL ELECTRO HAND CTR (MISCELLANEOUS) IMPLANT
SHUNT CAROTID STR REINF 3.0X4. (MISCELLANEOUS) ×3 IMPLANT
SUT MNCRL+ 5-0 UNDYED PC-3 (SUTURE) ×1 IMPLANT
SUT MONOCRYL 5-0 (SUTURE) ×2
SUT PROLENE 6 0 BV (SUTURE) ×24 IMPLANT
SUT PROLENE 7 0 BV 1 (SUTURE) ×18 IMPLANT
SUT SILK 2 0 (SUTURE) ×2
SUT SILK 2-0 18XBRD TIE 12 (SUTURE) ×1 IMPLANT
SUT SILK 3 0 (SUTURE) ×2
SUT SILK 3-0 18XBRD TIE 12 (SUTURE) ×1 IMPLANT
SUT SILK 4 0 (SUTURE) ×2
SUT SILK 4-0 18XBRD TIE 12 (SUTURE) ×1 IMPLANT
SUT VIC AB 3-0 SH 27 (SUTURE) ×2
SUT VIC AB 3-0 SH 27X BRD (SUTURE) ×1 IMPLANT
SYR 10ML LL (SYRINGE) ×3 IMPLANT
SYR 20CC LL (SYRINGE) ×3 IMPLANT
SYR 3ML LL SCALE MARK (SYRINGE) ×3 IMPLANT
TRAY FOLEY MTR SLVR 16FR STAT (SET/KITS/TRAYS/PACK) ×3 IMPLANT
TUBING CONNECTING 10 (TUBING) IMPLANT
TUBING CONNECTING 10' (TUBING)

## 2018-03-13 NOTE — Transfer of Care (Signed)
Immediate Anesthesia Transfer of Care Note  Patient: Mrytle Bento Hayworth  Procedure(s) Performed: ENDARTERECTOMY CAROTID (Right )  Patient Location: PACU  Anesthesia Type:General  Level of Consciousness: sedated  Airway & Oxygen Therapy: Patient Spontanous Breathing and Patient connected to face mask oxygen  Post-op Assessment: Report given to RN and Post -op Vital signs reviewed and stable  Post vital signs: Reviewed and stable  Last Vitals:  Vitals Value Taken Time  BP 119/52 03/13/2018 12:26 PM  Temp 36.2 C 03/13/2018 12:26 PM  Pulse 64 03/13/2018 12:38 PM  Resp 19 03/13/2018 12:38 PM  SpO2 95 % 03/13/2018 12:38 PM  Vitals shown include unvalidated device data.  Last Pain:  Vitals:   03/13/18 0738  TempSrc: Temporal  PainSc: 0-No pain         Complications: No apparent anesthesia complications

## 2018-03-13 NOTE — Anesthesia Post-op Follow-up Note (Signed)
Anesthesia QCDR form completed.        

## 2018-03-13 NOTE — Progress Notes (Signed)
Dr. Delana Meyer informed of patient's HR dropping to 49bpm at times with a sustained HR of 50-52. HR on admission 58-62.  Patient is asymptomatic.  No new orders at this time.  Will continue to monitor closely.

## 2018-03-13 NOTE — Progress Notes (Signed)
Larita Fife, PACU RN was primary RN in recovery room until relieved by Shary Key, PACU RN. There was no treatment provided by Laveda Norman, PACU RN.

## 2018-03-13 NOTE — Progress Notes (Signed)
Dr. Delana Meyer called and informed about pt's HR sustained at 44-46 bpm with BP trending downward. Patient remains asymptomatic. No new orders at this time.  Will continue to assess and monitor closely.

## 2018-03-13 NOTE — Anesthesia Procedure Notes (Signed)
Procedure Name: Intubation Date/Time: 03/13/2018 9:41 AM Performed by: Justus Memory, CRNA Pre-anesthesia Checklist: Patient identified, Patient being monitored, Timeout performed, Emergency Drugs available and Suction available Patient Re-evaluated:Patient Re-evaluated prior to induction Oxygen Delivery Method: Circle system utilized Preoxygenation: Pre-oxygenation with 100% oxygen Induction Type: IV induction Ventilation: Mask ventilation without difficulty Laryngoscope Size: Mac and 3 Grade View: Grade I Tube type: Oral Tube size: 7.0 mm Number of attempts: 1 Airway Equipment and Method: Stylet and Video-laryngoscopy Placement Confirmation: ETT inserted through vocal cords under direct vision,  positive ETCO2 and breath sounds checked- equal and bilateral Secured at: 21 cm Tube secured with: Tape Dental Injury: Teeth and Oropharynx as per pre-operative assessment  Difficulty Due To: Difficulty was anticipated and Difficult Airway- due to anterior larynx Future Recommendations: Recommend- induction with short-acting agent, and alternative techniques readily available

## 2018-03-13 NOTE — Op Note (Signed)
[name]   OPERATIVE NOTE  PROCEDURE:   1.  Right carotid endarterectomy with Primary Closure  PRE-OPERATIVE DIAGNOSIS: 1.  Critical stenosis of the right carotid Artery 2.  Hypertension  POST-OPERATIVE DIAGNOSIS: same as above   SURGEON: Katha Cabal, MD  ASSISTANT(S): Ms. Hezzie Bump  ANESTHESIA: general  ESTIMATED BLOOD LOSS: 70 cc  FINDING(S): 1.  Extensive calcified carotid plaque.  SPECIMEN(S):  Carotid plaque (sent to Pathology)  INDICATIONS:   Dawn Mcintyre is a 84 y.o. y.o. female who presents with right carotid stenosis of 90 %.  The risks, benefits, and alternatives to carotid endarterectomy were discussed with the patient. The differences between carotid stenting and carotid endarterectomy were reviewed.  The patient voiced understanding and appears to be aware that the risks of carotid endarterectomy include but are not limited to: bleeding, infection, stroke, myocardial infarction, death, cranial nerve injuries both temporary and permanent, neck hematoma, possible airway compromise, labile blood pressure post-operatively, cerebral hyperperfusion syndrome, and possible need for additional interventions in the future. The patient is aware of the risks and agrees to proceed forward with the procedure.  DESCRIPTION: After full informed written consent was obtained from the patient, the patient was brought back to the operating room and placed supine upon the operating table.  Prior to induction, the patient received IV antibiotics.  After obtaining adequate anesthesia, the patient was placed a supine position with a shoulder roll in place and the patient's neck slightly hyperextended and rotated away from the surgical site.  The patient was prepped in the standard fashion for a carotid endarterectomy.    The skin incision was made in the right neck anterior to the sternocleidomastoid muscle and dissected down through the subcutaneous  tissue.  The platysmas was opened with electrocautery.  The internal jugular vein and facial vein were identified.  The facial vein is ligated and divided between 2-0 silk ties.  The omohyoid was identified in the common carotid artery exposed at this level. The dissection was there in carried out along the carotid artery in a cranial direction.  The dissection was then carried along periadventitial plane along the common carotid artery up to the bifurcation. The external carotid artery was identified. Vessel loops were then placed around the external carotid artery as well as the superior thyroid artery. In the process of this dissection, the hypoglossal nerve was identified and protected from harm.  The internal carotid artery was then dissected circumferentially just beyond an area in the internal carotid artery distal to the plaque.    At this point, we gave the patient 7000 units of intravenous heparin and this was allowed to circulate for several minutes.  The common carotid artery followed by the external carotid and then the internal carotid artery were clamped.  Arteriotomy was made in the common carotid artery with a 11 blade, and extended the arteriotomy with a Potts scissor down into the common carotid artery, then the arteriotomy was carried through the bifurcation into the internal carotid artery until I reached an area that was not diseased.  At this point, a Sundt shunt was placed without difficulty.  The endarterectomy was begun in the common carotid artery with a Garment/textile technologist and carried this dissection down into the common carotid artery circumferentially.  Then I transected the plaque at a segment where it was adherent and transected the plaque with Potts scissors.  I then carried this dissection up into the external carotid artery.  The plaque  was extracted by unclamping the external carotid artery and performing an eversion endarterectomy.  The dissection was then carried into the  internal carotid artery where a  feathered end point was created.  The plaque was passed off the field as a specimen.  The distal endpoint was tacked down with 6 interrupted 7-0 Prolene sutures.  The arteriotomy was then closed using running 6-0 Prolene beginning at the distal margin and running the second 6-0 Prolene from the common carotid end.  Prior to completing the primary repair, the shunt was removed, the internal carotid artery was flushed and there was excellent backbleeding.  The carotid artery repair was flushed with heparinized saline and then the primary repair was completed in the usual fashion.  The flow was then reestablished first to the external carotid artery and then the internal carotid artery to prevent distal embolization.   Several minutes of pressure were held and 6-0 Prolene sutures were used as need for hemostasis.  At this point, I placed Surgicel and Evicel topical hemostatic agents.  There was no more active bleeding in the surgical site.  The sternocleidomastoid space was closed with three interrupted 3-0 Vicryl sutures. I then reapproximated the platysma muscle with a running stitch of 3-0 Vicryl.  The skin was then closed with a running subcuticular 4-0 Monocryl.  The skin was then cleaned, dried and Dermabond was used to reinforce the skin closure.  The patient awakened and was taken to the recovery room in stable condition, following commands and moving all four extremities without any apparent deficits.    COMPLICATIONS: none  CONDITION: stable  [name] 03/13/2018<11:56 AM

## 2018-03-13 NOTE — Anesthesia Procedure Notes (Signed)
Arterial Line Insertion Start/End8/04/2018 9:44 AM, 03/13/2018 9:49 AM Performed by: Justus Memory, CRNA, CRNA  Patient location: OR. Preanesthetic checklist: patient identified, IV checked, site marked, risks and benefits discussed, surgical consent, monitors and equipment checked, pre-op evaluation, timeout performed and anesthesia consent Left, radial was placed Catheter size: 20 G Hand hygiene performed , maximum sterile barriers used  and Seldinger technique used Allen's test indicative of satisfactory collateral circulation Attempts: 2 Procedure performed without using ultrasound guided technique. Following insertion, dressing applied. Post procedure assessment: normal  Post procedure complications: local hematoma. Patient tolerated the procedure well with no immediate complications.

## 2018-03-13 NOTE — Progress Notes (Signed)
Dr. Delana Meyer to bedside to assess patient.  Changes made to medications related to HR and BP.  334ml bolus running over 2 hours at this time.  Orders received for dopamine drip if needed.  Per Dr. Delana Meyer, start dopamine drip if patient's HR sustains less than 40bpm and pt is hypotensive.    Will continue to assess and monitor closely.

## 2018-03-13 NOTE — Anesthesia Preprocedure Evaluation (Signed)
Anesthesia Evaluation  Patient identified by MRN, date of birth, ID band Patient awake    Reviewed: Allergy & Precautions, H&P , NPO status , Patient's Chart, lab work & pertinent test results  History of Anesthesia Complications Negative for: history of anesthetic complications  Airway Mallampati: III  TM Distance: <3 FB Neck ROM: limited    Dental  (+) Chipped, Poor Dentition   Pulmonary neg shortness of breath, pneumonia,           Cardiovascular Exercise Tolerance: Good hypertension, (-) angina+ CAD and + Peripheral Vascular Disease  (-) Past MI and (-) DOE      Neuro/Psych Anxiety  Neuromuscular disease    GI/Hepatic Neg liver ROS, GERD  Medicated and Controlled,  Endo/Other  diabetes, Type 2  Renal/GU Renal disease     Musculoskeletal  (+) Arthritis ,   Abdominal   Peds  Hematology negative hematology ROS (+)   Anesthesia Other Findings Past Medical History: No date: Arthritis 11/16/2014: Atrophic vaginitis No date: Cataract No date: GERD (gastroesophageal reflux disease) No date: Hyperlipidemia No date: Hypertension No date: Osteoporosis No date: Pneumonia     Comment:   x 45yrs ago No date: Pre-diabetes No date: Ulcer  Past Surgical History: 1972: ABDOMINAL HYSTERECTOMY 1972: BREAST CYST EXCISION; Right 1972: BREAST CYST EXCISION; Left No date: EYE SURGERY No date: THROAT SURGERY     Comment:  x 10 yrs ago. throat mass.  BMI    Body Mass Index:  22.87 kg/m      Reproductive/Obstetrics negative OB ROS                             Anesthesia Physical  Anesthesia Plan  ASA: III  Anesthesia Plan: General ETT   Post-op Pain Management:    Induction: Intravenous  PONV Risk Score and Plan: Ondansetron, Dexamethasone, Midazolam and Treatment may vary due to age or medical condition  Airway Management Planned: Oral ETT and Video Laryngoscope  Planned  Additional Equipment: Arterial line  Intra-op Plan:   Post-operative Plan: Extubation in OR  Informed Consent: I have reviewed the patients History and Physical, chart, labs and discussed the procedure including the risks, benefits and alternatives for the proposed anesthesia with the patient or authorized representative who has indicated his/her understanding and acceptance.   Dental Advisory Given  Plan Discussed with: Anesthesiologist, CRNA and Surgeon  Anesthesia Plan Comments: (Patient consented for risks of anesthesia including but not limited to:  - adverse reactions to medications - damage to teeth, lips or other oral mucosa - sore throat or hoarseness - Damage to heart, brain, lungs or loss of life  Patient voiced understanding.)        Anesthesia Quick Evaluation

## 2018-03-13 NOTE — H&P (Signed)
MRN : [id number]  Dawn Mcintyre is a 84 y.o. (October 22, 1932) female who presents with chief complaint of carotid blockage.  History of Present Illness:   The patient presents to day for right carotid endarterectomy.  It has been 32 days since she was seen in the office.  There have not been any major changes in her overall health since her last office visit.  Significant past carotid / vascular history is as follows:  Leftcarotid endarterectomy with Primary Closure on 12/24/2017.  The patient is seen for follow up evaluation of carotid stenosis status post left carotid endarterectomy on 12/24/2017.  There were no post operative problems or complications related to the surgery.  The patient denies neck or incisional pain.  The patient denies interval amaurosis fugax. There is no recent history of TIA symptoms or focal motor deficits. There is no prior documented CVA.  The patient denies headache.  The patient is taking enteric-coated aspirin 81 mg daily.  The patient has a history of coronary artery disease, no recent episodes of angina or shortness of breath. The patient denies PAD or claudication symptoms. There is a history of hyperlipidemia which is being treated with a statin.   Current Meds  Medication Sig  . acetaminophen (TYLENOL) 500 MG tablet Take 500 mg by mouth daily as needed for headache.  . ALPRAZolam (XANAX) 0.25 MG tablet Take 0.25 mg by mouth at bedtime as needed for sleep.   Marland Kitchen aspirin 81 MG tablet Take 81 mg by mouth daily.    Marland Kitchen atorvastatin (LIPITOR) 20 MG tablet Take 20 mg by mouth every evening.   Marland Kitchen CRANBERRY PO Take 1 capsule by mouth daily.  . [EXPIRED] cyanocobalamin (,VITAMIN B-12,) 1000 MCG/ML injection Inject 1,000 mcg into the muscle every 30 (thirty) days.   . felodipine (PLENDIL) 2.5 MG 24 hr tablet Take 2.5 mg by mouth daily. May take a second 2.5 mg dose as needed if bp is over 150/90  . fexofenadine (ALLEGRA) 180 MG tablet Take 180 mg by mouth  daily as needed for allergies.   . fluticasone (FLONASE) 50 MCG/ACT nasal spray Place 2 sprays into the nose daily as needed for allergies.   Marland Kitchen gabapentin (NEURONTIN) 100 MG capsule Take 100 mg by mouth at bedtime.   . isosorbide mononitrate (IMDUR) 60 MG 24 hr tablet Take 60 mg by mouth daily.    Marland Kitchen losartan-hydrochlorothiazide (HYZAAR) 100-12.5 MG tablet Take 1 tablet by mouth daily.   . metoprolol tartrate (LOPRESSOR) 100 MG tablet Take 100 mg by mouth 2 (two) times daily.   . Multiple Vitamin (MULTIVITAMIN WITH MINERALS) TABS tablet Take 1 tablet by mouth daily.  . pantoprazole (PROTONIX) 40 MG tablet Take 40 mg by mouth daily.   . predniSONE (DELTASONE) 2.5 MG tablet Take 2.5 mg by mouth daily.   Marland Kitchen PREMARIN vaginal cream USE 1 GRAM VAGINALLY EVERY OTHER NIGHT (Patient taking differently: USE 1 GRAM VAGINALLY EVERY NIGHT AS NEEDED FOR IRRITATION)    Past Medical History:  Diagnosis Date  . Arteritis ([place])   . Arthritis   . Atrophic vaginitis 11/16/2014  . Carotid artery stenosis   . Cataract   . Difficult intubation   . Environmental and seasonal allergies   . GERD (gastroesophageal reflux disease)   . Hyperlipidemia   . Hypertension   . Lack of bladder control   . Neuropathy   . Osteoporosis   . Pneumonia     x 42yrs ago  . Pre-diabetes   .  Ulcer     Past Surgical History:  Procedure Laterality Date  . ABDOMINAL HYSTERECTOMY  1972  . BREAST CYST EXCISION Right 1972  . BREAST CYST EXCISION Left 1972  . ENDARTERECTOMY Left 12/24/2017   Procedure: ENDARTERECTOMY CAROTID;  Surgeon: Katha Cabal, MD;  Location: [facility] ORS;  Service: Vascular;  Laterality: Left;  . EYE SURGERY    . THROAT SURGERY     x 10 yrs ago. throat mass.  . TONSILLECTOMY      Social History Social History   Tobacco Use  . Smoking status: Never Smoker  . Smokeless tobacco: Never Used  Substance Use Topics  . Alcohol use: No    Alcohol/week: 0.0 standard drinks  . Drug use: No    Family  History Family History  Problem Relation Age of Onset  . Anuerysm Mother   . Cancer Father        prostrate cancer  . Diabetes Brother   . Hearing loss Brother   . Bladder Cancer Neg Hx   . Kidney cancer Neg Hx     Allergies  Allergen Reactions  . Amoxicillin-Pot Clavulanate Other (See Comments)    Unknown reaction Has patient had a PCN reaction causing immediate rash, facial/tongue/throat swelling, SOB or lightheadedness with hypotension: Unknown Has patient had a PCN reaction causing severe rash involving mucus membranes or skin necrosis: Unknown Has patient had a PCN reaction that required hospitalization: Unknown Has patient had a PCN reaction occurring within the last 10 years: No If all of the above answers are "NO", then may proceed with Cephalosporin use.   . Codeine Hives  . Hydrocodone Nausea Only  . Metronidazole Nausea Only  . Propoxyphene Nausea Only  . Latex Rash     REVIEW OF SYSTEMS (Negative unless checked)  Constitutional: [] Weight loss  [] Fever  [] Chills Cardiac: [] Chest pain   [] Chest pressure   [] Palpitations   [] Shortness of breath when laying flat   [] Shortness of breath with exertion. Vascular:  [] Pain in legs with walking   [] Pain in legs at rest  [] History of DVT   [] Phlebitis   [] Swelling in legs   [] Varicose veins   [] Non-healing ulcers Pulmonary:   [] Uses home oxygen   [] Productive cough   [] Hemoptysis   [] Wheeze  [] COPD   [] Asthma Neurologic:  [] Dizziness   [] Seizures   [] History of stroke   [] History of TIA  [] Aphasia   [] Vissual changes   [] Weakness or numbness in arm   [] Weakness or numbness in leg Musculoskeletal:   [] Joint swelling   [] Joint pain   [] Low back pain Hematologic:  [] Easy bruising  [] Easy bleeding   [] Hypercoagulable state   [] Anemic Gastrointestinal:  [] Diarrhea   [] Vomiting  [] Gastroesophageal reflux/heartburn   [] Difficulty swallowing. Genitourinary:  [] Chronic kidney disease   [] Difficult urination  [] Frequent urination    [] Blood in urine Skin:  [] Rashes   [] Ulcers  Psychological:  [] History of anxiety   []  History of major depression.  Physical Examination  Vitals:   03/13/18 0738  BP: (!) 161/78  Pulse: 62  Resp: 16  Temp: (!) 97.4 F (36.3 C)  TempSrc: Temporal  SpO2: 99%  Weight: 65.7 kg  Height: 5\' 7"  (1.702 m)   Body mass index is 22.69 kg/m. Gen: WD/WN, NAD Head: [place]/AT, No temporalis wasting.  Ear/Nose/Throat: Hearing grossly intact, nares w/o erythema or drainage Eyes: PER, EOMI, sclera nonicteric.  Neck: Supple, no large masses.   Pulmonary:  Good air movement, no audible wheezing bilaterally, no  use of accessory muscles.  Cardiac: RRR, no JVD Vascular: left CEA incisional scar well helaed, + right carotid bruit Vessel Right Left  Radial Palpable Palpable  Brachial Palpable Palpable  Carotid Palpable Palpable  Gastrointestinal: Non-distended. No guarding/no peritoneal signs.  Musculoskeletal: M/S 5/5 throughout.  No deformity or atrophy.  Neurologic: CN 2-12 intact. Symmetrical.  Speech is fluent. Motor exam as listed above. Psychiatric: Judgment intact, Mood & affect appropriate for pt's clinical situation. Dermatologic: No rashes or ulcers noted.  No changes consistent with cellulitis. Lymph : No lichenification or skin changes of chronic lymphedema.  CBC Lab Results  Component Value Date   WBC 7.6 03/05/2018   HGB 12.4 03/05/2018   HCT 37.1 03/05/2018   MCV 92.5 03/05/2018   PLT 237 03/05/2018    BMET    Component Value Date/Time   NA 139 03/05/2018 1112   NA 139 09/12/2011 0250   K 4.0 03/05/2018 1112   K 3.5 09/12/2011 0250   CL 102 03/05/2018 1112   CL 102 09/12/2011 0250   CO2 31 03/05/2018 1112   CO2 23 09/12/2011 0250   GLUCOSE 125 (H) 03/05/2018 1112   GLUCOSE 162 (H) 09/12/2011 0250   BUN 21 03/05/2018 1112   BUN 20 05/30/2016 1358   BUN 23 (H) 09/12/2011 0250   CREATININE 0.98 03/05/2018 1112   CREATININE 0.94 09/12/2011 0250   CALCIUM 10.1  03/05/2018 1112   CALCIUM 9.1 09/12/2011 0250   GFRNONAA 52 (L) 03/05/2018 1112   GFRNONAA >60 09/12/2011 0250   GFRAA >60 03/05/2018 1112   GFRAA >60 09/12/2011 0250   Estimated Creatinine Clearance: 42.3 mL/min (by C-G formula based on SCr of 0.98 mg/dL).  COAG Lab Results  Component Value Date   INR 0.99 03/05/2018   INR 1.02 12/16/2017    Radiology Ct Angio Head W Or Wo Contrast  Result Date: 02/12/2018 CLINICAL DATA:  Assess for vascular abnormality. History of hypertension, hyperlipidemia. Status post LEFT carotid endarterectomy. EXAM: CT ANGIOGRAPHY HEAD AND NECK TECHNIQUE: Multidetector CT imaging of the head and neck was performed using the standard protocol during bolus administration of intravenous contrast. Multiplanar CT image reconstructions and MIPs were obtained to evaluate the vascular anatomy. Carotid stenosis measurements (when applicable) are obtained utilizing NASCET criteria, using the distal internal carotid diameter as the denominator. CONTRAST:  145mL ISOVUE-370 IOPAMIDOL (ISOVUE-370) INJECTION 76% COMPARISON:  CT neck October 24, 2017 and CT HEAD September 15, 2009. FINDINGS: CT HEAD FINDINGS BRAIN: No intraparenchymal hemorrhage, mass effect nor midline shift. The ventricles and sulci are normal for age. Patchy supratentorial white matter hypodensities less than expected for patient's age, though non-specific are most compatible with chronic small vessel ischemic disease. No acute large vascular territory infarcts. No abnormal extra-axial fluid collections. Basal cisterns are patent. VASCULAR: Moderate calcific atherosclerosis of the carotid siphons. SKULL: No skull fracture. Severe bilateral temporomandibular osteoarthrosis. No significant scalp soft tissue swelling. SINUSES/ORBITS: The mastoid air-cells and included paranasal sinuses are well-aerated.The included ocular globes and orbital contents are non-suspicious. Status post bilateral ocular lens implants. OTHER:  None. CTA NECK FINDINGS: AORTIC ARCH: Normal appearance of the thoracic arch, mild calcific atherosclerosis. The origins of the innominate, left Common carotid artery and subclavian artery are widely patent. RIGHT CAROTID SYSTEM: Common carotid artery is patent. Eccentric calcific atherosclerosis carotid bifurcation proximal internal carotid artery without hemodynamically significant stenosis by NASCET criteria. Focally beaded appearance RIGHT mid cervical internal carotid artery, this could reflect fibromuscular dysplasia or atherosclerosis. LEFT CAROTID SYSTEM: Common carotid  artery is patent, mild intimal thickening calcific atherosclerosis. Normal appearance of the carotid bifurcation without hemodynamically significant stenosis by NASCET criteria. Status post LEFT carotid endarterectomy. Normal appearance of the internal carotid artery. VERTEBRAL ARTERIES:LEFT vertebral artery arises directly from the aortic arch, normal variant. RIGHT vertebral artery is dominant. Mild extrinsic compression of RIGHT vertebral artery due to degenerative cervical spine. Moderate stenosis RIGHT vertebral artery origin due to atherosclerosis. SKELETON: No acute osseous process though bone windows have not been submitted. Torus mandibularis. Degenerative changes cervical spine resulting in severe RIGHT C4-5, moderate to severe bilateral C5-6 and C6-7 neural foraminal narrowing. Moderate canal stenosis C5-6. OTHER NECK: Soft tissues of the neck are nonacute though, not tailored for evaluation. Stable 7 x 14 mm cyst LEFT vallecula. LEFT neck scarring laterally. UPPER CHEST: Included lung apices are clear. No superior mediastinal lymphadenopathy. RIGHT hilar lymph nodes on axial sequences resulting in spurious appearance of pulmonary arterial filling defect. CTA HEAD FINDINGS: ANTERIOR CIRCULATION: Patent cervical internal carotid arteries, petrous, cavernous and supra clinoid internal carotid arteries. Mild RIGHT, moderate LEFT  stenosis supraclinoid ICA. Patent anterior communicating artery. Patent anterior and middle cerebral arteries, mild luminal irregularity compatible with atherosclerosis. No large vessel occlusion, significant stenosis, contrast extravasation or aneurysm. POSTERIOR CIRCULATION: Patent vertebral arteries, vertebrobasilar junction and basilar artery, as well as main branch vessels. Patent posterior cerebral arteries, mild luminal irregularity compatible with atherosclerosis. Bilateral posterior communicating arteries present. No large vessel occlusion, significant stenosis, contrast extravasation or aneurysm. VENOUS SINUSES: Major dural venous sinuses are patent though not tailored for evaluation on this angiographic examination. ANATOMIC VARIANTS: None. DELAYED PHASE: No abnormal intracranial enhancement. MIP images reviewed. IMPRESSION: CT HEAD: 1. Negative CT HEAD with without contrast for age. CTA NECK: 1. Less than 50% stenosis RIGHT ICA. Status post LEFT carotid endarterectomy without stenosis. 2. Patent vertebral arteries. 3. Moderate canal stenosis C5-6. Severe RIGHT C4-5 neural foraminal narrowing. CTA HEAD: 1.  No emergent large vessel occlusion. 2. Moderate stenosis LEFT supraclinoid ICA. Aortic Atherosclerosis (ICD10-I70.0). Electronically Signed   By: Elon Alas M.D.   On: 02/12/2018 01:54   Ct Angio Neck W And/or Wo Contrast  Result Date: 02/12/2018 CLINICAL DATA:  Assess for vascular abnormality. History of hypertension, hyperlipidemia. Status post LEFT carotid endarterectomy. EXAM: CT ANGIOGRAPHY HEAD AND NECK TECHNIQUE: Multidetector CT imaging of the head and neck was performed using the standard protocol during bolus administration of intravenous contrast. Multiplanar CT image reconstructions and MIPs were obtained to evaluate the vascular anatomy. Carotid stenosis measurements (when applicable) are obtained utilizing NASCET criteria, using the distal internal carotid diameter as the  denominator. CONTRAST:  116mL ISOVUE-370 IOPAMIDOL (ISOVUE-370) INJECTION 76% COMPARISON:  CT neck October 24, 2017 and CT HEAD September 15, 2009. FINDINGS: CT HEAD FINDINGS BRAIN: No intraparenchymal hemorrhage, mass effect nor midline shift. The ventricles and sulci are normal for age. Patchy supratentorial white matter hypodensities less than expected for patient's age, though non-specific are most compatible with chronic small vessel ischemic disease. No acute large vascular territory infarcts. No abnormal extra-axial fluid collections. Basal cisterns are patent. VASCULAR: Moderate calcific atherosclerosis of the carotid siphons. SKULL: No skull fracture. Severe bilateral temporomandibular osteoarthrosis. No significant scalp soft tissue swelling. SINUSES/ORBITS: The mastoid air-cells and included paranasal sinuses are well-aerated.The included ocular globes and orbital contents are non-suspicious. Status post bilateral ocular lens implants. OTHER: None. CTA NECK FINDINGS: AORTIC ARCH: Normal appearance of the thoracic arch, mild calcific atherosclerosis. The origins of the innominate, left Common carotid artery and subclavian artery  are widely patent. RIGHT CAROTID SYSTEM: Common carotid artery is patent. Eccentric calcific atherosclerosis carotid bifurcation proximal internal carotid artery without hemodynamically significant stenosis by NASCET criteria. Focally beaded appearance RIGHT mid cervical internal carotid artery, this could reflect fibromuscular dysplasia or atherosclerosis. LEFT CAROTID SYSTEM: Common carotid artery is patent, mild intimal thickening calcific atherosclerosis. Normal appearance of the carotid bifurcation without hemodynamically significant stenosis by NASCET criteria. Status post LEFT carotid endarterectomy. Normal appearance of the internal carotid artery. VERTEBRAL ARTERIES:LEFT vertebral artery arises directly from the aortic arch, normal variant. RIGHT vertebral artery is dominant.  Mild extrinsic compression of RIGHT vertebral artery due to degenerative cervical spine. Moderate stenosis RIGHT vertebral artery origin due to atherosclerosis. SKELETON: No acute osseous process though bone windows have not been submitted. Torus mandibularis. Degenerative changes cervical spine resulting in severe RIGHT C4-5, moderate to severe bilateral C5-6 and C6-7 neural foraminal narrowing. Moderate canal stenosis C5-6. OTHER NECK: Soft tissues of the neck are nonacute though, not tailored for evaluation. Stable 7 x 14 mm cyst LEFT vallecula. LEFT neck scarring laterally. UPPER CHEST: Included lung apices are clear. No superior mediastinal lymphadenopathy. RIGHT hilar lymph nodes on axial sequences resulting in spurious appearance of pulmonary arterial filling defect. CTA HEAD FINDINGS: ANTERIOR CIRCULATION: Patent cervical internal carotid arteries, petrous, cavernous and supra clinoid internal carotid arteries. Mild RIGHT, moderate LEFT stenosis supraclinoid ICA. Patent anterior communicating artery. Patent anterior and middle cerebral arteries, mild luminal irregularity compatible with atherosclerosis. No large vessel occlusion, significant stenosis, contrast extravasation or aneurysm. POSTERIOR CIRCULATION: Patent vertebral arteries, vertebrobasilar junction and basilar artery, as well as main branch vessels. Patent posterior cerebral arteries, mild luminal irregularity compatible with atherosclerosis. Bilateral posterior communicating arteries present. No large vessel occlusion, significant stenosis, contrast extravasation or aneurysm. VENOUS SINUSES: Major dural venous sinuses are patent though not tailored for evaluation on this angiographic examination. ANATOMIC VARIANTS: None. DELAYED PHASE: No abnormal intracranial enhancement. MIP images reviewed. IMPRESSION: CT HEAD: 1. Negative CT HEAD with without contrast for age. CTA NECK: 1. Less than 50% stenosis RIGHT ICA. Status post LEFT carotid  endarterectomy without stenosis. 2. Patent vertebral arteries. 3. Moderate canal stenosis C5-6. Severe RIGHT C4-5 neural foraminal narrowing. CTA HEAD: 1.  No emergent large vessel occlusion. 2. Moderate stenosis LEFT supraclinoid ICA. Aortic Atherosclerosis (ICD10-I70.0). Electronically Signed   By: Elon Alas M.D.   On: 02/12/2018 01:54     Assessment/Plan 1. Carotid stenosis, bilateral Recommend:  The patient remains asymptomatic with respect to the carotid stenosis.  She has done well with her left CEA and will now move forward with surgery of the right carotid.  Patient's previous CT angiography of the carotid arteries confirms >80% bilateral ICA stenosis.  The anatomical considerations support surgery over stenting.  This was discussed in detail with the patient.  The patient has cardiac clearance from her first carotid.  Dr Tami Ribas did a vocal cord check. Which was normal  The risks, benefits and alternative therapies were reviewed in detail with the patient.  All questions were answered.  The patient agrees to proceed with surgery of the right carotid artery.  Continue antiplatelet therapy as prescribed. Continue management of CAD, HTN and Hyperlipidemia. Healthy heart diet, encouraged exercise at least 4 times per week.     [name], MD  03/13/2018 9:13 AM

## 2018-03-14 LAB — BASIC METABOLIC PANEL
ANION GAP: 7 (ref 5–15)
BUN: 15 mg/dL (ref 8–23)
CALCIUM: 9 mg/dL (ref 8.9–10.3)
CO2: 27 mmol/L (ref 22–32)
Chloride: 107 mmol/L (ref 98–111)
Creatinine, Ser: 0.87 mg/dL (ref 0.44–1.00)
GFR, EST NON AFRICAN AMERICAN: 60 mL/min — AB (ref >60)
Glucose, Bld: 125 mg/dL — ABNORMAL HIGH (ref 70–99)
Potassium: 4 mmol/L (ref 3.5–5.1)
Sodium: 141 mmol/L (ref 135–145)

## 2018-03-14 LAB — CBC
HEMATOCRIT: 31.3 % — AB (ref 35.0–47.0)
Hemoglobin: 10.7 g/dL — ABNORMAL LOW (ref 12.0–16.0)
MCH: 31.9 pg (ref 26.0–34.0)
MCHC: 34.3 g/dL (ref 32.0–36.0)
MCV: 92.9 fL (ref 80.0–100.0)
PLATELETS: 179 10*3/uL (ref 150–440)
RBC: 3.36 MIL/uL — ABNORMAL LOW (ref 3.80–5.20)
RDW: 13.5 % (ref 11.5–14.5)
WBC: 9.6 10*3/uL (ref 3.6–11.0)

## 2018-03-14 NOTE — Plan of Care (Signed)
  Problem: Education: Goal: Knowledge of General Education information will improve Description Including pain rating scale, medication(s)/side effects and non-pharmacologic comfort measures Outcome: Completed/Met   Problem: Health Behavior/Discharge Planning: Goal: Ability to manage health-related needs will improve Outcome: Completed/Met   Problem: Clinical Measurements: Goal: Ability to maintain clinical measurements within normal limits will improve Outcome: Completed/Met Goal: Will remain free from infection Outcome: Completed/Met Goal: Diagnostic test results will improve Outcome: Completed/Met Goal: Respiratory complications will improve Outcome: Completed/Met Goal: Cardiovascular complication will be avoided Outcome: Completed/Met   Problem: Activity: Goal: Risk for activity intolerance will decrease Outcome: Completed/Met   Problem: Nutrition: Goal: Adequate nutrition will be maintained Outcome: Completed/Met   Problem: Coping: Goal: Level of anxiety will decrease Outcome: Completed/Met   Problem: Elimination: Goal: Will not experience complications related to bowel motility Outcome: Completed/Met Goal: Will not experience complications related to urinary retention Outcome: Completed/Met   Problem: Pain Managment: Goal: General experience of comfort will improve Outcome: Completed/Met   Problem: Safety: Goal: Ability to remain free from injury will improve Outcome: Completed/Met   Problem: Skin Integrity: Goal: Risk for impaired skin integrity will decrease Outcome: Completed/Met   Patient to discharge home per MD order.

## 2018-03-14 NOTE — Anesthesia Postprocedure Evaluation (Signed)
Anesthesia Post Note  Patient: Dawn Mcintyre  Procedure(s) Performed: ENDARTERECTOMY CAROTID (Right )  Patient location during evaluation: PACU Anesthesia Type: General Level of consciousness: awake and alert Pain management: pain level controlled Vital Signs Assessment: post-procedure vital signs reviewed and stable Respiratory status: spontaneous breathing, nonlabored ventilation, respiratory function stable and patient connected to nasal cannula oxygen Cardiovascular status: blood pressure returned to baseline and stable Postop Assessment: no apparent nausea or vomiting Anesthetic complications: no     Last Vitals:  Vitals:   03/14/18 0700 03/14/18 0800  BP: (!) 126/50 (!) 113/54  Pulse: (!) 49 (!) 49  Resp: 13 20  Temp:  (!) 36.3 C  SpO2: 97% 94%    Last Pain:  Vitals:   03/14/18 0800  TempSrc: Oral  PainSc:                  Laketa Sandoz S

## 2018-03-14 NOTE — Plan of Care (Signed)
Patient slept most of shift.  Son at bedside until midnight.  Patient able to make needs known.  APAP given at 5 am for surgical pain.  No bleeding/drainage at surgical site.  Blood pressure and heart rate remained stable throughout shift. VSS.  Will continue to monitor.

## 2018-03-14 NOTE — Discharge Summary (Signed)
Physician Discharge Summary  Patient ID: Dawn Mcintyre MRN: [id number] DOB/AGE: 1933/02/20 84 y.o.  Admit date: 03/13/2018 Discharge date: 03/14/2018  Admission Diagnoses:  Asymptomatic Right Carotid Stenosis  Discharge Diagnoses:  Active Problems:   Carotid stenosis, asymptomatic, right   Discharged Condition: good  Hospital Course: Admitted for elective Right CEA. Tolerated the procedure well. Observed in the ICU for bradycardia. Otherwise remained hemodynamically and neurologically stable. Tolerated PO and OOB.  Consults: cardiology  Significant Diagnostic Studies: none  Treatments: surgery: RIGHT Carotid Endarterectomy  Discharge Exam: Blood pressure (!) 113/54, pulse (!) 49, temperature (!) 97.4 F (36.3 C), temperature source Oral, resp. rate 20, height 5\' 7"  (1.702 m), weight 72.2 kg, SpO2 94 %. General appearance: alert and no distress Neck: supple, symmetrical, trachea midline and incision- C/D/I, soft, no hematoma, no erythema Resp: clear to auscultation bilaterally Cardio: regular rate and rhythm, S1, S2 normal, no murmur, click, rub or gallop Neurologic: Alert and oriented X 3, normal strength and tone. Normal symmetric reflexes. Normal coordination and gait  Disposition: Discharge disposition: 01-Home or Self Care       Discharge Instructions    CAROTID [place]: Call MD for difficulty swallowing or speaking; weakness in arms or legs that is a new symtom; severe headache.  If you have increased swelling in the neck and/or  are having difficulty breathing, CALL 911   Complete by:  As directed    Call MD for:  redness, tenderness, or signs of infection (pain, swelling, bleeding, redness, odor or green/yellow discharge around incision site)   Complete by:  As directed    Call MD for:  severe or increased pain, loss or decreased feeling  in affected limb(s)   Complete by:  As directed    Call MD for:  temperature >100.5   Complete by:  As directed    Driving  Restrictions   Complete by:  As directed    No driving for 1 week   Increase activity slowly   Complete by:  As directed    Walk with assistance use walker or cane as needed   Lifting restrictions   Complete by:  As directed    No lifting for 2 weeks   No dressing needed   Complete by:  As directed    Replace only if drainage present   Resume previous diet   Complete by:  As directed       Follow-up Information    Schnier, Dolores Lory, MD Follow up in 2 week(s).   Specialties:  Vascular Surgery, Cardiology, Radiology, Vascular Surgery Why:  carotid duplex Contact information: [place] [address] [phone]           Signed: Evaristo Bury 03/14/2018, 9:00 AM

## 2018-03-15 ENCOUNTER — Encounter: Payer: Self-pay | Admitting: Vascular Surgery

## 2018-03-17 LAB — SURGICAL PATHOLOGY

## 2018-03-20 ENCOUNTER — Telehealth: Payer: Self-pay

## 2018-03-20 NOTE — Telephone Encounter (Signed)
Flagged on EMMI report for having other questions or problems.  First attempt to reach patient made 03/20/18 at 1:50pm, however unable to reach.  Left message encouraging callback.  Will attempt at later time.

## 2018-03-23 NOTE — Telephone Encounter (Signed)
Reached upon second attempt on 03/23/18 at 12pm.  Patient reports that she is doing well and that she doesn't have any questions or concerns at this time.  She said the call picked up her answer incorrectly. She is aware of her follow up appointments.  I thanked her for her time and for participating in the callbacks.

## 2018-03-27 ENCOUNTER — Ambulatory Visit (INDEPENDENT_AMBULATORY_CARE_PROVIDER_SITE_OTHER): Payer: Medicare Other | Admitting: Vascular Surgery

## 2018-03-30 ENCOUNTER — Encounter (INDEPENDENT_AMBULATORY_CARE_PROVIDER_SITE_OTHER): Payer: Self-pay | Admitting: Vascular Surgery

## 2018-03-30 ENCOUNTER — Ambulatory Visit (INDEPENDENT_AMBULATORY_CARE_PROVIDER_SITE_OTHER): Payer: Medicare Other | Admitting: Vascular Surgery

## 2018-03-30 VITALS — BP 160/77 | HR 68 | Resp 13 | Ht 65.0 in | Wt 144.0 lb

## 2018-03-30 DIAGNOSIS — I6529 Occlusion and stenosis of unspecified carotid artery: Secondary | ICD-10-CM

## 2018-03-30 NOTE — Progress Notes (Signed)
Patient ID: Dawn Mcintyre, female   DOB: [birth year], 84 y.o.   MRN: [id number]  No chief complaint on file.   HPI Dawn Mcintyre is a 84 y.o. female.    She is s/p right carotid endarterectomy with Primary Closure on 03/13/2018.  No pain no headache   Past Medical History:  Diagnosis Date  . Arteritis ([place])   . Arthritis   . Atrophic vaginitis 11/16/2014  . Carotid artery stenosis   . Cataract   . Difficult intubation   . Environmental and seasonal allergies   . GERD (gastroesophageal reflux disease)   . Hyperlipidemia   . Hypertension   . Lack of bladder control   . Neuropathy   . Osteoporosis   . Pneumonia     x 29yrs ago  . Pre-diabetes   . Ulcer     Past Surgical History:  Procedure Laterality Date  . ABDOMINAL HYSTERECTOMY  1972  . BREAST CYST EXCISION Right 1972  . BREAST CYST EXCISION Left 1972  . ENDARTERECTOMY Left 12/24/2017   Procedure: ENDARTERECTOMY CAROTID;  Surgeon: Katha Cabal, MD;  Location: [facility] ORS;  Service: Vascular;  Laterality: Left;  . ENDARTERECTOMY Right 03/13/2018   Procedure: ENDARTERECTOMY CAROTID;  Surgeon: Katha Cabal, MD;  Location: [facility] ORS;  Service: Vascular;  Laterality: Right;  . EYE SURGERY    . THROAT SURGERY     x 10 yrs ago. throat mass.  . TONSILLECTOMY        Allergies  Allergen Reactions  . Amoxicillin-Pot Clavulanate Other (See Comments)    Unknown reaction Has patient had a PCN reaction causing immediate rash, facial/tongue/throat swelling, SOB or lightheadedness with hypotension: Unknown Has patient had a PCN reaction causing severe rash involving mucus membranes or skin necrosis: Unknown Has patient had a PCN reaction that required hospitalization: Unknown Has patient had a PCN reaction occurring within the last 10 years: No If all of the above answers are "NO", then may proceed with Cephalosporin use.   . Codeine Hives  . Hydrocodone Nausea Only  . Metronidazole Nausea Only  . Propoxyphene  Nausea Only  . Latex Rash    Current Outpatient Medications  Medication Sig Dispense Refill  . acetaminophen (TYLENOL) 500 MG tablet Take 500 mg by mouth daily as needed for headache.    . ALPRAZolam (XANAX) 0.25 MG tablet Take 0.25 mg by mouth at bedtime as needed for sleep.     Marland Kitchen aspirin 81 MG tablet Take 81 mg by mouth daily.      Marland Kitchen atorvastatin (LIPITOR) 20 MG tablet Take 20 mg by mouth every evening.     Marland Kitchen CRANBERRY PO Take 1 capsule by mouth daily.    . felodipine (PLENDIL) 2.5 MG 24 hr tablet Take 2.5 mg by mouth daily. May take a second 2.5 mg dose as needed if bp is over 150/90    . fexofenadine (ALLEGRA) 180 MG tablet Take 180 mg by mouth daily as needed for allergies.     . fluticasone (FLONASE) 50 MCG/ACT nasal spray Place 2 sprays into the nose daily as needed for allergies.     Marland Kitchen gabapentin (NEURONTIN) 100 MG capsule Take 100 mg by mouth at bedtime.     . isosorbide mononitrate (IMDUR) 60 MG 24 hr tablet Take 60 mg by mouth daily.      Marland Kitchen losartan-hydrochlorothiazide (HYZAAR) 100-12.5 MG tablet Take 1 tablet by mouth daily.     . mirabegron ER (MYRBETRIQ) 25 MG TB24 tablet  Take 1 tablet (25 mg total) daily by mouth. (Patient taking differently: Take 25 mg by mouth daily as needed. ) 30 tablet 11  . Multiple Vitamin (MULTIVITAMIN WITH MINERALS) TABS tablet Take 1 tablet by mouth daily.    . nitrofurantoin, macrocrystal-monohydrate, (MACROBID) 100 MG capsule Take 1 capsule (100 mg total) by mouth every 12 (twelve) hours. (Patient not taking: Reported on 01/08/2018) 14 capsule 0  . pantoprazole (PROTONIX) 40 MG tablet Take 40 mg by mouth daily.     . predniSONE (DELTASONE) 2.5 MG tablet Take 2.5 mg by mouth daily.     Marland Kitchen PREMARIN vaginal cream USE 1 GRAM VAGINALLY EVERY OTHER NIGHT (Patient taking differently: USE 1 GRAM VAGINALLY EVERY NIGHT AS NEEDED FOR IRRITATION) 30 g 0   No current facility-administered medications for this visit.         Physical Exam There were no  vitals taken for this visit. Gen:  WD/WN, NAD Skin: incision C/D/I; neuro intact     Assessment/Plan: 1. Stenosis of carotid artery, unspecified laterality Recommend:  The patient is s/p successful bilateral CEA  Continue antiplatelet therapy as prescribed Continue management of CAD, HTN and Hyperlipidemia Healthy heart diet,  encouraged exercise at least 4 times per week  Follow up in 6 months with duplex ultrasound and physical exam based on the patient's carotid surgery         [name] 03/30/2018, 8:29 AM   This note was created with Dragon medical transcription system.  Any errors from dictation are unintentional.

## 2018-03-31 ENCOUNTER — Ambulatory Visit (INDEPENDENT_AMBULATORY_CARE_PROVIDER_SITE_OTHER): Payer: Medicare Other | Admitting: Vascular Surgery

## 2018-09-08 ENCOUNTER — Other Ambulatory Visit: Payer: Self-pay | Admitting: Internal Medicine

## 2018-09-08 DIAGNOSIS — R32 Unspecified urinary incontinence: Secondary | ICD-10-CM | POA: Insufficient documentation

## 2018-09-08 DIAGNOSIS — Z1239 Encounter for other screening for malignant neoplasm of breast: Secondary | ICD-10-CM

## 2018-10-01 ENCOUNTER — Ambulatory Visit (INDEPENDENT_AMBULATORY_CARE_PROVIDER_SITE_OTHER): Payer: Medicare Other | Admitting: Vascular Surgery

## 2018-10-05 ENCOUNTER — Ambulatory Visit (INDEPENDENT_AMBULATORY_CARE_PROVIDER_SITE_OTHER): Payer: Medicare Other | Admitting: Vascular Surgery

## 2018-10-05 ENCOUNTER — Ambulatory Visit: Payer: Medicare Other | Admitting: Urology

## 2018-10-05 ENCOUNTER — Encounter: Payer: Self-pay | Admitting: Urology

## 2018-10-05 ENCOUNTER — Other Ambulatory Visit: Payer: Self-pay

## 2018-10-05 ENCOUNTER — Encounter (INDEPENDENT_AMBULATORY_CARE_PROVIDER_SITE_OTHER): Payer: Self-pay | Admitting: Vascular Surgery

## 2018-10-05 VITALS — BP 103/51 | HR 64 | Ht 65.0 in | Wt 147.0 lb

## 2018-10-05 VITALS — BP 133/70 | HR 58 | Resp 16 | Ht 65.0 in | Wt 147.0 lb

## 2018-10-05 DIAGNOSIS — I6523 Occlusion and stenosis of bilateral carotid arteries: Secondary | ICD-10-CM

## 2018-10-05 DIAGNOSIS — Z87448 Personal history of other diseases of urinary system: Secondary | ICD-10-CM

## 2018-10-05 DIAGNOSIS — N3281 Overactive bladder: Secondary | ICD-10-CM

## 2018-10-05 DIAGNOSIS — I10 Essential (primary) hypertension: Secondary | ICD-10-CM | POA: Diagnosis not present

## 2018-10-05 DIAGNOSIS — Z9889 Other specified postprocedural states: Secondary | ICD-10-CM

## 2018-10-05 DIAGNOSIS — Z7982 Long term (current) use of aspirin: Secondary | ICD-10-CM

## 2018-10-05 DIAGNOSIS — N952 Postmenopausal atrophic vaginitis: Secondary | ICD-10-CM

## 2018-10-05 DIAGNOSIS — I739 Peripheral vascular disease, unspecified: Secondary | ICD-10-CM | POA: Diagnosis not present

## 2018-10-05 DIAGNOSIS — E1159 Type 2 diabetes mellitus with other circulatory complications: Secondary | ICD-10-CM

## 2018-10-05 DIAGNOSIS — Z7902 Long term (current) use of antithrombotics/antiplatelets: Secondary | ICD-10-CM

## 2018-10-05 DIAGNOSIS — N390 Urinary tract infection, site not specified: Secondary | ICD-10-CM

## 2018-10-05 DIAGNOSIS — I6529 Occlusion and stenosis of unspecified carotid artery: Secondary | ICD-10-CM

## 2018-10-05 DIAGNOSIS — I25118 Atherosclerotic heart disease of native coronary artery with other forms of angina pectoris: Secondary | ICD-10-CM | POA: Diagnosis not present

## 2018-10-05 DIAGNOSIS — Z79899 Other long term (current) drug therapy: Secondary | ICD-10-CM

## 2018-10-05 DIAGNOSIS — Z7984 Long term (current) use of oral hypoglycemic drugs: Secondary | ICD-10-CM

## 2018-10-05 LAB — URINALYSIS, COMPLETE
Bilirubin, UA: NEGATIVE
GLUCOSE, UA: NEGATIVE
Ketones, UA: NEGATIVE
Nitrite, UA: NEGATIVE
PROTEIN UA: NEGATIVE
Specific Gravity, UA: 1.01 (ref 1.005–1.030)
Urobilinogen, Ur: 0.2 mg/dL (ref 0.2–1.0)
pH, UA: 5.5 (ref 5.0–7.5)

## 2018-10-05 LAB — MICROSCOPIC EXAMINATION
BACTERIA UA: NONE SEEN
Epithelial Cells (non renal): NONE SEEN /hpf (ref 0–10)

## 2018-10-05 MED ORDER — SULFAMETHOXAZOLE-TRIMETHOPRIM 800-160 MG PO TABS: 1.0000 | ORAL_TABLET | Freq: Two times a day (BID) | ORAL | 0 refills | Status: DC

## 2018-10-05 MED ORDER — FLUCONAZOLE 150 MG PO TABS: 150.0000 mg | ORAL_TABLET | Freq: Once | ORAL | 3 refills | Status: AC

## 2018-10-05 NOTE — Progress Notes (Signed)
10/05/2018 12:09 PM   Dawn Mcintyre [birth year] [id number]  Referring provider: Glendon Axe, MD [facility] [place], [name] [address]  Chief Complaint  Patient presents with  . Recurrent UTI    HPI: Patient is a 85 -year-old [place] American female with history of hematuria, vaginal atrophy, OAB and a history of recurrent UTI's who presents today with symptoms of an UTI.    History of hematuria  Patient completed a hematuria workup with CTU and cystoscopy in 2017 - no worrisome GU findings were discovered.  She has not had gross hematuria.   Her UA today is negative for AMH.    Vaginal atrophy Using the cream three nights weekly  OAB Stable.  History of rUTI's Risk factors: age, vaginal atrophy and incontinence + E.coli on 11/17/2017 resistant to ampicillin, ciprofloxacin and levofloxacin + E.coli on 09/02/2017 resistant to ampicillin, ciprofloxacin and levofloxacin  Today, she is complaining of a two day history of pain/dysuria at the end of her urinary stream.  She has also noticed an increase in her urge incontinence.  Patient denies any gross hematuria, dysuria or suprapubic/flank pain.  Patient denies any fevers, chills, nausea or vomiting.   CATH UA with trace leukocytes and trace blood on dip.  0-10 renal epithelial cells.   PMH: Past Medical History:  Diagnosis Date  . Arteritis ([place])   . Arthritis   . Atrophic vaginitis 11/16/2014  . Carotid artery stenosis   . Cataract   . Difficult intubation   . Environmental and seasonal allergies   . GERD (gastroesophageal reflux disease)   . Hyperlipidemia   . Hypertension   . Lack of bladder control   . Neuropathy   . Osteoporosis   . Pneumonia     x 6yrs ago  . Pre-diabetes   . Ulcer     Surgical History: Past Surgical History:  Procedure Laterality Date  . ABDOMINAL HYSTERECTOMY  1972  . BREAST CYST EXCISION Right 1972  . BREAST CYST EXCISION Left 1972  . ENDARTERECTOMY  Left 12/24/2017   Procedure: ENDARTERECTOMY CAROTID;  Surgeon: Katha Cabal, MD;  Location: [facility] ORS;  Service: Vascular;  Laterality: Left;  . ENDARTERECTOMY Right 03/13/2018   Procedure: ENDARTERECTOMY CAROTID;  Surgeon: Katha Cabal, MD;  Location: [facility] ORS;  Service: Vascular;  Laterality: Right;  . EYE SURGERY    . THROAT SURGERY     x 10 yrs ago. throat mass.  . TONSILLECTOMY      Home Medications:  Allergies as of 10/05/2018      Reactions   Amoxicillin-pot Clavulanate Other (See Comments)   Unknown reaction Has patient had a PCN reaction causing immediate rash, facial/tongue/throat swelling, SOB or lightheadedness with hypotension: Unknown Has patient had a PCN reaction causing severe rash involving mucus membranes or skin necrosis: Unknown Has patient had a PCN reaction that required hospitalization: Unknown Has patient had a PCN reaction occurring within the last 10 years: No If all of the above answers are "NO", then may proceed with Cephalosporin use.   Codeine Hives   Hydrocodone Nausea Only   Metronidazole Nausea Only   Propoxyphene Nausea Only   Latex Rash      Medication List       Accurate as of October 05, 2018 11:59 PM. Always use your most recent med list.        acetaminophen 500 MG tablet Commonly known as:  TYLENOL Take 500 mg by mouth daily as needed for  headache.   ALPRAZolam 0.25 MG tablet Commonly known as:  XANAX Take 0.25 mg by mouth at bedtime as needed for sleep.   amLODipine 5 MG tablet Commonly known as:  NORVASC TAKE 1.5 TABLETS (7.5 MG TOTAL) BY MOUTH ONCE DAILY DISCONTINUE LOSARTAN-HCTZ   aspirin 81 MG tablet Take 81 mg by mouth daily.   atorvastatin 20 MG tablet Commonly known as:  LIPITOR Take 20 mg by mouth every evening.   CRANBERRY PO Take 1 capsule by mouth daily.   felodipine 2.5 MG 24 hr tablet Commonly known as:  PLENDIL Take 2.5 mg by mouth daily. May take a second 2.5 mg dose as needed if bp is over 150/90     fexofenadine 180 MG tablet Commonly known as:  ALLEGRA Take 180 mg by mouth daily as needed for allergies.   fluconazole 150 MG tablet Commonly known as:  DIFLUCAN Take 1 tablet (150 mg total) by mouth once for 1 dose.   fluticasone 50 MCG/ACT nasal spray Commonly known as:  FLONASE Place 2 sprays into the nose daily as needed for allergies.   gabapentin 100 MG capsule Commonly known as:  NEURONTIN Take 100 mg by mouth at bedtime.   isosorbide mononitrate 60 MG 24 hr tablet Commonly known as:  IMDUR Take 60 mg by mouth daily.   losartan-hydrochlorothiazide 100-12.5 MG tablet Commonly known as:  HYZAAR Take 1 tablet by mouth daily.   metoprolol tartrate 50 MG tablet Commonly known as:  LOPRESSOR Take by mouth.   multivitamin with minerals Tabs tablet Take 1 tablet by mouth daily.   pantoprazole 40 MG tablet Commonly known as:  PROTONIX Take 40 mg by mouth daily.   predniSONE 2.5 MG tablet Commonly known as:  DELTASONE Take 2.5 mg by mouth daily.   PREMARIN vaginal cream Generic drug:  conjugated estrogens USE 1 GRAM VAGINALLY EVERY OTHER NIGHT   sulfamethoxazole-trimethoprim 800-160 MG tablet Commonly known as:  BACTRIM DS,SEPTRA DS Take 1 tablet by mouth every 12 (twelve) hours.       Allergies:  Allergies  Allergen Reactions  . Amoxicillin-Pot Clavulanate Other (See Comments)    Unknown reaction Has patient had a PCN reaction causing immediate rash, facial/tongue/throat swelling, SOB or lightheadedness with hypotension: Unknown Has patient had a PCN reaction causing severe rash involving mucus membranes or skin necrosis: Unknown Has patient had a PCN reaction that required hospitalization: Unknown Has patient had a PCN reaction occurring within the last 10 years: No If all of the above answers are "NO", then may proceed with Cephalosporin use.   . Codeine Hives  . Hydrocodone Nausea Only  . Metronidazole Nausea Only  . Propoxyphene Nausea Only  .  Latex Rash    Family History: Family History  Problem Relation Age of Onset  . Anuerysm Mother   . Cancer Father        prostrate cancer  . Diabetes Brother   . Hearing loss Brother   . Bladder Cancer Neg Hx   . Kidney cancer Neg Hx     Social History:  reports that she has never smoked. She has never used smokeless tobacco. She reports that she does not drink alcohol or use drugs.  ROS: UROLOGY Frequent Urination?: Yes Hard to postpone urination?: No Burning/pain with urination?: Yes Get up at night to urinate?: Yes Leakage of urine?: Yes Urine stream starts and stops?: No Trouble starting stream?: No Do you have to strain to urinate?: No Blood in urine?: No Urinary tract infection?: No  Sexually transmitted disease?: No Injury to kidneys or bladder?: No Painful intercourse?: No Weak stream?: No Currently pregnant?: No Vaginal bleeding?: No Last menstrual period?: n  Gastrointestinal Nausea?: No Vomiting?: No Indigestion/heartburn?: No Diarrhea?: No Constipation?: No  Constitutional Fever: No Night sweats?: No Weight loss?: No Fatigue?: No  Skin Skin rash/lesions?: No Itching?: No  Eyes Blurred vision?: No Double vision?: No  Ears/Nose/Throat Sore throat?: No Sinus problems?: No  Hematologic/Lymphatic Swollen glands?: No Easy bruising?: No  Cardiovascular Leg swelling?: No Chest pain?: No  Respiratory Cough?: No Shortness of breath?: No  Endocrine Excessive thirst?: No  Musculoskeletal Back pain?: No Joint pain?: No  Neurological Headaches?: No Dizziness?: No  Psychologic Depression?: No Anxiety?: No  Physical Exam: BP (!) 103/51 (BP Location: Left Arm, Patient Position: Sitting, Cuff Size: Normal)   Pulse 64   Ht 5\' 5"  (1.651 m)   Wt 147 lb (66.7 kg)   BMI 24.46 kg/m   Constitutional:  Well nourished. Alert and oriented, No acute distress. HEENT: [place] AT, moist mucus membranes.  Trachea midline, no  masses. Cardiovascular: No clubbing, cyanosis, or edema. Respiratory: Normal respiratory effort, no increased work of breathing. GI: Abdomen is soft, non tender, non distended, no abdominal masses. Liver and spleen not palpable.  No hernias appreciated.  Stool sample for occult testing is not indicated.   GU: No CVA tenderness.  No bladder fullness or masses.  Atrophic external genitalia, sparse pubic hair distribution, no lesions.  Normal urethral meatus, no lesions, no prolapse, no discharge.   No urethral masses, tenderness and/or tenderness. No bladder fullness, tenderness or masses. Pale vagina mucosa, good estrogen effect, no discharge, no lesions, good pelvic support, no cystocele and no rectocele noted.  Cervix, uterus and adnexa are surgically absent.  No adnexal/parametria masses or tenderness noted.  Anus and perineum are without rashes or lesions.    Skin: No rashes, bruises or suspicious lesions. Lymph: No cervical or inguinal adenopathy. Neurologic: Grossly intact, no focal deficits, moving all 4 extremities. Psychiatric: Normal mood and affect.   Laboratory Data:  Assessment & Plan:    1. History of hematuria   - Hematuria workup completed in 2017, no worrisome findings  -  No reports of gross hematuria  - no AMH on today's UA  2. Vaginal atrophy  - continue vaginal estrogen cream tid weekly  - RTC in 12 months for exam  3. OAB  - symptoms increased recently - urine culture pending  - will reassess once culture results are available   4. UTI  - CATH UA is bland - will send for culture - will start Septra today as patient is symptomatic - will adjust if necessary once culture is available -    Return for pending urine culture results .  These notes generated with voice recognition software. I apologize for typographical errors.  Zara Council, PA-C  [facility] [address] [place], [place] [address] [phone]

## 2018-10-05 NOTE — Progress Notes (Signed)
MRN : [id number]  Dawn Mcintyre is a 85 y.o. (11-10-1932) female who presents with chief complaint of No chief complaint on file. Marland Kitchen  History of Present Illness:   The patient is seen for follow up evaluation of carotid stenosis status post right carotid endarterectomy on 03/13/2018.  There were no post operative problems or complications related to the surgery.  The patient denies neck or incisional pain.  Right carotid endarterectomy was treated with primary closure  The patient denies interval amaurosis fugax. There is no recent history of TIA symptoms or focal motor deficits. There is no prior documented CVA.  The patient denies headache.  The patient is taking enteric-coated aspirin 81 mg daily.  The patient has a history of coronary artery disease, no recent episodes of angina or shortness of breath. The patient denies PAD or claudication symptoms. There is a history of hyperlipidemia which is being treated with a statin.    No outpatient medications have been marked as taking for the 10/05/18 encounter (Appointment) with Delana Meyer, Dolores Lory, MD.    Past Medical History:  Diagnosis Date  . Arteritis ([place])   . Arthritis   . Atrophic vaginitis 11/16/2014  . Carotid artery stenosis   . Cataract   . Difficult intubation   . Environmental and seasonal allergies   . GERD (gastroesophageal reflux disease)   . Hyperlipidemia   . Hypertension   . Lack of bladder control   . Neuropathy   . Osteoporosis   . Pneumonia     x 17yrs ago  . Pre-diabetes   . Ulcer     Past Surgical History:  Procedure Laterality Date  . ABDOMINAL HYSTERECTOMY  1972  . BREAST CYST EXCISION Right 1972  . BREAST CYST EXCISION Left 1972  . ENDARTERECTOMY Left 12/24/2017   Procedure: ENDARTERECTOMY CAROTID;  Surgeon: Katha Cabal, MD;  Location: [facility] ORS;  Service: Vascular;  Laterality: Left;  . ENDARTERECTOMY Right 03/13/2018   Procedure: ENDARTERECTOMY CAROTID;  Surgeon: Katha Cabal,  MD;  Location: [facility] ORS;  Service: Vascular;  Laterality: Right;  . EYE SURGERY    . THROAT SURGERY     x 10 yrs ago. throat mass.  . TONSILLECTOMY      Social History Social History   Tobacco Use  . Smoking status: Never Smoker  . Smokeless tobacco: Never Used  Substance Use Topics  . Alcohol use: No    Alcohol/week: 0.0 standard drinks  . Drug use: No    Family History Family History  Problem Relation Age of Onset  . Anuerysm Mother   . Cancer Father        prostrate cancer  . Diabetes Brother   . Hearing loss Brother   . Bladder Cancer Neg Hx   . Kidney cancer Neg Hx     Allergies  Allergen Reactions  . Amoxicillin-Pot Clavulanate Other (See Comments)    Unknown reaction Has patient had a PCN reaction causing immediate rash, facial/tongue/throat swelling, SOB or lightheadedness with hypotension: Unknown Has patient had a PCN reaction causing severe rash involving mucus membranes or skin necrosis: Unknown Has patient had a PCN reaction that required hospitalization: Unknown Has patient had a PCN reaction occurring within the last 10 years: No If all of the above answers are "NO", then may proceed with Cephalosporin use.   . Codeine Hives  . Hydrocodone Nausea Only  . Metronidazole Nausea Only  . Propoxyphene Nausea Only  . Latex Rash  REVIEW OF SYSTEMS (Negative unless checked)  Constitutional: [] Weight loss  [] Fever  [] Chills Cardiac: [] Chest pain   [] Chest pressure   [] Palpitations   [] Shortness of breath when laying flat   [] Shortness of breath with exertion. Vascular:  [x] Pain in legs with walking   [] Pain in legs at rest  [] History of DVT   [] Phlebitis   [] Swelling in legs   [] Varicose veins   [] Non-healing ulcers Pulmonary:   [] Uses home oxygen   [] Productive cough   [] Hemoptysis   [] Wheeze  [] COPD   [] Asthma Neurologic:  [] Dizziness   [] Seizures   [] History of stroke   [] History of TIA  [] Aphasia   [] Vissual changes   [] Weakness or numbness in arm    [x] Weakness or numbness in leg Musculoskeletal:   [] Joint swelling   [x] Joint pain   [x] Low back pain Hematologic:  [] Easy bruising  [] Easy bleeding   [] Hypercoagulable state   [] Anemic Gastrointestinal:  [] Diarrhea   [] Vomiting  [] Gastroesophageal reflux/heartburn   [] Difficulty swallowing. Genitourinary:  [] Chronic kidney disease   [] Difficult urination  [] Frequent urination   [] Blood in urine Skin:  [] Rashes   [] Ulcers  Psychological:  [] History of anxiety   []  History of major depression.  Physical Examination  There were no vitals filed for this visit. There is no height or weight on file to calculate BMI. Gen: WD/WN, NAD Head: [place]/AT, No temporalis wasting.  Ear/Nose/Throat: Hearing grossly intact, nares w/o erythema or drainage Eyes: PER, EOMI, sclera nonicteric.  Neck: Supple, no large masses.   Pulmonary:  Good air movement, no audible wheezing bilaterally, no use of accessory muscles.  Cardiac: RRR, no JVD Vascular: well healed bilateral carotid endarterectomy scars, no carotid bruits Vessel Right Left  Radial Palpable Palpable  Carotid Palpable Palpable  PT Not Palpable Not Palpable  DP Not Palpable Not Palpable  Gastrointestinal: Non-distended. No guarding/no peritoneal signs.  Musculoskeletal: M/S 5/5 throughout.  No deformity or atrophy.  Neurologic: CN 2-12 intact. Symmetrical.  Speech is fluent. Motor exam as listed above. Psychiatric: Judgment intact, Mood & affect appropriate for pt's clinical situation. Dermatologic: No rashes or ulcers noted.  No changes consistent with cellulitis. Lymph : No lichenification or skin changes of chronic lymphedema.  CBC Lab Results  Component Value Date   WBC 9.6 03/14/2018   HGB 10.7 (L) 03/14/2018   HCT 31.3 (L) 03/14/2018   MCV 92.9 03/14/2018   PLT 179 03/14/2018    BMET    Component Value Date/Time   NA 141 03/14/2018 0451   NA 139 09/12/2011 0250   K 4.0 03/14/2018 0451   K 3.5 09/12/2011 0250   CL 107  03/14/2018 0451   CL 102 09/12/2011 0250   CO2 27 03/14/2018 0451   CO2 23 09/12/2011 0250   GLUCOSE 125 (H) 03/14/2018 0451   GLUCOSE 162 (H) 09/12/2011 0250   BUN 15 03/14/2018 0451   BUN 20 05/30/2016 1358   BUN 23 (H) 09/12/2011 0250   CREATININE 0.87 03/14/2018 0451   CREATININE 0.94 09/12/2011 0250   CALCIUM 9.0 03/14/2018 0451   CALCIUM 9.1 09/12/2011 0250   GFRNONAA 60 (L) 03/14/2018 0451   GFRNONAA >60 09/12/2011 0250   GFRAA >60 03/14/2018 0451   GFRAA >60 09/12/2011 0250   CrCl cannot be calculated (Patient's most recent lab result is older than the maximum 21 days allowed.).  COAG Lab Results  Component Value Date   INR 0.99 03/05/2018   INR 1.02 12/16/2017    Radiology No results found.  Assessment/Plan 1. Stenosis of carotid artery, unspecified laterality Recommend:  Given the patient's asymptomatic subcritical stenosis no further invasive testing or surgery at this time.  Previous duplex ultrasound shows <50% stenosis bilaterally.  Continue antiplatelet therapy as prescribed Continue management of CAD, HTN and Hyperlipidemia Healthy heart diet,  encouraged exercise at least 4 times per week  Follow up in 6 months with duplex ultrasound and physical exam   - VAS US CAROTID; Future  2. PAD (peripheral artery disease) (HCC)  Recommend:  The patient has evidence of atherosclerosis of the lower extremities with claudication.  The patient does not voice lifestyle limiting changes at this point in time.  Noninvasive studies do not suggest clinically significant change.  No invasive studies, angiography or surgery at this time The patient should continue walking and begin a more formal exercise program.  The patient should continue antiplatelet therapy and aggressive treatment of the lipid abnormalities  No changes in the patient's medications at this time  The patient should continue wearing graduated compression socks 10-15 mmHg strength to  control the mild edema.   - VAS [place] ABI WITH/WO TBI; Future  3. Essential hypertension Continue antihypertensive medications as already ordered, these medications have been reviewed and there are no changes at this time.   4. Coronary artery disease of native artery of native heart with stable angina pectoris (HCC) Continue cardiac and antihypertensive medications as already ordered and reviewed, no changes at this time.  Continue statin as ordered and reviewed, no changes at this time  Nitrates PRN for chest pain   5. Type 2 diabetes mellitus with other circulatory complication, without long-term current use of insulin (HCC) Continue hypoglycemic medications as already ordered, these medications have been reviewed and there are no changes at this time.  Hgb A1C to be monitored as already arranged by primary service  [name], MD  10/05/2018 10:06 AM

## 2018-10-07 ENCOUNTER — Ambulatory Visit
Admission: RE | Admit: 2018-10-07 | Discharge: 2018-10-07 | Disposition: A | Discharge status: Home / Self Care | Payer: Medicare Other | Source: Ambulatory Visit | Attending: Internal Medicine | Admitting: Internal Medicine

## 2018-10-07 DIAGNOSIS — Z1239 Encounter for other screening for malignant neoplasm of breast: Secondary | ICD-10-CM

## 2018-10-07 DIAGNOSIS — Z1231 Encounter for screening mammogram for malignant neoplasm of breast: Secondary | ICD-10-CM | POA: Diagnosis not present

## 2018-10-08 LAB — CULTURE, URINE COMPREHENSIVE

## 2018-10-09 ENCOUNTER — Telehealth: Payer: Self-pay

## 2018-10-09 NOTE — Telephone Encounter (Signed)
Advised patient of results. States she is feeling better. Will continue the entire course of antibiotics and will call back to make an appt for next year.

## 2018-10-09 NOTE — Telephone Encounter (Signed)
-----   Message from Nori Riis, PA-C sent at 10/08/2018  4:48 PM EST ----- Please let Dawn Mcintyre know that her urine culture is positive for infection and the Septra that was prescribed for her is the appropriate antibiotic.  She should be feeling better.  We do need to see her in one year.

## 2018-12-17 DIAGNOSIS — E1140 Type 2 diabetes mellitus with diabetic neuropathy, unspecified: Secondary | ICD-10-CM | POA: Insufficient documentation

## 2019-02-02 ENCOUNTER — Other Ambulatory Visit: Payer: Self-pay | Admitting: Internal Medicine

## 2019-02-02 DIAGNOSIS — R1013 Epigastric pain: Secondary | ICD-10-CM

## 2019-02-02 DIAGNOSIS — M7061 Trochanteric bursitis, right hip: Secondary | ICD-10-CM | POA: Insufficient documentation

## 2019-02-08 ENCOUNTER — Other Ambulatory Visit: Payer: Self-pay

## 2019-02-08 ENCOUNTER — Ambulatory Visit
Admission: RE | Admit: 2019-02-08 | Discharge: 2019-02-08 | Disposition: A | Discharge status: Home / Self Care | Payer: Medicare Other | Source: Ambulatory Visit | Attending: Internal Medicine | Admitting: Internal Medicine

## 2019-02-08 DIAGNOSIS — R1013 Epigastric pain: Secondary | ICD-10-CM

## 2019-02-16 ENCOUNTER — Other Ambulatory Visit: Payer: Self-pay

## 2019-02-16 ENCOUNTER — Ambulatory Visit
Admission: RE | Admit: 2019-02-16 | Discharge: 2019-02-16 | Disposition: A | Discharge status: Home / Self Care | Payer: Medicare Other | Source: Ambulatory Visit | Attending: Internal Medicine | Admitting: Internal Medicine

## 2019-02-16 DIAGNOSIS — R1013 Epigastric pain: Secondary | ICD-10-CM | POA: Insufficient documentation

## 2019-02-22 ENCOUNTER — Encounter (INDEPENDENT_AMBULATORY_CARE_PROVIDER_SITE_OTHER): Payer: Medicare Other

## 2019-02-22 ENCOUNTER — Ambulatory Visit (INDEPENDENT_AMBULATORY_CARE_PROVIDER_SITE_OTHER): Payer: Medicare Other | Admitting: Vascular Surgery

## 2019-03-05 ENCOUNTER — Other Ambulatory Visit: Payer: Self-pay | Admitting: Family Medicine

## 2019-03-05 DIAGNOSIS — R519 Headache, unspecified: Secondary | ICD-10-CM

## 2019-03-11 ENCOUNTER — Ambulatory Visit
Admission: RE | Admit: 2019-03-11 | Discharge: 2019-03-11 | Disposition: A | Discharge status: Home / Self Care | Payer: Medicare Other | Source: Ambulatory Visit | Attending: Family Medicine | Admitting: Family Medicine

## 2019-03-11 ENCOUNTER — Other Ambulatory Visit: Payer: Self-pay

## 2019-03-11 DIAGNOSIS — R519 Headache, unspecified: Secondary | ICD-10-CM

## 2019-03-11 DIAGNOSIS — R51 Headache: Secondary | ICD-10-CM | POA: Diagnosis not present

## 2019-03-14 ENCOUNTER — Other Ambulatory Visit: Payer: Self-pay

## 2019-03-14 ENCOUNTER — Emergency Department
Admission: EM | Admit: 2019-03-14 | Discharge: 2019-03-14 | Disposition: A | Discharge status: Home / Self Care | Payer: Medicare Other | Attending: Emergency Medicine | Admitting: Emergency Medicine

## 2019-03-14 DIAGNOSIS — I10 Essential (primary) hypertension: Secondary | ICD-10-CM

## 2019-03-14 DIAGNOSIS — Z7982 Long term (current) use of aspirin: Secondary | ICD-10-CM | POA: Insufficient documentation

## 2019-03-14 DIAGNOSIS — N183 Chronic kidney disease, stage 3 (moderate): Secondary | ICD-10-CM | POA: Insufficient documentation

## 2019-03-14 DIAGNOSIS — I129 Hypertensive chronic kidney disease with stage 1 through stage 4 chronic kidney disease, or unspecified chronic kidney disease: Secondary | ICD-10-CM | POA: Insufficient documentation

## 2019-03-14 DIAGNOSIS — Z79899 Other long term (current) drug therapy: Secondary | ICD-10-CM | POA: Insufficient documentation

## 2019-03-14 DIAGNOSIS — Z91040 Latex allergy status: Secondary | ICD-10-CM | POA: Insufficient documentation

## 2019-03-14 DIAGNOSIS — E119 Type 2 diabetes mellitus without complications: Secondary | ICD-10-CM | POA: Insufficient documentation

## 2019-03-14 LAB — BASIC METABOLIC PANEL
Anion gap: 9 (ref 5–15)
BUN: 23 mg/dL (ref 8–23)
CO2: 26 mmol/L (ref 22–32)
Calcium: 10 mg/dL (ref 8.9–10.3)
Chloride: 106 mmol/L (ref 98–111)
Creatinine, Ser: 1.08 mg/dL — ABNORMAL HIGH (ref 0.44–1.00)
GFR calc Af Amer: 55 mL/min — ABNORMAL LOW (ref >60)
GFR calc non Af Amer: 47 mL/min — ABNORMAL LOW (ref >60)
Glucose, Bld: 118 mg/dL — ABNORMAL HIGH (ref 70–99)
Potassium: 3.4 mmol/L — ABNORMAL LOW (ref 3.5–5.1)
Sodium: 141 mmol/L (ref 135–145)

## 2019-03-14 LAB — CBC WITH DIFFERENTIAL/PLATELET
Abs Immature Granulocytes: 0.04 10*3/uL (ref 0.00–0.07)
Basophils Absolute: 0 10*3/uL (ref 0.0–0.1)
Basophils Relative: 1 %
Eosinophils Absolute: 0.1 10*3/uL (ref 0.0–0.5)
Eosinophils Relative: 1 %
HCT: 38.7 % (ref 36.0–46.0)
Hemoglobin: 13 g/dL (ref 12.0–15.0)
Immature Granulocytes: 1 %
Lymphocytes Relative: 22 %
Lymphs Abs: 1.7 10*3/uL (ref 0.7–4.0)
MCH: 31.6 pg (ref 26.0–34.0)
MCHC: 33.6 g/dL (ref 30.0–36.0)
MCV: 93.9 fL (ref 80.0–100.0)
Monocytes Absolute: 0.7 10*3/uL (ref 0.1–1.0)
Monocytes Relative: 9 %
Neutro Abs: 5.3 10*3/uL (ref 1.7–7.7)
Neutrophils Relative %: 66 %
Platelets: 226 10*3/uL (ref 150–400)
RBC: 4.12 MIL/uL (ref 3.87–5.11)
RDW: 12.8 % (ref 11.5–15.5)
WBC: 7.8 10*3/uL (ref 4.0–10.5)
nRBC: 0 % (ref 0.0–0.2)

## 2019-03-14 NOTE — ED Provider Notes (Signed)
[facility] Emergency Department Provider Note  ____________________________________________   First MD Initiated Contact with Patient 03/14/19 0125     (approximate)  I have reviewed the triage vital signs and the nursing notes.   HISTORY  Chief Complaint Hypertension    HPI Dawn Mcintyre is a 85 y.o. female with medical history as listed below which notably includes essential hypertension on 5 or 6 different antihypertensive agents and who sees Dr. Clayborn Bigness.  She presents tonight by EMS for evaluation of hypertension.  She reports that she took her usual 3 evening blood pressure medicines  around 6 PM.  She was getting ready for bed just before 11 and checked her blood pressure and at first she was concerned because it was too low for her, around 492 systolic.  She checked it repeatedly over the next couple of hours and "it kept going up and up".  As it did so, she reports feeling "a little bit panicky" because her mother died of a brain aneurysm.  The patient reports that as her blood pressure went up and she worried about her blood pressure more and more, she started feeling a fullness or heaviness in the front of her head that she associates with high blood pressure.  She has had no other symptoms.  Specifically she denies being around COVID-19 patients and she denies sore throat, chest pain, shortness of breath, cough, nausea, vomiting, abdominal pain, and dysuria.  She is very active and reports that she is typically up early and works all day at her activities.  She has not had a fall and does not feel like she is going to pass out.  She has no headache or neck pain.  She reports that the blood pressure for her is severe and went from being extremely low to being as high as a systolic of over 010.  She currently feels okay, just reports a mild pressure in the front of her head.        Past Medical History:  Diagnosis Date  . Arteritis ([place])   . Arthritis    . Atrophic vaginitis 11/16/2014  . Carotid artery stenosis   . Cataract   . Difficult intubation   . Environmental and seasonal allergies   . GERD (gastroesophageal reflux disease)   . Hyperlipidemia   . Hypertension   . Lack of bladder control   . Neuropathy   . Osteoporosis   . Pneumonia     x 40yrs ago  . Pre-diabetes   . Ulcer     Patient Active Problem List   Diagnosis Date Noted  . PAD (peripheral artery disease) ([place]) 10/05/2018  . Carotid stenosis, asymptomatic, right 03/13/2018  . Carotid stenosis, bilateral 12/24/2017  . Syncope and collapse 11/15/2017  . CAD (coronary artery disease) 11/15/2017  . Leg pain 10/26/2017  . Carotid stenosis 07/01/2016  . Hyperlipidemia 07/01/2016  . Peripheral sensory neuropathy ([place]) 03/25/2016  . Type 2 diabetes mellitus with stage 3 chronic kidney disease, without long-term current use of insulin ([place]) 03/25/2016  . Anxiety 11/24/2015  . Sensory peripheral neuropathy ([place]) 10/30/2015  . Influenza 10/15/2015  . CKD (chronic kidney disease) stage 3, GFR 30-59 ml/min (HCC) 07/19/2015  . Osteopenia 07/19/2015  . Chronic insomnia 01/25/2015  . Diet-controlled type 2 diabetes mellitus ([place]) 01/25/2015  . Gastroesophageal reflux disease without esophagitis 01/25/2015  . Mixed stress and urge urinary incontinence 01/25/2015  . Atrophic vaginitis 11/16/2014  . Vitamin B12 deficiency 10/26/2014  . Abdominal  pain, chronic, epigastric 06/15/2014  . Essential hypertension 04/28/2014  . Sciatica of left side 04/07/2014  . HTN (hypertension) 01/28/2014  . Type 2 diabetes mellitus ([place]) 01/28/2014    Past Surgical History:  Procedure Laterality Date  . ABDOMINAL HYSTERECTOMY  1972  . BREAST CYST EXCISION Right 1972  . BREAST CYST EXCISION Left 1972  . ENDARTERECTOMY Left 12/24/2017   Procedure: ENDARTERECTOMY CAROTID;  Surgeon: Katha Cabal, MD;  Location: [facility] ORS;  Service: Vascular;  Laterality: Left;  . ENDARTERECTOMY Right  03/13/2018   Procedure: ENDARTERECTOMY CAROTID;  Surgeon: Katha Cabal, MD;  Location: [facility] ORS;  Service: Vascular;  Laterality: Right;  . EYE SURGERY    . THROAT SURGERY     x 10 yrs ago. throat mass.  . TONSILLECTOMY      Prior to Admission medications   Medication Sig Start Date End Date Taking? Authorizing Provider  acetaminophen (TYLENOL) 500 MG tablet Take 500 mg by mouth daily as needed for headache.    [provider]  ALPRAZolam Duanne Moron) 0.25 MG tablet Take 0.25 mg by mouth at bedtime as needed for sleep.     [provider]  amLODipine (NORVASC) 5 MG tablet TAKE 1.5 TABLETS (7.5 MG TOTAL) BY MOUTH ONCE DAILY DISCONTINUE LOSARTAN-HCTZ 03/05/18   [provider]  aspirin 81 MG tablet Take 81 mg by mouth daily.      [provider]  atorvastatin (LIPITOR) 20 MG tablet Take 20 mg by mouth every evening.  06/25/16 03/05/18  [provider]  CRANBERRY PO Take 1 capsule by mouth daily.    [provider]  felodipine (PLENDIL) 2.5 MG 24 hr tablet Take 2.5 mg by mouth daily. May take a second 2.5 mg dose as needed if bp is over 150/90    [provider]  fexofenadine (ALLEGRA) 180 MG tablet Take 180 mg by mouth daily as needed for allergies.     [provider]  fluticasone (FLONASE) 50 MCG/ACT nasal spray Place 2 sprays into the nose daily as needed for allergies.  02/19/15 03/05/18  [provider]  gabapentin (NEURONTIN) 100 MG capsule Take 100 mg by mouth at bedtime.     [provider]  isosorbide mononitrate (IMDUR) 60 MG 24 hr tablet Take 60 mg by mouth daily.      [provider]  losartan-hydrochlorothiazide (HYZAAR) 100-12.5 MG tablet Take 1 tablet by mouth daily.  05/19/17 05/19/18  [provider]  metoprolol tartrate (LOPRESSOR) 50 MG tablet Take by mouth. 03/17/18   [provider]  Multiple Vitamin (MULTIVITAMIN WITH MINERALS) TABS tablet Take 1 tablet by mouth  daily.    [provider]  pantoprazole (PROTONIX) 40 MG tablet Take 40 mg by mouth daily.  04/28/15   [provider]  predniSONE (DELTASONE) 2.5 MG tablet Take 2.5 mg by mouth daily.     [provider]  PREMARIN vaginal cream USE 1 GRAM VAGINALLY EVERY OTHER NIGHT Patient taking differently: USE 1 GRAM VAGINALLY EVERY NIGHT AS NEEDED FOR IRRITATION 01/01/18   Tamala Julian, [place], CNM  sulfamethoxazole-trimethoprim (BACTRIM DS,SEPTRA DS) 800-160 MG tablet Take 1 tablet by mouth every 12 (twelve) hours. 10/05/18   Zara Council A, PA-C    Allergies Amoxicillin-pot clavulanate, Codeine, Hydrocodone, Metronidazole, Propoxyphene, and Latex  Family History  Problem Relation Age of Onset  . Anuerysm Mother   . Cancer Father        prostrate cancer  . Diabetes Brother   . Hearing loss  Brother   . Bladder Cancer Neg Hx   . Kidney cancer Neg Hx     Social History Social History   Tobacco Use  . Smoking status: Never Smoker  . Smokeless tobacco: Never Used  Substance Use Topics  . Alcohol use: No    Alcohol/week: 0.0 standard drinks  . Drug use: No    Review of Systems Constitutional: Hypertension.  No fever/chills Eyes: No visual changes. ENT: No sore throat. Cardiovascular: Denies chest pain. Respiratory: Denies shortness of breath. Gastrointestinal: No abdominal pain.  No nausea, no vomiting.  No diarrhea.  No constipation. Genitourinary: Negative for dysuria. Musculoskeletal: Negative for neck pain.  Negative for back pain. Integumentary: Negative for rash. Neurological: Negative for headaches, focal weakness or numbness.   ____________________________________________   PHYSICAL EXAM:  VITAL SIGNS: ED Triage Vitals  Enc Vitals Group     BP 03/14/19 0120 (!) 180/61     Pulse Rate 03/14/19 0120 66     Resp 03/14/19 0120 20     Temp 03/14/19 0120 98.6 F (37 C)     Temp Source 03/14/19 0120 Oral     SpO2 03/14/19 0120 95 %     Weight  03/14/19 0116 64 kg (141 lb)     Height 03/14/19 0116 1.702 m (5\' 7" )     Head Circumference --      Peak Flow --      Pain Score 03/14/19 0116 4     Pain Loc --      Pain Edu? --      Excl. in [place]? --     Constitutional: Alert and oriented.  Well-appearing and in no distress. Eyes: Conjunctivae are normal.  Pupils are equal and reactive bilaterally. Head: Atraumatic. Nose: No congestion/rhinnorhea. Mouth/Throat: Mucous membranes are moist. Neck: No stridor.  No meningeal signs.   Cardiovascular: Normal rate, regular rhythm. Good peripheral circulation. Grossly normal heart sounds. Respiratory: Normal respiratory effort.  No retractions. Gastrointestinal: Soft and nontender. No distention.  Musculoskeletal: No lower extremity tenderness nor edema. No gross deformities of extremities. Neurologic:  Normal speech and language. No gross focal neurologic deficits are appreciated.  Skin:  Skin is warm, dry and intact. Psychiatric: Mood and affect are a little bit anxious about her medical condition but generally appropriate.  No emergent psychiatric signs or symptoms.  ____________________________________________   LABS (all labs ordered are listed, but only abnormal results are displayed)  Labs Reviewed  BASIC METABOLIC PANEL - Abnormal; Notable for the following components:      Result Value   Potassium 3.4 (*)    Glucose, Bld 118 (*)    Creatinine, Ser 1.08 (*)    GFR calc non Af Amer 47 (*)    GFR calc Af Amer 55 (*)    All other components within normal limits  CBC WITH DIFFERENTIAL/PLATELET   ____________________________________________  EKG  ED ECG REPORT I, Hinda Kehr, the attending physician, personally viewed and interpreted this ECG.  Date: 03/14/2019 EKG Time: 1:20 AM Rate: 66 Rhythm: normal sinus rhythm QRS Axis: normal Intervals: normal with LVH ST/T Wave abnormalities: normal Narrative Interpretation: no evidence of acute ischemia   ____________________________________________  RADIOLOGY I, Hinda Kehr, personally viewed and evaluated these images (plain radiographs) as part of my medical decision making, as well as reviewing the written report by the radiologist.  ED MD interpretation: No indication for emergent imaging  Official radiology report(s): No results found.  ____________________________________________   PROCEDURES   Procedure(s) performed (including Critical  Care):  Procedures   ____________________________________________   INITIAL IMPRESSION / MDM / ASSESSMENT AND PLAN / ED COURSE  As part of my medical decision making, I reviewed the following data within the [place] notes reviewed and incorporated, Labs reviewed , EKG interpreted , Old chart reviewed and Notes from prior ED visits   Differential diagnosis includes, but is not limited to, essential hypertension, anxiety about health and over what she considered to be severely abnormal blood pressure readings, panic attack, acute intracranial hemorrhage, much less likely ACS or CVA.  The patient has stable vital signs currently although she remains hypertensive with a systolic blood pressure in the 170s 180s.  She is otherwise very well-appearing and in no distress and reporting no other symptoms other than some pressure in the front of her head.  It is notable, I believe, that she reports that initially her blood pressure was very low, around 811 systolic which is low for her, and then elevated every time she checked it for the next couple of hours.  I had my usual and customary essential hypertension discussion with the patient and explained that in the absence of emergent signs or symptoms, it would be better for me not to intervene pharmacologically given how variable her blood pressure has been tonight.  I explained that her lab work is reassuring including her metabolic panel with no sign of endorgan dysfunction.   There is no indication to check a high-sensitivity troponin given that there is no chest pain or other symptoms.  She is comfortable with the plan to relax in her exam room for a little while and we will recheck and see if her blood pressures come down.  If worse comes to worse, I will give her an extra smaller dose of metoprolol or possibly even a clonidine 0.1 mg p.o., but I strongly feel it would be better to not treat at this time and have her follow-up with Dr. Clayborn Bigness.  She states that she understands and agrees.      Clinical Course as of Mar 13 344  Sun Mar 14, 2019  0344 The patient says she feels better and currently has no symptoms.  She says she is ready to go home.  Her blood pressure is still about 914 systolic but it is asymptomatic and I will not intervene at this time.  I reiterated my usual and customary return precautions and she understands and agrees with the plan.   [CF]    Clinical Course User Index [CF] Hinda Kehr, MD     ____________________________________________  FINAL CLINICAL IMPRESSION(S) / ED DIAGNOSES  Final diagnoses:  Essential hypertension     MEDICATIONS GIVEN DURING THIS VISIT:  Medications - No data to display   ED Discharge Orders    None      *Please note:  Dalylah Ramey Vacha was evaluated in Emergency Department on 03/14/2019 for the symptoms described in the history of present illness. She was evaluated in the context of the global COVID-19 pandemic, which necessitated consideration that the patient might be at risk for infection with the SARS-CoV-2 virus that causes COVID-19. Institutional protocols and algorithms that pertain to the evaluation of patients at risk for COVID-19 are in a state of rapid change based on information released by regulatory bodies including the CDC and federal and state organizations. These policies and algorithms were followed during the patient's care in the ED.  Some ED evaluations and interventions may be  delayed as a  result of limited staffing during the pandemic.*  Note:  This document was prepared using Dragon voice recognition software and may include unintentional dictation errors.   Hinda Kehr, MD 03/14/19 [phone]

## 2019-03-14 NOTE — ED Triage Notes (Signed)
Patient to [facility] 13 via EMS from home.  Per EMS patient was concerned about her blood pressure going up over 2 hour period and having pressure in her head.

## 2019-03-14 NOTE — Discharge Instructions (Signed)

## 2019-04-06 ENCOUNTER — Ambulatory Visit (INDEPENDENT_AMBULATORY_CARE_PROVIDER_SITE_OTHER): Payer: Medicare Other | Admitting: Physician Assistant

## 2019-04-06 ENCOUNTER — Encounter: Payer: Self-pay | Admitting: Physician Assistant

## 2019-04-06 ENCOUNTER — Other Ambulatory Visit: Payer: Self-pay

## 2019-04-06 VITALS — BP 152/72 | HR 60 | Ht 66.0 in | Wt 142.0 lb

## 2019-04-06 DIAGNOSIS — N952 Postmenopausal atrophic vaginitis: Secondary | ICD-10-CM | POA: Diagnosis not present

## 2019-04-06 DIAGNOSIS — R300 Dysuria: Secondary | ICD-10-CM

## 2019-04-06 DIAGNOSIS — R350 Frequency of micturition: Secondary | ICD-10-CM

## 2019-04-06 DIAGNOSIS — Z87440 Personal history of urinary (tract) infections: Secondary | ICD-10-CM

## 2019-04-06 LAB — MICROSCOPIC EXAMINATION: Bacteria, UA: NONE SEEN

## 2019-04-06 LAB — URINALYSIS, COMPLETE
Bilirubin, UA: NEGATIVE
Glucose, UA: NEGATIVE
Ketones, UA: NEGATIVE
Nitrite, UA: NEGATIVE
Protein,UA: NEGATIVE
RBC, UA: NEGATIVE
Specific Gravity, UA: 1.015 (ref 1.005–1.030)
Urobilinogen, Ur: 0.2 mg/dL (ref 0.2–1.0)
pH, UA: 6 (ref 5.0–7.5)

## 2019-04-06 LAB — BLADDER SCAN AMB NON-IMAGING: Scan Result: 23

## 2019-04-06 MED ORDER — PREMARIN 0.625 MG/GM VA CREA: TOPICAL_CREAM | VAGINAL | 0 refills | Status: DC

## 2019-04-06 NOTE — Progress Notes (Signed)
04/06/2019 7:56 AM   Dawn Mcintyre 06-Dec-1932 [id number]  CC: Urinary frequency  HPI: Dawn Mcintyre is a 85 y.o. female who presents today for evaluation of possible UTI. She is an established [facility] patient who last saw Zara Council on 10/05/2018 for possible UTI with PMH recurrent UTI, OAB, atrophic vaginitis, and microscopic hematuria with negative work-up in 2017.  She reports an approximate 4 week history of lower abdominal stinging when she urinates. She denies flank pain, gross hematuria, melena, hematochezia, vaginal discharge, and vaginal bleeding. She does have urgency and frequency at baseline. She has taken acetaminophen at home with some symptom palliation.  She does have a history of recurrent UTI, most recently on 10/05/2018 and treated with Bactrim. She is not on daily prophylaxis.  She uses Premarin cream 3 times weekly but just ran out of this. She has previously been on Myrbetriq but forgot to take it for "quite a while," having resumed it approximately one week ago. She also reports worsened constipation over the past 3-4 weeks.  In-office UA today positive for trace leukocyte esterase; urine microscopy pan-negative. PVR 25mL.  PMH: Past Medical History:  Diagnosis Date  . Arteritis ([place])   . Arthritis   . Atrophic vaginitis 11/16/2014  . Carotid artery stenosis   . Cataract   . Difficult intubation   . Environmental and seasonal allergies   . GERD (gastroesophageal reflux disease)   . Hyperlipidemia   . Hypertension   . Lack of bladder control   . Neuropathy   . Osteoporosis   . Pneumonia     x 11yrs ago  . Pre-diabetes   . Ulcer     Surgical History: Past Surgical History:  Procedure Laterality Date  . ABDOMINAL HYSTERECTOMY  1972  . BREAST CYST EXCISION Right 1972  . BREAST CYST EXCISION Left 1972  . ENDARTERECTOMY Left 12/24/2017   Procedure: ENDARTERECTOMY CAROTID;  Surgeon: Katha Cabal, MD;  Location: [facility] ORS;  Service: Vascular;   Laterality: Left;  . ENDARTERECTOMY Right 03/13/2018   Procedure: ENDARTERECTOMY CAROTID;  Surgeon: Katha Cabal, MD;  Location: [facility] ORS;  Service: Vascular;  Laterality: Right;  . EYE SURGERY    . THROAT SURGERY     x 10 yrs ago. throat mass.  . TONSILLECTOMY      Home Medications:  Allergies as of 04/06/2019      Reactions   Amoxicillin-pot Clavulanate Other (See Comments)   Unknown reaction Has patient had a PCN reaction causing immediate rash, facial/tongue/throat swelling, SOB or lightheadedness with hypotension: Unknown Has patient had a PCN reaction causing severe rash involving mucus membranes or skin necrosis: Unknown Has patient had a PCN reaction that required hospitalization: Unknown Has patient had a PCN reaction occurring within the last 10 years: No If all of the above answers are "NO", then may proceed with Cephalosporin use.   Codeine Hives   Hydrocodone Nausea Only   Metronidazole Nausea Only   Propoxyphene Nausea Only   Latex Rash      Medication List       Accurate as of April 06, 2019 11:59 PM. If you have any questions, ask your nurse or doctor.        acetaminophen 500 MG tablet Commonly known as: TYLENOL Take 500 mg by mouth daily as needed for headache.   ALPRAZolam 0.25 MG tablet Commonly known as: XANAX Take 0.25 mg by mouth at bedtime as needed for sleep.   amLODipine 5 MG tablet Commonly known  as: NORVASC TAKE 1.5 TABLETS (7.5 MG TOTAL) BY MOUTH ONCE DAILY DISCONTINUE LOSARTAN-HCTZ   aspirin 81 MG tablet Take 81 mg by mouth daily.   atorvastatin 20 MG tablet Commonly known as: LIPITOR Take 20 mg by mouth every evening.   CRANBERRY PO Take 1 capsule by mouth daily.   felodipine 2.5 MG 24 hr tablet Commonly known as: PLENDIL Take 2.5 mg by mouth daily. May take a second 2.5 mg dose as needed if bp is over 150/90   fexofenadine 180 MG tablet Commonly known as: ALLEGRA Take 180 mg by mouth daily as needed for allergies.    fluticasone 50 MCG/ACT nasal spray Commonly known as: FLONASE Place 2 sprays into the nose daily as needed for allergies.   gabapentin 100 MG capsule Commonly known as: NEURONTIN Take 100 mg by mouth at bedtime.   isosorbide mononitrate 60 MG 24 hr tablet Commonly known as: IMDUR Take 60 mg by mouth daily.   losartan-hydrochlorothiazide 100-12.5 MG tablet Commonly known as: HYZAAR Take 1 tablet by mouth daily.   metoprolol tartrate 50 MG tablet Commonly known as: LOPRESSOR Take by mouth.   multivitamin with minerals Tabs tablet Take 1 tablet by mouth daily.   pantoprazole 40 MG tablet Commonly known as: PROTONIX Take 40 mg by mouth daily.   predniSONE 2.5 MG tablet Commonly known as: DELTASONE Take 2.5 mg by mouth daily.   Premarin vaginal cream Generic drug: conjugated estrogens USE 1 GRAM VAGINALLY EVERY OTHER NIGHT What changed: See the new instructions.   sulfamethoxazole-trimethoprim 800-160 MG tablet Commonly known as: BACTRIM DS Take 1 tablet by mouth every 12 (twelve) hours.       Allergies:  Allergies  Allergen Reactions  . Amoxicillin-Pot Clavulanate Other (See Comments)    Unknown reaction Has patient had a PCN reaction causing immediate rash, facial/tongue/throat swelling, SOB or lightheadedness with hypotension: Unknown Has patient had a PCN reaction causing severe rash involving mucus membranes or skin necrosis: Unknown Has patient had a PCN reaction that required hospitalization: Unknown Has patient had a PCN reaction occurring within the last 10 years: No If all of the above answers are "NO", then may proceed with Cephalosporin use.   . Codeine Hives  . Hydrocodone Nausea Only  . Metronidazole Nausea Only  . Propoxyphene Nausea Only  . Latex Rash    Family History: Family History  Problem Relation Age of Onset  . Anuerysm Mother   . Cancer Father        prostrate cancer  . Diabetes Brother   . Hearing loss Brother   . Bladder  Cancer Neg Hx   . Kidney cancer Neg Hx     Social History:   reports that she has never smoked. She has never used smokeless tobacco. She reports that she does not drink alcohol or use drugs.  ROS: UROLOGY Frequent Urination?: Yes Hard to postpone urination?: No Burning/pain with urination?: Yes Get up at night to urinate?: Yes Leakage of urine?: No Urine stream starts and stops?: No Trouble starting stream?: No Do you have to strain to urinate?: No Blood in urine?: No Urinary tract infection?: Yes Sexually transmitted disease?: No Injury to kidneys or bladder?: No Painful intercourse?: No Weak stream?: No Currently pregnant?: No Vaginal bleeding?: No Last menstrual period?: n  Gastrointestinal Nausea?: No Vomiting?: No Indigestion/heartburn?: No Diarrhea?: No Constipation?: No  Constitutional Fever: No Night sweats?: No Weight loss?: No Fatigue?: No  Skin Skin rash/lesions?: No Itching?: No  Eyes Blurred vision?: No  Double vision?: No  Ears/Nose/Throat Sore throat?: No Sinus problems?: No  Hematologic/Lymphatic Swollen glands?: No Easy bruising?: No  Cardiovascular Leg swelling?: No Chest pain?: No  Respiratory Cough?: No Shortness of breath?: No  Endocrine Excessive thirst?: No  Musculoskeletal Back pain?: No Joint pain?: No  Neurological Headaches?: No Dizziness?: No  Psychologic Depression?: No Anxiety?: No  Physical Exam: BP (!) 152/72   Pulse 60   Ht 5\' 6"  (1.676 m)   Wt 142 lb (64.4 kg)   BMI 22.92 kg/m   Constitutional:  Alert and oriented, no acute distress, nontoxic appearing HEENT: [place], AT Cardiovascular: No clubbing, cyanosis, or edema Respiratory: Normal respiratory effort, no increased work of breathing Skin: No rashes, bruises or suspicious lesions Neurologic: Grossly intact, no focal deficits, moving all 4 extremities Psychiatric: Normal mood and affect  Laboratory Data: Results for orders placed or  performed in visit on 04/06/19  Microscopic Examination   URINE  Result Value Ref Range   WBC, UA 0-5 0 - 5 /hpf   RBC 0-2 0 - 2 /hpf   Epithelial Cells (non renal) 0-10 0 - 10 /hpf   Bacteria, UA None seen None seen/Few  Urinalysis, Complete  Result Value Ref Range   Specific Gravity, UA 1.015 1.005 - 1.030   pH, UA 6.0 5.0 - 7.5   Color, UA Yellow Yellow   Appearance Ur Cloudy (A) Clear   Leukocytes,UA Trace (A) Negative   Protein,UA Negative Negative/Trace   Glucose, UA Negative Negative   Ketones, UA Negative Negative   RBC, UA Negative Negative   Bilirubin, UA Negative Negative   Urobilinogen, Ur 0.2 0.2 - 1.0 mg/dL   Nitrite, UA Negative Negative   Microscopic Examination See below:   BLADDER SCAN AMB NON-IMAGING  Result Value Ref Range   Scan Result 23    Assessment & Plan:   1. Dysuria Patient with weeks-long history of pelvic pain worsened with urination.  UA not overtly concerning for infection.  Will send for culture.  PVR within normal limits, not concerned for urinary retention.  She denies vaginal bleeding, hematochezia, and melena.  I suspect her symptoms are most likely secondary to undertreated OAB and constipation, see below. -Urinalysis, Complete -Urine culture - PVR  2. Constipation I explained to her that constipation may cause irritative voiding symptoms.  I counseled her to begin a bowel regimen of over-the-counter MiraLAX or Benefiber with the goal of producing 1 formed bowel movement daily.   -Start bowel regimen of Miralax or Benefiber  3. OAB I also counseled her to continue taking her Myrbetriq.  I reminded her that it may take several weeks for the full effect of this medication to become evident.  I counseled her to stay well-hydrated over the coming weeks and follow-up again with [place] in 1 month if her symptoms do not improve.  She expressed an understanding of this plan. -Continue taking Myrbetriq daily  4. Atrophic vaginitis -Premarin cream  reordered  Debroah Loop, [facility]  [place] [address], [place], [place] [address] [phone]

## 2019-04-06 NOTE — Patient Instructions (Signed)
1. Stay well hydrated. 2. Your goal is to have one formed bowel movement daily. You may use either of the over-the-counter supplements Benefiber or Miralax to help with this. I recommend that you try Benefiber first and move on to Miralax if this is not helping you enough. Adjust your dose of either of these to cause one daily bowel movement. 3. Continue taking premarin vaginal estrogen cream. Apply a pea-sized amount around the urethra three times weekly.  4.   Continue taking daily Myrbetriq.

## 2019-04-08 ENCOUNTER — Encounter: Payer: Medicare Other | Admitting: Obstetrics and Gynecology

## 2019-04-08 ENCOUNTER — Encounter (INDEPENDENT_AMBULATORY_CARE_PROVIDER_SITE_OTHER): Payer: Self-pay | Admitting: Vascular Surgery

## 2019-04-08 ENCOUNTER — Ambulatory Visit (INDEPENDENT_AMBULATORY_CARE_PROVIDER_SITE_OTHER): Payer: Medicare Other

## 2019-04-08 ENCOUNTER — Other Ambulatory Visit: Payer: Self-pay

## 2019-04-08 ENCOUNTER — Ambulatory Visit (INDEPENDENT_AMBULATORY_CARE_PROVIDER_SITE_OTHER): Payer: Medicare Other | Admitting: Vascular Surgery

## 2019-04-08 VITALS — BP 116/72 | HR 58 | Resp 12 | Ht 67.0 in | Wt 143.0 lb

## 2019-04-08 DIAGNOSIS — E782 Mixed hyperlipidemia: Secondary | ICD-10-CM

## 2019-04-08 DIAGNOSIS — I739 Peripheral vascular disease, unspecified: Secondary | ICD-10-CM

## 2019-04-08 DIAGNOSIS — I6523 Occlusion and stenosis of bilateral carotid arteries: Secondary | ICD-10-CM | POA: Diagnosis not present

## 2019-04-08 DIAGNOSIS — M159 Polyosteoarthritis, unspecified: Secondary | ICD-10-CM

## 2019-04-08 DIAGNOSIS — M150 Primary generalized (osteo)arthritis: Secondary | ICD-10-CM

## 2019-04-08 DIAGNOSIS — I6529 Occlusion and stenosis of unspecified carotid artery: Secondary | ICD-10-CM

## 2019-04-08 DIAGNOSIS — I10 Essential (primary) hypertension: Secondary | ICD-10-CM | POA: Diagnosis not present

## 2019-04-08 DIAGNOSIS — E119 Type 2 diabetes mellitus without complications: Secondary | ICD-10-CM | POA: Diagnosis not present

## 2019-04-08 LAB — CULTURE, URINE COMPREHENSIVE

## 2019-04-08 NOTE — Progress Notes (Signed)
MRN : [id number]  Dawn Mcintyre is a 85 y.o. (Dec 13, 1932) female who presents with chief complaint of  Chief Complaint  Patient presents with  . Follow-up  .  History of Present Illness:   The patient is seen for follow up evaluation of carotid stenosis status post right carotid endarterectomy on 03/13/2018.  There were no post operative problems or complications related to the surgery.  The patient denies neck or incisional pain.  Rightcarotid endarterectomy was treated with primary closure  The patient denies interval amaurosis fugax. There is no recent history of TIA symptoms or focal motor deficits. There is no prior documented CVA.  The patient denies headache.  The patient is taking enteric-coated aspirin 81 mg daily.  The patient has a history of coronary artery disease, no recent episodes of angina or shortness of breath. The patient denies PAD or claudication symptoms. There is a history of hyperlipidemia which is being treated with a statin.   Carotid Duplex 40-59% bilateral ICA stenosis stable no significant change compared to last study.  ABI Rt=1.11 and Lt=1.10 Triphasic  Current Meds  Medication Sig  . acetaminophen (TYLENOL) 500 MG tablet Take 500 mg by mouth daily as needed for headache.  . ALPRAZolam (XANAX) 0.25 MG tablet Take 0.25 mg by mouth at bedtime as needed for sleep.   Marland Kitchen amLODipine (NORVASC) 5 MG tablet TAKE 1.5 TABLETS (7.5 MG TOTAL) BY MOUTH ONCE DAILY DISCONTINUE LOSARTAN-HCTZ  . aspirin 81 MG tablet Take 81 mg by mouth daily.    Marland Kitchen atorvastatin (LIPITOR) 20 MG tablet Take 20 mg by mouth every evening.   . conjugated estrogens (PREMARIN) vaginal cream USE 1 GRAM VAGINALLY EVERY OTHER NIGHT  . felodipine (PLENDIL) 2.5 MG 24 hr tablet Take 2.5 mg by mouth daily. May take a second 2.5 mg dose as needed if bp is over 150/90  . fexofenadine (ALLEGRA) 180 MG tablet Take 180 mg by mouth daily as needed for allergies.   . fluticasone (FLONASE) 50  MCG/ACT nasal spray Place 2 sprays into the nose daily as needed for allergies.   Marland Kitchen gabapentin (NEURONTIN) 100 MG capsule Take 100 mg by mouth at bedtime.   . isosorbide mononitrate (IMDUR) 60 MG 24 hr tablet Take 60 mg by mouth daily.    Marland Kitchen losartan-hydrochlorothiazide (HYZAAR) 100-12.5 MG tablet Take 1 tablet by mouth daily.   . metoprolol tartrate (LOPRESSOR) 50 MG tablet Take by mouth.  . pantoprazole (PROTONIX) 40 MG tablet Take 40 mg by mouth daily.   . predniSONE (DELTASONE) 2.5 MG tablet Take 2.5 mg by mouth daily.     Past Medical History:  Diagnosis Date  . Arteritis ([place])   . Arthritis   . Atrophic vaginitis 11/16/2014  . Carotid artery stenosis   . Cataract   . Difficult intubation   . Environmental and seasonal allergies   . GERD (gastroesophageal reflux disease)   . Hyperlipidemia   . Hypertension   . Lack of bladder control   . Neuropathy   . Osteoporosis   . Pneumonia     x 86yrs ago  . Pre-diabetes   . Ulcer     Past Surgical History:  Procedure Laterality Date  . ABDOMINAL HYSTERECTOMY  1972  . BREAST CYST EXCISION Right 1972  . BREAST CYST EXCISION Left 1972  . ENDARTERECTOMY Left 12/24/2017   Procedure: ENDARTERECTOMY CAROTID;  Surgeon: Katha Cabal, MD;  Location: [facility] ORS;  Service: Vascular;  Laterality: Left;  . ENDARTERECTOMY Right 03/13/2018  Procedure: ENDARTERECTOMY CAROTID;  Surgeon: Katha Cabal, MD;  Location: [facility] ORS;  Service: Vascular;  Laterality: Right;  . EYE SURGERY    . THROAT SURGERY     x 10 yrs ago. throat mass.  . TONSILLECTOMY      Social History Social History   Tobacco Use  . Smoking status: Never Smoker  . Smokeless tobacco: Never Used  Substance Use Topics  . Alcohol use: No    Alcohol/week: 0.0 standard drinks  . Drug use: No    Family History Family History  Problem Relation Age of Onset  . Anuerysm Mother   . Cancer Father        prostrate cancer  . Diabetes Brother   . Hearing loss Brother    . Bladder Cancer Neg Hx   . Kidney cancer Neg Hx     Allergies  Allergen Reactions  . Amoxicillin-Pot Clavulanate Other (See Comments)    Unknown reaction Has patient had a PCN reaction causing immediate rash, facial/tongue/throat swelling, SOB or lightheadedness with hypotension: Unknown Has patient had a PCN reaction causing severe rash involving mucus membranes or skin necrosis: Unknown Has patient had a PCN reaction that required hospitalization: Unknown Has patient had a PCN reaction occurring within the last 10 years: No If all of the above answers are "NO", then may proceed with Cephalosporin use.   . Codeine Hives  . Hydrocodone Nausea Only  . Metronidazole Nausea Only  . Propoxyphene Nausea Only  . Latex Rash     REVIEW OF SYSTEMS (Negative unless checked)  Constitutional: [] Weight loss  [] Fever  [] Chills Cardiac: [] Chest pain   [] Chest pressure   [] Palpitations   [] Shortness of breath when laying flat   [] Shortness of breath with exertion. Vascular:  [] Pain in legs with walking   [] Pain in legs at rest  [] History of DVT   [] Phlebitis   [] Swelling in legs   [] Varicose veins   [] Non-healing ulcers Pulmonary:   [] Uses home oxygen   [] Productive cough   [] Hemoptysis   [] Wheeze  [] COPD   [] Asthma Neurologic:  [] Dizziness   [] Seizures   [] History of stroke   [] History of TIA  [] Aphasia   [] Vissual changes   [] Weakness or numbness in arm   [] Weakness or numbness in leg Musculoskeletal:   [] Joint swelling   [] Joint pain   [] Low back pain Hematologic:  [] Easy bruising  [] Easy bleeding   [] Hypercoagulable state   [] Anemic Gastrointestinal:  [] Diarrhea   [] Vomiting  [] Gastroesophageal reflux/heartburn   [] Difficulty swallowing. Genitourinary:  [] Chronic kidney disease   [] Difficult urination  [] Frequent urination   [] Blood in urine Skin:  [] Rashes   [] Ulcers  Psychological:  [] History of anxiety   []  History of major depression.  Physical Examination  Vitals:   04/08/19 1044   BP: 116/72  Pulse: (!) 58  Resp: 12  Weight: 143 lb (64.9 kg)  Height: 5\' 7"  (1.702 m)   Body mass index is 22.4 kg/m. Gen: WD/WN, NAD Head: [place]/AT, No temporalis wasting.  Ear/Nose/Throat: Hearing grossly intact, nares w/o erythema or drainage Eyes: PER, EOMI, sclera nonicteric.  Neck: Supple, no large masses.   Pulmonary:  Good air movement, no audible wheezing bilaterally, no use of accessory muscles.  Cardiac: RRR, no JVD Vascular:  Right carotid incisional scar well healed, bilateral carotid bruits Vessel Right Left  Radial Palpable Palpable  Brachial Palpable Palpable  Carotid Palpable Palpable  PT Palpable Palpable  DP Trace Palpable Trace Palpable  Gastrointestinal: Non-distended. No guarding/no  peritoneal signs.  Musculoskeletal: M/S 5/5 throughout.  No deformity or atrophy.  Neurologic: CN 2-12 intact. Symmetrical.  Speech is fluent. Motor exam as listed above. Psychiatric: Judgment intact, Mood & affect appropriate for pt's clinical situation. Dermatologic: No rashes or ulcers noted.  No changes consistent with cellulitis. Lymph : No lichenification or skin changes of chronic lymphedema.  CBC Lab Results  Component Value Date   WBC 7.8 03/14/2019   HGB 13.0 03/14/2019   HCT 38.7 03/14/2019   MCV 93.9 03/14/2019   PLT 226 03/14/2019    BMET    Component Value Date/Time   NA 141 03/14/2019 0133   NA 139 09/12/2011 0250   K 3.4 (L) 03/14/2019 0133   K 3.5 09/12/2011 0250   CL 106 03/14/2019 0133   CL 102 09/12/2011 0250   CO2 26 03/14/2019 0133   CO2 23 09/12/2011 0250   GLUCOSE 118 (H) 03/14/2019 0133   GLUCOSE 162 (H) 09/12/2011 0250   BUN 23 03/14/2019 0133   BUN 20 05/30/2016 1358   BUN 23 (H) 09/12/2011 0250   CREATININE 1.08 (H) 03/14/2019 0133   CREATININE 0.94 09/12/2011 0250   CALCIUM 10.0 03/14/2019 0133   CALCIUM 9.1 09/12/2011 0250   GFRNONAA 47 (L) 03/14/2019 0133   GFRNONAA >60 09/12/2011 0250   GFRAA 55 (L) 03/14/2019 0133   GFRAA  >60 09/12/2011 0250   CrCl cannot be calculated (Patient's most recent lab result is older than the maximum 21 days allowed.).  COAG Lab Results  Component Value Date   INR 0.99 03/05/2018   INR 1.02 12/16/2017    Radiology Ct Head Wo Contrast  Result Date: 03/11/2019 CLINICAL DATA:  Headache for approximately 2 weeks. EXAM: CT HEAD WITHOUT CONTRAST TECHNIQUE: Contiguous axial images were obtained from the base of the skull through the vertex without intravenous contrast. COMPARISON:  October 02, 2015 FINDINGS: Brain: No evidence of acute hemorrhage, hydrocephalus, extra-axial collection or mass lesion/mass effect. Patchy areas of hypoattenuation in the subcortical and periventricular white matter of the bilateral frontal lobes, right greater than left. Mild brain parenchymal volume loss and deep white matter microangiopathy. Vascular: Intracranial calcific atherosclerotic disease. Skull: Normal. Negative for fracture or focal lesion. Sinuses/Orbits: No acute finding. Other: None. IMPRESSION: 1. No acute intracranial hemorrhage. 2. Mild brain parenchymal atrophy and chronic microvascular disease. 3. Patchy areas of hypoattenuation in the subcortical and periventricular white matter of the bilateral frontal lobes, right greater than left, which may represent asymmetric chronic small vessel disease or age-indeterminate lacunar infarcts. Electronically Signed   By: Fidela Salisbury M.D.   On: 03/11/2019 19:35     Assessment/Plan 1. Carotid stenosis, bilateral Recommend:  Given the patient's asymptomatic subcritical stenosis no further invasive testing or surgery at this time.  Previous duplex ultrasound shows <50% stenosis bilaterally.  Continue antiplatelet therapy as prescribed Continue management of CAD, HTN and Hyperlipidemia Healthy heart diet,  encouraged exercise at least 4 times per week  Follow up in 12 months with duplex ultrasound and physical exam  - VAS US CAROTID;  Future  2. PAD (peripheral artery disease) (HCC) Recommend:  The patient has evidence of atherosclerosis of the lower extremities with claudication.  The patient does not voice lifestyle limiting changes at this point in time.  Noninvasive studies do not suggest clinically significant change.  No invasive studies, angiography or surgery at this time The patient should continue walking and begin a more formal exercise program.  The patient should continue antiplatelet therapy and aggressive  treatment of the lipid abnormalities  No changes in the patient's medications at this time  The patient should continue wearing graduated compression socks 10-15 mmHg strength to control the mild edema.    3. Essential hypertension Continue antihypertensive medications as already ordered, these medications have been reviewed and there are no changes at this time.   4. Diet-controlled type 2 diabetes mellitus ([place]) Continue hypoglycemic medications as already ordered, these medications have been reviewed and there are no changes at this time.  Hgb A1C to be monitored as already arranged by primary service   5. Primary osteoarthritis involving multiple joints Continue NSAID medications as already ordered, these medications have been reviewed and there are no changes at this time.  Continued activity and therapy was stressed.   6. Mixed hyperlipidemia Continue statin as ordered and reviewed, no changes at this time    [name], MD  04/08/2019 11:17 AM

## 2019-04-09 ENCOUNTER — Other Ambulatory Visit: Payer: Self-pay | Admitting: Physician Assistant

## 2019-04-09 MED ORDER — NITROFURANTOIN MONOHYD MACRO 100 MG PO CAPS: 100.0000 mg | ORAL_CAPSULE | Freq: Two times a day (BID) | ORAL | 0 refills | Status: AC

## 2019-04-09 NOTE — Progress Notes (Signed)
Positive urine culture.  Nitrofurantoin 100 mg twice daily x5 days sent to her pharmacy.  I spoke with the patient and advised her to start these today.  She expressed an understanding of this plan.

## 2019-04-11 ENCOUNTER — Encounter (INDEPENDENT_AMBULATORY_CARE_PROVIDER_SITE_OTHER): Payer: Self-pay | Admitting: Vascular Surgery

## 2019-04-13 ENCOUNTER — Telehealth: Payer: Self-pay | Admitting: Physician Assistant

## 2019-04-13 ENCOUNTER — Other Ambulatory Visit: Payer: Self-pay | Admitting: Physician Assistant

## 2019-04-13 MED ORDER — CIPROFLOXACIN HCL 500 MG PO TABS: 500.0000 mg | ORAL_TABLET | Freq: Two times a day (BID) | ORAL | 0 refills | Status: AC

## 2019-04-13 NOTE — Telephone Encounter (Signed)
Patient did not tolerate nitrofurantoin well, reporting side effect of nausea.  We will switch her to another agent at this time.  Cipro 500 mg twice daily x5 days sent to her pharmacy.  I contacted the patient and advised her to switch to this medication today.  She expressed an understanding of this plan.

## 2019-04-13 NOTE — Telephone Encounter (Signed)
Pt called office to let you know that RX that was called in made her sick.  She only took 1, but it made her sick on her stomach and she wants to know if anything else can be called in.

## 2019-04-14 ENCOUNTER — Encounter: Payer: Medicare Other | Admitting: Family Medicine

## 2019-04-20 ENCOUNTER — Telehealth: Payer: Self-pay | Admitting: Urology

## 2019-04-20 NOTE — Telephone Encounter (Signed)
Would you please add Mrs. Enberg to my schedule tomorrow at 8 am?  She needs a UA with the visit.

## 2019-04-20 NOTE — Telephone Encounter (Signed)
App made pt is aware ° ° °Dawn Mcintyre °

## 2019-04-20 NOTE — Telephone Encounter (Signed)
Pt called and wants you to call her this afternoon when you finish work.  She wants to talk to you about her condition.

## 2019-04-21 ENCOUNTER — Ambulatory Visit (INDEPENDENT_AMBULATORY_CARE_PROVIDER_SITE_OTHER): Payer: Medicare Other | Admitting: Urology

## 2019-04-21 ENCOUNTER — Other Ambulatory Visit: Payer: Self-pay

## 2019-04-21 ENCOUNTER — Ambulatory Visit
Admission: RE | Admit: 2019-04-21 | Discharge: 2019-04-21 | Disposition: A | Discharge status: Home / Self Care | Payer: Medicare Other | Source: Ambulatory Visit | Attending: Urology | Admitting: Urology

## 2019-04-21 ENCOUNTER — Encounter: Payer: Self-pay | Admitting: Urology

## 2019-04-21 VITALS — BP 127/64 | HR 61 | Ht 67.0 in | Wt 142.4 lb

## 2019-04-21 DIAGNOSIS — R3129 Other microscopic hematuria: Secondary | ICD-10-CM | POA: Diagnosis not present

## 2019-04-21 DIAGNOSIS — Z87440 Personal history of urinary (tract) infections: Secondary | ICD-10-CM | POA: Diagnosis not present

## 2019-04-21 DIAGNOSIS — R102 Pelvic and perineal pain: Secondary | ICD-10-CM

## 2019-04-21 LAB — MICROSCOPIC EXAMINATION

## 2019-04-21 LAB — URINALYSIS, COMPLETE
Bilirubin, UA: NEGATIVE
Glucose, UA: NEGATIVE
Ketones, UA: NEGATIVE
Leukocytes,UA: NEGATIVE
Nitrite, UA: POSITIVE — AB
RBC, UA: NEGATIVE
Specific Gravity, UA: 1.015 (ref 1.005–1.030)
Urobilinogen, Ur: 0.2 mg/dL (ref 0.2–1.0)
pH, UA: 5.5 (ref 5.0–7.5)

## 2019-04-21 MED ORDER — OXYCODONE-ACETAMINOPHEN 5-325 MG PO TABS: 1.0000 | ORAL_TABLET | Freq: Three times a day (TID) | ORAL | 0 refills | Status: DC | PRN

## 2019-04-21 NOTE — Progress Notes (Signed)
04/21/2019 3:41 PM   Dawn Mcintyre [birth year] [id number]  Referring provider: Idelle Crouch, MD [facility] [place],  [place] [address]  Chief Complaint  Patient presents with  . Recurrent UTI    HPI: Dawn Mcintyre is a 85 year old female with a history of hematuria, vaginal atrophy, OAB and history of recurrent UTI's who presents today for the complaint of pelvic pain.   History of hematuria  Patient completed a hematuria workup with CTU and cystoscopy in 2017 - no worrisome GU findings were discovered.  She has not had gross hematuria. UA positive for 3-10 RBC's, but it is also nitrite positive.    Vaginal atrophy Using the cream three nights weekly  OAB Stable.  History of rUTI's Risk factors: age, vaginal atrophy and incontinence  Today, she is experiencing frequency, dysuria, nocturia and hesitancy.  She is having pain in her suprapubic area, urethra and vaginal area.  The pain has been consistent for the last month.  She states she can't remember a time when she hasn't been in pain.  She is taking four Tylenols daily for the pain.  She states the pain is worse for a few minutes after urinating.  Her UA is nitrite positive with 3-10 RBC's.    PMH: Past Medical History:  Diagnosis Date  . Arteritis ([place])   . Arthritis   . Atrophic vaginitis 11/16/2014  . Carotid artery stenosis   . Cataract   . Difficult intubation   . Environmental and seasonal allergies   . GERD (gastroesophageal reflux disease)   . Hyperlipidemia   . Hypertension   . Lack of bladder control   . Neuropathy   . Osteoporosis   . Pneumonia     x 106yrs ago  . Pre-diabetes   . Ulcer     Surgical History: Past Surgical History:  Procedure Laterality Date  . ABDOMINAL HYSTERECTOMY  1972  . BREAST CYST EXCISION Right 1972  . BREAST CYST EXCISION Left 1972  . ENDARTERECTOMY Left 12/24/2017   Procedure: ENDARTERECTOMY CAROTID;  Surgeon: Dawn Cabal, MD;   Location: [facility] ORS;  Service: Vascular;  Laterality: Left;  . ENDARTERECTOMY Right 03/13/2018   Procedure: ENDARTERECTOMY CAROTID;  Surgeon: Dawn Cabal, MD;  Location: [facility] ORS;  Service: Vascular;  Laterality: Right;  . EYE SURGERY    . THROAT SURGERY     x 10 yrs ago. throat mass.  . TONSILLECTOMY      Home Medications:  Allergies as of 04/21/2019      Reactions   Amoxicillin-pot Clavulanate Other (See Comments)   Unknown reaction Has patient had a PCN reaction causing immediate rash, facial/tongue/throat swelling, SOB or lightheadedness with hypotension: Unknown Has patient had a PCN reaction causing severe rash involving mucus membranes or skin necrosis: Unknown Has patient had a PCN reaction that required hospitalization: Unknown Has patient had a PCN reaction occurring within the last 10 years: No If all of the above answers are "NO", then may proceed with Cephalosporin use.   Codeine Hives   Hydrocodone Nausea Only   Metronidazole Nausea Only   Propoxyphene Nausea Only   Latex Rash      Medication List       Accurate as of April 21, 2019 11:59 PM. If you have any questions, ask your nurse or doctor.        acetaminophen 500 MG tablet Commonly known as: TYLENOL Take 500 mg by mouth daily as needed for  headache.   ALPRAZolam 0.25 MG tablet Commonly known as: XANAX Take 0.25 mg by mouth at bedtime as needed for sleep.   amLODipine 5 MG tablet Commonly known as: NORVASC TAKE 1.5 TABLETS (7.5 MG TOTAL) BY MOUTH ONCE DAILY DISCONTINUE LOSARTAN-HCTZ   aspirin 81 MG tablet Take 81 mg by mouth daily.   atorvastatin 20 MG tablet Commonly known as: LIPITOR Take 20 mg by mouth every evening.   felodipine 2.5 MG 24 hr tablet Commonly known as: PLENDIL Take 2.5 mg by mouth daily. May take a second 2.5 mg dose as needed if bp is over 150/90   fexofenadine 180 MG tablet Commonly known as: ALLEGRA Take 180 mg by mouth daily as needed for allergies.    fluticasone 50 MCG/ACT nasal spray Commonly known as: FLONASE Place 2 sprays into the nose daily as needed for allergies.   gabapentin 100 MG capsule Commonly known as: NEURONTIN Take 100 mg by mouth at bedtime.   isosorbide mononitrate 60 MG 24 hr tablet Commonly known as: IMDUR Take 60 mg by mouth daily.   losartan-hydrochlorothiazide 100-12.5 MG tablet Commonly known as: HYZAAR Take 1 tablet by mouth daily.   magnesium oxide 400 MG tablet Commonly known as: MAG-OX Take by mouth.   metoprolol tartrate 50 MG tablet Commonly known as: LOPRESSOR Take by mouth.   oxyCODONE-acetaminophen 5-325 MG tablet Commonly known as: PERCOCET/ROXICET Take 1 tablet by mouth every 8 (eight) hours as needed for severe pain. Started by: Dawn Council, PA-C   pantoprazole 40 MG tablet Commonly known as: PROTONIX Take 40 mg by mouth daily.   predniSONE 2.5 MG tablet Commonly known as: DELTASONE Take 2.5 mg by mouth daily.   Premarin vaginal cream Generic drug: conjugated estrogens USE 1 GRAM VAGINALLY EVERY OTHER NIGHT       Allergies:  Allergies  Allergen Reactions  . Amoxicillin-Pot Clavulanate Other (See Comments)    Unknown reaction Has patient had a PCN reaction causing immediate rash, facial/tongue/throat swelling, SOB or lightheadedness with hypotension: Unknown Has patient had a PCN reaction causing severe rash involving mucus membranes or skin necrosis: Unknown Has patient had a PCN reaction that required hospitalization: Unknown Has patient had a PCN reaction occurring within the last 10 years: No If all of the above answers are "NO", then may proceed with Cephalosporin use.   . Codeine Hives  . Hydrocodone Nausea Only  . Metronidazole Nausea Only  . Propoxyphene Nausea Only  . Latex Rash    Family History: Family History  Problem Relation Age of Onset  . Dawn Mcintyre Mother   . Cancer Father        prostrate cancer  . Diabetes Brother   . Hearing loss Brother    . Bladder Cancer Neg Hx   . Kidney cancer Neg Hx     Social History:  reports that she has never smoked. She has never used smokeless tobacco. She reports that she does not drink alcohol or use drugs.  ROS: UROLOGY Frequent Urination?: Yes Hard to postpone urination?: No Burning/pain with urination?: Yes Get up at night to urinate?: Yes Leakage of urine?: No Urine stream starts and stops?: No Trouble starting stream?: No Do you have to strain to urinate?: No Blood in urine?: No Urinary tract infection?: Yes Sexually transmitted disease?: No Injury to kidneys or bladder?: No Painful intercourse?: No Weak stream?: No Currently pregnant?: No Vaginal bleeding?: No Last menstrual period?: n  Gastrointestinal Nausea?: No Vomiting?: No Indigestion/heartburn?: No Diarrhea?: No Constipation?: No  Constitutional Fever: No Night sweats?: No Weight loss?: No Fatigue?: No  Skin Skin rash/lesions?: No Itching?: No  Eyes Blurred vision?: No Double vision?: No  Ears/Nose/Throat Sore throat?: No Sinus problems?: No  Hematologic/Lymphatic Swollen glands?: No Easy bruising?: No  Cardiovascular Leg swelling?: No Chest pain?: No  Respiratory Cough?: No Shortness of breath?: No  Endocrine Excessive thirst?: No  Musculoskeletal Back pain?: No Joint pain?: No  Neurological Headaches?: No Dizziness?: No  Psychologic Depression?: No Anxiety?: No  Physical Exam: BP 127/64 (BP Location: Left Arm, Patient Position: Sitting, Cuff Size: Normal)   Pulse 61   Ht 5\' 7"  (1.702 m)   Wt 142 lb 6.4 oz (64.6 kg)   BMI 22.30 kg/m   Constitutional:  Well nourished. Alert and oriented, No acute distress. HEENT: [place] AT, moist mucus membranes.  Trachea midline, no masses. Cardiovascular: No clubbing, cyanosis, or edema. Respiratory: Normal respiratory effort, no increased work of breathing. GI: Abdomen is soft, non tender, non distended, no abdominal masses. Liver and  spleen not palpable.  No hernias appreciated.  Stool sample for occult testing is not indicated.   GU: No CVA tenderness.  No bladder fullness or masses.  Atrophic external genitalia, sparse pubic hair distribution, no lesions.  Normal urethral meatus, no lesions, no prolapse, no discharge.   No urethral masses, tenderness and/or tenderness. Bladder tender to palpation.  Pale vaginal mucosa, fair estrogen effect, no discharge, no lesions, good pelvic support, no cystocele and no rectocele noted.  Cervix, uterus and adnexa are surgically absent.  Anus and perineum are without rashes or lesions.     Skin: No rashes, bruises or suspicious lesions. Lymph: No inguinal adenopathy. Neurologic: Grossly intact, no focal deficits, moving all 4 extremities. Psychiatric: Normal mood and affect.   Laboratory Data: Lab Results  Component Value Date   WBC 7.8 03/14/2019   HGB 13.0 03/14/2019   HCT 38.7 03/14/2019   MCV 93.9 03/14/2019   PLT 226 03/14/2019    Lab Results  Component Value Date   CREATININE 1.08 (H) 03/14/2019    No results found for: PSA  No results found for: TESTOSTERONE  No results found for: HGBA1C  No results found for: TSH  No results found for: CHOL, HDL, CHOLHDL, VLDL, LDLCALC  Lab Results  Component Value Date   AST 19 02/12/2018   Lab Results  Component Value Date   ALT 14 02/12/2018   No components found for: ALKALINEPHOPHATASE No components found for: BILIRUBINTOTAL  No results found for: ESTRADIOL  Urinalysis Component     Latest Ref Rng & Units 04/21/2019  Specific Gravity, UA     1.005 - 1.030 1.015  pH, UA     5.0 - 7.5 5.5  Color, UA     Yellow Orange  Appearance Ur     Clear Hazy (A)  Leukocytes,UA     Negative Negative  Protein,UA     Negative/Trace Trace (A)  Glucose, UA     Negative Negative  Ketones, UA     Negative Negative  RBC, UA     Negative Negative  Bilirubin, UA     Negative Negative  Urobilinogen, Ur     0.2 - 1.0  mg/dL 0.2  Nitrite, UA     Negative Positive (A)  Microscopic Examination      See below:   Component     Latest Ref Rng & Units 04/21/2019  WBC, UA     0 - 5 /hpf 0-5  RBC  0 - 2 /hpf 3-10 (A)  Epithelial Cells (non renal)     0 - 10 /hpf 0-10  Crystals     N/A Present (A)  Crystal Type     N/A Amorphous Sediment  Bacteria, UA     None seen/Few Few   I have reviewed the labs.  Assessment & Plan:    1. History of recurrent UTI (urinary tract infection) UA is suspicious for infection - will send for culture - will hold on prescribing antibiotics until culture results are available - Urinalysis, Complete  2. Pelvic pain Encouraged to keep appointment with gynecology May be due to rUTI's - will culture urine and reassess - may need CT scan/cystoscopy in the future for further evaluation  3. History of hematuria Hematuria work up completed in 2017 - findings positive for NED No report of gross hematuria  UA today 3-10 RBC's - but suspicious for infection - will culture treat and reassess - if microscopic hematuria persists will pursue hematuria work up  RTC in one year for UA - patient to report any gross hematuria in the interim     Return for pending urine culture results .  These notes generated with voice recognition software. I apologize for typographical errors.  Dawn Council, PA-C  [facility] [address]  [place], [place] [address] [phone]

## 2019-04-22 DIAGNOSIS — Q396 Congenital diverticulum of esophagus: Secondary | ICD-10-CM | POA: Insufficient documentation

## 2019-04-22 DIAGNOSIS — K5901 Slow transit constipation: Secondary | ICD-10-CM | POA: Insufficient documentation

## 2019-04-24 LAB — CULTURE, URINE COMPREHENSIVE

## 2019-04-26 ENCOUNTER — Telehealth: Payer: Self-pay | Admitting: *Deleted

## 2019-04-26 MED ORDER — CIPROFLOXACIN HCL 250 MG PO TABS: 250.0000 mg | ORAL_TABLET | Freq: Two times a day (BID) | ORAL | 0 refills | Status: DC

## 2019-04-26 NOTE — Telephone Encounter (Addendum)
Patient informed-aware RX sent in to pharmacy as requested. Lab appointment scheduled for UA.  ----- Message from Nori Riis, PA-C sent at 04/26/2019  7:30 AM EDT ----- Please let Dawn Mcintyre know that her urine culture was positive for infection.  She needs to start Cipro 250 mg BID x 7 days.  I will need to check her urine again next week to make sure the microscopic blood resolves after she finishes the antibiotic.

## 2019-04-27 ENCOUNTER — Encounter: Payer: Self-pay | Admitting: Obstetrics & Gynecology

## 2019-04-27 ENCOUNTER — Other Ambulatory Visit (HOSPITAL_COMMUNITY)
Admission: RE | Admit: 2019-04-27 | Discharge: 2019-04-27 | Disposition: A | Discharge status: Home / Self Care | Payer: Medicare Other | Source: Ambulatory Visit | Attending: Obstetrics & Gynecology | Admitting: Obstetrics & Gynecology

## 2019-04-27 ENCOUNTER — Other Ambulatory Visit: Payer: Self-pay

## 2019-04-27 ENCOUNTER — Ambulatory Visit (INDEPENDENT_AMBULATORY_CARE_PROVIDER_SITE_OTHER): Payer: Medicare Other | Admitting: Obstetrics & Gynecology

## 2019-04-27 VITALS — BP 156/69 | HR 61 | Ht 67.0 in | Wt 141.0 lb

## 2019-04-27 DIAGNOSIS — B379 Candidiasis, unspecified: Secondary | ICD-10-CM

## 2019-04-27 DIAGNOSIS — T3695XA Adverse effect of unspecified systemic antibiotic, initial encounter: Secondary | ICD-10-CM

## 2019-04-27 DIAGNOSIS — N760 Acute vaginitis: Secondary | ICD-10-CM | POA: Diagnosis not present

## 2019-04-27 MED ORDER — FLUCONAZOLE 150 MG PO TABS: 150.0000 mg | ORAL_TABLET | ORAL | 3 refills | Status: DC

## 2019-04-27 MED ORDER — BORIC ACID CRYS: 600.0000 mg | CRYSTALS | Freq: Every day | 4 refills | Status: DC

## 2019-04-27 NOTE — Progress Notes (Signed)
Would like to get established with GYN has recurrent UTI's and BV infections  Has mammo in march  Had flu shot

## 2019-04-27 NOTE — Patient Instructions (Signed)
Vaginitis Vaginitis is a condition in which the vaginal tissue swells and becomes red (inflamed). This condition is most often caused by a change in the normal balance of bacteria and yeast that live in the vagina. This change causes an overgrowth of certain bacteria or yeast, which causes the inflammation. There are different types of vaginitis, but the most common types are:  Bacterial vaginosis.  Yeast infection (candidiasis).  Trichomoniasis vaginitis. This is a sexually transmitted disease (STD).  Viral vaginitis.  Atrophic vaginitis.  Allergic vaginitis. What are the causes? The cause of this condition depends on the type of vaginitis. It can be caused by:  Bacteria (bacterial vaginosis).  Yeast, which is a fungus (yeast infection).  A parasite (trichomoniasis vaginitis).  A virus (viral vaginitis).  Low hormone levels (atrophic vaginitis). Low hormone levels can occur during pregnancy, breastfeeding, or after menopause.  Irritants, such as bubble baths, scented tampons, and feminine sprays (allergic vaginitis). Other factors can change the normal balance of the yeast and bacteria that live in the vagina. These include:  Antibiotic medicines.  Poor hygiene.  Diaphragms, vaginal sponges, spermicides, birth control pills, and intrauterine devices (IUD).  Sex.  Infection.  Uncontrolled diabetes.  A weakened defense (immune) system. What increases the risk? This condition is more likely to develop in women who:  Smoke.  Use vaginal douches, scented tampons, or scented sanitary pads.  Wear tight-fitting pants.  Wear thong underwear.  Use oral birth control pills or an IUD.  Have sex without a condom.  Have multiple sex partners.  Have an STD.  Frequently use the spermicide nonoxynol-9.  Eat lots of foods high in sugar.  Have uncontrolled diabetes.  Have low estrogen levels.  Have a weakened immune system from an immune disorder or medical  treatment.  Are pregnant or breastfeeding. What are the signs or symptoms? Symptoms vary depending on the cause of the vaginitis. Common symptoms include:  Abnormal vaginal discharge. ? The discharge is white, gray, or yellow with bacterial vaginosis. ? The discharge is thick, white, and cheesy with a yeast infection. ? The discharge is frothy and yellow or greenish with trichomoniasis.  A bad vaginal smell. The smell is fishy with bacterial vaginosis.  Vaginal itching, pain, or swelling.  Sex that is painful.  Pain or burning when urinating. Sometimes there are no symptoms. How is this diagnosed? This condition is diagnosed based on your symptoms and medical history. A physical exam, including a pelvic exam, will also be done. You may also have other tests, including:  Tests to determine the pH level (acidity or alkalinity) of your vagina.  A whiff test, to assess the odor that results when a sample of your vaginal discharge is mixed with a potassium hydroxide solution.  Tests of vaginal fluid. A sample will be examined under a microscope. How is this treated? Treatment varies depending on the type of vaginitis you have. Your treatment may include:  Antibiotic creams or pills to treat bacterial vaginosis and trichomoniasis.  Antifungal medicines, such as vaginal creams or suppositories, to treat a yeast infection.  Medicine to ease discomfort if you have viral vaginitis. Your sexual partner should also be treated.  Estrogen delivered in a cream, pill, suppository, or vaginal ring to treat atrophic vaginitis. If vaginal dryness occurs, lubricants and moisturizing creams may help. You may need to avoid scented soaps, sprays, or douches.  Stopping use of a product that is causing allergic vaginitis. Then using a vaginal cream to treat the symptoms. Follow   these instructions at home: Lifestyle  Keep your genital area clean and dry. Avoid soap, and only rinse the area with  water.  Do not douche or use tampons until your health care provider says it is okay to do so. Use sanitary pads, if needed.  Do not have sex until your health care provider approves. When you can return to sex, practice safe sex and use condoms.  Wipe from front to back. This avoids the spread of bacteria from the rectum to the vagina. General instructions  Take over-the-counter and prescription medicines only as told by your health care provider.  If you were prescribed an antibiotic medicine, take or use it as told by your health care provider. Do not stop taking or using the antibiotic even if you start to feel better.  Keep all follow-up visits as told by your health care provider. This is important. How is this prevented?  Use mild, non-scented products. Do not use things that can irritate the vagina, such as fabric softeners. Avoid the following products if they are scented: ? Feminine sprays. ? Detergents. ? Tampons. ? Feminine hygiene products. ? Soaps or bubble baths.  Let air reach your genital area. ? Wear cotton underwear to reduce moisture buildup. ? Avoid wearing underwear while you sleep. ? Avoid wearing tight pants and underwear or nylons without a cotton panel. ? Avoid wearing thong underwear.  Take off any wet clothing, such as bathing suits, as soon as possible.  Practice safe sex and use condoms. Contact a health care provider if:  You have abdominal pain.  You have a fever.  You have symptoms that last for more than 2-3 days. Get help right away if:  You have a fever and your symptoms suddenly get worse. Summary  Vaginitis is a condition in which the vaginal tissue becomes inflamed.This condition is most often caused by a change in the normal balance of bacteria and yeast that live in the vagina.  Treatment varies depending on the type of vaginitis you have.  Do not douche, use tampons , or have sex until your health care provider approves. When  you can return to sex, practice safe sex and use condoms. This information is not intended to replace advice given to you by your health care provider. Make sure you discuss any questions you have with your health care provider. Document Released: 05/19/2007 Document Revised: 07/04/2017 Document Reviewed: 08/27/2016 Elsevier Patient Education  2020 Elsevier Inc.  

## 2019-04-27 NOTE — Progress Notes (Signed)
GYNECOLOGY OFFICE VISIT NOTE  History:   Dawn Mcintyre is a 85 y.o. [id number] here today to establish care and for evaluation of recurrent vaginitis episodes.  Also has recurrent UTIs, followed by Urology. Always on different antibiotic courses, also gets rebound yeast infections.  Practices good vulvar hygiene, still gets the BV recurrently. Wants to avoid prolonged antibiotic courses for this.   She denies any current abnormal vaginal discharge, bleeding, pelvic pain or other concerns.    Past Medical History:  Diagnosis Date  . Arteritis ([place])   . Arthritis   . Atrophic vaginitis 11/16/2014  . Carotid artery stenosis   . Cataract   . Difficult intubation   . Environmental and seasonal allergies   . GERD (gastroesophageal reflux disease)   . Hyperlipidemia   . Hypertension   . Lack of bladder control   . Neuropathy   . Osteoporosis   . Pneumonia     x 68yrs ago  . Pre-diabetes   . Ulcer     Past Surgical History:  Procedure Laterality Date  . ABDOMINAL HYSTERECTOMY  1972  . BREAST CYST EXCISION Right 1972  . BREAST CYST EXCISION Left 1972  . ENDARTERECTOMY Left 12/24/2017   Procedure: ENDARTERECTOMY CAROTID;  Surgeon: Katha Cabal, MD;  Location: [facility] ORS;  Service: Vascular;  Laterality: Left;  . ENDARTERECTOMY Right 03/13/2018   Procedure: ENDARTERECTOMY CAROTID;  Surgeon: Katha Cabal, MD;  Location: [facility] ORS;  Service: Vascular;  Laterality: Right;  . EYE SURGERY    . THROAT SURGERY     x 10 yrs ago. throat mass.  . TONSILLECTOMY      The following portions of the patient's history were reviewed and updated as appropriate: allergies, current medications, past family history, past medical history, past social history, past surgical history and problem list.   Health Maintenance:  Normal mammogram on 10/07/2018.  Review of Systems:  Pertinent items noted in HPI and remainder of comprehensive ROS otherwise negative.  Physical Exam:  BP (!) 156/69   Pulse  61   Ht 5\' 7"  (1.702 m)   Wt 141 lb (64 kg)   BMI 22.08 kg/m  CONSTITUTIONAL: Well-developed, well-nourished female in no acute distress.  HEENT:  Normocephalic, atraumatic. External right and left ear normal. No scleral icterus.  NECK: Normal range of motion, supple, no masses noted on observation SKIN: No rash noted. Not diaphoretic. No erythema. No pallor. MUSCULOSKELETAL: Normal range of motion. No edema noted. NEUROLOGIC: Alert and oriented to person, place, and time. Normal muscle tone coordination. No cranial nerve deficit noted. PSYCHIATRIC: Normal mood and affect. Normal behavior. Normal judgment and thought content. CARDIOVASCULAR: Normal heart rate noted RESPIRATORY: Effort and breath sounds normal, no problems with respiration noted ABDOMEN: No masses noted. No other overt distention noted.   PELVIC: Normal appearing external genitalia with mild atrophy; normal appearing vaginal mucosa and cuff.  Scant thin white discharge noted, testing sample obtained.    Labs and Imaging Results for orders placed or performed in visit on 04/21/19 (from the past 168 hour(s))  Microscopic Examination   Collection Time: 04/21/19  8:21 AM   URINE  Result Value Ref Range   WBC, UA 0-5 0 - 5 /hpf   RBC 3-10 (A) 0 - 2 /hpf   Epithelial Cells (non renal) 0-10 0 - 10 /hpf   Crystals Present (A) N/A   Crystal Type Amorphous Sediment N/A   Bacteria, UA Few None seen/Few  Urinalysis, Complete   Collection Time:  04/21/19  8:21 AM  Result Value Ref Range   Specific Gravity, UA 1.015 1.005 - 1.030   pH, UA 5.5 5.0 - 7.5   Color, UA Orange Yellow   Appearance Ur Hazy (A) Clear   Leukocytes,UA Negative Negative   Protein,UA Trace (A) Negative/Trace   Glucose, UA Negative Negative   Ketones, UA Negative Negative   RBC, UA Negative Negative   Bilirubin, UA Negative Negative   Urobilinogen, Ur 0.2 0.2 - 1.0 mg/dL   Nitrite, UA Positive (A) Negative   Microscopic Examination See below:    CULTURE, URINE COMPREHENSIVE   Collection Time: 04/21/19  9:05 AM   Specimen: Urine   UR  Result Value Ref Range   Urine Culture, Comprehensive Final report (A)    Organism ID, Bacteria Enterococcus faecalis (A)    Organism ID, Bacteria Comment    ANTIMICROBIAL SUSCEPTIBILITY Comment        Assessment and Plan:    1. Antibiotic-induced yeast infection Diflucan ordered.  - fluconazole (DIFLUCAN) 150 MG tablet; Take 1 tablet (150 mg total) by mouth every 3 (three) days. For three doses  Dispense: 3 tablet; Refill: 3  2. Recurrent vaginitis Exam not concerning but will follow up test results.  Proper vulvar hygiene emphasized: discussed avoidance of perfumed soaps, detergents, lotions and any type of douches; in addition to wearing cotton underwear and no underwear at night.  Also recommended cleaning front to back.  Counseled about boric acid therapy to re-establish proper vaginal pH balance, she is interested in this. It was prescribed. - Boric Acid CRYS; Place 600 mg vaginally at bedtime. Use one capsule vaginally every night for two weeks then twice a week for six months  Dispense: 500 g; Refill: 4 - Cervicovaginal ancillary only( [place])  Routine preventative health maintenance measures emphasized. Please refer to After Visit Summary for other counseling recommendations.   Return in about 2 months (around 06/27/2019) for Followup.    Total face-to-face time with patient: 20 minutes.  Over 50% of encounter was spent on counseling and coordination of care.   Verita Schneiders, MD, [place] for [employer], [place]

## 2019-04-28 LAB — CERVICOVAGINAL ANCILLARY ONLY
Bacterial Vaginitis (gardnerella): NEGATIVE
Candida Glabrata: NEGATIVE
Candida Vaginitis: NEGATIVE
Molecular Disclaimer: NEGATIVE
Molecular Disclaimer: NEGATIVE
Molecular Disclaimer: NEGATIVE
Molecular Disclaimer: NORMAL
Trichomonas: NEGATIVE

## 2019-04-29 ENCOUNTER — Telehealth: Payer: Self-pay | Admitting: *Deleted

## 2019-04-29 NOTE — Telephone Encounter (Signed)
Pt informed of negative results.

## 2019-05-03 ENCOUNTER — Other Ambulatory Visit: Payer: Self-pay | Admitting: *Deleted

## 2019-05-03 DIAGNOSIS — R300 Dysuria: Secondary | ICD-10-CM

## 2019-05-03 DIAGNOSIS — R350 Frequency of micturition: Secondary | ICD-10-CM

## 2019-05-04 ENCOUNTER — Telehealth: Payer: Self-pay | Admitting: Family Medicine

## 2019-05-04 ENCOUNTER — Telehealth: Payer: Self-pay | Admitting: Urology

## 2019-05-04 NOTE — Telephone Encounter (Signed)
If she is not improving, we need to get her scheduled for a CTU to evaluate her for her symptoms and microscopic hematuria.

## 2019-05-04 NOTE — Telephone Encounter (Signed)
Pt has been taking Cipro 250 mg and says it's not working at all.  She is still stinging and hurting with urination.  She wants to know if she could get an RX for Cefuroxime axetil 250 mg sent to [facility] in [place].    She said it worked for her before when she had a UTI.  Please give pt a call and let her know.  She was supposed to come to leave a urine sample tomorrow but says there's no need, because it's going to be the same.

## 2019-05-04 NOTE — Telephone Encounter (Signed)
Spoke to patient and with the Cipro she was having severe dizziness and she only took 2 tablets and she feels this is why she did not get any better.

## 2019-05-04 NOTE — Telephone Encounter (Signed)
Pt called and said she would like to go ahead and have CT scan.

## 2019-05-04 NOTE — Telephone Encounter (Signed)
Spoke to patient and she wants to get the Ct scan

## 2019-05-05 ENCOUNTER — Other Ambulatory Visit: Payer: Self-pay | Admitting: Urology

## 2019-05-05 ENCOUNTER — Other Ambulatory Visit: Payer: Self-pay

## 2019-05-05 DIAGNOSIS — R3129 Other microscopic hematuria: Secondary | ICD-10-CM

## 2019-05-05 NOTE — Telephone Encounter (Signed)
I have put the CTU orders in for her.

## 2019-05-11 ENCOUNTER — Other Ambulatory Visit: Payer: Self-pay

## 2019-05-11 ENCOUNTER — Encounter: Payer: Self-pay | Admitting: Physician Assistant

## 2019-05-11 ENCOUNTER — Ambulatory Visit
Admission: RE | Admit: 2019-05-11 | Discharge: 2019-05-11 | Disposition: A | Discharge status: Home / Self Care | Payer: Medicare Other | Source: Ambulatory Visit | Attending: Urology | Admitting: Urology

## 2019-05-11 ENCOUNTER — Ambulatory Visit (INDEPENDENT_AMBULATORY_CARE_PROVIDER_SITE_OTHER): Payer: Medicare Other | Admitting: Physician Assistant

## 2019-05-11 VITALS — BP 170/74 | HR 71 | Ht 67.0 in | Wt 148.8 lb

## 2019-05-11 DIAGNOSIS — N3001 Acute cystitis with hematuria: Secondary | ICD-10-CM | POA: Diagnosis not present

## 2019-05-11 DIAGNOSIS — Z87440 Personal history of urinary (tract) infections: Secondary | ICD-10-CM

## 2019-05-11 DIAGNOSIS — R3129 Other microscopic hematuria: Secondary | ICD-10-CM

## 2019-05-11 MED ORDER — IOHEXOL 300 MG/ML  SOLN
150.0000 mL | Freq: Once | INTRAMUSCULAR | Status: AC | PRN
  Administered 2019-05-11: 10:00:00 125 mL via INTRAVENOUS

## 2019-05-11 MED ORDER — NITROFURANTOIN MONOHYD MACRO 100 MG PO CAPS: 100.0000 mg | ORAL_CAPSULE | Freq: Two times a day (BID) | ORAL | 0 refills | Status: AC

## 2019-05-11 NOTE — Progress Notes (Signed)
05/11/2019 9:22 AM   Dawn Mcintyre 10-Sep-1932 [id number]  CC: Dysuria, urgency, frequency  HPI: Dawn Mcintyre is a 85 y.o. female who presents today for evaluation of possible UTI. She is an established [facility] patient who last saw Dawn Mcintyre on 04/21/2019 for acute cystitis with hematuria. Urine culture with E faecalis, prescribed Cipro 250mg  BID x7 days. CTU completed this morning for evaluation of microscopic hematuria.  She reports only taking two doses of her prescribed Cipro following her last visit.  She stopped it secondary to dizziness.  In the meantime, she reports continuation of her symptoms of dysuria, urgency, and frequency. She denies fever, chills, nausea, vomiting, flank pain, and gross hematuria. She has taken Tylenol at home with inadequate symptom palliation.  Of note, she reports she had to drink a significant amount of water for her CTU this morning and reports quite dilute urine as a result of this.  In-office UA today pan-negative; specific gravity 1.010.  PMH: Past Medical History:  Diagnosis Date  . Arteritis ([place])   . Arthritis   . Atrophic vaginitis 11/16/2014  . Carotid artery stenosis   . Cataract   . Difficult intubation   . Environmental and seasonal allergies   . GERD (gastroesophageal reflux disease)   . Hyperlipidemia   . Hypertension   . Lack of bladder control   . Neuropathy   . Osteoporosis   . Pneumonia     x 40yrs ago  . Pre-diabetes   . Ulcer     Surgical History: Past Surgical History:  Procedure Laterality Date  . ABDOMINAL HYSTERECTOMY  1972  . BREAST CYST EXCISION Right 1972  . BREAST CYST EXCISION Left 1972  . ENDARTERECTOMY Left 12/24/2017   Procedure: ENDARTERECTOMY CAROTID;  Surgeon: Katha Cabal, MD;  Location: [facility] ORS;  Service: Vascular;  Laterality: Left;  . ENDARTERECTOMY Right 03/13/2018   Procedure: ENDARTERECTOMY CAROTID;  Surgeon: Katha Cabal, MD;  Location: [facility] ORS;  Service: Vascular;   Laterality: Right;  . EYE SURGERY    . THROAT SURGERY     x 10 yrs ago. throat mass.  . TONSILLECTOMY      Home Medications:  Allergies as of 05/11/2019      Reactions   Amoxicillin-pot Clavulanate Other (See Comments)   Unknown reaction Has patient had a PCN reaction causing immediate rash, facial/tongue/throat swelling, SOB or lightheadedness with hypotension: Unknown Has patient had a PCN reaction causing severe rash involving mucus membranes or skin necrosis: Unknown Has patient had a PCN reaction that required hospitalization: Unknown Has patient had a PCN reaction occurring within the last 10 years: No If all of the above answers are "NO", then may proceed with Cephalosporin use.   Codeine Hives   Hydrocodone Nausea Only   Metronidazole Nausea Only   Propoxyphene Nausea Only   Latex Rash      Medication List       Accurate as of May 11, 2019 11:59 PM. If you have any questions, ask your nurse or doctor.        acetaminophen 500 MG tablet Commonly known as: TYLENOL Take 500 mg by mouth daily as needed for headache.   ALPRAZolam 0.25 MG tablet Commonly known as: XANAX Take 0.25 mg by mouth at bedtime as needed for sleep.   amLODipine 5 MG tablet Commonly known as: NORVASC TAKE 1.5 TABLETS (7.5 MG TOTAL) BY MOUTH ONCE DAILY DISCONTINUE LOSARTAN-HCTZ   aspirin 81 MG tablet Take 81 mg by mouth daily.  atorvastatin 20 MG tablet Commonly known as: LIPITOR Take 20 mg by mouth every evening.   Boric Acid [place] 600 mg vaginally at bedtime. Use one capsule vaginally every night for two weeks then twice a week for six months   ciprofloxacin 250 MG tablet Commonly known as: Cipro Take 1 tablet (250 mg total) by mouth 2 (two) times daily.   felodipine 2.5 MG 24 hr tablet Commonly known as: PLENDIL Take 2.5 mg by mouth daily. May take a second 2.5 mg dose as needed if bp is over 150/90   fexofenadine 180 MG tablet Commonly known as: ALLEGRA Take 180 mg by  mouth daily as needed for allergies.   fluconazole 150 MG tablet Commonly known as: DIFLUCAN Take 1 tablet (150 mg total) by mouth every 3 (three) days. For three doses   fluticasone 50 MCG/ACT nasal spray Commonly known as: FLONASE Place 2 sprays into the nose daily as needed for allergies.   gabapentin 100 MG capsule Commonly known as: NEURONTIN Take 100 mg by mouth at bedtime.   isosorbide mononitrate 60 MG 24 hr tablet Commonly known as: IMDUR Take 60 mg by mouth daily.   losartan-hydrochlorothiazide 100-12.5 MG tablet Commonly known as: HYZAAR Take 1 tablet by mouth daily.   magnesium oxide 400 MG tablet Commonly known as: MAG-OX Take by mouth.   metoprolol tartrate 50 MG tablet Commonly known as: LOPRESSOR Take by mouth.   nitrofurantoin (macrocrystal-monohydrate) 100 MG capsule Commonly known as: MACROBID Take 1 capsule (100 mg total) by mouth every 12 (twelve) hours for 7 days. Started by: Debroah Loop, PA-C   oxyCODONE-acetaminophen 5-325 MG tablet Commonly known as: PERCOCET/ROXICET Take 1 tablet by mouth every 8 (eight) hours as needed for severe pain.   pantoprazole 40 MG tablet Commonly known as: PROTONIX Take 40 mg by mouth daily.   predniSONE 2.5 MG tablet Commonly known as: DELTASONE Take 2.5 mg by mouth daily.   Premarin vaginal cream Generic drug: conjugated estrogens USE 1 GRAM VAGINALLY EVERY OTHER NIGHT       Allergies:  Allergies  Allergen Reactions  . Amoxicillin-Pot Clavulanate Other (See Comments)    Unknown reaction Has patient had a PCN reaction causing immediate rash, facial/tongue/throat swelling, SOB or lightheadedness with hypotension: Unknown Has patient had a PCN reaction causing severe rash involving mucus membranes or skin necrosis: Unknown Has patient had a PCN reaction that required hospitalization: Unknown Has patient had a PCN reaction occurring within the last 10 years: No If all of the above answers are  "NO", then may proceed with Cephalosporin use.   . Codeine Hives  . Hydrocodone Nausea Only  . Metronidazole Nausea Only  . Propoxyphene Nausea Only  . Latex Rash    Family History: Family History  Problem Relation Age of Onset  . Anuerysm Mother   . Cancer Father        prostrate cancer  . Diabetes Brother   . Hearing loss Brother   . Bladder Cancer Neg Hx   . Kidney cancer Neg Hx     Social History:   reports that she has never smoked. She has never used smokeless tobacco. She reports that she does not drink alcohol or use drugs.  ROS: UROLOGY Frequent Urination?: Yes Hard to postpone urination?: No Burning/pain with urination?: Yes Get up at night to urinate?: Yes Leakage of urine?: No Urine stream starts and stops?: No Trouble starting stream?: No Do you have to strain to urinate?: No Blood in urine?: No  Urinary tract infection?: Yes Sexually transmitted disease?: No Injury to kidneys or bladder?: No Painful intercourse?: No Weak stream?: No Currently pregnant?: No Vaginal bleeding?: No Last menstrual period?: n  Gastrointestinal Nausea?: No Vomiting?: No Indigestion/heartburn?: No Diarrhea?: No Constipation?: No  Constitutional Fever: No Night sweats?: No Weight loss?: No Fatigue?: No  Skin Skin rash/lesions?: No Itching?: No  Eyes Blurred vision?: No Double vision?: No  Ears/Nose/Throat Sore throat?: No Sinus problems?: Yes  Hematologic/Lymphatic Swollen glands?: No Easy bruising?: No  Cardiovascular Leg swelling?: No Chest pain?: No  Respiratory Cough?: No Shortness of breath?: No  Endocrine Excessive thirst?: No  Musculoskeletal Back pain?: No Joint pain?: No  Neurological Headaches?: No Dizziness?: No  Psychologic Depression?: No Anxiety?: No  Physical Exam: BP (!) 170/74 (BP Location: Left Arm, Patient Position: Sitting, Cuff Size: Normal)   Pulse 71   Ht 5\' 7"  (1.702 m)   Wt 148 lb 12.8 oz (67.5 kg)   BMI  23.31 kg/m   Constitutional:  Alert and oriented, no acute distress, nontoxic appearing HEENT: [place], AT Cardiovascular: No clubbing, cyanosis, or edema Respiratory: Normal respiratory effort, no increased work of breathing Skin: No rashes, bruises or suspicious lesions Neurologic: Grossly intact, no focal deficits, moving all 4 extremities Psychiatric: Normal mood and affect  Laboratory Data: Results for orders placed or performed in visit on 05/11/19  Microscopic Examination   URINE  Result Value Ref Range   WBC, UA 0-5 0 - 5 /hpf   RBC None seen 0 - 2 /hpf   Epithelial Cells (non renal) 0-10 0 - 10 /hpf   Bacteria, UA None seen None seen/Few  Urinalysis, Complete  Result Value Ref Range   Specific Gravity, UA 1.010 1.005 - 1.030   pH, UA 7.0 5.0 - 7.5   Color, UA Colorless Yellow   Appearance Ur Clear Clear   Leukocytes,UA Negative Negative   Protein,UA Negative Negative/Trace   Glucose, UA Negative Negative   Ketones, UA Negative Negative   RBC, UA Negative Negative   Bilirubin, UA Negative Negative   Urobilinogen, Ur 0.2 0.2 - 1.0 mg/dL   Nitrite, UA Negative Negative   Microscopic Examination See below:    Pertinent Imaging: CT hematuria, 05/11/2019: CLINICAL DATA:  Microscopic hematuria, recurrent UTIs, lower pelvic pain  EXAM: CT ABDOMEN AND PELVIS WITHOUT AND WITH CONTRAST  TECHNIQUE: Multidetector CT imaging of the abdomen and pelvis was performed following the standard protocol before and following the bolus administration of intravenous contrast.  CONTRAST:  135mL OMNIPAQUE IOHEXOL 300 MG/ML  SOLN  COMPARISON:  06/17/2016  FINDINGS: Lower chest: Coronary atherosclerosis of the LAD and left circumflex.  Lung bases are essentially clear.  Hepatobiliary: Liver is within normal limits.  Gallbladder is unremarkable. No intrahepatic or extrahepatic ductal dilatation.  Pancreas: Within normal limits.  Spleen: Within normal limits.   Adrenals/Urinary Tract: Adrenal glands are within normal limits.  9 mm cyst in the posterior left lower kidney (series 4/image 38). No enhancing renal lesions.  No renal, ureteral, or bladder calculi.  No hydronephrosis.  On delayed imaging, there are no filling defects in the bilateral opacified proximal collecting systems, ureters, or bladder.  Bladder is within normal limits.  Stomach/Bowel: Stomach is notable for a tiny hiatal hernia.  No evidence of bowel obstruction.  Appendix is not discretely visualized.  Left colonic diverticulosis, without evidence of diverticulitis.  Vascular/Lymphatic: No evidence of abdominal aortic aneurysm.  Atherosclerotic calcifications of the abdominal aorta and branch vessels.  No suspicious abdominopelvic lymphadenopathy.  Reproductive: Status post hysterectomy.  No adnexal masses.  Other: No abdominopelvic ascites.  Musculoskeletal: Mild degenerative changes of the lower thoracic spine.  IMPRESSION: 9 mm left lower pole renal cyst, benign (Bosniak I). No enhancing renal lesions.  No CT findings account patient's hematuria.   Electronically Signed   By: Julian Hy M.D.   On: 05/11/2019 10:53  I personally reviewed the images referenced above and note the presence of a left renal cyst.  Assessment & Plan:   1. History of recurrent UTI (urinary tract infection) Patient with persistent irritative voiding symptoms in the setting of incomplete treatment for acute cystitis with hematuria 2 weeks ago.  I believe her current presentation is consistent with continuation of this previously-identified infection. Will prescribe antibiotics based on her urine culture from 2 weeks ago.    Urine specific gravity is quite low today; I suspect her negative UA is false and secondary to dilution.  Will send urine for culture, however suspect this may also come back negative.  We will proceed with treatment despite this.   - Urinalysis, Complete - nitrofurantoin, macrocrystal-monohydrate, (MACROBID) 100 MG capsule; Take 1 capsule (100 mg total) by mouth every 12 (twelve) hours for 7 days.  Dispense: 14 capsule; Refill: 0 - CULTURE, URINE COMPREHENSIVE  2. Acute cystitis with hematuria I would like her to her repeat UA in clinic to prove resolution of hematuria when she is not in a diluted state after she completes her new antibiotic. - Urinalysis, Complete; Future  Return in about 2 weeks (around 05/25/2019) for Lab visit for UA.  Debroah Loop, PA-C  [facility] [address] [place], [place] [address] [phone]

## 2019-05-12 ENCOUNTER — Telehealth: Payer: Self-pay | Admitting: Physician Assistant

## 2019-05-12 LAB — URINALYSIS, COMPLETE
Bilirubin, UA: NEGATIVE
Glucose, UA: NEGATIVE
Ketones, UA: NEGATIVE
Leukocytes,UA: NEGATIVE
Nitrite, UA: NEGATIVE
Protein,UA: NEGATIVE
RBC, UA: NEGATIVE
Specific Gravity, UA: 1.01 (ref 1.005–1.030)
Urobilinogen, Ur: 0.2 mg/dL (ref 0.2–1.0)
pH, UA: 7 (ref 5.0–7.5)

## 2019-05-12 LAB — MICROSCOPIC EXAMINATION
Bacteria, UA: NONE SEEN
RBC, Urine: NONE SEEN /hpf (ref 0–2)

## 2019-05-12 NOTE — Telephone Encounter (Signed)
Patient called the office today requesting a call back from [facility], [place] with the results of her CT scan.

## 2019-05-13 NOTE — Telephone Encounter (Signed)
I did spoke with the patient via telephone.  I reported the results of her CT scan, namely that there was no obvious cause for her microscopic hematuria.  I did report an incidental Bosniak 1 renal cyst that is unconcerning and does not require follow-up.  She expressed understanding.

## 2019-05-15 LAB — CULTURE, URINE COMPREHENSIVE

## 2019-05-18 ENCOUNTER — Other Ambulatory Visit: Payer: Medicare Other

## 2019-05-18 ENCOUNTER — Other Ambulatory Visit: Payer: Self-pay

## 2019-05-18 DIAGNOSIS — N3001 Acute cystitis with hematuria: Secondary | ICD-10-CM

## 2019-05-18 LAB — URINALYSIS, COMPLETE
Bilirubin, UA: NEGATIVE
Glucose, UA: NEGATIVE
Leukocytes,UA: NEGATIVE
Nitrite, UA: NEGATIVE
Protein,UA: NEGATIVE
RBC, UA: NEGATIVE
Specific Gravity, UA: 1.025 (ref 1.005–1.030)
Urobilinogen, Ur: 0.2 mg/dL (ref 0.2–1.0)
pH, UA: 5.5 (ref 5.0–7.5)

## 2019-05-18 LAB — MICROSCOPIC EXAMINATION
Epithelial Cells (non renal): >10 /hpf — AB (ref 0–10)
RBC, Urine: NONE SEEN /hpf (ref 0–2)

## 2019-05-19 ENCOUNTER — Other Ambulatory Visit: Payer: Medicare Other

## 2019-05-25 ENCOUNTER — Other Ambulatory Visit: Payer: Medicare Other

## 2019-06-17 ENCOUNTER — Ambulatory Visit: Payer: Medicare Other | Admitting: Obstetrics & Gynecology

## 2019-07-20 ENCOUNTER — Ambulatory Visit: Payer: Medicare Other | Admitting: Obstetrics & Gynecology

## 2019-08-09 ENCOUNTER — Other Ambulatory Visit: Payer: Self-pay | Admitting: Internal Medicine

## 2019-08-09 DIAGNOSIS — Z1211 Encounter for screening for malignant neoplasm of colon: Secondary | ICD-10-CM | POA: Diagnosis not present

## 2019-08-09 DIAGNOSIS — N1831 Chronic kidney disease, stage 3a: Secondary | ICD-10-CM | POA: Diagnosis not present

## 2019-08-09 DIAGNOSIS — F419 Anxiety disorder, unspecified: Secondary | ICD-10-CM | POA: Diagnosis not present

## 2019-08-09 DIAGNOSIS — Z1231 Encounter for screening mammogram for malignant neoplasm of breast: Secondary | ICD-10-CM

## 2019-08-09 DIAGNOSIS — E1122 Type 2 diabetes mellitus with diabetic chronic kidney disease: Secondary | ICD-10-CM | POA: Diagnosis not present

## 2019-08-09 DIAGNOSIS — E7800 Pure hypercholesterolemia, unspecified: Secondary | ICD-10-CM | POA: Diagnosis not present

## 2019-08-09 DIAGNOSIS — I129 Hypertensive chronic kidney disease with stage 1 through stage 4 chronic kidney disease, or unspecified chronic kidney disease: Secondary | ICD-10-CM | POA: Diagnosis not present

## 2019-08-09 DIAGNOSIS — Z79899 Other long term (current) drug therapy: Secondary | ICD-10-CM | POA: Diagnosis not present

## 2019-08-09 DIAGNOSIS — M25421 Effusion, right elbow: Secondary | ICD-10-CM | POA: Diagnosis not present

## 2019-08-17 ENCOUNTER — Encounter: Payer: Self-pay | Admitting: Obstetrics and Gynecology

## 2019-08-17 ENCOUNTER — Other Ambulatory Visit: Payer: Self-pay

## 2019-08-17 ENCOUNTER — Other Ambulatory Visit (HOSPITAL_COMMUNITY)
Admission: RE | Admit: 2019-08-17 | Discharge: 2019-08-17 | Disposition: A | Discharge status: Home / Self Care | Payer: Medicare HMO | Source: Ambulatory Visit | Attending: Obstetrics and Gynecology | Admitting: Obstetrics and Gynecology

## 2019-08-17 ENCOUNTER — Ambulatory Visit (INDEPENDENT_AMBULATORY_CARE_PROVIDER_SITE_OTHER): Payer: Medicare HMO | Admitting: Obstetrics and Gynecology

## 2019-08-17 VITALS — BP 130/80 | HR 72 | Wt 142.0 lb

## 2019-08-17 DIAGNOSIS — N390 Urinary tract infection, site not specified: Secondary | ICD-10-CM

## 2019-08-17 DIAGNOSIS — B379 Candidiasis, unspecified: Secondary | ICD-10-CM | POA: Diagnosis not present

## 2019-08-17 DIAGNOSIS — T3695XA Adverse effect of unspecified systemic antibiotic, initial encounter: Secondary | ICD-10-CM

## 2019-08-17 DIAGNOSIS — N760 Acute vaginitis: Secondary | ICD-10-CM

## 2019-08-17 MED ORDER — FLUCONAZOLE 150 MG PO TABS: 150.0000 mg | ORAL_TABLET | ORAL | 3 refills | Status: DC

## 2019-08-17 NOTE — Progress Notes (Signed)
GYNECOLOGY OFFICE FOLLOW UP NOTE  History:  86 y.o. [id number] here today for follow up for recurrent vaginitis. Seen in 04/2019 for same and prescribed boric acid at that point. She reports her symptoms are better. Still having symptoms about once a month, thinks it may be a yeast infection. She uses Premarin cream when she has these symptoms, as well as the diflucan and symptoms improve. Has recurrent UTI as well and is on macrobid daily for suppression. Feels her UTI symptoms are much better. Overall, she is satisfied with how much her symptoms have improved.   Past Medical History:  Diagnosis Date  . Arteritis ([place])   . Arthritis   . Atrophic vaginitis 11/16/2014  . Carotid artery stenosis   . Cataract   . Difficult intubation   . Environmental and seasonal allergies   . GERD (gastroesophageal reflux disease)   . Hyperlipidemia   . Hypertension   . Lack of bladder control   . Neuropathy   . Osteoporosis   . Pneumonia     x 52yrs ago  . Pre-diabetes   . Ulcer     Past Surgical History:  Procedure Laterality Date  . ABDOMINAL HYSTERECTOMY  1972  . BREAST CYST EXCISION Right 1972  . BREAST CYST EXCISION Left 1972  . ENDARTERECTOMY Left 12/24/2017   Procedure: ENDARTERECTOMY CAROTID;  Surgeon: Katha Cabal, MD;  Location: [facility] ORS;  Service: Vascular;  Laterality: Left;  . ENDARTERECTOMY Right 03/13/2018   Procedure: ENDARTERECTOMY CAROTID;  Surgeon: Katha Cabal, MD;  Location: [facility] ORS;  Service: Vascular;  Laterality: Right;  . EYE SURGERY    . THROAT SURGERY     x 10 yrs ago. throat mass.  . TONSILLECTOMY       Current Outpatient Medications:  .  acetaminophen (TYLENOL) 500 MG tablet, Take 500 mg by mouth daily as needed for headache., Disp: , Rfl:  .  ALPRAZolam (XANAX) 0.25 MG tablet, Take 0.25 mg by mouth at bedtime as needed for sleep. , Disp: , Rfl:  .  amLODipine (NORVASC) 5 MG tablet, TAKE 1.5 TABLETS (7.5 MG TOTAL) BY MOUTH ONCE DAILY DISCONTINUE  LOSARTAN-HCTZ, Disp: , Rfl: 1 .  aspirin 81 MG tablet, Take 81 mg by mouth daily.  , Disp: , Rfl:  .  Boric Acid [place], Place 600 mg vaginally at bedtime. Use one capsule vaginally every night for two weeks then twice a week for six months, Disp: 500 g, Rfl: 4 .  conjugated estrogens (PREMARIN) vaginal cream, USE 1 GRAM VAGINALLY EVERY OTHER NIGHT, Disp: 30 g, Rfl: 0 .  fexofenadine (ALLEGRA) 180 MG tablet, Take 180 mg by mouth daily as needed for allergies. , Disp: , Rfl:  .  fluconazole (DIFLUCAN) 150 MG tablet, Take 1 tablet (150 mg total) by mouth every 3 (three) days. For three doses, Disp: 3 tablet, Rfl: 3 .  gabapentin (NEURONTIN) 100 MG capsule, Take 100 mg by mouth at bedtime. , Disp: , Rfl:  .  isosorbide mononitrate (IMDUR) 60 MG 24 hr tablet, Take 60 mg by mouth daily.  , Disp: , Rfl:  .  nitrofurantoin, macrocrystal-monohydrate, (MACROBID) 100 MG capsule, Take 100 mg by mouth at bedtime., Disp: , Rfl:  .  pantoprazole (PROTONIX) 40 MG tablet, Take 40 mg by mouth daily. , Disp: , Rfl:  .  atorvastatin (LIPITOR) 20 MG tablet, Take 20 mg by mouth every evening. , Disp: , Rfl:  .  felodipine (PLENDIL) 2.5 MG 24 hr tablet, Take 2.5  mg by mouth daily. May take a second 2.5 mg dose as needed if bp is over 150/90, Disp: , Rfl:  .  fluticasone (FLONASE) 50 MCG/ACT nasal spray, Place 2 sprays into the nose daily as needed for allergies. , Disp: , Rfl:  .  losartan-hydrochlorothiazide (HYZAAR) 100-12.5 MG tablet, Take 1 tablet by mouth daily. , Disp: , Rfl:  .  metoprolol tartrate (LOPRESSOR) 50 MG tablet, Take by mouth., Disp: , Rfl:  .  oxyCODONE-acetaminophen (PERCOCET/ROXICET) 5-325 MG tablet, Take 1 tablet by mouth every 8 (eight) hours as needed for severe pain., Disp: 5 tablet, Rfl: 0 .  predniSONE (DELTASONE) 2.5 MG tablet, Take 2.5 mg by mouth daily. , Disp: , Rfl:   The following portions of the patient's history were reviewed and updated as appropriate: allergies, current medications,  past family history, past medical history, past social history, past surgical history and problem list.   Review of Systems:  Pertinent items noted in HPI and remainder of comprehensive ROS otherwise negative.   Objective:  Physical Exam BP 130/80   Pulse 72   Wt 142 lb (64.4 kg)   BMI 22.24 kg/m  CONSTITUTIONAL: Well-developed, well-nourished female in no acute distress.  HENT:  Normocephalic, atraumatic. External right and left ear normal. Oropharynx is clear and moist EYES: Conjunctivae and EOM are normal. Pupils are equal, round, and reactive to light. No scleral icterus.  NECK: Normal range of motion, supple, no masses SKIN: Skin is warm and dry. No rash noted. Not diaphoretic. No erythema. No pallor. NEUROLOGIC: Alert and oriented to person, place, and time. Normal reflexes, muscle tone coordination. No cranial nerve deficit noted. PSYCHIATRIC: Normal mood and affect. Normal behavior. Normal judgment and thought content. CARDIOVASCULAR: Normal heart rate noted RESPIRATORY: Effort normal, no problems with respiration noted ABDOMEN: Soft, no distention noted.   PELVIC: atrophic but otherwise normal appearing external genitalia; normal appearing vaginal mucosa and cervix.  No abnormal discharge noted.  pelvic cultures obtained. MUSCULOSKELETAL: Normal range of motion. No edema noted.  Exam done with chaperone present.  Labs and Imaging No results found.  Assessment & Plan:   1. Recurrent vaginitis - Improved with recurrent boric acid use - wet prep today as she is having some symptoms - cont weekly boric acid - reviewed that she may be able this for cheaper on [employer], she will ask her niece who is a pharmacist to assist her with this - refill diflucan today as this is helping her with her monthly symptoms - return 3 months or sooner as needed  2. Recurrent UTI - Improved with daily macrobid per Urology - cont regimen   Routine preventative health maintenance measures  emphasized. Please refer to After Visit Summary for other counseling recommendations.   Return in about 3 months (around 11/15/2019) for with Dr. Terrilee Croak, Followup.  Total face-to-face time with patient: 20 minutes. Over 50% of encounter was spent on counseling and coordination of care.  Feliz Beam, M.D. Attending [facility] for [employer] [facility])

## 2019-08-18 ENCOUNTER — Telehealth: Payer: Self-pay | Admitting: *Deleted

## 2019-08-18 DIAGNOSIS — E538 Deficiency of other specified B group vitamins: Secondary | ICD-10-CM | POA: Diagnosis not present

## 2019-08-18 LAB — CERVICOVAGINAL ANCILLARY ONLY
Bacterial Vaginitis (gardnerella): NEGATIVE
Candida Glabrata: NEGATIVE
Candida Vaginitis: NEGATIVE
Comment: NEGATIVE
Comment: NEGATIVE
Comment: NEGATIVE

## 2019-08-18 NOTE — Telephone Encounter (Signed)
Pt informed of negative results.

## 2019-08-18 NOTE — Telephone Encounter (Signed)
-----   Message from Sloan Leiter, MD sent at 08/18/2019 11:44 AM EST ----- Please call and let patient know neg for infection

## 2019-08-20 DIAGNOSIS — Z1211 Encounter for screening for malignant neoplasm of colon: Secondary | ICD-10-CM | POA: Diagnosis not present

## 2019-08-24 ENCOUNTER — Other Ambulatory Visit (HOSPITAL_COMMUNITY): Payer: Self-pay | Admitting: Physician Assistant

## 2019-08-24 ENCOUNTER — Other Ambulatory Visit: Payer: Self-pay | Admitting: Physician Assistant

## 2019-08-24 DIAGNOSIS — K219 Gastro-esophageal reflux disease without esophagitis: Secondary | ICD-10-CM

## 2019-08-24 DIAGNOSIS — M7021 Olecranon bursitis, right elbow: Secondary | ICD-10-CM | POA: Diagnosis not present

## 2019-08-24 DIAGNOSIS — K5900 Constipation, unspecified: Secondary | ICD-10-CM | POA: Diagnosis not present

## 2019-08-24 DIAGNOSIS — M25521 Pain in right elbow: Secondary | ICD-10-CM | POA: Diagnosis not present

## 2019-08-24 DIAGNOSIS — R1011 Right upper quadrant pain: Secondary | ICD-10-CM

## 2019-08-28 DIAGNOSIS — M25511 Pain in right shoulder: Secondary | ICD-10-CM | POA: Diagnosis not present

## 2019-08-31 ENCOUNTER — Other Ambulatory Visit: Payer: Self-pay

## 2019-08-31 ENCOUNTER — Ambulatory Visit
Admission: RE | Admit: 2019-08-31 | Discharge: 2019-08-31 | Disposition: A | Discharge status: Home / Self Care | Payer: Medicare HMO | Source: Ambulatory Visit | Attending: Physician Assistant | Admitting: Physician Assistant

## 2019-08-31 DIAGNOSIS — K219 Gastro-esophageal reflux disease without esophagitis: Secondary | ICD-10-CM

## 2019-08-31 DIAGNOSIS — R1011 Right upper quadrant pain: Secondary | ICD-10-CM

## 2019-09-07 DIAGNOSIS — R110 Nausea: Secondary | ICD-10-CM | POA: Diagnosis not present

## 2019-09-07 DIAGNOSIS — R1013 Epigastric pain: Secondary | ICD-10-CM | POA: Diagnosis not present

## 2019-09-09 DIAGNOSIS — R110 Nausea: Secondary | ICD-10-CM | POA: Diagnosis not present

## 2019-09-09 DIAGNOSIS — R1013 Epigastric pain: Secondary | ICD-10-CM | POA: Diagnosis not present

## 2019-09-30 DIAGNOSIS — M7061 Trochanteric bursitis, right hip: Secondary | ICD-10-CM | POA: Diagnosis not present

## 2019-09-30 DIAGNOSIS — E538 Deficiency of other specified B group vitamins: Secondary | ICD-10-CM | POA: Diagnosis not present

## 2019-10-04 ENCOUNTER — Emergency Department
Admission: EM | Admit: 2019-10-04 | Discharge: 2019-10-04 | Disposition: A | Discharge status: Home / Self Care | Payer: Medicare HMO | Attending: Student in an Organized Health Care Education/Training Program | Admitting: Student in an Organized Health Care Education/Training Program

## 2019-10-04 ENCOUNTER — Other Ambulatory Visit: Payer: Self-pay

## 2019-10-04 ENCOUNTER — Encounter: Payer: Self-pay | Admitting: *Deleted

## 2019-10-04 DIAGNOSIS — Z79899 Other long term (current) drug therapy: Secondary | ICD-10-CM | POA: Insufficient documentation

## 2019-10-04 DIAGNOSIS — I10 Essential (primary) hypertension: Secondary | ICD-10-CM | POA: Diagnosis not present

## 2019-10-04 DIAGNOSIS — N183 Chronic kidney disease, stage 3 unspecified: Secondary | ICD-10-CM | POA: Insufficient documentation

## 2019-10-04 DIAGNOSIS — Z7982 Long term (current) use of aspirin: Secondary | ICD-10-CM | POA: Diagnosis not present

## 2019-10-04 DIAGNOSIS — Z91040 Latex allergy status: Secondary | ICD-10-CM | POA: Diagnosis not present

## 2019-10-04 DIAGNOSIS — E1140 Type 2 diabetes mellitus with diabetic neuropathy, unspecified: Secondary | ICD-10-CM | POA: Diagnosis not present

## 2019-10-04 DIAGNOSIS — I129 Hypertensive chronic kidney disease with stage 1 through stage 4 chronic kidney disease, or unspecified chronic kidney disease: Secondary | ICD-10-CM | POA: Insufficient documentation

## 2019-10-04 DIAGNOSIS — E1122 Type 2 diabetes mellitus with diabetic chronic kidney disease: Secondary | ICD-10-CM | POA: Diagnosis not present

## 2019-10-04 LAB — CBC
HCT: 38.6 % (ref 36.0–46.0)
Hemoglobin: 13 g/dL (ref 12.0–15.0)
MCH: 31 pg (ref 26.0–34.0)
MCHC: 33.7 g/dL (ref 30.0–36.0)
MCV: 92.1 fL (ref 80.0–100.0)
Platelets: 294 10*3/uL (ref 150–400)
RBC: 4.19 MIL/uL (ref 3.87–5.11)
RDW: 12.1 % (ref 11.5–15.5)
WBC: 12.2 10*3/uL — ABNORMAL HIGH (ref 4.0–10.5)
nRBC: 0 % (ref 0.0–0.2)

## 2019-10-04 LAB — BASIC METABOLIC PANEL
Anion gap: 10 (ref 5–15)
BUN: 22 mg/dL (ref 8–23)
CO2: 25 mmol/L (ref 22–32)
Calcium: 10.7 mg/dL — ABNORMAL HIGH (ref 8.9–10.3)
Chloride: 105 mmol/L (ref 98–111)
Creatinine, Ser: 1.08 mg/dL — ABNORMAL HIGH (ref 0.44–1.00)
GFR calc Af Amer: 55 mL/min — ABNORMAL LOW (ref >60)
GFR calc non Af Amer: 47 mL/min — ABNORMAL LOW (ref >60)
Glucose, Bld: 146 mg/dL — ABNORMAL HIGH (ref 70–99)
Potassium: 4 mmol/L (ref 3.5–5.1)
Sodium: 140 mmol/L (ref 135–145)

## 2019-10-04 MED ORDER — AMLODIPINE BESYLATE 5 MG PO TABS
5.0000 mg | ORAL_TABLET | Freq: Once | ORAL | Status: DC
  Filled 2019-10-04: qty 1

## 2019-10-04 NOTE — ED Triage Notes (Signed)
Pt has elevated blood pressure.  Pt took bp meds today.  No chest pain or sob.  No dizziness.  No n/v/.  Pt alert  Speech clear.

## 2019-10-04 NOTE — ED Provider Notes (Signed)
[facility] Emergency Department Provider Note    First MD Initiated Contact with Patient 10/04/19 1909     (approximate)  I have reviewed the triage vital signs and the nursing notes.   HISTORY  Chief Complaint Hypertension    HPI Dawn Mcintyre is a 86 y.o. female [place] past medical history presents to the ER for concern for elevated blood pressure.  States that she has been taking her meds throughout the day states that this evening her blood pressure was 209.  Denies any chest pain or pressure both like she needed to be checked out.  She denies any numbness or tingling.  No shortness of breath.  No abdominal pain.  Denies any missed doses.  No recent new medication changes.  Did just take another clonidine here in the ER.   Past Medical History:  Diagnosis Date  . Arteritis ([place])   . Arthritis   . Atrophic vaginitis 11/16/2014  . Carotid artery stenosis   . Cataract   . Difficult intubation   . Environmental and seasonal allergies   . GERD (gastroesophageal reflux disease)   . Hyperlipidemia   . Hypertension   . Lack of bladder control   . Neuropathy   . Osteoporosis   . Pneumonia     x 87yrs ago  . Pre-diabetes   . Ulcer    Family History  Problem Relation Age of Onset  . Anuerysm Mother   . Cancer Father        prostrate cancer  . Diabetes Brother   . Hearing loss Brother   . Bladder Cancer Neg Hx   . Kidney cancer Neg Hx    Past Surgical History:  Procedure Laterality Date  . ABDOMINAL HYSTERECTOMY  1972  . BREAST CYST EXCISION Right 1972  . BREAST CYST EXCISION Left 1972  . ENDARTERECTOMY Left 12/24/2017   Procedure: ENDARTERECTOMY CAROTID;  Surgeon: Katha Cabal, MD;  Location: [facility] ORS;  Service: Vascular;  Laterality: Left;  . ENDARTERECTOMY Right 03/13/2018   Procedure: ENDARTERECTOMY CAROTID;  Surgeon: Katha Cabal, MD;  Location: [facility] ORS;  Service: Vascular;  Laterality: Right;  . EYE SURGERY    . THROAT  SURGERY     x 10 yrs ago. throat mass.  . TONSILLECTOMY     Patient Active Problem List   Diagnosis Date Noted  . Greater trochanteric bursitis of right hip 02/02/2019  . Diabetic neuropathy ([place]) 12/17/2018  . PAD (peripheral artery disease) ([place]) 10/05/2018  . Urinary incontinence in female 09/08/2018  . Carotid stenosis, bilateral 12/24/2017  . Syncope and collapse 11/15/2017  . CAD (coronary artery disease) 11/15/2017  . Leg pain 10/26/2017  . Bilateral carotid artery disease ([place]) 07/11/2016  . Primary osteoarthritis involving multiple joints 07/11/2016  . Pure hypercholesterolemia 07/11/2016  . Carotid stenosis 07/01/2016  . Hyperlipidemia 07/01/2016  . Peripheral sensory neuropathy ([place]) 03/25/2016  . Type 2 diabetes mellitus with stage 3 chronic kidney disease, without long-term current use of insulin ([place]) 03/25/2016  . Anxiety 11/24/2015  . Sensory peripheral neuropathy ([place]) 10/30/2015  . Influenza 10/15/2015  . CKD (chronic kidney disease) stage 3, GFR 30-59 ml/min 07/19/2015  . Osteopenia 07/19/2015  . Chronic insomnia 01/25/2015  . Diet-controlled type 2 diabetes mellitus ([place]) 01/25/2015  . Gastroesophageal reflux disease without esophagitis 01/25/2015  . Mixed stress and urge urinary incontinence 01/25/2015  . Atrophic vaginitis 11/16/2014  . Vitamin B 12 deficiency 10/26/2014  . Abdominal pain, chronic, epigastric 06/15/2014  .  Essential hypertension 04/28/2014  . Sciatica of left side 04/07/2014  . HTN (hypertension) 01/28/2014  . Type 2 diabetes mellitus ([place]) 01/28/2014      Prior to Admission medications   Medication Sig Start Date End Date Taking? Authorizing Provider  acetaminophen (TYLENOL) 500 MG tablet Take 500 mg by mouth daily as needed for headache.    [provider]  ALPRAZolam Duanne Moron) 0.25 MG tablet Take 0.25 mg by mouth at bedtime as needed for sleep.     [provider]  amLODipine (NORVASC) 5 MG tablet TAKE 1.5 TABLETS  (7.5 MG TOTAL) BY MOUTH ONCE DAILY DISCONTINUE LOSARTAN-HCTZ 03/05/18   [provider]  aspirin 81 MG tablet Take 81 mg by mouth daily.      [provider]  atorvastatin (LIPITOR) 20 MG tablet Take 20 mg by mouth every evening.  06/25/16 04/08/19  [provider]  Boric Acid CRYS Place 600 mg vaginally at bedtime. Use one capsule vaginally every night for two weeks then twice a week for six months 04/27/19   Anyanwu, Sallyanne Havers, MD  conjugated estrogens (PREMARIN) vaginal cream USE 1 GRAM VAGINALLY EVERY OTHER NIGHT 04/06/19   Vaillancourt, Aldona Bar, PA-C  felodipine (PLENDIL) 2.5 MG 24 hr tablet Take 2.5 mg by mouth daily. May take a second 2.5 mg dose as needed if bp is over 150/90    [provider]  fexofenadine (ALLEGRA) 180 MG tablet Take 180 mg by mouth daily as needed for allergies.     [provider]  fluconazole (DIFLUCAN) 150 MG tablet Take 1 tablet (150 mg total) by mouth every 3 (three) days. For three doses 08/17/19   Sloan Leiter, MD  fluticasone [facility]) 50 MCG/ACT nasal spray Place 2 sprays into the nose daily as needed for allergies.  02/19/15 04/08/19  [provider]  gabapentin (NEURONTIN) 100 MG capsule Take 100 mg by mouth at bedtime.     [provider]  isosorbide mononitrate (IMDUR) 60 MG 24 hr tablet Take 60 mg by mouth daily.      [provider]  losartan-hydrochlorothiazide (HYZAAR) 100-12.5 MG tablet Take 1 tablet by mouth daily.  05/19/17 04/08/19  [provider]  metoprolol tartrate (LOPRESSOR) 50 MG tablet Take by mouth. 03/17/18   [provider]  nitrofurantoin, macrocrystal-monohydrate, (MACROBID) 100 MG capsule Take 100 mg by mouth at bedtime.    [provider]  oxyCODONE-acetaminophen (PERCOCET/ROXICET) 5-325 MG tablet Take 1 tablet by mouth every 8 (eight) hours as needed for severe pain. 04/21/19   Zara Council A, PA-C  pantoprazole (PROTONIX) 40 MG tablet Take 40 mg  by mouth daily.  04/28/15   [provider]  predniSONE (DELTASONE) 2.5 MG tablet Take 2.5 mg by mouth daily.     [provider]    Allergies Amoxicillin-pot clavulanate, Codeine, Hydrocodone, Metronidazole, Propoxyphene, and Latex    Social History Social History   Tobacco Use  . Smoking status: Never Smoker  . Smokeless tobacco: Never Used  Substance Use Topics  . Alcohol use: No    Alcohol/week: 0.0 standard drinks  . Drug use: No    Review of Systems Patient denies headaches, rhinorrhea, blurry vision, numbness, shortness of breath, chest pain, edema, cough, abdominal pain, nausea, vomiting, diarrhea, dysuria, fevers, rashes or hallucinations unless otherwise stated above in HPI. ____________________________________________   PHYSICAL EXAM:  VITAL SIGNS: Vitals:   10/04/19 1930 10/04/19 2000  BP: (!) 143/56 (!) 137/59  Pulse: (!) 58 (!) 56  Resp:  18   Temp:    SpO2: 94% 96%    Constitutional: Alert and oriented.  Eyes: Conjunctivae are normal.  Head: Atraumatic. Nose: No congestion/rhinnorhea. Mouth/Throat: Mucous membranes are moist.   Neck: No stridor. Painless ROM.  Cardiovascular: Normal rate, regular rhythm. Grossly normal heart sounds.  Good peripheral circulation. Respiratory: Normal respiratory effort.  No retractions. Lungs CTAB. Gastrointestinal: Soft and nontender. No distention. No abdominal bruits. No CVA tenderness. Genitourinary:  Musculoskeletal: No lower extremity tenderness nor edema.  No joint effusions. Neurologic:  Normal speech and language. No gross focal neurologic deficits are appreciated. No facial droop Skin:  Skin is warm, dry and intact. No rash noted. Psychiatric: Mood and affect are normal. Speech and behavior are normal.  ____________________________________________   LABS (all labs ordered are listed, but only abnormal results are displayed)  Results for orders placed or performed during the hospital  encounter of 10/04/19 (from the past 24 hour(s))  Basic metabolic panel     Status: Abnormal   Collection Time: 10/04/19  6:28 PM  Result Value Ref Range   Sodium 140 135 - 145 mmol/L   Potassium 4.0 3.5 - 5.1 mmol/L   Chloride 105 98 - 111 mmol/L   CO2 25 22 - 32 mmol/L   Glucose, Bld 146 (H) 70 - 99 mg/dL   BUN 22 8 - 23 mg/dL   Creatinine, Ser 1.08 (H) 0.44 - 1.00 mg/dL   Calcium 10.7 (H) 8.9 - 10.3 mg/dL   GFR calc non Af Amer 47 (L) >60 mL/min   GFR calc Af Amer 55 (L) >60 mL/min   Anion gap 10 5 - 15  CBC     Status: Abnormal   Collection Time: 10/04/19  6:28 PM  Result Value Ref Range   WBC 12.2 (H) 4.0 - 10.5 K/uL   RBC 4.19 3.87 - 5.11 MIL/uL   Hemoglobin 13.0 12.0 - 15.0 g/dL   HCT 38.6 36.0 - 46.0 %   MCV 92.1 80.0 - 100.0 fL   MCH 31.0 26.0 - 34.0 pg   MCHC 33.7 30.0 - 36.0 g/dL   RDW 12.1 11.5 - 15.5 %   Platelets 294 150 - 400 K/uL   nRBC 0.0 0.0 - 0.2 %   ____________________________________________ ____________________________________________  RADIOLOGY   ____________________________________________   PROCEDURES  Procedure(s) performed:  Procedures    Critical Care performed: no ____________________________________________   INITIAL IMPRESSION / ASSESSMENT AND PLAN / ED COURSE  Pertinent labs & imaging results that were available during my care of the patient were reviewed by me and considered in my medical decision making (see chart for details).   DDX: htn, noncompliance, medication effect  Dawn Mcintyre is a 86 y.o. who presents to the ED with asymptomatic hypertension.  The patient will be placed on continuous pulse oximetry and telemetry for monitoring.  Laboratory evaluation will be sent to evaluate for the above complaints.     Clinical Course as of Oct 04 2022  Mon Oct 04, 2019  [id number] Patient took an additional clonidine upon arrival and her repeat blood pressures are now normalized.  Will hold any additional antihypertensives and  observe here in the ER   [PR]  2018 Serial blood pressure measurements have been normal.  At this point believe patient stable and appropriate for outpatient follow-up as she is asymptomatic.  Have discussed with the patient and available family all diagnostics and treatments performed thus far and all questions were answered to the best of my ability.  The patient demonstrates understanding and agreement with plan.    [PR]    Clinical Course User Index [PR] Merlyn Lot, MD    The patient was evaluated in Emergency Department today for the symptoms described in the history of present illness. He/she was evaluated in the context of the global COVID-19 pandemic, which necessitated consideration that the patient might be at risk for infection with the SARS-CoV-2 virus that causes COVID-19. Institutional protocols and algorithms that pertain to the evaluation of patients at risk for COVID-19 are in a state of rapid change based on information released by regulatory bodies including the CDC and federal and state organizations. These policies and algorithms were followed during the patient's care in the ED.  As part of my medical decision making, I reviewed the following data within the [place] notes reviewed and incorporated, Labs reviewed, notes from prior ED visits and [place] Controlled Substance Database   ____________________________________________   FINAL CLINICAL IMPRESSION(S) / ED DIAGNOSES  Final diagnoses:  Hypertension, unspecified type      NEW MEDICATIONS STARTED DURING THIS VISIT:  New Prescriptions   No medications on file     Note:  This document was prepared using Dragon voice recognition software and may include unintentional dictation errors.    Merlyn Lot, MD 10/04/19 2024

## 2019-10-11 DIAGNOSIS — I10 Essential (primary) hypertension: Secondary | ICD-10-CM | POA: Diagnosis not present

## 2019-10-14 ENCOUNTER — Encounter: Payer: Self-pay | Admitting: Radiology

## 2019-11-18 DIAGNOSIS — E538 Deficiency of other specified B group vitamins: Secondary | ICD-10-CM | POA: Diagnosis not present

## 2019-11-24 DIAGNOSIS — Z0000 Encounter for general adult medical examination without abnormal findings: Secondary | ICD-10-CM | POA: Diagnosis not present

## 2019-11-24 DIAGNOSIS — Z79899 Other long term (current) drug therapy: Secondary | ICD-10-CM | POA: Diagnosis not present

## 2019-11-24 DIAGNOSIS — I10 Essential (primary) hypertension: Secondary | ICD-10-CM | POA: Diagnosis not present

## 2019-11-24 DIAGNOSIS — E119 Type 2 diabetes mellitus without complications: Secondary | ICD-10-CM | POA: Diagnosis not present

## 2019-11-24 DIAGNOSIS — E785 Hyperlipidemia, unspecified: Secondary | ICD-10-CM | POA: Diagnosis not present

## 2019-11-24 DIAGNOSIS — F419 Anxiety disorder, unspecified: Secondary | ICD-10-CM | POA: Diagnosis not present

## 2019-11-24 DIAGNOSIS — M2550 Pain in unspecified joint: Secondary | ICD-10-CM | POA: Diagnosis not present

## 2019-11-24 DIAGNOSIS — E538 Deficiency of other specified B group vitamins: Secondary | ICD-10-CM | POA: Diagnosis not present

## 2019-12-16 DIAGNOSIS — M7061 Trochanteric bursitis, right hip: Secondary | ICD-10-CM | POA: Diagnosis not present

## 2019-12-20 DIAGNOSIS — E538 Deficiency of other specified B group vitamins: Secondary | ICD-10-CM | POA: Diagnosis not present

## 2020-01-20 DIAGNOSIS — E538 Deficiency of other specified B group vitamins: Secondary | ICD-10-CM | POA: Diagnosis not present

## 2020-01-20 DIAGNOSIS — R399 Unspecified symptoms and signs involving the genitourinary system: Secondary | ICD-10-CM | POA: Diagnosis not present

## 2020-01-26 DIAGNOSIS — R0602 Shortness of breath: Secondary | ICD-10-CM | POA: Diagnosis not present

## 2020-01-26 DIAGNOSIS — N182 Chronic kidney disease, stage 2 (mild): Secondary | ICD-10-CM | POA: Diagnosis not present

## 2020-01-26 DIAGNOSIS — F419 Anxiety disorder, unspecified: Secondary | ICD-10-CM | POA: Diagnosis not present

## 2020-01-26 DIAGNOSIS — N183 Chronic kidney disease, stage 3 unspecified: Secondary | ICD-10-CM | POA: Diagnosis not present

## 2020-01-26 DIAGNOSIS — I208 Other forms of angina pectoris: Secondary | ICD-10-CM | POA: Diagnosis not present

## 2020-01-26 DIAGNOSIS — R011 Cardiac murmur, unspecified: Secondary | ICD-10-CM | POA: Diagnosis not present

## 2020-01-26 DIAGNOSIS — Z9889 Other specified postprocedural states: Secondary | ICD-10-CM | POA: Diagnosis not present

## 2020-01-26 DIAGNOSIS — I739 Peripheral vascular disease, unspecified: Secondary | ICD-10-CM | POA: Diagnosis not present

## 2020-02-21 DIAGNOSIS — E538 Deficiency of other specified B group vitamins: Secondary | ICD-10-CM | POA: Diagnosis not present

## 2020-03-23 DIAGNOSIS — E538 Deficiency of other specified B group vitamins: Secondary | ICD-10-CM | POA: Diagnosis not present

## 2020-03-23 DIAGNOSIS — R221 Localized swelling, mass and lump, neck: Secondary | ICD-10-CM | POA: Diagnosis not present

## 2020-03-23 DIAGNOSIS — E1122 Type 2 diabetes mellitus with diabetic chronic kidney disease: Secondary | ICD-10-CM | POA: Diagnosis not present

## 2020-03-23 DIAGNOSIS — N1831 Chronic kidney disease, stage 3a: Secondary | ICD-10-CM | POA: Diagnosis not present

## 2020-03-23 DIAGNOSIS — I129 Hypertensive chronic kidney disease with stage 1 through stage 4 chronic kidney disease, or unspecified chronic kidney disease: Secondary | ICD-10-CM | POA: Diagnosis not present

## 2020-03-27 DIAGNOSIS — Z03818 Encounter for observation for suspected exposure to other biological agents ruled out: Secondary | ICD-10-CM | POA: Diagnosis not present

## 2020-03-27 DIAGNOSIS — Z1152 Encounter for screening for COVID-19: Secondary | ICD-10-CM | POA: Diagnosis not present

## 2020-03-28 DIAGNOSIS — J028 Acute pharyngitis due to other specified organisms: Secondary | ICD-10-CM | POA: Diagnosis not present

## 2020-03-28 DIAGNOSIS — H6123 Impacted cerumen, bilateral: Secondary | ICD-10-CM | POA: Diagnosis not present

## 2020-04-12 ENCOUNTER — Telehealth (INDEPENDENT_AMBULATORY_CARE_PROVIDER_SITE_OTHER): Payer: Self-pay

## 2020-04-12 NOTE — Telephone Encounter (Signed)
I returned the pts call about a missed call from the answering service and she made me aware that she will have to call back and reschedule the appointment  Please cancel the appointment for this patient tomorrow 9/9.

## 2020-04-12 NOTE — Telephone Encounter (Signed)
Called patient and cancelled the appt. Patient will call back to r/s

## 2020-04-13 ENCOUNTER — Ambulatory Visit (INDEPENDENT_AMBULATORY_CARE_PROVIDER_SITE_OTHER): Payer: Medicare Other | Admitting: Vascular Surgery

## 2020-04-13 ENCOUNTER — Encounter (INDEPENDENT_AMBULATORY_CARE_PROVIDER_SITE_OTHER): Payer: Medicare Other

## 2020-04-24 DIAGNOSIS — I129 Hypertensive chronic kidney disease with stage 1 through stage 4 chronic kidney disease, or unspecified chronic kidney disease: Secondary | ICD-10-CM | POA: Diagnosis not present

## 2020-04-24 DIAGNOSIS — Z79899 Other long term (current) drug therapy: Secondary | ICD-10-CM | POA: Diagnosis not present

## 2020-04-24 DIAGNOSIS — E7800 Pure hypercholesterolemia, unspecified: Secondary | ICD-10-CM | POA: Diagnosis not present

## 2020-04-24 DIAGNOSIS — Z23 Encounter for immunization: Secondary | ICD-10-CM | POA: Diagnosis not present

## 2020-04-24 DIAGNOSIS — E1122 Type 2 diabetes mellitus with diabetic chronic kidney disease: Secondary | ICD-10-CM | POA: Diagnosis not present

## 2020-04-24 DIAGNOSIS — E1165 Type 2 diabetes mellitus with hyperglycemia: Secondary | ICD-10-CM | POA: Diagnosis not present

## 2020-04-24 DIAGNOSIS — N1831 Chronic kidney disease, stage 3a: Secondary | ICD-10-CM | POA: Diagnosis not present

## 2020-05-01 DIAGNOSIS — G8929 Other chronic pain: Secondary | ICD-10-CM | POA: Diagnosis not present

## 2020-05-01 DIAGNOSIS — M7061 Trochanteric bursitis, right hip: Secondary | ICD-10-CM | POA: Diagnosis not present

## 2020-05-01 DIAGNOSIS — M8949 Other hypertrophic osteoarthropathy, multiple sites: Secondary | ICD-10-CM | POA: Diagnosis not present

## 2020-05-01 DIAGNOSIS — M25561 Pain in right knee: Secondary | ICD-10-CM | POA: Diagnosis not present

## 2020-05-25 DIAGNOSIS — I776 Arteritis, unspecified: Secondary | ICD-10-CM | POA: Diagnosis not present

## 2020-05-25 DIAGNOSIS — E538 Deficiency of other specified B group vitamins: Secondary | ICD-10-CM | POA: Diagnosis not present

## 2020-06-08 DIAGNOSIS — I779 Disorder of arteries and arterioles, unspecified: Secondary | ICD-10-CM | POA: Diagnosis not present

## 2020-06-08 DIAGNOSIS — G8929 Other chronic pain: Secondary | ICD-10-CM | POA: Diagnosis not present

## 2020-06-08 DIAGNOSIS — M5030 Other cervical disc degeneration, unspecified cervical region: Secondary | ICD-10-CM | POA: Diagnosis not present

## 2020-06-20 DIAGNOSIS — I10 Essential (primary) hypertension: Secondary | ICD-10-CM | POA: Diagnosis not present

## 2020-06-26 DIAGNOSIS — E538 Deficiency of other specified B group vitamins: Secondary | ICD-10-CM | POA: Diagnosis not present

## 2020-07-03 ENCOUNTER — Other Ambulatory Visit: Payer: Self-pay

## 2020-07-03 ENCOUNTER — Ambulatory Visit (INDEPENDENT_AMBULATORY_CARE_PROVIDER_SITE_OTHER): Payer: Medicare HMO

## 2020-07-03 ENCOUNTER — Encounter (INDEPENDENT_AMBULATORY_CARE_PROVIDER_SITE_OTHER): Payer: Self-pay | Admitting: Vascular Surgery

## 2020-07-03 ENCOUNTER — Ambulatory Visit (INDEPENDENT_AMBULATORY_CARE_PROVIDER_SITE_OTHER): Payer: Medicare HMO | Admitting: Vascular Surgery

## 2020-07-03 VITALS — BP 176/76 | HR 61 | Ht 67.0 in | Wt 134.0 lb

## 2020-07-03 DIAGNOSIS — I6523 Occlusion and stenosis of bilateral carotid arteries: Secondary | ICD-10-CM | POA: Diagnosis not present

## 2020-07-03 DIAGNOSIS — E1159 Type 2 diabetes mellitus with other circulatory complications: Secondary | ICD-10-CM

## 2020-07-03 DIAGNOSIS — I739 Peripheral vascular disease, unspecified: Secondary | ICD-10-CM

## 2020-07-03 DIAGNOSIS — I25118 Atherosclerotic heart disease of native coronary artery with other forms of angina pectoris: Secondary | ICD-10-CM

## 2020-07-03 DIAGNOSIS — I10 Essential (primary) hypertension: Secondary | ICD-10-CM

## 2020-07-04 ENCOUNTER — Encounter (INDEPENDENT_AMBULATORY_CARE_PROVIDER_SITE_OTHER): Payer: Self-pay | Admitting: Vascular Surgery

## 2020-07-04 NOTE — Progress Notes (Signed)
MRN : [id number]  Dawn Mcintyre is a 86 y.o. ([birth year]) female who presents with chief complaint of  Chief Complaint  Patient presents with  . Follow-up    1 yr F/U   .  History of Present Illness:   The patient is seen for follow up evaluation of carotid stenosis status postrightcarotid endarterectomy on 03/13/2018. There were no post operative problems or complications related to the surgery. The patient denies neck or incisional pain.  Rightcarotid endarterectomywas treated with primaryclosure  The patient denies interval amaurosis fugax. There is no recent history of TIA symptoms or focal motor deficits. There is no prior documented CVA.  The patient denies headache.  The patient is taking enteric-coated aspirin 81 mg daily.  The patient has a history of coronary artery disease, no recent episodes of angina or shortness of breath. The patient denies PAD or claudication symptoms. There is a history of hyperlipidemia which is being treated with a statin.  Carotid Duplex <40% bilateral ICA stenosis stable no significant change compared to last study.  Previous ABI Rt=1.11 and Lt=1.10 Triphasic  Current Meds  Medication Sig  . acetaminophen (TYLENOL) 500 MG tablet Take 500 mg by mouth daily as needed for headache.  . ALPRAZolam (XANAX) 0.25 MG tablet Take 0.25 mg by mouth at bedtime as needed for sleep.   Marland Kitchen amLODipine (NORVASC) 5 MG tablet TAKE 1.5 TABLETS (7.5 MG TOTAL) BY MOUTH ONCE DAILY DISCONTINUE LOSARTAN-HCTZ  . ascorbic acid (VITAMIN C) 1000 MG tablet Take by mouth.  Marland Kitchen aspirin 81 MG tablet Take 81 mg by mouth daily.    . Boric Acid CRYS Place 600 mg vaginally at bedtime. Use one capsule vaginally every night for two weeks then twice a week for six months  . Cholecalciferol (VITAMIN D3) 1.25 MG (50000 UT) CAPS Take by mouth.  . clotrimazole-betamethasone (LOTRISONE) cream Apply topically.  . conjugated estrogens (PREMARIN) vaginal cream USE 1 GRAM  VAGINALLY EVERY OTHER NIGHT  . felodipine (PLENDIL) 2.5 MG 24 hr tablet Take 2.5 mg by mouth daily. May take a second 2.5 mg dose as needed if bp is over 150/90  . fexofenadine (ALLEGRA) 180 MG tablet Take 180 mg by mouth daily as needed for allergies.   . fluconazole (DIFLUCAN) 150 MG tablet Take 1 tablet (150 mg total) by mouth every 3 (three) days. For three doses  . gabapentin (NEURONTIN) 100 MG capsule Take 100 mg by mouth at bedtime.   . hydrochlorothiazide (HYDRODIURIL) 12.5 MG tablet Take 1 tablet by mouth daily.  . isosorbide mononitrate (IMDUR) 60 MG 24 hr tablet Take 60 mg by mouth daily.    Marland Kitchen losartan (COZAAR) 100 MG tablet Take 1 tablet by mouth daily.  . mirabegron ER (MYRBETRIQ) 25 MG TB24 tablet Take by mouth.  . nitrofurantoin, macrocrystal-monohydrate, (MACROBID) 100 MG capsule Take 100 mg by mouth at bedtime.  Marland Kitchen oxyCODONE-acetaminophen (PERCOCET/ROXICET) 5-325 MG tablet Take 1 tablet by mouth every 8 (eight) hours as needed for severe pain.  . pantoprazole (PROTONIX) 40 MG tablet Take 40 mg by mouth daily.   . predniSONE (DELTASONE) 2.5 MG tablet Take 2.5 mg by mouth daily.   Marland Kitchen senna-docusate (SENOKOT-S) 8.6-50 MG tablet Take 1 tablet by mouth daily.  . vitamin E 180 MG (400 UNITS) capsule Take by mouth.  . Zinc 10 MG LOZG Take by mouth.  . [DISCONTINUED] metoprolol tartrate (LOPRESSOR) 50 MG tablet Take by mouth.    Past Medical History:  Diagnosis Date  . Arteritis (  [place])   . Arthritis   . Atrophic vaginitis 11/16/2014  . Carotid artery stenosis   . Cataract   . Difficult intubation   . Environmental and seasonal allergies   . GERD (gastroesophageal reflux disease)   . Hyperlipidemia   . Hypertension   . Lack of bladder control   . Neuropathy   . Osteoporosis   . Pneumonia     x 96yrs ago  . Pre-diabetes   . Ulcer     Past Surgical History:  Procedure Laterality Date  . ABDOMINAL HYSTERECTOMY  1972  . BREAST CYST EXCISION Right 1972  . BREAST CYST  EXCISION Left 1972  . ENDARTERECTOMY Left 12/24/2017   Procedure: ENDARTERECTOMY CAROTID;  Surgeon: Katha Cabal, MD;  Location: [facility] ORS;  Service: Vascular;  Laterality: Left;  . ENDARTERECTOMY Right 03/13/2018   Procedure: ENDARTERECTOMY CAROTID;  Surgeon: Katha Cabal, MD;  Location: [facility] ORS;  Service: Vascular;  Laterality: Right;  . EYE SURGERY    . THROAT SURGERY     x 10 yrs ago. throat mass.  . TONSILLECTOMY      Social History Social History   Tobacco Use  . Smoking status: Never Smoker  . Smokeless tobacco: Never Used  Vaping Use  . Vaping Use: Never used  Substance Use Topics  . Alcohol use: No    Alcohol/week: 0.0 standard drinks  . Drug use: No    Family History Family History  Problem Relation Age of Onset  . Anuerysm Mother   . Cancer Father        prostrate cancer  . Diabetes Brother   . Hearing loss Brother   . Bladder Cancer Neg Hx   . Kidney cancer Neg Hx     Allergies  Allergen Reactions  . Amoxicillin-Pot Clavulanate Other (See Comments)    Unknown reaction Has patient had a PCN reaction causing immediate rash, facial/tongue/throat swelling, SOB or lightheadedness with hypotension: Unknown Has patient had a PCN reaction causing severe rash involving mucus membranes or skin necrosis: Unknown Has patient had a PCN reaction that required hospitalization: Unknown Has patient had a PCN reaction occurring within the last 10 years: No If all of the above answers are "NO", then may proceed with Cephalosporin use.   . Codeine Hives  . Hydrocodone Nausea Only  . Metronidazole Nausea Only  . Propoxyphene Nausea Only  . Latex Rash     REVIEW OF SYSTEMS (Negative unless checked)  Constitutional: [] Weight loss  [] Fever  [] Chills Cardiac: [] Chest pain   [] Chest pressure   [] Palpitations   [] Shortness of breath when laying flat   [] Shortness of breath with exertion. Vascular:  [] Pain in legs with walking   [] Pain in legs at rest  [] History of  DVT   [] Phlebitis   [] Swelling in legs   [] Varicose veins   [] Non-healing ulcers Pulmonary:   [] Uses home oxygen   [] Productive cough   [] Hemoptysis   [] Wheeze  [] COPD   [] Asthma Neurologic:  [] Dizziness   [] Seizures   [] History of stroke   [] History of TIA  [] Aphasia   [] Vissual changes   [] Weakness or numbness in arm   [] Weakness or numbness in leg Musculoskeletal:   [] Joint swelling   [x] Joint pain   [] Low back pain Hematologic:  [] Easy bruising  [] Easy bleeding   [] Hypercoagulable state   [] Anemic Gastrointestinal:  [] Diarrhea   [] Vomiting  [] Gastroesophageal reflux/heartburn   [] Difficulty swallowing. Genitourinary:  [] Chronic kidney disease   [] Difficult urination  [] Frequent urination   []   Blood in urine Skin:  [] Rashes   [] Ulcers  Psychological:  [] History of anxiety   []  History of major depression.  Physical Examination  Vitals:   07/03/20 1544  BP: (!) 176/76  Pulse: 61  Weight: 134 lb (60.8 kg)  Height: 5\' 7"  (1.702 m)   Body mass index is 20.99 kg/m. Gen: WD/WN, NAD Head: [place]/AT, No temporalis wasting.  Ear/Nose/Throat: Hearing grossly intact, nares w/o erythema or drainage Eyes: PER, EOMI, sclera nonicteric.  Neck: Supple, no large masses.   Pulmonary:  Good air movement, no audible wheezing bilaterally, no use of accessory muscles.  Cardiac: RRR, no JVD Vascular: No carotid bruits auscultated well-healed right carotid endarterectomy incisional scar Vessel Right Left  Radial Palpable Palpable  Carotid Palpable Palpable  Gastrointestinal: Non-distended. No guarding/no peritoneal signs.  Musculoskeletal: M/S 5/5 throughout.  No deformity or atrophy.  Neurologic: CN 2-12 intact. Symmetrical.  Speech is fluent. Motor exam as listed above. Psychiatric: Judgment intact, Mood & affect appropriate for pt's clinical situation. Dermatologic: No rashes or ulcers noted.  No changes consistent with cellulitis.  CBC Lab Results  Component Value Date   WBC 12.2 (H) 10/04/2019    HGB 13.0 10/04/2019   HCT 38.6 10/04/2019   MCV 92.1 10/04/2019   PLT 294 10/04/2019    BMET    Component Value Date/Time   NA 140 10/04/2019 1828   NA 139 09/12/2011 0250   K 4.0 10/04/2019 1828   K 3.5 09/12/2011 0250   CL 105 10/04/2019 1828   CL 102 09/12/2011 0250   CO2 25 10/04/2019 1828   CO2 23 09/12/2011 0250   GLUCOSE 146 (H) 10/04/2019 1828   GLUCOSE 162 (H) 09/12/2011 0250   BUN 22 10/04/2019 1828   BUN 20 05/30/2016 1358   BUN 23 (H) 09/12/2011 0250   CREATININE 1.08 (H) 10/04/2019 1828   CREATININE 0.94 09/12/2011 0250   CALCIUM 10.7 (H) 10/04/2019 1828   CALCIUM 9.1 09/12/2011 0250   GFRNONAA 47 (L) 10/04/2019 1828   GFRNONAA >60 09/12/2011 0250   GFRAA 55 (L) 10/04/2019 1828   GFRAA >60 09/12/2011 0250   CrCl cannot be calculated (Patient's most recent lab result is older than the maximum 21 days allowed.).  COAG Lab Results  Component Value Date   INR 0.99 03/05/2018   INR 1.02 12/16/2017    Radiology No results found.   Assessment/Plan 1. Carotid stenosis, bilateral Recommend:  Given the patient's asymptomatic subcritical stenosis no further invasive testing or surgery at this time.  Previous duplex ultrasound shows<50%stenosis bilaterally.  Continue antiplatelet therapy as prescribed Continue management of CAD, HTN and Hyperlipidemia Healthy heart diet, encouraged exercise at least 4 times per week  Follow up in94months with duplex ultrasound and physical exam - VAS US CAROTID; Future  2. PAD (peripheral artery disease) (HCC) Recommend:  The patient has evidence of atherosclerosis of the lower extremities with claudication. The patient does not voice lifestyle limiting changes at this point in time.  Noninvasive studies do not suggest clinically significant change.  No invasive studies, angiography or surgery at this time The patient should continue walking and begin a more formal exercise program.  The patient should  continue antiplatelet therapy and aggressive treatment of the lipid abnormalities  No changes in the patient's medications at this time  The patient should continue wearing graduated compression socks 10-15 mmHg strength to control the mild edema.  3. Essential hypertension Continue antihypertensive medications as already ordered, these medications have been reviewed and there  are no changes at this time.   4. Coronary artery disease of native artery of native heart with stable angina pectoris (HCC) Continue cardiac and antihypertensive medications as already ordered and reviewed, no changes at this time.  Continue statin as ordered and reviewed, no changes at this time  Nitrates PRN for chest pain   5. Type 2 diabetes mellitus with other circulatory complication, without long-term current use of insulin (HCC) Continue hypoglycemic medications as already ordered, these medications have been reviewed and there are no changes at this time.  Hgb A1C to be monitored as already arranged by primary service    [name], MD  07/04/2020 1:35 PM

## 2020-07-14 DIAGNOSIS — R0602 Shortness of breath: Secondary | ICD-10-CM | POA: Diagnosis not present

## 2020-07-14 DIAGNOSIS — N182 Chronic kidney disease, stage 2 (mild): Secondary | ICD-10-CM | POA: Diagnosis not present

## 2020-07-14 DIAGNOSIS — I208 Other forms of angina pectoris: Secondary | ICD-10-CM | POA: Diagnosis not present

## 2020-07-14 DIAGNOSIS — I701 Atherosclerosis of renal artery: Secondary | ICD-10-CM | POA: Diagnosis not present

## 2020-07-14 DIAGNOSIS — R011 Cardiac murmur, unspecified: Secondary | ICD-10-CM | POA: Diagnosis not present

## 2020-07-14 DIAGNOSIS — F419 Anxiety disorder, unspecified: Secondary | ICD-10-CM | POA: Diagnosis not present

## 2020-07-14 DIAGNOSIS — Z9889 Other specified postprocedural states: Secondary | ICD-10-CM | POA: Diagnosis not present

## 2020-07-14 DIAGNOSIS — I10 Essential (primary) hypertension: Secondary | ICD-10-CM | POA: Diagnosis not present

## 2020-07-14 DIAGNOSIS — I739 Peripheral vascular disease, unspecified: Secondary | ICD-10-CM | POA: Diagnosis not present

## 2020-07-17 ENCOUNTER — Other Ambulatory Visit: Payer: Self-pay | Admitting: Internal Medicine

## 2020-07-17 DIAGNOSIS — I701 Atherosclerosis of renal artery: Secondary | ICD-10-CM

## 2020-07-19 ENCOUNTER — Ambulatory Visit
Admission: RE | Admit: 2020-07-19 | Discharge: 2020-07-19 | Disposition: A | Discharge status: Home / Self Care | Payer: Medicare HMO | Source: Ambulatory Visit | Attending: Internal Medicine | Admitting: Internal Medicine

## 2020-07-19 ENCOUNTER — Other Ambulatory Visit: Payer: Self-pay

## 2020-07-19 DIAGNOSIS — I700 Atherosclerosis of aorta: Secondary | ICD-10-CM | POA: Diagnosis not present

## 2020-07-19 DIAGNOSIS — N281 Cyst of kidney, acquired: Secondary | ICD-10-CM | POA: Insufficient documentation

## 2020-07-19 DIAGNOSIS — I701 Atherosclerosis of renal artery: Secondary | ICD-10-CM

## 2020-07-20 DIAGNOSIS — M7061 Trochanteric bursitis, right hip: Secondary | ICD-10-CM | POA: Diagnosis not present

## 2020-07-24 ENCOUNTER — Other Ambulatory Visit: Payer: Self-pay | Admitting: Internal Medicine

## 2020-07-24 DIAGNOSIS — Z1231 Encounter for screening mammogram for malignant neoplasm of breast: Secondary | ICD-10-CM | POA: Diagnosis not present

## 2020-07-24 DIAGNOSIS — R011 Cardiac murmur, unspecified: Secondary | ICD-10-CM | POA: Diagnosis not present

## 2020-07-24 DIAGNOSIS — I739 Peripheral vascular disease, unspecified: Secondary | ICD-10-CM | POA: Diagnosis not present

## 2020-07-24 DIAGNOSIS — F419 Anxiety disorder, unspecified: Secondary | ICD-10-CM | POA: Diagnosis not present

## 2020-07-24 DIAGNOSIS — N183 Chronic kidney disease, stage 3 unspecified: Secondary | ICD-10-CM | POA: Diagnosis not present

## 2020-07-24 DIAGNOSIS — I10 Essential (primary) hypertension: Secondary | ICD-10-CM | POA: Diagnosis not present

## 2020-07-24 DIAGNOSIS — I208 Other forms of angina pectoris: Secondary | ICD-10-CM | POA: Diagnosis not present

## 2020-07-24 DIAGNOSIS — I701 Atherosclerosis of renal artery: Secondary | ICD-10-CM | POA: Diagnosis not present

## 2020-07-24 DIAGNOSIS — E538 Deficiency of other specified B group vitamins: Secondary | ICD-10-CM | POA: Diagnosis not present

## 2020-07-24 DIAGNOSIS — N182 Chronic kidney disease, stage 2 (mild): Secondary | ICD-10-CM | POA: Diagnosis not present

## 2020-07-24 DIAGNOSIS — R0602 Shortness of breath: Secondary | ICD-10-CM | POA: Diagnosis not present

## 2020-07-24 DIAGNOSIS — N1831 Chronic kidney disease, stage 3a: Secondary | ICD-10-CM | POA: Diagnosis not present

## 2020-07-24 DIAGNOSIS — E7800 Pure hypercholesterolemia, unspecified: Secondary | ICD-10-CM | POA: Diagnosis not present

## 2020-07-24 DIAGNOSIS — Z79899 Other long term (current) drug therapy: Secondary | ICD-10-CM | POA: Diagnosis not present

## 2020-07-24 DIAGNOSIS — Z9889 Other specified postprocedural states: Secondary | ICD-10-CM | POA: Diagnosis not present

## 2020-07-24 DIAGNOSIS — E1122 Type 2 diabetes mellitus with diabetic chronic kidney disease: Secondary | ICD-10-CM | POA: Diagnosis not present

## 2020-10-18 ENCOUNTER — Other Ambulatory Visit: Payer: Self-pay | Admitting: Physician Assistant

## 2020-10-18 DIAGNOSIS — N952 Postmenopausal atrophic vaginitis: Secondary | ICD-10-CM

## 2020-11-03 ENCOUNTER — Other Ambulatory Visit: Payer: Self-pay

## 2020-11-03 ENCOUNTER — Ambulatory Visit
Admission: RE | Admit: 2020-11-03 | Discharge: 2020-11-03 | Disposition: A | Discharge status: Home / Self Care | Payer: Medicare Other | Source: Ambulatory Visit | Attending: Family Medicine | Admitting: Family Medicine

## 2020-11-03 ENCOUNTER — Other Ambulatory Visit: Payer: Self-pay | Admitting: Family Medicine

## 2020-11-03 DIAGNOSIS — R1032 Left lower quadrant pain: Secondary | ICD-10-CM | POA: Insufficient documentation

## 2020-11-03 MED ORDER — IOHEXOL 300 MG/ML  SOLN
75.0000 mL | Freq: Once | INTRAMUSCULAR | Status: AC | PRN
  Administered 2020-11-03: 75 mL via INTRAVENOUS

## 2020-11-04 ENCOUNTER — Other Ambulatory Visit: Payer: Self-pay

## 2020-11-04 ENCOUNTER — Emergency Department: Payer: Medicare Other

## 2020-11-04 ENCOUNTER — Emergency Department
Admission: EM | Admit: 2020-11-04 | Discharge: 2020-11-04 | Disposition: A | Discharge status: Home / Self Care | Payer: Medicare Other | Attending: Emergency Medicine | Admitting: Emergency Medicine

## 2020-11-04 DIAGNOSIS — I129 Hypertensive chronic kidney disease with stage 1 through stage 4 chronic kidney disease, or unspecified chronic kidney disease: Secondary | ICD-10-CM | POA: Diagnosis not present

## 2020-11-04 DIAGNOSIS — I2510 Atherosclerotic heart disease of native coronary artery without angina pectoris: Secondary | ICD-10-CM | POA: Diagnosis not present

## 2020-11-04 DIAGNOSIS — R1032 Left lower quadrant pain: Secondary | ICD-10-CM | POA: Insufficient documentation

## 2020-11-04 DIAGNOSIS — Z91040 Latex allergy status: Secondary | ICD-10-CM | POA: Insufficient documentation

## 2020-11-04 DIAGNOSIS — E1122 Type 2 diabetes mellitus with diabetic chronic kidney disease: Secondary | ICD-10-CM | POA: Diagnosis not present

## 2020-11-04 DIAGNOSIS — E1140 Type 2 diabetes mellitus with diabetic neuropathy, unspecified: Secondary | ICD-10-CM | POA: Diagnosis not present

## 2020-11-04 DIAGNOSIS — R110 Nausea: Secondary | ICD-10-CM | POA: Diagnosis not present

## 2020-11-04 DIAGNOSIS — Z79899 Other long term (current) drug therapy: Secondary | ICD-10-CM | POA: Diagnosis not present

## 2020-11-04 DIAGNOSIS — Z7982 Long term (current) use of aspirin: Secondary | ICD-10-CM | POA: Insufficient documentation

## 2020-11-04 DIAGNOSIS — N183 Chronic kidney disease, stage 3 unspecified: Secondary | ICD-10-CM | POA: Insufficient documentation

## 2020-11-04 LAB — CBC WITH DIFFERENTIAL/PLATELET
Abs Immature Granulocytes: 0.03 10*3/uL (ref 0.00–0.07)
Basophils Absolute: 0.1 10*3/uL (ref 0.0–0.1)
Basophils Relative: 1 %
Eosinophils Absolute: 0.1 10*3/uL (ref 0.0–0.5)
Eosinophils Relative: 1 %
HCT: 36.5 % (ref 36.0–46.0)
Hemoglobin: 12.4 g/dL (ref 12.0–15.0)
Immature Granulocytes: 0 %
Lymphocytes Relative: 23 %
Lymphs Abs: 1.8 10*3/uL (ref 0.7–4.0)
MCH: 31.6 pg (ref 26.0–34.0)
MCHC: 34 g/dL (ref 30.0–36.0)
MCV: 92.9 fL (ref 80.0–100.0)
Monocytes Absolute: 0.7 10*3/uL (ref 0.1–1.0)
Monocytes Relative: 9 %
Neutro Abs: 5.4 10*3/uL (ref 1.7–7.7)
Neutrophils Relative %: 66 %
Platelets: 209 10*3/uL (ref 150–400)
RBC: 3.93 MIL/uL (ref 3.87–5.11)
RDW: 12.3 % (ref 11.5–15.5)
WBC: 8 10*3/uL (ref 4.0–10.5)
nRBC: 0 % (ref 0.0–0.2)

## 2020-11-04 LAB — HEPATIC FUNCTION PANEL
ALT: 13 U/L (ref 0–44)
AST: 18 U/L (ref 15–41)
Albumin: 4.3 g/dL (ref 3.5–5.0)
Alkaline Phosphatase: 61 U/L (ref 38–126)
Bilirubin, Direct: 0.1 mg/dL (ref 0.0–0.2)
Indirect Bilirubin: 0.8 mg/dL (ref 0.3–0.9)
Total Bilirubin: 0.9 mg/dL (ref 0.3–1.2)
Total Protein: 7.1 g/dL (ref 6.5–8.1)

## 2020-11-04 LAB — BASIC METABOLIC PANEL
Anion gap: 10 (ref 5–15)
BUN: 11 mg/dL (ref 8–23)
CO2: 26 mmol/L (ref 22–32)
Calcium: 10.4 mg/dL — ABNORMAL HIGH (ref 8.9–10.3)
Chloride: 98 mmol/L (ref 98–111)
Creatinine, Ser: 0.88 mg/dL (ref 0.44–1.00)
GFR, Estimated: >60 mL/min (ref >60)
Glucose, Bld: 122 mg/dL — ABNORMAL HIGH (ref 70–99)
Potassium: 3.1 mmol/L — ABNORMAL LOW (ref 3.5–5.1)
Sodium: 134 mmol/L — ABNORMAL LOW (ref 135–145)

## 2020-11-04 LAB — URINALYSIS, COMPLETE (UACMP) WITH MICROSCOPIC
Bilirubin Urine: NEGATIVE
Glucose, UA: NEGATIVE mg/dL
Ketones, ur: NEGATIVE mg/dL
Leukocytes,Ua: NEGATIVE
Nitrite: NEGATIVE
Protein, ur: NEGATIVE mg/dL
Specific Gravity, Urine: 1.027 (ref 1.005–1.030)
pH: 6 (ref 5.0–8.0)

## 2020-11-04 LAB — LACTIC ACID, PLASMA: Lactic Acid, Venous: 1.1 mmol/L (ref 0.5–1.9)

## 2020-11-04 LAB — TROPONIN I (HIGH SENSITIVITY): Troponin I (High Sensitivity): 8 ng/L (ref <18)

## 2020-11-04 LAB — LIPASE, BLOOD: Lipase: 30 U/L (ref 11–51)

## 2020-11-04 MED ORDER — MORPHINE SULFATE (PF) 4 MG/ML IV SOLN
4.0000 mg | Freq: Once | INTRAVENOUS | Status: AC
  Administered 2020-11-04: 4 mg via INTRAVENOUS
  Filled 2020-11-04: qty 1

## 2020-11-04 MED ORDER — HYDROCODONE-ACETAMINOPHEN 5-325 MG PO TABS: 1.0000 | ORAL_TABLET | Freq: Four times a day (QID) | ORAL | 0 refills | Status: DC | PRN

## 2020-11-04 MED ORDER — IOHEXOL 300 MG/ML  SOLN
75.0000 mL | Freq: Once | INTRAMUSCULAR | Status: AC | PRN
  Administered 2020-11-04: 75 mL via INTRAVENOUS

## 2020-11-04 MED ORDER — IOHEXOL 9 MG/ML PO SOLN
500.0000 mL | Freq: Once | ORAL | Status: AC
  Administered 2020-11-04: 500 mL via ORAL

## 2020-11-04 MED ORDER — ONDANSETRON HCL 4 MG/2ML IJ SOLN
4.0000 mg | Freq: Once | INTRAMUSCULAR | Status: AC
  Administered 2020-11-04: 4 mg via INTRAVENOUS
  Filled 2020-11-04: qty 2

## 2020-11-04 MED ORDER — DOCUSATE SODIUM 100 MG PO CAPS: 100.0000 mg | ORAL_CAPSULE | Freq: Two times a day (BID) | ORAL | 0 refills | Status: DC

## 2020-11-04 NOTE — ED Notes (Signed)
Called CT to come get pt for CT scans.

## 2020-11-04 NOTE — ED Notes (Signed)
Pt ambulated to the restroom with steady gait. No assistance needed.

## 2020-11-04 NOTE — ED Notes (Signed)
Dr. Kerman Passey, MD at bedside for reevaluation.

## 2020-11-04 NOTE — ED Provider Notes (Signed)
[facility] Emergency Department Provider Note ____________________________________________   Event Date/Time   First MD Initiated Contact with Patient 11/04/20 0518     (approximate)  I have reviewed the triage vital signs and the nursing notes.   HISTORY  Chief Complaint Abdominal Pain    HPI Dawn Mcintyre is a 87 y.o. female with PMH as noted below who presents with left lower quadrant abdominal pain for approximately 24 hours, persistent course, nonradiating, and associated with nausea but no vomiting.  The patient last had a bowel movement the day before yesterday and it was normal.  She denies any prior history of this pain.  She has no fever or chills.  She was seen at [facility] clinic yesterday and had lab work and a CT but did not know the results.  Past Medical History:  Diagnosis Date  . Arteritis ([place])   . Arthritis   . Atrophic vaginitis 11/16/2014  . Carotid artery stenosis   . Cataract   . Difficult intubation   . Environmental and seasonal allergies   . GERD (gastroesophageal reflux disease)   . Hyperlipidemia   . Hypertension   . Lack of bladder control   . Neuropathy   . Osteoporosis   . Pneumonia     x 12yrs ago  . Pre-diabetes   . Ulcer     Patient Active Problem List   Diagnosis Date Noted  . Greater trochanteric bursitis of right hip 02/02/2019  . Diabetic neuropathy ([place]) 12/17/2018  . PAD (peripheral artery disease) ([place]) 10/05/2018  . Urinary incontinence in female 09/08/2018  . Carotid stenosis, bilateral 12/24/2017  . Syncope and collapse 11/15/2017  . CAD (coronary artery disease) 11/15/2017  . Leg pain 10/26/2017  . Bilateral carotid artery disease ([place]) 07/11/2016  . Primary osteoarthritis involving multiple joints 07/11/2016  . Pure hypercholesterolemia 07/11/2016  . Carotid stenosis 07/01/2016  . Hyperlipidemia 07/01/2016  . Peripheral sensory neuropathy ([place]) 03/25/2016  . Type 2 diabetes mellitus with  stage 3 chronic kidney disease, without long-term current use of insulin ([place]) 03/25/2016  . Anxiety 11/24/2015  . Sensory peripheral neuropathy ([place]) 10/30/2015  . Influenza 10/15/2015  . CKD (chronic kidney disease) stage 3, GFR 30-59 ml/min ([facility]) 07/19/2015  . Osteopenia 07/19/2015  . Chronic insomnia 01/25/2015  . Diet-controlled type 2 diabetes mellitus ([place]) 01/25/2015  . Gastroesophageal reflux disease without esophagitis 01/25/2015  . Mixed stress and urge urinary incontinence 01/25/2015  . Atrophic vaginitis 11/16/2014  . Vitamin B 12 deficiency 10/26/2014  . Abdominal pain, chronic, epigastric 06/15/2014  . Essential hypertension 04/28/2014  . Sciatica of left side 04/07/2014  . HTN (hypertension) 01/28/2014  . Type 2 diabetes mellitus ([place]) 01/28/2014    Past Surgical History:  Procedure Laterality Date  . ABDOMINAL HYSTERECTOMY  1972  . BREAST CYST EXCISION Right 1972  . BREAST CYST EXCISION Left 1972  . ENDARTERECTOMY Left 12/24/2017   Procedure: ENDARTERECTOMY CAROTID;  Surgeon: Katha Cabal, MD;  Location: [facility] ORS;  Service: Vascular;  Laterality: Left;  . ENDARTERECTOMY Right 03/13/2018   Procedure: ENDARTERECTOMY CAROTID;  Surgeon: Katha Cabal, MD;  Location: [facility] ORS;  Service: Vascular;  Laterality: Right;  . EYE SURGERY    . THROAT SURGERY     x 10 yrs ago. throat mass.  . TONSILLECTOMY      Prior to Admission medications   Medication Sig Start Date End Date Taking? Authorizing Provider  acetaminophen (TYLENOL) 500 MG tablet Take 500 mg by mouth daily  as needed for headache.    [provider]  ALPRAZolam Duanne Moron) 0.25 MG tablet Take 0.25 mg by mouth at bedtime as needed for sleep.     [provider]  amLODipine (NORVASC) 5 MG tablet TAKE 1.5 TABLETS (7.5 MG TOTAL) BY MOUTH ONCE DAILY DISCONTINUE LOSARTAN-HCTZ 03/05/18   [provider]  ascorbic acid (VITAMIN C) 1000 MG tablet Take by mouth.    [provider]   aspirin 81 MG tablet Take 81 mg by mouth daily.      [provider]  atorvastatin (LIPITOR) 20 MG tablet Take 20 mg by mouth every evening.  06/25/16 04/08/19  [provider]  Boric Acid CRYS Place 600 mg vaginally at bedtime. Use one capsule vaginally every night for two weeks then twice a week for six months 04/27/19   Anyanwu, Sallyanne Havers, MD  Cholecalciferol (VITAMIN D3) 1.25 MG (50000 UT) CAPS Take by mouth.    [provider]  clotrimazole-betamethasone (LOTRISONE) cream Apply topically. 11/24/19 11/23/20  [provider]  conjugated estrogens (PREMARIN) vaginal cream Apply one pea-sized amount around the opening of the urethra three times weekly. 10/19/20   Vaillancourt, Aldona Bar, PA-C  felodipine (PLENDIL) 2.5 MG 24 hr tablet Take 2.5 mg by mouth daily. May take a second 2.5 mg dose as needed if bp is over 150/90    [provider]  fexofenadine (ALLEGRA) 180 MG tablet Take 180 mg by mouth daily as needed for allergies.     [provider]  fluconazole (DIFLUCAN) 150 MG tablet Take 1 tablet (150 mg total) by mouth every 3 (three) days. For three doses 08/17/19   Sloan Leiter, MD  fluticasone [facility]) 50 MCG/ACT nasal spray Place 2 sprays into the nose daily as needed for allergies.  02/19/15 04/08/19  [provider]  gabapentin (NEURONTIN) 100 MG capsule Take 100 mg by mouth at bedtime.     [provider]  hydrochlorothiazide (HYDRODIURIL) 12.5 MG tablet Take 1 tablet by mouth daily. 06/15/20   [provider]  isosorbide mononitrate (IMDUR) 60 MG 24 hr tablet Take 60 mg by mouth daily.      [provider]  losartan (COZAAR) 100 MG tablet Take 1 tablet by mouth daily. 06/15/20   [provider]  mirabegron ER (MYRBETRIQ) 25 MG TB24 tablet Take by mouth. 11/24/19   [provider]  nitrofurantoin, macrocrystal-monohydrate, (MACROBID) 100 MG capsule Take 100 mg by mouth at bedtime.     [provider]  oxyCODONE-acetaminophen (PERCOCET/ROXICET) 5-325 MG tablet Take 1 tablet by mouth every 8 (eight) hours as needed for severe pain. 04/21/19   Zara Council A, PA-C  pantoprazole (PROTONIX) 40 MG tablet Take 40 mg by mouth daily.  04/28/15   [provider]  predniSONE (DELTASONE) 2.5 MG tablet Take 2.5 mg by mouth daily.     [provider]  senna-docusate (SENOKOT-S) 8.6-50 MG tablet Take 1 tablet by mouth daily. 08/24/19   [provider]  vitamin E 180 MG (400 UNITS) capsule Take by mouth.    [provider]  Zinc 10 MG LOZG Take by mouth.    [provider]    Allergies Amoxicillin-pot clavulanate, Codeine, Hydrocodone, Metronidazole, Propoxyphene, and Latex  Family History  Problem Relation Age of Onset  . Anuerysm Mother   . Cancer Father        prostrate cancer  . Diabetes Brother   . Hearing loss Brother   . Bladder Cancer Neg Hx   .  Kidney cancer Neg Hx     Social History Social History   Tobacco Use  . Smoking status: Never Smoker  . Smokeless tobacco: Never Used  Vaping Use  . Vaping Use: Never used  Substance Use Topics  . Alcohol use: No    Alcohol/week: 0.0 standard drinks  . Drug use: No    Review of Systems  Constitutional: No fever. Eyes: No redness. ENT: No sore throat. Cardiovascular: Denies chest pain. Respiratory: Denies shortness of breath. Gastrointestinal: Positive for nausea.  No vomiting or diarrhea. Genitourinary: Negative for dysuria.  Musculoskeletal: Negative for back pain. Skin: Negative for rash. Neurological: Negative for headache.   ____________________________________________   PHYSICAL EXAM:  VITAL SIGNS: ED Triage Vitals  Enc Vitals Group     BP 11/04/20 0510 (!) 168/73     Pulse Rate 11/04/20 0510 61     Resp 11/04/20 0510 18     Temp 11/04/20 0510 98.4 F (36.9 C)     Temp Source 11/04/20 0510 Oral     SpO2 11/04/20 0510 97 %     Weight  11/04/20 0511 138 lb (62.6 kg)     Height 11/04/20 0511 5\' 7"  (1.702 m)     Head Circumference --      Peak Flow --      Pain Score 11/04/20 0511 10     Pain Loc --      Pain Edu? --      Excl. in [place]? --     Constitutional: Alert and oriented.  Uncomfortable appearing but in no acute distress. Eyes: Conjunctivae are normal.  No scleral icterus. Head: Atraumatic. Nose: No congestion/rhinnorhea. Mouth/Throat: Mucous membranes are moist.   Neck: Normal range of motion.  Cardiovascular: Normal rate, regular rhythm.  Good peripheral circulation. Respiratory: Normal respiratory effort.  No retractions. Gastrointestinal: Soft with mild left lower quadrant tenderness.  No distention.  Genitourinary: No flank tenderness. Musculoskeletal: Extremities warm and well perfused.  Neurologic:  Normal speech and language. No gross focal neurologic deficits are appreciated.  Skin:  Skin is warm and dry. No rash noted. Psychiatric: Mood and affect are normal. Speech and behavior are normal.  ____________________________________________   LABS (all labs ordered are listed, but only abnormal results are displayed)  Labs Reviewed  BASIC METABOLIC PANEL - Abnormal; Notable for the following components:      Result Value   Sodium 134 (*)    Potassium 3.1 (*)    Glucose, Bld 122 (*)    Calcium 10.4 (*)    All other components within normal limits  URINALYSIS, COMPLETE (UACMP) WITH MICROSCOPIC - Abnormal; Notable for the following components:   Color, Urine YELLOW (*)    APPearance CLEAR (*)    Hgb urine dipstick SMALL (*)    Bacteria, UA RARE (*)    All other components within normal limits  HEPATIC FUNCTION PANEL  LIPASE, BLOOD  LACTIC ACID, PLASMA  CBC WITH DIFFERENTIAL/PLATELET  TROPONIN I (HIGH SENSITIVITY)   ____________________________________________  EKG  ED ECG REPORT I, Arta Silence, the attending physician, personally viewed and interpreted this ECG.  Date:  11/04/2020 EKG Time: 0606 Rate: 56 Rhythm: normal sinus rhythm QRS Axis: normal Intervals: normal ST/T Wave abnormalities: Nonspecific T wave abnormality Narrative Interpretation: Nonspecific abnormalities with no evidence of acute ischemia  ____________________________________________  RADIOLOGY  CT abdomen/pelvis: Pending  ____________________________________________   PROCEDURES  Procedure(s) performed: No  Procedures  Critical Care performed: No ____________________________________________   INITIAL IMPRESSION / ASSESSMENT AND PLAN /  ED COURSE  Pertinent labs & imaging results that were available during my care of the patient were reviewed by me and considered in my medical decision making (see chart for details).  87 year old female with PMH as noted above including diabetes, CAD, carotid stenosis, peripheral artery disease, chronic kidney disease presents with left lower quadrant abdominal pain since yesterday associated with nausea but no vomiting or diarrhea.  I reviewed the past medical records in [place] and [place].  The patient was seen for this symptom yesterday noted clinic.  CT obtained yesterday afternoon shows no acute abnormalities.  There is diverticulosis without evidence of acute diverticulitis.  There are some hypodense lesions noted in the spleen.  On exam today, the vital signs are normal except for hypertension.  The patient is uncomfortable appearing and has some tenderness in the left lower quadrant.  Exam is otherwise unremarkable.  Given the overall negative CT yesterday, the etiology of the pain is unclear.  Most likely cause would be diverticulitis that was not clearly visualized on imaging, versus colitis, gastroenteritis, UTI, pyelonephritis, or abdominal wall pain.  I do not suspect SBO, volvulus, or other acute surgical emergency.  We will obtain repeat labs and based on the results and the patient's clinical status consider repeat  imaging.  ----------------------------------------- 7:00 AM on 11/04/2020 -----------------------------------------  Lab work-up is reassuring and the pain has improved after analgesia.  Given that the pain is still unexplained and based on shared decision making with the patient and family member we will obtain repeat CT.  I have signed the patient out to the oncoming ED physician Dr. Kerman Passey.  ____________________________________________   FINAL CLINICAL IMPRESSION(S) / ED DIAGNOSES  Final diagnoses:  Left lower quadrant abdominal pain      NEW MEDICATIONS STARTED DURING THIS VISIT:  New Prescriptions   No medications on file     Note:  This document was prepared using Dragon voice recognition software and may include unintentional dictation errors.    Arta Silence, MD 11/04/20 0700

## 2020-11-04 NOTE — ED Triage Notes (Signed)
Pt presents to ER c/o LLQ abd pain since yesterday mornign.  Pt states she was seen at [facility] yesterday and had bloodwork, urine and CT scan done.  Pt A&O x4 at this time.  Pt denies any urinary symptoms.

## 2020-11-04 NOTE — ED Provider Notes (Signed)
-----------------------------------------   9:37 AM on 11/04/2020 -----------------------------------------  Patient is pain-free.  Work-up is reassuring.  CT scan is negative.  We will place the patient on Colace twice daily she has been taking tramadol with minimal relief.  We will prescribe a very short course of Norco for the patient.  I discussed return precautions with the patient and her son for any return of/worsening pain or development of fever.  Given the patient's reassuring work-up suspect possible intestinal type pains.   Harvest Dark, MD 11/04/20 [phone]

## 2020-11-06 ENCOUNTER — Emergency Department: Payer: Medicare Other

## 2020-11-06 ENCOUNTER — Emergency Department
Admission: EM | Admit: 2020-11-06 | Discharge: 2020-11-07 | Disposition: A | Discharge status: Home / Self Care | Payer: Medicare Other | Attending: Emergency Medicine | Admitting: Emergency Medicine

## 2020-11-06 ENCOUNTER — Other Ambulatory Visit (HOSPITAL_COMMUNITY): Payer: Self-pay | Admitting: Family Medicine

## 2020-11-06 ENCOUNTER — Other Ambulatory Visit: Payer: Self-pay | Admitting: Family Medicine

## 2020-11-06 ENCOUNTER — Other Ambulatory Visit: Payer: Self-pay

## 2020-11-06 DIAGNOSIS — I2510 Atherosclerotic heart disease of native coronary artery without angina pectoris: Secondary | ICD-10-CM | POA: Insufficient documentation

## 2020-11-06 DIAGNOSIS — I129 Hypertensive chronic kidney disease with stage 1 through stage 4 chronic kidney disease, or unspecified chronic kidney disease: Secondary | ICD-10-CM | POA: Insufficient documentation

## 2020-11-06 DIAGNOSIS — E1122 Type 2 diabetes mellitus with diabetic chronic kidney disease: Secondary | ICD-10-CM | POA: Diagnosis not present

## 2020-11-06 DIAGNOSIS — N183 Chronic kidney disease, stage 3 unspecified: Secondary | ICD-10-CM | POA: Insufficient documentation

## 2020-11-06 DIAGNOSIS — R1032 Left lower quadrant pain: Secondary | ICD-10-CM | POA: Diagnosis not present

## 2020-11-06 DIAGNOSIS — Z7982 Long term (current) use of aspirin: Secondary | ICD-10-CM | POA: Insufficient documentation

## 2020-11-06 DIAGNOSIS — Z79899 Other long term (current) drug therapy: Secondary | ICD-10-CM | POA: Diagnosis not present

## 2020-11-06 DIAGNOSIS — D7389 Other diseases of spleen: Secondary | ICD-10-CM

## 2020-11-06 DIAGNOSIS — E1140 Type 2 diabetes mellitus with diabetic neuropathy, unspecified: Secondary | ICD-10-CM | POA: Insufficient documentation

## 2020-11-06 DIAGNOSIS — R112 Nausea with vomiting, unspecified: Secondary | ICD-10-CM | POA: Insufficient documentation

## 2020-11-06 LAB — CBC
HCT: 39.6 % (ref 36.0–46.0)
Hemoglobin: 12.9 g/dL (ref 12.0–15.0)
MCH: 31 pg (ref 26.0–34.0)
MCHC: 32.6 g/dL (ref 30.0–36.0)
MCV: 95.2 fL (ref 80.0–100.0)
Platelets: 240 10*3/uL (ref 150–400)
RBC: 4.16 MIL/uL (ref 3.87–5.11)
RDW: 12.1 % (ref 11.5–15.5)
WBC: 8.8 10*3/uL (ref 4.0–10.5)
nRBC: 0 % (ref 0.0–0.2)

## 2020-11-06 LAB — COMPREHENSIVE METABOLIC PANEL
ALT: 14 U/L (ref 0–44)
AST: 28 U/L (ref 15–41)
Albumin: 4.3 g/dL (ref 3.5–5.0)
Alkaline Phosphatase: 60 U/L (ref 38–126)
Anion gap: 13 (ref 5–15)
BUN: 13 mg/dL (ref 8–23)
CO2: 24 mmol/L (ref 22–32)
Calcium: 10.4 mg/dL — ABNORMAL HIGH (ref 8.9–10.3)
Chloride: 94 mmol/L — ABNORMAL LOW (ref 98–111)
Creatinine, Ser: 0.96 mg/dL (ref 0.44–1.00)
GFR, Estimated: 58 mL/min — ABNORMAL LOW (ref >60)
Glucose, Bld: 139 mg/dL — ABNORMAL HIGH (ref 70–99)
Potassium: 3.6 mmol/L (ref 3.5–5.1)
Sodium: 131 mmol/L — ABNORMAL LOW (ref 135–145)
Total Bilirubin: 1.3 mg/dL — ABNORMAL HIGH (ref 0.3–1.2)
Total Protein: 7.4 g/dL (ref 6.5–8.1)

## 2020-11-06 MED ORDER — ONDANSETRON HCL 4 MG/2ML IJ SOLN
4.0000 mg | Freq: Once | INTRAMUSCULAR | Status: AC
  Administered 2020-11-06: 4 mg via INTRAVENOUS
  Filled 2020-11-06: qty 2

## 2020-11-06 MED ORDER — IOHEXOL 350 MG/ML SOLN
100.0000 mL | Freq: Once | INTRAVENOUS | Status: AC | PRN
  Administered 2020-11-06: 100 mL via INTRAVENOUS

## 2020-11-06 MED ORDER — FENTANYL CITRATE (PF) 100 MCG/2ML IJ SOLN
50.0000 ug | Freq: Once | INTRAMUSCULAR | Status: AC
  Administered 2020-11-06: 50 ug via INTRAVENOUS
  Filled 2020-11-06: qty 2

## 2020-11-06 NOTE — ED Triage Notes (Signed)
Pt in with co LLQ pain states started 2 days ago. States radiates to left lower back, has vomited x 2 today. Denies any dysuria or diarrhea. Pt was seen here recently for the same but all tests were wnl.

## 2020-11-06 NOTE — ED Notes (Signed)
Soap suds enema adminstered. Pt attempting BM at this time.

## 2020-11-06 NOTE — ED Notes (Signed)
Patient transported to CT 

## 2020-11-06 NOTE — ED Provider Notes (Signed)
[facility] Emergency Department Provider Note  Time seen: 8:27 PM  I have reviewed the triage vital signs and the nursing notes.   HISTORY  Chief Complaint Abdominal Pain   HPI Dawn Mcintyre is a 87 y.o. female with a past medical history of arthritis, gastric reflux, hypertension, hyperlipidemia, presents to the emergency department for continued left lower quadrant abdominal pain.  According to the patient this is now the fourth day of severe 10/10 sharp pain in her left lower quadrant.  Patient has been seen here now 3 times in 4 days for the same pain.  Patient has had 2 - CT scans as well as 2 - work-ups including lab work.  Patient has been taking pain medication at home but states he is not doing anything for the pain.  States he has not sleep at all last night due to the pain.   Patient states some nausea and she vomited this morning.  No diarrhea.  States she has been constipated for the past 3 to 4 days.  Past Medical History:  Diagnosis Date  . Arteritis ([place])   . Arthritis   . Atrophic vaginitis 11/16/2014  . Carotid artery stenosis   . Cataract   . Difficult intubation   . Environmental and seasonal allergies   . GERD (gastroesophageal reflux disease)   . Hyperlipidemia   . Hypertension   . Lack of bladder control   . Neuropathy   . Osteoporosis   . Pneumonia     x 48yrs ago  . Pre-diabetes   . Ulcer     Patient Active Problem List   Diagnosis Date Noted  . Greater trochanteric bursitis of right hip 02/02/2019  . Diabetic neuropathy ([place]) 12/17/2018  . PAD (peripheral artery disease) ([place]) 10/05/2018  . Urinary incontinence in female 09/08/2018  . Carotid stenosis, bilateral 12/24/2017  . Syncope and collapse 11/15/2017  . CAD (coronary artery disease) 11/15/2017  . Leg pain 10/26/2017  . Bilateral carotid artery disease ([place]) 07/11/2016  . Primary osteoarthritis involving multiple joints 07/11/2016  . Pure hypercholesterolemia  07/11/2016  . Carotid stenosis 07/01/2016  . Hyperlipidemia 07/01/2016  . Peripheral sensory neuropathy ([place]) 03/25/2016  . Type 2 diabetes mellitus with stage 3 chronic kidney disease, without long-term current use of insulin ([place]) 03/25/2016  . Anxiety 11/24/2015  . Sensory peripheral neuropathy ([place]) 10/30/2015  . Influenza 10/15/2015  . CKD (chronic kidney disease) stage 3, GFR 30-59 ml/min ([facility]) 07/19/2015  . Osteopenia 07/19/2015  . Chronic insomnia 01/25/2015  . Diet-controlled type 2 diabetes mellitus ([place]) 01/25/2015  . Gastroesophageal reflux disease without esophagitis 01/25/2015  . Mixed stress and urge urinary incontinence 01/25/2015  . Atrophic vaginitis 11/16/2014  . Vitamin B 12 deficiency 10/26/2014  . Abdominal pain, chronic, epigastric 06/15/2014  . Essential hypertension 04/28/2014  . Sciatica of left side 04/07/2014  . HTN (hypertension) 01/28/2014  . Type 2 diabetes mellitus ([place]) 01/28/2014    Past Surgical History:  Procedure Laterality Date  . ABDOMINAL HYSTERECTOMY  1972  . BREAST CYST EXCISION Right 1972  . BREAST CYST EXCISION Left 1972  . ENDARTERECTOMY Left 12/24/2017   Procedure: ENDARTERECTOMY CAROTID;  Surgeon: Katha Cabal, MD;  Location: [facility] ORS;  Service: Vascular;  Laterality: Left;  . ENDARTERECTOMY Right 03/13/2018   Procedure: ENDARTERECTOMY CAROTID;  Surgeon: Katha Cabal, MD;  Location: [facility] ORS;  Service: Vascular;  Laterality: Right;  . EYE SURGERY    . THROAT SURGERY     x  10 yrs ago. throat mass.  . TONSILLECTOMY      Prior to Admission medications   Medication Sig Start Date End Date Taking? Authorizing Provider  acetaminophen (TYLENOL) 500 MG tablet Take 500 mg by mouth daily as needed for headache.    [provider]  ALPRAZolam Duanne Moron) 0.25 MG tablet Take 0.25 mg by mouth at bedtime as needed for sleep.     [provider]  amLODipine (NORVASC) 5 MG tablet TAKE 1.5 TABLETS (7.5 MG TOTAL) BY MOUTH  ONCE DAILY DISCONTINUE LOSARTAN-HCTZ 03/05/18   [provider]  ascorbic acid (VITAMIN C) 1000 MG tablet Take by mouth.    [provider]  aspirin 81 MG tablet Take 81 mg by mouth daily.      [provider]  atorvastatin (LIPITOR) 20 MG tablet Take 20 mg by mouth every evening.  06/25/16 04/08/19  [provider]  Boric Acid CRYS Place 600 mg vaginally at bedtime. Use one capsule vaginally every night for two weeks then twice a week for six months 04/27/19   Anyanwu, Sallyanne Havers, MD  Cholecalciferol (VITAMIN D3) 1.25 MG (50000 UT) CAPS Take by mouth.    [provider]  clotrimazole-betamethasone (LOTRISONE) cream Apply topically. 11/24/19 11/23/20  [provider]  conjugated estrogens (PREMARIN) vaginal cream Apply one pea-sized amount around the opening of the urethra three times weekly. 10/19/20   Vaillancourt, Aldona Bar, PA-C  docusate sodium (COLACE) 100 MG capsule Take 1 capsule (100 mg total) by mouth 2 (two) times daily. 11/04/20 12/04/20  Harvest Dark, MD  felodipine (PLENDIL) 2.5 MG 24 hr tablet Take 2.5 mg by mouth daily. May take a second 2.5 mg dose as needed if bp is over 150/90    [provider]  fexofenadine (ALLEGRA) 180 MG tablet Take 180 mg by mouth daily as needed for allergies.     [provider]  fluconazole (DIFLUCAN) 150 MG tablet Take 1 tablet (150 mg total) by mouth every 3 (three) days. For three doses 08/17/19   Sloan Leiter, MD  fluticasone [facility]) 50 MCG/ACT nasal spray Place 2 sprays into the nose daily as needed for allergies.  02/19/15 04/08/19  [provider]  gabapentin (NEURONTIN) 100 MG capsule Take 100 mg by mouth at bedtime.     [provider]  hydrochlorothiazide (HYDRODIURIL) 12.5 MG tablet Take 1 tablet by mouth daily. 06/15/20   [provider]  HYDROcodone-acetaminophen (NORCO/VICODIN) 5-325 MG tablet Take 1 tablet by mouth every 6 (six) hours as needed for  moderate pain. 11/04/20 11/04/21  Harvest Dark, MD  isosorbide mononitrate (IMDUR) 60 MG 24 hr tablet Take 60 mg by mouth daily.      [provider]  losartan (COZAAR) 100 MG tablet Take 1 tablet by mouth daily. 06/15/20   [provider]  mirabegron ER (MYRBETRIQ) 25 MG TB24 tablet Take by mouth. 11/24/19   [provider]  nitrofurantoin, macrocrystal-monohydrate, (MACROBID) 100 MG capsule Take 100 mg by mouth at bedtime.    [provider]  oxyCODONE-acetaminophen (PERCOCET/ROXICET) 5-325 MG tablet Take 1 tablet by mouth every 8 (eight) hours as needed for severe pain. 04/21/19   Zara Council A, PA-C  pantoprazole (PROTONIX) 40 MG tablet Take 40 mg by mouth daily.  04/28/15   [provider]  predniSONE (DELTASONE) 2.5 MG tablet Take 2.5 mg by mouth daily.     [provider]  senna-docusate (SENOKOT-S) 8.6-50 MG tablet Take 1 tablet by mouth daily. 08/24/19  [provider]  vitamin E 180 MG (400 UNITS) capsule Take by mouth.    [provider]  Zinc 10 MG LOZG Take by mouth.    [provider]    Allergies  Allergen Reactions  . Amoxicillin-Pot Clavulanate Other (See Comments)    Unknown reaction Has patient had a PCN reaction causing immediate rash, facial/tongue/throat swelling, SOB or lightheadedness with hypotension: Unknown Has patient had a PCN reaction causing severe rash involving mucus membranes or skin necrosis: Unknown Has patient had a PCN reaction that required hospitalization: Unknown Has patient had a PCN reaction occurring within the last 10 years: No If all of the above answers are "NO", then may proceed with Cephalosporin use.   . Codeine Hives  . Hydrocodone Nausea Only  . Metronidazole Nausea Only  . Propoxyphene Nausea Only  . Latex Rash    Family History  Problem Relation Age of Onset  . Anuerysm Mother   . Cancer Father        prostrate cancer  . Diabetes Brother   .  Hearing loss Brother   . Bladder Cancer Neg Hx   . Kidney cancer Neg Hx     Social History Social History   Tobacco Use  . Smoking status: Never Smoker  . Smokeless tobacco: Never Used  Vaping Use  . Vaping Use: Never used  Substance Use Topics  . Alcohol use: No    Alcohol/week: 0.0 standard drinks  . Drug use: No    Review of Systems Constitutional: Negative for fever. Cardiovascular: Negative for chest pain. Respiratory: Negative for shortness of breath. Gastrointestinal: Left lower quadrant abdominal pain.  Positive nausea vomiting.  Constipation x3 days. Genitourinary: Negative for urinary compaints Musculoskeletal: Negative for musculoskeletal complaints Neurological: Negative for headache All other ROS negative  ____________________________________________   PHYSICAL EXAM:  VITAL SIGNS: ED Triage Vitals  Enc Vitals Group     BP 11/06/20 1849 (!) 197/70     Pulse Rate 11/06/20 1849 (!) 59     Resp 11/06/20 1849 20     Temp 11/06/20 1849 98.8 F (37.1 C)     Temp Source 11/06/20 1849 Oral     SpO2 11/06/20 1849 98 %     Weight 11/06/20 1850 138 lb (62.6 kg)     Height 11/06/20 1850 5\' 7"  (1.702 m)     Head Circumference --      Peak Flow --      Pain Score 11/06/20 1850 10     Pain Loc --      Pain Edu? --      Excl. in [place]? --     Constitutional: Alert and oriented. Well appearing and in no distress. Eyes: Normal exam ENT      Head: Normocephalic and atraumatic.      Mouth/Throat: Mucous membranes are moist. Cardiovascular: Normal rate, regular rhythm. Respiratory: Normal respiratory effort without tachypnea nor retractions. Breath sounds are clear  Gastrointestinal: Soft, mild left lower quadrant tenderness without rebound guarding or distention.  Abdomen otherwise benign. Musculoskeletal: Nontender with normal range of motion in all extremities. Neurologic:  Normal speech and language. No gross focal neurologic deficits Skin:  Skin is warm, dry  and intact.  Psychiatric: Mood and affect are normal.   ____________________________________________   RADIOLOGY  IMPRESSION:  VASCULAR   1. No acute abnormality.  2. Aortic Atherosclerosis (ICD10-I70.0) -moderate to severe.   NON-VASCULAR   1. Scattered colonic diverticulosis with no definite acute  diverticulitis. Small bowel  diverticulosis also noted. Slightly  limited evaluation due to PO contrast administered.  2. Tiny foci of gas within the urinary bladder lumen. Likely related  to recent instrumentation. Correlate with clinical history. If  clinically indicated, correlate with urinalysis for infection.  3. Otherwise no acute intra-abdominal or intrapelvic abnormality.   ____________________________________________   INITIAL IMPRESSION / ASSESSMENT AND PLAN / ED COURSE  Pertinent labs & imaging results that were available during my care of the patient were reviewed by me and considered in my medical decision making (see chart for details).   Patient presents to the emergency department for continued severe left lower quadrant abdominal pain now x4 days.  Patient has been seen in the emergency department now 3 times for the same pain.  I reviewed the patient's chart she has had 2 - CT scans has had negative lab work negative urinalysis no obvious cause of the patient's pain is yet to be identified.  Patient also saw her primary care doctor who prescribed her an antibiotic per patient as a precaution but still no improvement.  Patient states today she was hurting so bad that she was nauseated and vomited.  States she did not sleep last night due to the pain.  Given the patient's continued and severe pain which she rates as a 10/10 in the left lower quadrant we will proceed with yet another CT scan however this time and angiography scan to help rule out possible mesenteric ischemia given the patient's abdominal pain out of proportion to physical exam age and risk factors.  Lab work  continues to show good kidney function.  We will IV hydrate as well.  Given the patient's constipation with left lower quadrant abdominal pain we will try an enema as well to see if this helps with the patient's discomfort although I have reviewed the CT images myself and she does not appear to have significant constipation on CT 2 days ago.  Patient CTA is negative.  Lab work is reassuring.  Patient had a urinalysis performed 2 days ago that was normal.  Patient states she had a bowel movement after her enema, states she is feeling better.  She will follow up with her doctor tomorrow.  Dawn Mcintyre was evaluated in Emergency Department on 11/06/2020 for the symptoms described in the history of present illness. She was evaluated in the context of the global COVID-19 pandemic, which necessitated consideration that the patient might be at risk for infection with the SARS-CoV-2 virus that causes COVID-19. Institutional protocols and algorithms that pertain to the evaluation of patients at risk for COVID-19 are in a state of rapid change based on information released by regulatory bodies including the CDC and federal and state organizations. These policies and algorithms were followed during the patient's care in the ED.  ____________________________________________   FINAL CLINICAL IMPRESSION(S) / ED DIAGNOSES  Left lower quadrant abdominal pain   Harvest Dark, MD 11/06/20 2335

## 2020-11-07 NOTE — ED Notes (Signed)
Pt agreeable with plan for d/c as discussed by provider- this nurse has verbally reinforced d/c instructions and provided pt with written copy- pt acknowledges verbal understanding and denies any additional questions, concerns, needs.  Ambulatory at discharge independently with steady gait accompanied by cousin -- no acute distress noted.

## 2020-11-09 ENCOUNTER — Emergency Department
Admission: EM | Admit: 2020-11-09 | Discharge: 2020-11-09 | Disposition: A | Discharge status: Home / Self Care | Payer: Medicare Other | Attending: Emergency Medicine | Admitting: Emergency Medicine

## 2020-11-09 ENCOUNTER — Other Ambulatory Visit: Payer: Self-pay

## 2020-11-09 DIAGNOSIS — Z79899 Other long term (current) drug therapy: Secondary | ICD-10-CM | POA: Insufficient documentation

## 2020-11-09 DIAGNOSIS — B029 Zoster without complications: Secondary | ICD-10-CM | POA: Insufficient documentation

## 2020-11-09 DIAGNOSIS — N183 Chronic kidney disease, stage 3 unspecified: Secondary | ICD-10-CM | POA: Insufficient documentation

## 2020-11-09 DIAGNOSIS — Z91040 Latex allergy status: Secondary | ICD-10-CM | POA: Diagnosis not present

## 2020-11-09 DIAGNOSIS — I2510 Atherosclerotic heart disease of native coronary artery without angina pectoris: Secondary | ICD-10-CM | POA: Diagnosis not present

## 2020-11-09 DIAGNOSIS — E1122 Type 2 diabetes mellitus with diabetic chronic kidney disease: Secondary | ICD-10-CM | POA: Insufficient documentation

## 2020-11-09 DIAGNOSIS — E1140 Type 2 diabetes mellitus with diabetic neuropathy, unspecified: Secondary | ICD-10-CM | POA: Insufficient documentation

## 2020-11-09 DIAGNOSIS — I129 Hypertensive chronic kidney disease with stage 1 through stage 4 chronic kidney disease, or unspecified chronic kidney disease: Secondary | ICD-10-CM | POA: Insufficient documentation

## 2020-11-09 DIAGNOSIS — R109 Unspecified abdominal pain: Secondary | ICD-10-CM | POA: Diagnosis present

## 2020-11-09 LAB — URINALYSIS, COMPLETE (UACMP) WITH MICROSCOPIC
Bacteria, UA: NONE SEEN
Bilirubin Urine: NEGATIVE
Glucose, UA: NEGATIVE mg/dL
Hgb urine dipstick: NEGATIVE
Ketones, ur: NEGATIVE mg/dL
Leukocytes,Ua: NEGATIVE
Nitrite: NEGATIVE
Protein, ur: NEGATIVE mg/dL
Specific Gravity, Urine: 1.011 (ref 1.005–1.030)
pH: 7 (ref 5.0–8.0)

## 2020-11-09 MED ORDER — OXYCODONE-ACETAMINOPHEN 5-325 MG PO TABS: 1.0000 | ORAL_TABLET | ORAL | 0 refills | Status: DC | PRN

## 2020-11-09 MED ORDER — ONDANSETRON 4 MG PO TBDP
4.0000 mg | ORAL_TABLET | Freq: Once | ORAL | Status: AC
  Administered 2020-11-09: 4 mg via ORAL
  Filled 2020-11-09: qty 1

## 2020-11-09 MED ORDER — VALACYCLOVIR HCL 1 G PO TABS: 1000.0000 mg | ORAL_TABLET | Freq: Three times a day (TID) | ORAL | 0 refills | Status: DC

## 2020-11-09 MED ORDER — OXYCODONE-ACETAMINOPHEN 5-325 MG PO TABS
1.0000 | ORAL_TABLET | Freq: Once | ORAL | Status: AC
Start: 2020-11-09 — End: 2020-11-09
  Administered 2020-11-09: 1 via ORAL
  Filled 2020-11-09: qty 1

## 2020-11-09 MED ORDER — FENTANYL CITRATE (PF) 100 MCG/2ML IJ SOLN
50.0000 ug | Freq: Once | INTRAMUSCULAR | Status: AC
  Administered 2020-11-09: 50 ug via INTRAMUSCULAR
  Filled 2020-11-09: qty 2

## 2020-11-09 MED ORDER — PREDNISONE 20 MG PO TABS: 40.0000 mg | ORAL_TABLET | Freq: Every day | ORAL | 0 refills | Status: AC

## 2020-11-09 NOTE — ED Provider Notes (Signed)
[facility] Emergency Department Provider Note  Time seen: 1:33 PM  I have reviewed the triage vital signs and the nursing notes.   HISTORY  Chief Complaint Abdominal Pain and Back Pain   HPI Dawn Mcintyre is a 87 y.o. female with a past medical history of arthritis, hypertension, hyperlipidemia, presents to the emergency department for left flank pain.  Patient states yesterday she developed a rash to her left lower back wrapping around to her left lower abdomen.  Patient went to the walk-in clinic today and was told this is likely shingles however referred the patient to the emergency department for pain control given her significant discomfort.  I have seen the patient now 3 times for left lower quadrant abdominal pain, she has been to the ER now for times for the same pain.  She has had 2 CT scans her abdomen/pelvis as well as a CTA scan to rule out mesenteric ischemia.  Now that the patient has a rash which developed yesterday consistent with shingles I highly suspect the patient's pain has been due to her shingles all along.  I did prescribe her an antibiotic as a precaution last time to cover for possible diverticulitis.  That could be discontinued.  No fever.  Patient has tried tramadol and hydrocodone without success at home for pain control.   Past Medical History:  Diagnosis Date  . Arteritis ([place])   . Arthritis   . Atrophic vaginitis 11/16/2014  . Carotid artery stenosis   . Cataract   . Difficult intubation   . Environmental and seasonal allergies   . GERD (gastroesophageal reflux disease)   . Hyperlipidemia   . Hypertension   . Lack of bladder control   . Neuropathy   . Osteoporosis   . Pneumonia     x 50yrs ago  . Pre-diabetes   . Ulcer     Patient Active Problem List   Diagnosis Date Noted  . Greater trochanteric bursitis of right hip 02/02/2019  . Diabetic neuropathy ([place]) 12/17/2018  . PAD (peripheral artery disease) ([place]) 10/05/2018   . Urinary incontinence in female 09/08/2018  . Carotid stenosis, bilateral 12/24/2017  . Syncope and collapse 11/15/2017  . CAD (coronary artery disease) 11/15/2017  . Leg pain 10/26/2017  . Bilateral carotid artery disease ([place]) 07/11/2016  . Primary osteoarthritis involving multiple joints 07/11/2016  . Pure hypercholesterolemia 07/11/2016  . Carotid stenosis 07/01/2016  . Hyperlipidemia 07/01/2016  . Peripheral sensory neuropathy ([place]) 03/25/2016  . Type 2 diabetes mellitus with stage 3 chronic kidney disease, without long-term current use of insulin ([place]) 03/25/2016  . Anxiety 11/24/2015  . Sensory peripheral neuropathy ([place]) 10/30/2015  . Influenza 10/15/2015  . CKD (chronic kidney disease) stage 3, GFR 30-59 ml/min ([facility]) 07/19/2015  . Osteopenia 07/19/2015  . Chronic insomnia 01/25/2015  . Diet-controlled type 2 diabetes mellitus ([place]) 01/25/2015  . Gastroesophageal reflux disease without esophagitis 01/25/2015  . Mixed stress and urge urinary incontinence 01/25/2015  . Atrophic vaginitis 11/16/2014  . Vitamin B 12 deficiency 10/26/2014  . Abdominal pain, chronic, epigastric 06/15/2014  . Essential hypertension 04/28/2014  . Sciatica of left side 04/07/2014  . HTN (hypertension) 01/28/2014  . Type 2 diabetes mellitus ([place]) 01/28/2014    Past Surgical History:  Procedure Laterality Date  . ABDOMINAL HYSTERECTOMY  1972  . BREAST CYST EXCISION Right 1972  . BREAST CYST EXCISION Left 1972  . ENDARTERECTOMY Left 12/24/2017   Procedure: ENDARTERECTOMY CAROTID;  Surgeon: Katha Cabal, MD;  Location: [facility] ORS;  Service: Vascular;  Laterality: Left;  . ENDARTERECTOMY Right 03/13/2018   Procedure: ENDARTERECTOMY CAROTID;  Surgeon: Katha Cabal, MD;  Location: [facility] ORS;  Service: Vascular;  Laterality: Right;  . EYE SURGERY    . THROAT SURGERY     x 10 yrs ago. throat mass.  . TONSILLECTOMY      Prior to Admission medications   Medication Sig Start Date End Date  Taking? Authorizing Provider  acetaminophen (TYLENOL) 500 MG tablet Take 500 mg by mouth daily as needed for headache.    [provider]  ALPRAZolam Duanne Moron) 0.25 MG tablet Take 0.25 mg by mouth at bedtime as needed for sleep.     [provider]  amLODipine (NORVASC) 5 MG tablet TAKE 1.5 TABLETS (7.5 MG TOTAL) BY MOUTH ONCE DAILY DISCONTINUE LOSARTAN-HCTZ 03/05/18   [provider]  ascorbic acid (VITAMIN C) 1000 MG tablet Take by mouth.    [provider]  aspirin 81 MG tablet Take 81 mg by mouth daily.      [provider]  atorvastatin (LIPITOR) 20 MG tablet Take 20 mg by mouth every evening.  06/25/16 04/08/19  [provider]  Boric Acid CRYS Place 600 mg vaginally at bedtime. Use one capsule vaginally every night for two weeks then twice a week for six months 04/27/19   Anyanwu, Sallyanne Havers, MD  Cholecalciferol (VITAMIN D3) 1.25 MG (50000 UT) CAPS Take by mouth.    [provider]  clotrimazole-betamethasone (LOTRISONE) cream Apply topically. 11/24/19 11/23/20  [provider]  conjugated estrogens (PREMARIN) vaginal cream Apply one pea-sized amount around the opening of the urethra three times weekly. 10/19/20   Vaillancourt, Aldona Bar, PA-C  docusate sodium (COLACE) 100 MG capsule Take 1 capsule (100 mg total) by mouth 2 (two) times daily. 11/04/20 12/04/20  Harvest Dark, MD  felodipine (PLENDIL) 2.5 MG 24 hr tablet Take 2.5 mg by mouth daily. May take a second 2.5 mg dose as needed if bp is over 150/90    [provider]  fexofenadine (ALLEGRA) 180 MG tablet Take 180 mg by mouth daily as needed for allergies.     [provider]  fluconazole (DIFLUCAN) 150 MG tablet Take 1 tablet (150 mg total) by mouth every 3 (three) days. For three doses 08/17/19   Sloan Leiter, MD  fluticasone [facility]) 50 MCG/ACT nasal spray Place 2 sprays into the nose daily as needed for allergies.  02/19/15 04/08/19  [provider]  gabapentin (NEURONTIN) 100 MG capsule Take 100 mg by mouth at bedtime.     [provider]  hydrochlorothiazide (HYDRODIURIL) 12.5 MG tablet Take 1 tablet by mouth daily. 06/15/20   [provider]  HYDROcodone-acetaminophen (NORCO/VICODIN) 5-325 MG tablet Take 1 tablet by mouth every 6 (six) hours as needed for moderate pain. 11/04/20 11/04/21  Harvest Dark, MD  isosorbide mononitrate (IMDUR) 60 MG 24 hr tablet Take 60 mg by mouth daily.      [provider]  losartan (COZAAR) 100 MG tablet Take 1 tablet by mouth daily. 06/15/20   [provider]  mirabegron ER (MYRBETRIQ) 25 MG TB24 tablet Take by mouth. 11/24/19   [provider]  nitrofurantoin, macrocrystal-monohydrate, (MACROBID) 100 MG capsule Take 100 mg by mouth at bedtime.    [provider]  oxyCODONE-acetaminophen (PERCOCET/ROXICET) 5-325 MG tablet Take 1 tablet by mouth every 8 (eight) hours as needed for severe pain. 04/21/19   Zara Council A, PA-C  pantoprazole (  PROTONIX) 40 MG tablet Take 40 mg by mouth daily.  04/28/15   [provider]  predniSONE (DELTASONE) 2.5 MG tablet Take 2.5 mg by mouth daily.     [provider]  senna-docusate (SENOKOT-S) 8.6-50 MG tablet Take 1 tablet by mouth daily. 08/24/19   [provider]  vitamin E 180 MG (400 UNITS) capsule Take by mouth.    [provider]  Zinc 10 MG LOZG Take by mouth.    [provider]    Allergies  Allergen Reactions  . Amoxicillin-Pot Clavulanate Other (See Comments)    Unknown reaction Has patient had a PCN reaction causing immediate rash, facial/tongue/throat swelling, SOB or lightheadedness with hypotension: Unknown Has patient had a PCN reaction causing severe rash involving mucus membranes or skin necrosis: Unknown Has patient had a PCN reaction that required hospitalization: Unknown Has patient had a PCN reaction occurring within the last 10 years: No If all  of the above answers are "NO", then may proceed with Cephalosporin use.   . Codeine Hives  . Hydrocodone Nausea Only  . Metronidazole Nausea Only  . Propoxyphene Nausea Only  . Latex Rash    Family History  Problem Relation Age of Onset  . Anuerysm Mother   . Cancer Father        prostrate cancer  . Diabetes Brother   . Hearing loss Brother   . Bladder Cancer Neg Hx   . Kidney cancer Neg Hx     Social History Social History   Tobacco Use  . Smoking status: Never Smoker  . Smokeless tobacco: Never Used  Vaping Use  . Vaping Use: Never used  Substance Use Topics  . Alcohol use: No    Alcohol/week: 0.0 standard drinks  . Drug use: No    Review of Systems Constitutional: Negative for fever. Cardiovascular: Negative for chest pain. Respiratory: Negative for shortness of breath. Gastrointestinal: Left flank pain.  Negative for nausea vomiting or diarrhea. Genitourinary: Negative for urinary compaints Musculoskeletal: Negative for musculoskeletal  Neurological: Negative for headache All other ROS negative  ____________________________________________   PHYSICAL EXAM:  VITAL SIGNS: ED Triage Vitals [11/09/20 1251]  Enc Vitals Group     BP (!) 198/76     Pulse Rate 64     Resp 18     Temp 97.7 F (36.5 C)     Temp Source Oral     SpO2 99 %     Weight 135 lb (61.2 kg)     Height 5\' 7"  (1.702 m)     Head Circumference      Peak Flow      Pain Score 10     Pain Loc      Pain Edu?      Excl. in [place]?     Constitutional: Alert and oriented. Well appearing and in no distress. Eyes: Normal exam ENT      Head: Normocephalic and atraumatic.      Mouth/Throat: Mucous membranes are moist. Cardiovascular: Normal rate, regular rhythm. Respiratory: Normal respiratory effort without tachypnea nor retractions. Breath sounds are clear  Gastrointestinal: Soft and nontender. No distention. Musculoskeletal: Nontender with normal range of motion in all  extremities. Neurologic:  Normal speech and language. No gross focal neurologic deficits  Skin:  Skin is warm.  Patient has a vesicular rash to her left lower back wrapping around to the left abdomen does not cross midline, is erythematous and somewhat tender to palpation.  Consistent with shingles. Psychiatric: Mood  and affect are normal.   ____________________________________________    EKG  EKG viewed and interpreted by myself shows a normal sinus rhythm at 63 bpm with a narrow QRS, normal axis, normal intervals, nonspecific ST changes.  ____________________________________________   INITIAL IMPRESSION / ASSESSMENT AND PLAN / ED COURSE  Pertinent labs & imaging results that were available during my care of the patient were reviewed by me and considered in my medical decision making (see chart for details).   Patient presents emergency department for continued pain which is now in her left back wrapping around to her left lower quadrant.  Patient now has a rash consistent with shingles that appeared yesterday per patient.  Highly suspect the patient's pain all along has been due to shingles but the rash was not present until yesterday per patient.  I believe we can discontinue the patient's antibiotic that was prescribed as a precaution.  We will place the patient on valacyclovir, prednisone as well as oxycodone for pain control.  Patient agreeable to plan of care.  Dawn Mcintyre was evaluated in Emergency Department on 11/09/2020 for the symptoms described in the history of present illness. She was evaluated in the context of the global COVID-19 pandemic, which necessitated consideration that the patient might be at risk for infection with the SARS-CoV-2 virus that causes COVID-19. Institutional protocols and algorithms that pertain to the evaluation of patients at risk for COVID-19 are in a state of rapid change based on information released by regulatory bodies including the CDC and federal  and state organizations. These policies and algorithms were followed during the patient's care in the ED.  ____________________________________________   FINAL CLINICAL IMPRESSION(S) / ED DIAGNOSES  Shingles   Harvest Dark, MD 11/09/20 1459

## 2020-11-09 NOTE — ED Triage Notes (Signed)
Pt was here 2 days ago and was diagnosed with diverticulitis. Pt is on abx but did not take that or her bp meds this morning. Pt was diagnosed with shingles today at the walk in clinic.  Pt says they sent her for pain management. Pt states she is her for pain from shingles and pain in abdomen. Pt denies N/V/D and denies blood in stool. Pt with raised red rash on left lower back and side.

## 2020-11-16 ENCOUNTER — Ambulatory Visit: Payer: Medicare Other

## 2020-11-18 ENCOUNTER — Other Ambulatory Visit: Payer: Self-pay | Admitting: Obstetrics and Gynecology

## 2020-11-18 DIAGNOSIS — B379 Candidiasis, unspecified: Secondary | ICD-10-CM

## 2020-11-18 DIAGNOSIS — T3695XA Adverse effect of unspecified systemic antibiotic, initial encounter: Secondary | ICD-10-CM

## 2020-11-26 ENCOUNTER — Other Ambulatory Visit: Payer: Self-pay

## 2020-11-26 ENCOUNTER — Emergency Department: Payer: Medicare Other

## 2020-11-26 ENCOUNTER — Encounter: Payer: Self-pay | Admitting: Intensive Care

## 2020-11-26 ENCOUNTER — Observation Stay
Admission: EM | Admit: 2020-11-26 | Discharge: 2020-11-28 | Disposition: A | Discharge status: Home / Self Care | Payer: Medicare Other | Attending: Internal Medicine | Admitting: Internal Medicine

## 2020-11-26 DIAGNOSIS — N183 Chronic kidney disease, stage 3 unspecified: Secondary | ICD-10-CM | POA: Insufficient documentation

## 2020-11-26 DIAGNOSIS — I129 Hypertensive chronic kidney disease with stage 1 through stage 4 chronic kidney disease, or unspecified chronic kidney disease: Secondary | ICD-10-CM | POA: Diagnosis not present

## 2020-11-26 DIAGNOSIS — R109 Unspecified abdominal pain: Secondary | ICD-10-CM | POA: Diagnosis present

## 2020-11-26 DIAGNOSIS — K921 Melena: Principal | ICD-10-CM

## 2020-11-26 DIAGNOSIS — Z91040 Latex allergy status: Secondary | ICD-10-CM | POA: Insufficient documentation

## 2020-11-26 DIAGNOSIS — R6510 Systemic inflammatory response syndrome (SIRS) of non-infectious origin without acute organ dysfunction: Secondary | ICD-10-CM | POA: Diagnosis present

## 2020-11-26 DIAGNOSIS — Z20822 Contact with and (suspected) exposure to covid-19: Secondary | ICD-10-CM | POA: Diagnosis not present

## 2020-11-26 DIAGNOSIS — K2289 Other specified disease of esophagus: Secondary | ICD-10-CM | POA: Diagnosis not present

## 2020-11-26 DIAGNOSIS — I2510 Atherosclerotic heart disease of native coronary artery without angina pectoris: Secondary | ICD-10-CM | POA: Diagnosis not present

## 2020-11-26 DIAGNOSIS — Z79899 Other long term (current) drug therapy: Secondary | ICD-10-CM | POA: Insufficient documentation

## 2020-11-26 DIAGNOSIS — E1122 Type 2 diabetes mellitus with diabetic chronic kidney disease: Secondary | ICD-10-CM | POA: Diagnosis not present

## 2020-11-26 DIAGNOSIS — Z7982 Long term (current) use of aspirin: Secondary | ICD-10-CM | POA: Diagnosis not present

## 2020-11-26 DIAGNOSIS — Z7984 Long term (current) use of oral hypoglycemic drugs: Secondary | ICD-10-CM | POA: Insufficient documentation

## 2020-11-26 DIAGNOSIS — I25118 Atherosclerotic heart disease of native coronary artery with other forms of angina pectoris: Secondary | ICD-10-CM

## 2020-11-26 DIAGNOSIS — B0229 Other postherpetic nervous system involvement: Secondary | ICD-10-CM

## 2020-11-26 DIAGNOSIS — R1032 Left lower quadrant pain: Secondary | ICD-10-CM

## 2020-11-26 DIAGNOSIS — A09 Infectious gastroenteritis and colitis, unspecified: Secondary | ICD-10-CM | POA: Diagnosis present

## 2020-11-26 HISTORY — DX: Zoster without complications: B02.9

## 2020-11-26 LAB — TROPONIN I (HIGH SENSITIVITY)
Troponin I (High Sensitivity): 18 ng/L — ABNORMAL HIGH (ref <18)
Troponin I (High Sensitivity): 25 ng/L — ABNORMAL HIGH (ref <18)

## 2020-11-26 LAB — CBC WITH DIFFERENTIAL/PLATELET
Abs Immature Granulocytes: 0.02 10*3/uL (ref 0.00–0.07)
Basophils Absolute: 0 10*3/uL (ref 0.0–0.1)
Basophils Relative: 0 %
Eosinophils Absolute: 0.1 10*3/uL (ref 0.0–0.5)
Eosinophils Relative: 1 %
HCT: 35.4 % — ABNORMAL LOW (ref 36.0–46.0)
Hemoglobin: 11.5 g/dL — ABNORMAL LOW (ref 12.0–15.0)
Immature Granulocytes: 0 %
Lymphocytes Relative: 26 %
Lymphs Abs: 1.7 10*3/uL (ref 0.7–4.0)
MCH: 31.2 pg (ref 26.0–34.0)
MCHC: 32.5 g/dL (ref 30.0–36.0)
MCV: 95.9 fL (ref 80.0–100.0)
Monocytes Absolute: 0.6 10*3/uL (ref 0.1–1.0)
Monocytes Relative: 9 %
Neutro Abs: 4.1 10*3/uL (ref 1.7–7.7)
Neutrophils Relative %: 64 %
Platelets: 178 10*3/uL (ref 150–400)
RBC: 3.69 MIL/uL — ABNORMAL LOW (ref 3.87–5.11)
RDW: 13 % (ref 11.5–15.5)
WBC: 6.5 10*3/uL (ref 4.0–10.5)
nRBC: 0 % (ref 0.0–0.2)

## 2020-11-26 LAB — COMPREHENSIVE METABOLIC PANEL
ALT: 9 U/L (ref 0–44)
AST: 18 U/L (ref 15–41)
Albumin: 3.5 g/dL (ref 3.5–5.0)
Alkaline Phosphatase: 51 U/L (ref 38–126)
Anion gap: 9 (ref 5–15)
BUN: 13 mg/dL (ref 8–23)
CO2: 29 mmol/L (ref 22–32)
Calcium: 10 mg/dL (ref 8.9–10.3)
Chloride: 101 mmol/L (ref 98–111)
Creatinine, Ser: 0.93 mg/dL (ref 0.44–1.00)
GFR, Estimated: >60 mL/min (ref >60)
Glucose, Bld: 122 mg/dL — ABNORMAL HIGH (ref 70–99)
Potassium: 3.1 mmol/L — ABNORMAL LOW (ref 3.5–5.1)
Sodium: 139 mmol/L (ref 135–145)
Total Bilirubin: 0.6 mg/dL (ref 0.3–1.2)
Total Protein: 6 g/dL — ABNORMAL LOW (ref 6.5–8.1)

## 2020-11-26 LAB — RESP PANEL BY RT-PCR (FLU A&B, COVID) ARPGX2
Influenza A by PCR: NEGATIVE
Influenza B by PCR: NEGATIVE
SARS Coronavirus 2 by RT PCR: NEGATIVE

## 2020-11-26 MED ORDER — MORPHINE SULFATE (PF) 2 MG/ML IV SOLN
2.0000 mg | INTRAVENOUS | Status: DC | PRN
  Administered 2020-11-26 – 2020-11-28 (×4): 2 mg via INTRAVENOUS
  Filled 2020-11-26 (×4): qty 1

## 2020-11-26 MED ORDER — VALACYCLOVIR HCL 500 MG PO TABS
1000.0000 mg | ORAL_TABLET | Freq: Three times a day (TID) | ORAL | Status: DC
  Administered 2020-11-27 – 2020-11-28 (×5): 1000 mg via ORAL
  Filled 2020-11-26 (×6): qty 2

## 2020-11-26 MED ORDER — DOCUSATE SODIUM 100 MG PO CAPS
100.0000 mg | ORAL_CAPSULE | Freq: Two times a day (BID) | ORAL | Status: DC
  Administered 2020-11-27 – 2020-11-28 (×3): 100 mg via ORAL
  Filled 2020-11-26 (×3): qty 1

## 2020-11-26 MED ORDER — IOHEXOL 300 MG/ML  SOLN
100.0000 mL | Freq: Once | INTRAMUSCULAR | Status: AC | PRN
  Administered 2020-11-26: 100 mL via INTRAVENOUS

## 2020-11-26 MED ORDER — MIRABEGRON ER 25 MG PO TB24
25.0000 mg | ORAL_TABLET | Freq: Every day | ORAL | Status: DC
  Administered 2020-11-27 – 2020-11-28 (×2): 25 mg via ORAL
  Filled 2020-11-26 (×2): qty 1

## 2020-11-26 MED ORDER — FLUTICASONE PROPIONATE 50 MCG/ACT NA SUSP
2.0000 | Freq: Every day | NASAL | Status: DC | PRN
  Filled 2020-11-26: qty 16

## 2020-11-26 MED ORDER — HYDROCHLOROTHIAZIDE 25 MG PO TABS
12.5000 mg | ORAL_TABLET | Freq: Every day | ORAL | Status: DC
  Administered 2020-11-27: 12.5 mg via ORAL
  Filled 2020-11-26: qty 1

## 2020-11-26 MED ORDER — TRAMADOL HCL 50 MG PO TABS
50.0000 mg | ORAL_TABLET | Freq: Four times a day (QID) | ORAL | Status: DC | PRN
  Administered 2020-11-26 – 2020-11-28 (×2): 50 mg via ORAL
  Filled 2020-11-26 (×2): qty 1

## 2020-11-26 MED ORDER — ALPRAZOLAM 0.5 MG PO TABS
0.2500 mg | ORAL_TABLET | Freq: Every evening | ORAL | Status: DC | PRN
  Administered 2020-11-27: 0.25 mg via ORAL
  Filled 2020-11-26: qty 1

## 2020-11-26 MED ORDER — HYDRALAZINE HCL 20 MG/ML IJ SOLN
5.0000 mg | INTRAMUSCULAR | Status: DC | PRN
  Administered 2020-11-26 – 2020-11-27 (×2): 5 mg via INTRAVENOUS
  Filled 2020-11-26: qty 1

## 2020-11-26 MED ORDER — GABAPENTIN 100 MG PO CAPS
200.0000 mg | ORAL_CAPSULE | Freq: Once | ORAL | Status: AC
  Administered 2020-11-26: 200 mg via ORAL
  Filled 2020-11-26: qty 2

## 2020-11-26 MED ORDER — LORATADINE 10 MG PO TABS
10.0000 mg | ORAL_TABLET | Freq: Every day | ORAL | Status: DC
  Administered 2020-11-27 – 2020-11-28 (×2): 10 mg via ORAL
  Filled 2020-11-26 (×2): qty 1

## 2020-11-26 MED ORDER — PANTOPRAZOLE SODIUM 40 MG IV SOLR
40.0000 mg | Freq: Once | INTRAVENOUS | Status: AC
  Administered 2020-11-26: 40 mg via INTRAVENOUS
  Filled 2020-11-26: qty 40

## 2020-11-26 MED ORDER — LOSARTAN POTASSIUM 50 MG PO TABS
100.0000 mg | ORAL_TABLET | Freq: Every day | ORAL | Status: DC
  Administered 2020-11-27: 100 mg via ORAL
  Filled 2020-11-26: qty 2

## 2020-11-26 MED ORDER — OXYCODONE-ACETAMINOPHEN 5-325 MG PO TABS: 1.0000 | ORAL_TABLET | ORAL | Status: DC | PRN

## 2020-11-26 MED ORDER — MORPHINE SULFATE (PF) 2 MG/ML IV SOLN
2.0000 mg | Freq: Once | INTRAVENOUS | Status: AC
  Administered 2020-11-26: 2 mg via INTRAVENOUS
  Filled 2020-11-26: qty 1

## 2020-11-26 MED ORDER — AMLODIPINE BESYLATE 5 MG PO TABS
5.0000 mg | ORAL_TABLET | Freq: Every day | ORAL | Status: DC
  Administered 2020-11-27: 5 mg via ORAL
  Filled 2020-11-26: qty 1

## 2020-11-26 MED ORDER — SODIUM CHLORIDE 0.9 % IV SOLN: INTRAVENOUS | Status: DC

## 2020-11-26 MED ORDER — GABAPENTIN 100 MG PO CAPS
200.0000 mg | ORAL_CAPSULE | Freq: Three times a day (TID) | ORAL | Status: DC
  Administered 2020-11-27 (×2): 200 mg via ORAL
  Filled 2020-11-26 (×3): qty 2

## 2020-11-26 MED ORDER — POTASSIUM CHLORIDE CRYS ER 20 MEQ PO TBCR
40.0000 meq | EXTENDED_RELEASE_TABLET | Freq: Once | ORAL | Status: AC
  Administered 2020-11-26: 40 meq via ORAL

## 2020-11-26 MED ORDER — MORPHINE SULFATE (PF) 2 MG/ML IV SOLN: 1.0000 mg | INTRAVENOUS | Status: DC | PRN

## 2020-11-26 MED ORDER — PANTOPRAZOLE SODIUM 40 MG IV SOLR
40.0000 mg | Freq: Every day | INTRAVENOUS | Status: DC
  Administered 2020-11-27: 40 mg via INTRAVENOUS
  Filled 2020-11-26: qty 40

## 2020-11-26 MED ORDER — ISOSORBIDE MONONITRATE ER 30 MG PO TB24
60.0000 mg | ORAL_TABLET | Freq: Every day | ORAL | Status: DC
  Administered 2020-11-27: 60 mg via ORAL
  Filled 2020-11-26: qty 2

## 2020-11-26 MED ORDER — SENNOSIDES-DOCUSATE SODIUM 8.6-50 MG PO TABS
1.0000 | ORAL_TABLET | Freq: Every day | ORAL | Status: DC
  Administered 2020-11-27 – 2020-11-28 (×2): 1 via ORAL
  Filled 2020-11-26 (×2): qty 1

## 2020-11-26 NOTE — H&P (Addendum)
History and Physical    Dawn Mcintyre PFX:[id number] DOB: [birth year] DOA: 11/26/2020  PCP: Idelle Crouch, MD  Patient coming from: home, lives alone, fully independent  I have personally briefly reviewed patient's old medical records in [place]  Chief Complaint: abdominal pain, dark stools  HPI: Dawn Mcintyre is a 87 y.o. female with medical history significant of HTN, HLD, carotid stenosis s/p b/l CEA's, PAD, CAD, GERD, borderline type 2 diabetes, neuropathy, CKD 3, anxiety, insomnia who presented to the ED from home today with left lower quadrant abdominal pain and dark stools for 4-5 days.  Describes the pain as sharp.  She reported taking Pepto bismol but only once.  Recently has had Shingles, taking Valtrex, having a lot of pain that has kept her from sleeping.  Says gabapentin does not help it much.  She has had fecal urgency with incontinence which is unusual for her.  Denies feeling dizzy lightheaded or short of breath, but reports some mild chest discomfort across lower rib cage, says this feels like bloating.  She says her PCP had referred her to GI for this and her appointment is this comingTuesday.  She denies fever but is cold-natured and has occasional chills, nausea/vomiting, diarrhea (stools loose), no dysuria, but has baseline urinary frequency.  No focal weakness, numbness or tingling, speech or swallow trouble.     ED Course: Hbg 11.5 down from recent 12.9.  Hypertensive due to missing BP meds.  Potassium 3.1 but otherwise normal chemistries.  CT abdomen/pelvis negative but showed diverticulosis without evidence of diverticulitis.   Admitted for observation for possible GI bleeding, monitor for worsening anemia or recurrent bleeding.     Review of Systems: As per HPI otherwise 10 point review of systems negative.    Past Medical History:  Diagnosis Date  . Arteritis ([place])   . Arthritis   . Atrophic vaginitis 11/16/2014  . Carotid artery stenosis   .  Cataract   . Difficult intubation   . Environmental and seasonal allergies   . GERD (gastroesophageal reflux disease)   . Hyperlipidemia   . Hypertension   . Lack of bladder control   . Neuropathy   . Osteoporosis   . Pneumonia     x 59yrs ago  . Pre-diabetes   . Shingles   . Ulcer     Past Surgical History:  Procedure Laterality Date  . ABDOMINAL HYSTERECTOMY  1972  . BREAST CYST EXCISION Right 1972  . BREAST CYST EXCISION Left 1972  . ENDARTERECTOMY Left 12/24/2017   Procedure: ENDARTERECTOMY CAROTID;  Surgeon: Katha Cabal, MD;  Location: [facility] ORS;  Service: Vascular;  Laterality: Left;  . ENDARTERECTOMY Right 03/13/2018   Procedure: ENDARTERECTOMY CAROTID;  Surgeon: Katha Cabal, MD;  Location: [facility] ORS;  Service: Vascular;  Laterality: Right;  . EYE SURGERY    . THROAT SURGERY     x 10 yrs ago. throat mass.  . TONSILLECTOMY       reports that she has never smoked. She has never used smokeless tobacco. She reports that she does not drink alcohol and does not use drugs.  Allergies  Allergen Reactions  . Amoxicillin-Pot Clavulanate Other (See Comments)    Unknown reaction Has patient had a PCN reaction causing immediate rash, facial/tongue/throat swelling, SOB or lightheadedness with hypotension: Unknown Has patient had a PCN reaction causing severe rash involving mucus membranes or skin necrosis: Unknown Has patient had a PCN reaction that required hospitalization: Unknown Has patient had  a PCN reaction occurring within the last 10 years: No If all of the above answers are "NO", then may proceed with Cephalosporin use.   . Codeine Hives  . Hydrocodone Nausea Only  . Metronidazole Nausea Only  . Propoxyphene Nausea Only  . Latex Rash    Family History  Problem Relation Age of Onset  . Anuerysm Mother   . Cancer Father        prostrate cancer  . Diabetes Brother   . Hearing loss Brother   . Bladder Cancer Neg Hx   . Kidney cancer Neg Hx        Prior to Admission medications   Medication Sig Start Date End Date Taking? Authorizing Provider  acetaminophen (TYLENOL) 500 MG tablet Take 500 mg by mouth daily as needed for headache.    [provider]  ALPRAZolam Duanne Moron) 0.25 MG tablet Take 0.25 mg by mouth at bedtime as needed for sleep.     [provider]  amLODipine (NORVASC) 5 MG tablet TAKE 1.5 TABLETS (7.5 MG TOTAL) BY MOUTH ONCE DAILY DISCONTINUE LOSARTAN-HCTZ 03/05/18   [provider]  ascorbic acid (VITAMIN C) 1000 MG tablet Take by mouth.    [provider]  aspirin 81 MG tablet Take 81 mg by mouth daily.      [provider]  atorvastatin (LIPITOR) 20 MG tablet Take 20 mg by mouth every evening.  06/25/16 04/08/19  [provider]  Boric Acid CRYS Place 600 mg vaginally at bedtime. Use one capsule vaginally every night for two weeks then twice a week for six months 04/27/19   Anyanwu, Sallyanne Havers, MD  Cholecalciferol (VITAMIN D3) 1.25 MG (50000 UT) CAPS Take by mouth.    [provider]  conjugated estrogens (PREMARIN) vaginal cream Apply one pea-sized amount around the opening of the urethra three times weekly. 10/19/20   Vaillancourt, Aldona Bar, PA-C  docusate sodium (COLACE) 100 MG capsule Take 1 capsule (100 mg total) by mouth 2 (two) times daily. 11/04/20 12/04/20  Harvest Dark, MD  felodipine (PLENDIL) 2.5 MG 24 hr tablet Take 2.5 mg by mouth daily. May take a second 2.5 mg dose as needed if bp is over 150/90    [provider]  fexofenadine (ALLEGRA) 180 MG tablet Take 180 mg by mouth daily as needed for allergies.     [provider]  fluconazole (DIFLUCAN) 150 MG tablet TAKE 1 TABLET BY MOUTH EVERY 3 DAYS FOR 3 DOSES 11/18/20   [name], MD  fluticasone [facility]) 50 MCG/ACT nasal spray Place 2 sprays into the nose daily as needed for allergies.  02/19/15 04/08/19  [provider]  gabapentin (NEURONTIN) 100 MG capsule Take 100 mg  by mouth at bedtime.     [provider]  hydrochlorothiazide (HYDRODIURIL) 12.5 MG tablet Take 1 tablet by mouth daily. 06/15/20   [provider]  HYDROcodone-acetaminophen (NORCO/VICODIN) 5-325 MG tablet Take 1 tablet by mouth every 6 (six) hours as needed for moderate pain. 11/04/20 11/04/21  Harvest Dark, MD  isosorbide mononitrate (IMDUR) 60 MG 24 hr tablet Take 60 mg by mouth daily.      [provider]  losartan (COZAAR) 100 MG tablet Take 1 tablet by mouth daily. 06/15/20   [provider]  mirabegron ER (MYRBETRIQ) 25 MG TB24 tablet Take by mouth. 11/24/19   [provider]  nitrofurantoin, macrocrystal-monohydrate, (MACROBID) 100 MG capsule Take 100 mg by mouth at bedtime.    [provider]  oxyCODONE-acetaminophen (PERCOCET) 5-325 MG tablet Take 1 tablet by mouth every 4 (four) hours as needed for severe pain. 11/09/20   Harvest Dark, MD  pantoprazole (PROTONIX) 40 MG tablet Take 40 mg by mouth daily.  04/28/15   [provider]  senna-docusate (SENOKOT-S) 8.6-50 MG tablet Take 1 tablet by mouth daily. 08/24/19   [provider]  valACYclovir (VALTREX) 1000 MG tablet Take 1 tablet (1,000 mg total) by mouth 3 (three) times daily. 11/09/20   Harvest Dark, MD  vitamin E 180 MG (400 UNITS) capsule Take by mouth.    [provider]  Zinc 10 MG LOZG Take by mouth.    [provider]    Physical Exam: Vitals:   11/26/20 1356 11/26/20 1357 11/26/20 1515 11/26/20 1631  BP: (!) 157/73  (!) 198/89 (!) 211/81  Pulse: (!) 59  (!) 56 65  Resp: 16  13 18   Temp: 99 F (37.2 C)     TempSrc: Oral     SpO2: 97%  97% 98%  Weight:  60.8 kg    Height:  5\' 7"  (1.702 m)       Constitutional: NAD, calm, comfortable Eyes: PERRL, lids and conjunctivae normal ENMT: Mucous membranes are moist. Posterior pharynx clear of any exudate or lesions.Normal dentition.  Neck: normal, supple, no masses, no  thyromegaly Respiratory: CTAB, no wheezing, no crackles. Normal respiratory effort. No accessory muscle use.  Cardiovascular: RRR, no murmurs / rubs / gallops. No extremity edema. 2+ pedal pulses. No carotid bruits.  Abdomen: soft, NT, ND, no masses or HSM palpated. +Bowel sounds.  Musculoskeletal: no clubbing / cyanosis. No joint deformity upper and lower extremities. Normal muscle tone.  Skin: left lower abdomen and side with patchy erythema no fluid-filled vesicles were seen.  Skin dry, intact, normal color, normal temperature Neurologic: CN 2-12 grossly intact. Normal speech.  Grossly non-focal exam. Psychiatric: Alert and oriented x 3. Normal mood. Congruent affect.  Normal judgement and insight.   Labs on Admission: I have personally reviewed following labs and imaging studies  CBC: Recent Labs  Lab 11/26/20 1417  WBC 6.5  NEUTROABS 4.1  HGB 11.5*  HCT 35.4*  MCV 95.9  PLT 045   Basic Metabolic Panel: Recent Labs  Lab 11/26/20 1417  NA 139  K 3.1*  CL 101  CO2 29  GLUCOSE 122*  BUN 13  CREATININE 0.93  CALCIUM 10.0   GFR: Estimated Creatinine Clearance: 42.4 mL/min (by C-G formula based on SCr of 0.93 mg/dL). Liver Function Tests: Recent Labs  Lab 11/26/20 1417  AST 18  ALT 9  ALKPHOS 51  BILITOT 0.6  PROT 6.0*  ALBUMIN 3.5   No results for input(s): LIPASE, AMYLASE in the last 168 hours. No results for input(s): AMMONIA in the last 168 hours. Coagulation Profile: No results for input(s): INR, PROTIME in the last 168 hours. Cardiac Enzymes: No results for input(s): CKTOTAL, CKMB, CKMBINDEX, TROPONINI in the last 168 hours. BNP (last 3 results) No results for input(s): PROBNP in the last 8760 hours. HbA1C: No results for input(s): HGBA1C in the last 72 hours. CBG: No results for input(s): GLUCAP in the last 168 hours. Lipid Profile: No results for input(s): CHOL, HDL, LDLCALC, TRIG, CHOLHDL, LDLDIRECT in the last 72 hours. Thyroid Function  Tests: No results for input(s): TSH, T4TOTAL, FREET4, T3FREE, THYROIDAB in the last 72 hours. Anemia Panel: No results for input(s): VITAMINB12, FOLATE, FERRITIN, TIBC, IRON, RETICCTPCT in the last 72 hours. Urine analysis:  Component Value Date/Time   COLORURINE YELLOW (A) 11/09/2020 1241   APPEARANCEUR CLEAR (A) 11/09/2020 1241   APPEARANCEUR Cloudy (A) 05/18/2019 1404   LABSPEC 1.011 11/09/2020 1241   LABSPEC 1.026 09/12/2011 2304   PHURINE 7.0 11/09/2020 1241   GLUCOSEU NEGATIVE 11/09/2020 1241   GLUCOSEU Negative 09/12/2011 2304   HGBUR NEGATIVE 11/09/2020 1241   BILIRUBINUR NEGATIVE 11/09/2020 1241   BILIRUBINUR Negative 05/18/2019 1404   BILIRUBINUR Negative 09/12/2011 2304   KETONESUR NEGATIVE 11/09/2020 1241   PROTEINUR NEGATIVE 11/09/2020 1241   UROBILINOGEN 0.2 05/17/2016 0904   NITRITE NEGATIVE 11/09/2020 1241   LEUKOCYTESUR NEGATIVE 11/09/2020 1241   LEUKOCYTESUR 2+ 09/12/2011 2304    Radiological Exams on Admission: CT ABDOMEN PELVIS W CONTRAST  Result Date: 11/26/2020 CLINICAL DATA:  Left lower quadrant abdominal pain, melena and diarrhea. EXAM: CT ABDOMEN AND PELVIS WITH CONTRAST TECHNIQUE: Multidetector CT imaging of the abdomen and pelvis was performed using the standard protocol following bolus administration of intravenous contrast. CONTRAST:  173mL OMNIPAQUE IOHEXOL 300 MG/ML  SOLN COMPARISON:  November 06, 2020 and November 04, 2020. FINDINGS: Lower chest: Hypoventilatory change in the dependent lungs. Cardiac enlargement. No significant pericardial effusion/thickening. Hepatobiliary: Again seen is a small hypervascular focus in the central liver on image 12/2 which is unchanged dating back to 08/02/2013 and considered benign likely a intrahepatic shunt versus hemangioma. No additional hepatic lesions. Gallbladder is grossly unremarkable. No biliary ductal dilation. Pancreas: Similar mild diffuse pancreatic ductal dilation measuring 3-4 mm in diameter, this is chronic  and not substantially changed dating back to 2014. No discrete pancreatic masses or evidence of acute inflammation. Spleen: Normal in size without focal abnormality. Adrenals/Urinary Tract: Bilateral adrenal glands are unremarkable. No hydronephrosis. No solid enhancing renal lesions. Urinary bladder is grossly unremarkable for degree of distension. Stomach/Bowel: Small hiatal hernia otherwise unremarkable appearance of the none distended stomach. No suspicious small bowel wall thickening. Scattered small bowel diverticula without evidence of acute diverticulosis. The appendix is not discretely visualized however there is no pericecal inflammation. Left-sided colonic diverticulosis without findings of acute diverticulitis. Vascular/Lymphatic: Aortic atherosclerosis. No abdominal aortic aneurysm. No enlarged abdominal or pelvic lymph nodes. Reproductive: Status post hysterectomy. No adnexal masses. Other: No pneumoperitoneum.  No abdominopelvic ascites. Musculoskeletal: Multilevel degenerative change of the spine. No acute osseous abnormality. IMPRESSION: 1. No acute no acute abnormality in the abdomen or pelvis. Specifically no evidence of bowel obstruction or acute bowel inflammation. 2. Left-sided colonic diverticulosis and a few small bowel diverticula without evidence of acute diverticulitis. 3. Small hiatal hernia. 4. Aortic atherosclerosis. Aortic Atherosclerosis (ICD10-I70.0). Electronically Signed   By: Dahlia Bailiff MD   On: 11/26/2020 16:01    EKG: Independently reviewed. Normal sinus no acute ischemic changes.  Assessment/Plan Active Problems:   Abdominal pain   Left lower quadrant abdominal pain / Melena - slight drop in Hbg, could be due to use of Pepto but only used it once.   --Monitor Hbg --Monitor for further melena / bloody stools --IV Protonix for now, hold home PPI --GI consult vs keep Tuesday appt already scheduled depending on clinical course --clear liquid diet, NPO after  midnight --IV fluids  Hypertensive urgency with Hx of HTN - do to missed home meds, pt says this is typical.  Her PCP has her take a PRN blood pressure medication at home when it's too high. --Continue home meds pending med history --Hydralazine PRN for now  Hypokalemia - K 3.1 on admission, replaced. --Monitor BMP  Shingles - involving continue  Valtrex and monitor.  CAD / PAD / Carotid stenosis - hold ASA Chest discomfort appears GI related, has been ongoing issue and PCP referred to GI (appt this Tues). EKG and troponin normal. --repeat Trop for trend  Anxiety / Insomnia - continue home Xanax  Borderline type 2 diabetes - not on medications.  Will start sliding scale if indicated.  BMP in AM for fasting glucose.     DVT prophylaxis: SCDs Start: 11/26/20 1706   Code Status: Full Family Communication: Spoke with son Tim on speaker phone during encounter with patient Disposition Plan: expect d/c home in 1-2 days  Consults called: none  Admission status:  Status is: Observation  Observation status appropriate as likely pt to be discharged before two midngihts.  Dispo: The patient is from: home              Anticipated d/c is to: home              Patient currently not medically stable for d/c   Difficult to place patient no         Ezekiel Slocumb, DO [facility]  11/26/2020, 5:47 PM    If 7PM-7AM, please contact night-coverage. How to contact the [facility] Attending or Consulting provider [place] or covering provider during after hours [place], for this patient?    1. Check the care team in [facility] and look for a) attending/consulting [facility] provider listed and b) the [facility] team listed 2. Log into [email or web address] and use [place]'s universal password to access. If you do not have the password, please contact the hospital operator. 3. Locate the [facility] provider you are looking for under [facility] and page to a number that you can be directly reached. 4. If you  still have difficulty reaching the provider, please page the [facility] (Director on Call) for the Hospitalists listed on amion for assistance.

## 2020-11-26 NOTE — Progress Notes (Addendum)
Pt. Received @ 2000 from ED. Pt. escorted in wheel chair with Nursing assistant. Pt. BP continue to monitor and tx as needed per medication parameter. Continue to monitor. Pt. Respiratory rate was elevated due to anxiety. Pt. Current calm and resting. Respiratory rate is now WNL.  Pt. On room air. No sign of respiratory distress.

## 2020-11-26 NOTE — ED Triage Notes (Signed)
Patient c/o Left side pain that started a  Few days ago. Reports recently getting over shingles that are present in same spot as pain but crusted over.

## 2020-11-26 NOTE — ED Provider Notes (Signed)
4:32 PM Assumed care for off going team.   Blood pressure (!) 198/89, pulse (!) 56, temperature 99 F (37.2 C), temperature source Oral, resp. rate 13, height 5\' 7"  (1.702 m), weight 60.8 kg, SpO2 97 %.  See their HPI for full report but in brief CT/labs pending--3 weeks ago started on valtrex/shingles. But now LLQ pain and melena for 3 days recent peptobismal. Aspirin only.    CT scan consistent with diverticulosis without evidence of diverticulitis.  Hemoglobin did drop from 12.9-11.5.  Reevaluated patient.  Patient still having pain in her left lower quadrant.  Suspect this is related to her herpes zoster.  Given her CT scan is otherwise negative.  Patient's hemoglobin did drop and on my rectal exam she had no stool to test but I was able to get a little bit of liquid that was Hemoccult positive therefore I discussed with patient that it is hard to tell if the stool is dark from the Pepto-Bismol versus potential GI bleed but given her age and she lives alone can be safest thing would be to admit her to the hospital for repeat hemoglobin levels.  Patient expressed understanding felt comfortable with this plan.  We will give patient dose of Protonix and discussed hospital team for admission         Vanessa [place], MD 11/26/20 [phone]

## 2020-11-26 NOTE — ED Provider Notes (Signed)
[facility] Emergency Department Provider Note ____________________________________________   Event Date/Time   First MD Initiated Contact with Patient 11/26/20 1356     (approximate)  I have reviewed the triage vital signs and the nursing notes.  HISTORY  Chief Complaint Herpes Zoster and Flank Pain   HPI Dawn Mcintyre is a 87 y.o. femalewho presents to the ED for evaluation of flank pain.   Chart review indicates patient was diagnosed with shingles on 4/7 and prescribed valacyclovir, prednisone and as needed opiates. Patient is on ASA 81 daily as her only thinner.  Patient reports some persistent itchiness to her left flank, but her shingles is resolving.  She reports 3-4 days of melena and LLQ abdominal pain.  She was seen by her PCP on 4/21 and was started on ciprofloxacin for the possibility of diverticulitis.  Outpatient labs were sent that day and her hemoglobin was 12.0.    Past Medical History:  Diagnosis Date  . Arteritis ([place])   . Arthritis   . Atrophic vaginitis 11/16/2014  . Carotid artery stenosis   . Cataract   . Difficult intubation   . Environmental and seasonal allergies   . GERD (gastroesophageal reflux disease)   . Hyperlipidemia   . Hypertension   . Lack of bladder control   . Neuropathy   . Osteoporosis   . Pneumonia     x 81yrs ago  . Pre-diabetes   . Shingles   . Ulcer     Patient Active Problem List   Diagnosis Date Noted  . Greater trochanteric bursitis of right hip 02/02/2019  . Diabetic neuropathy ([place]) 12/17/2018  . PAD (peripheral artery disease) ([place]) 10/05/2018  . Urinary incontinence in female 09/08/2018  . Carotid stenosis, bilateral 12/24/2017  . Syncope and collapse 11/15/2017  . CAD (coronary artery disease) 11/15/2017  . Leg pain 10/26/2017  . Bilateral carotid artery disease ([place]) 07/11/2016  . Primary osteoarthritis involving multiple joints 07/11/2016  . Pure hypercholesterolemia 07/11/2016   . Carotid stenosis 07/01/2016  . Hyperlipidemia 07/01/2016  . Peripheral sensory neuropathy ([place]) 03/25/2016  . Type 2 diabetes mellitus with stage 3 chronic kidney disease, without long-term current use of insulin ([place]) 03/25/2016  . Anxiety 11/24/2015  . Sensory peripheral neuropathy ([place]) 10/30/2015  . Influenza 10/15/2015  . CKD (chronic kidney disease) stage 3, GFR 30-59 ml/min ([facility]) 07/19/2015  . Osteopenia 07/19/2015  . Chronic insomnia 01/25/2015  . Diet-controlled type 2 diabetes mellitus ([place]) 01/25/2015  . Gastroesophageal reflux disease without esophagitis 01/25/2015  . Mixed stress and urge urinary incontinence 01/25/2015  . Atrophic vaginitis 11/16/2014  . Vitamin B 12 deficiency 10/26/2014  . Abdominal pain, chronic, epigastric 06/15/2014  . Essential hypertension 04/28/2014  . Sciatica of left side 04/07/2014  . HTN (hypertension) 01/28/2014  . Type 2 diabetes mellitus ([place]) 01/28/2014    Past Surgical History:  Procedure Laterality Date  . ABDOMINAL HYSTERECTOMY  1972  . BREAST CYST EXCISION Right 1972  . BREAST CYST EXCISION Left 1972  . ENDARTERECTOMY Left 12/24/2017   Procedure: ENDARTERECTOMY CAROTID;  Surgeon: Katha Cabal, MD;  Location: [facility] ORS;  Service: Vascular;  Laterality: Left;  . ENDARTERECTOMY Right 03/13/2018   Procedure: ENDARTERECTOMY CAROTID;  Surgeon: Katha Cabal, MD;  Location: [facility] ORS;  Service: Vascular;  Laterality: Right;  . EYE SURGERY    . THROAT SURGERY     x 10 yrs ago. throat mass.  . TONSILLECTOMY      Prior to Admission medications  Medication Sig Start Date End Date Taking? Authorizing Provider  acetaminophen (TYLENOL) 500 MG tablet Take 500 mg by mouth daily as needed for headache.    [provider]  ALPRAZolam Duanne Moron) 0.25 MG tablet Take 0.25 mg by mouth at bedtime as needed for sleep.     [provider]  amLODipine (NORVASC) 5 MG tablet TAKE 1.5 TABLETS (7.5 MG TOTAL) BY MOUTH ONCE DAILY  DISCONTINUE LOSARTAN-HCTZ 03/05/18   [provider]  ascorbic acid (VITAMIN C) 1000 MG tablet Take by mouth.    [provider]  aspirin 81 MG tablet Take 81 mg by mouth daily.      [provider]  atorvastatin (LIPITOR) 20 MG tablet Take 20 mg by mouth every evening.  06/25/16 04/08/19  [provider]  Boric Acid CRYS Place 600 mg vaginally at bedtime. Use one capsule vaginally every night for two weeks then twice a week for six months 04/27/19   Anyanwu, Sallyanne Havers, MD  Cholecalciferol (VITAMIN D3) 1.25 MG (50000 UT) CAPS Take by mouth.    [provider]  conjugated estrogens (PREMARIN) vaginal cream Apply one pea-sized amount around the opening of the urethra three times weekly. 10/19/20   Vaillancourt, Aldona Bar, PA-C  docusate sodium (COLACE) 100 MG capsule Take 1 capsule (100 mg total) by mouth 2 (two) times daily. 11/04/20 12/04/20  Harvest Dark, MD  felodipine (PLENDIL) 2.5 MG 24 hr tablet Take 2.5 mg by mouth daily. May take a second 2.5 mg dose as needed if bp is over 150/90    [provider]  fexofenadine (ALLEGRA) 180 MG tablet Take 180 mg by mouth daily as needed for allergies.     [provider]  fluconazole (DIFLUCAN) 150 MG tablet TAKE 1 TABLET BY MOUTH EVERY 3 DAYS FOR 3 DOSES 11/18/20   [name], MD  fluticasone [facility]) 50 MCG/ACT nasal spray Place 2 sprays into the nose daily as needed for allergies.  02/19/15 04/08/19  [provider]  gabapentin (NEURONTIN) 100 MG capsule Take 100 mg by mouth at bedtime.     [provider]  hydrochlorothiazide (HYDRODIURIL) 12.5 MG tablet Take 1 tablet by mouth daily. 06/15/20   [provider]  HYDROcodone-acetaminophen (NORCO/VICODIN) 5-325 MG tablet Take 1 tablet by mouth every 6 (six) hours as needed for moderate pain. 11/04/20 11/04/21  Harvest Dark, MD  isosorbide mononitrate (IMDUR) 60 MG 24 hr tablet Take 60 mg by mouth daily.      [provider]  losartan (COZAAR) 100 MG tablet Take 1 tablet by mouth daily. 06/15/20   [provider]  mirabegron ER (MYRBETRIQ) 25 MG TB24 tablet Take by mouth. 11/24/19   [provider]  nitrofurantoin, macrocrystal-monohydrate, (MACROBID) 100 MG capsule Take 100 mg by mouth at bedtime.    [provider]  oxyCODONE-acetaminophen (PERCOCET) 5-325 MG tablet Take 1 tablet by mouth every 4 (four) hours as needed for severe pain. 11/09/20   Harvest Dark, MD  pantoprazole (PROTONIX) 40 MG tablet Take 40 mg by mouth daily.  04/28/15   [provider]  senna-docusate (SENOKOT-S) 8.6-50 MG tablet Take 1 tablet by mouth daily. 08/24/19   [provider]  valACYclovir (VALTREX) 1000 MG tablet Take 1 tablet (1,000 mg total) by mouth 3 (three) times daily. 11/09/20   Harvest Dark, MD  vitamin E 180 MG (400 UNITS) capsule Take by mouth.    [provider]  Zinc 10 MG LOZG Take by mouth.  [provider]    Allergies Amoxicillin-pot clavulanate, Codeine, Hydrocodone, Metronidazole, Propoxyphene, and Latex  Family History  Problem Relation Age of Onset  . Anuerysm Mother   . Cancer Father        prostrate cancer  . Diabetes Brother   . Hearing loss Brother   . Bladder Cancer Neg Hx   . Kidney cancer Neg Hx     Social History Social History   Tobacco Use  . Smoking status: Never Smoker  . Smokeless tobacco: Never Used  Vaping Use  . Vaping Use: Never used  Substance Use Topics  . Alcohol use: No    Alcohol/week: 0.0 standard drinks  . Drug use: No    Review of Systems  Constitutional: No fever/chills Eyes: No visual changes. ENT: No sore throat. Cardiovascular: Denies chest pain. Respiratory: Denies shortness of breath. Gastrointestinal: Positive for LLQ abdominal pain and melena. No nausea, no vomiting.  No diarrhea.  No constipation. Genitourinary: Negative for dysuria. Musculoskeletal: Negative for back  pain. Skin: Negative for rash. Neurological: Negative for headaches, focal weakness or numbness.  ____________________________________________   PHYSICAL EXAM:  VITAL SIGNS: Vitals:   11/26/20 1356  BP: (!) 157/73  Pulse: (!) 59  Resp: 16  Temp: 99 F (37.2 C)  SpO2: 97%     Constitutional: Alert and oriented. Well appearing and in no acute distress. Eyes: Conjunctivae are normal. PERRL. EOMI. Head: Atraumatic. Nose: No congestion/rhinnorhea. Mouth/Throat: Mucous membranes are moist.  Oropharynx non-erythematous. Neck: No stridor. No cervical spine tenderness to palpation. Cardiovascular: Normal rate, regular rhythm. Grossly normal heart sounds.  Good peripheral circulation. Respiratory: Normal respiratory effort.  No retractions. Lungs CTAB. Gastrointestinal: Soft , nondistended. No CVA tenderness. Isolated LLQ abdominal tenderness without peritoneal features.  Abdomen is otherwise benign. Musculoskeletal: No lower extremity tenderness nor edema.  No joint effusions. No signs of acute trauma. Neurologic:  Normal speech and language. No gross focal neurologic deficits are appreciated. No gait instability noted. Skin:  Skin is warm, dry and intact.  Resolving shingles rash that is crusting over to her left flank.  No hyperalgesic skin to this area. Psychiatric: Mood and affect are normal. Speech and behavior are normal.  ____________________________________________   LABS (all labs ordered are listed, but only abnormal results are displayed)  Labs Reviewed  CBC WITH DIFFERENTIAL/PLATELET - Abnormal; Notable for the following components:      Result Value   RBC 3.69 (*)    Hemoglobin 11.5 (*)    HCT 35.4 (*)    All other components within normal limits  COMPREHENSIVE METABOLIC PANEL - Abnormal; Notable for the following components:   Potassium 3.1 (*)    Glucose, Bld 122 (*)    Total Protein 6.0 (*)    All other components within normal limits    ____________________________________________  12 Lead EKG   ____________________________________________  RADIOLOGY  ED MD interpretation: CT pending  Official radiology report(s): No results found.  ____________________________________________   PROCEDURES and INTERVENTIONS  Procedure(s) performed (including Critical Care):  .1-3 Lead EKG Interpretation Performed by: Vladimir Crofts, MD Authorized by: Vladimir Crofts, MD     Interpretation: normal     ECG rate:  60   ECG rate assessment: normal     Rhythm: sinus rhythm     Ectopy: none     Conduction: normal      Medications  gabapentin (NEURONTIN) capsule 200 mg (200 mg Oral Given 11/26/20 1504)  morphine 2 MG/ML injection 2 mg (2 mg Intravenous Given  11/26/20 1504)    ____________________________________________   MDM / ED COURSE   87 year old woman on aspirin and recently diagnosed with herpes zoster presents to the ED with new LLQ abdominal pain and melena concerning for possible upper GI bleed versus diverticulitis.  Normal vitals.  Exam with LLQ tenderness without peritoneal features.  Resolving herpes zoster rash.  Blood work with slight decrement to her H&H concerning for possible GI bleed.  Metabolic panel pending and CT pending the time of signout to oncoming provider.  Possibly postherpetic neuralgia, but melena and abdominal pain are concerning for intra-abdominal pathology.  Patient signed out to oncoming provider      ____________________________________________   FINAL CLINICAL IMPRESSION(S) / ED DIAGNOSES  Final diagnoses:  LLQ abdominal pain  Melena     ED Discharge Orders    None       Ezell Melikian   Note:  This document was prepared using Dragon voice recognition software and may include unintentional dictation errors.   Vladimir Crofts, MD 11/26/20 [phone]

## 2020-11-26 NOTE — ED Notes (Signed)
Request made for transport to the floor ?

## 2020-11-27 DIAGNOSIS — E876 Hypokalemia: Secondary | ICD-10-CM | POA: Diagnosis not present

## 2020-11-27 DIAGNOSIS — R1010 Upper abdominal pain, unspecified: Secondary | ICD-10-CM | POA: Diagnosis not present

## 2020-11-27 DIAGNOSIS — R1032 Left lower quadrant pain: Secondary | ICD-10-CM | POA: Diagnosis not present

## 2020-11-27 DIAGNOSIS — B0229 Other postherpetic nervous system involvement: Secondary | ICD-10-CM

## 2020-11-27 DIAGNOSIS — K921 Melena: Secondary | ICD-10-CM | POA: Diagnosis not present

## 2020-11-27 LAB — CBC
HCT: 40.3 % (ref 36.0–46.0)
Hemoglobin: 13.5 g/dL (ref 12.0–15.0)
MCH: 31.5 pg (ref 26.0–34.0)
MCHC: 33.5 g/dL (ref 30.0–36.0)
MCV: 94.2 fL (ref 80.0–100.0)
Platelets: 182 10*3/uL (ref 150–400)
RBC: 4.28 MIL/uL (ref 3.87–5.11)
RDW: 13.1 % (ref 11.5–15.5)
WBC: 10.9 10*3/uL — ABNORMAL HIGH (ref 4.0–10.5)
nRBC: 0 % (ref 0.0–0.2)

## 2020-11-27 LAB — MAGNESIUM: Magnesium: 1.3 mg/dL — ABNORMAL LOW (ref 1.7–2.4)

## 2020-11-27 LAB — BASIC METABOLIC PANEL
Anion gap: 12 (ref 5–15)
BUN: 8 mg/dL (ref 8–23)
CO2: 27 mmol/L (ref 22–32)
Calcium: 10.1 mg/dL (ref 8.9–10.3)
Chloride: 97 mmol/L — ABNORMAL LOW (ref 98–111)
Creatinine, Ser: 0.7 mg/dL (ref 0.44–1.00)
GFR, Estimated: >60 mL/min (ref >60)
Glucose, Bld: 133 mg/dL — ABNORMAL HIGH (ref 70–99)
Potassium: 3.2 mmol/L — ABNORMAL LOW (ref 3.5–5.1)
Sodium: 136 mmol/L (ref 135–145)

## 2020-11-27 LAB — TROPONIN I (HIGH SENSITIVITY)
Troponin I (High Sensitivity): 28 ng/L — ABNORMAL HIGH (ref <18)
Troponin I (High Sensitivity): 26 ng/L — ABNORMAL HIGH (ref <18)

## 2020-11-27 MED ORDER — LIDOCAINE 5 % EX PTCH
2.0000 | MEDICATED_PATCH | CUTANEOUS | Status: DC
  Administered 2020-11-27 – 2020-11-28 (×2): 2 via TRANSDERMAL
  Filled 2020-11-27 (×2): qty 2

## 2020-11-27 MED ORDER — ONDANSETRON HCL 4 MG/2ML IJ SOLN
4.0000 mg | Freq: Four times a day (QID) | INTRAMUSCULAR | Status: DC | PRN
  Administered 2020-11-27: 4 mg via INTRAVENOUS
  Filled 2020-11-27: qty 2

## 2020-11-27 MED ORDER — PANTOPRAZOLE SODIUM 40 MG IV SOLR
40.0000 mg | Freq: Two times a day (BID) | INTRAVENOUS | Status: DC
  Administered 2020-11-27 – 2020-11-28 (×2): 40 mg via INTRAVENOUS
  Filled 2020-11-27 (×2): qty 40

## 2020-11-27 MED ORDER — POLYETHYLENE GLYCOL 3350 17 GM/SCOOP PO POWD
1.0000 | Freq: Once | ORAL | Status: DC
  Filled 2020-11-27: qty 255

## 2020-11-27 MED ORDER — POTASSIUM CHLORIDE CRYS ER 20 MEQ PO TBCR
40.0000 meq | EXTENDED_RELEASE_TABLET | Freq: Once | ORAL | Status: AC
  Administered 2020-11-27: 40 meq via ORAL
  Filled 2020-11-27: qty 2

## 2020-11-27 MED ORDER — GABAPENTIN 300 MG PO CAPS
300.0000 mg | ORAL_CAPSULE | Freq: Three times a day (TID) | ORAL | Status: DC
  Administered 2020-11-27 – 2020-11-28 (×4): 300 mg via ORAL
  Filled 2020-11-27 (×4): qty 1

## 2020-11-27 MED ORDER — MAGNESIUM SULFATE 4 GM/100ML IV SOLN
4.0000 g | Freq: Once | INTRAVENOUS | Status: AC
  Administered 2020-11-27: 4 g via INTRAVENOUS
  Filled 2020-11-27: qty 100

## 2020-11-27 NOTE — Care Management Obs Status (Signed)
[facility] NOTIFICATION   Patient Details  Name: Dawn Mcintyre MRN: [id number] Date of Birth: Jun 27, 1933   Medicare Observation Status Notification Given:  Yes    Beverly Sessions, RN 11/27/2020, 3:52 PM

## 2020-11-27 NOTE — Progress Notes (Signed)
Patient's BP 88/51. Dr. Manuella Ghazi notified. No new orders at this time. Will continue to monitor.

## 2020-11-27 NOTE — Plan of Care (Signed)
Pt. Received 1945. Pt. Alert and resting

## 2020-11-27 NOTE — Consult Note (Addendum)
Dawn Darby, MD [address]  [place], [place] [address]  Main: [phone]  Fax: [phone] Pager: [phone]   Consultation  Referring Provider:     No ref. provider found Primary Care Physician:  Idelle Crouch, MD Primary Gastroenterologist: Althia Forts         Reason for Consultation:     Melena, left lower quadrant pain, epigastric pain  Date of Admission:  11/26/2020 Date of Consultation:  11/27/2020         HPI:   Dawn Mcintyre is a 87 y.o. female with history of hypertension, hyperlipidemia, carotid stenosis s/p bilateral carotid endarterectomy, type 2 diabetes, stage III CKD, peripheral artery disease, coronary artery disease.  Patient is admitted with dark stools and left lower quadrant pain for last 4 to 5 days.  She went to see her PCP on 4/21 who referred her to GI as outpatient, was empirically started on ciprofloxacin for possible diverticulitis.  Her hemoglobin was 12.  Actually, her appointment is tomorrow with Dr. Alice Reichert in his office.  However, due to worsening of pain patient presented to ER yesterday.  She was also recently diagnosed and treated for shingles with valacyclovir and prednisone in early April.  She reports that the lesions have crusted and they are resolving.  Her hemoglobin was 11.5 yesterday, 13.5 today with mild leukocytosis.  She does have normal BUN/creatinine.  Her troponins are mildly elevated.  She underwent CT abdomen pelvis with contrast with no evidence of acute intra-abdominal pathology.  GI is consulted for further evaluation Patient is also being treated for hypertensive urgency, however later in the day her blood pressure was borderline low, antihypertensive meds have been held.  However, she denies any chest pain, lightheadedness, shortness of breath.  Patient reports that the left lower quadrant pain brought her to the hospital.  She was also experiencing epigastric pain.  She reports that her abdominal pain is  improving, she did not have a bowel movement in last 2 days.  She took Pepto-Bismol about a week ago and she reports that her stools were checked black.  She does not smoke or drink alcohol.  Lives independently at home.  Her son lives in [place], [place] to visit her on Friday this week  NSAIDs: None  Antiplts/Anticoagulants/Anti thrombotics: None  GI Procedures:  EGD and colonoscopy 10/24/2011 Colon polyps were detected Diagnosis:  Part A: STOMACH BIOPSY:  - FOCAL ACUTE EROSION IN ONE OF 4 PIECES OF OXYNTIC MUCOSA.  - NO ATROPHY OR INTESTINAL METAPLASIA.  - NEGATIVE FOR HELICOBACTER PYLORI ON DIFF-QUIK STAIN.  Marland Kitchen  Part B: DISTAL ESOPHAGUS BIOPSY:  - SQUAMOCOLUMNAR-JUNCTION MUCOSA WITH MILD CHRONIC INFLAMMATION.  - NO INTESTINAL METAPLASIA SEEN IN THIS SAMPLE.  - NEGATIVE FOR DYSPLASIA AND MALIGNANCY.   Past Medical History:  Diagnosis Date  . Arteritis ([place])   . Arthritis   . Atrophic vaginitis 11/16/2014  . Carotid artery stenosis   . Cataract   . Difficult intubation   . Environmental and seasonal allergies   . GERD (gastroesophageal reflux disease)   . Hyperlipidemia   . Hypertension   . Lack of bladder control   . Neuropathy   . Osteoporosis   . Pneumonia     x 73yr ago  . Pre-diabetes   . Shingles   . Ulcer     Past Surgical History:  Procedure Laterality Date  . ABDOMINAL HYSTERECTOMY  1972  . BREAST CYST EXCISION Right 1972  . BREAST  CYST EXCISION Left 1972  . ENDARTERECTOMY Left 12/24/2017   Procedure: ENDARTERECTOMY CAROTID;  Surgeon: Katha Cabal, MD;  Location: [facility] ORS;  Service: Vascular;  Laterality: Left;  . ENDARTERECTOMY Right 03/13/2018   Procedure: ENDARTERECTOMY CAROTID;  Surgeon: Katha Cabal, MD;  Location: [facility] ORS;  Service: Vascular;  Laterality: Right;  . EYE SURGERY    . THROAT SURGERY     x 10 yrs ago. throat mass.  . TONSILLECTOMY      Prior to Admission medications   Medication Sig Start Date End Date Taking?  Authorizing Provider  acetaminophen (TYLENOL) 500 MG tablet Take 500 mg by mouth daily as needed for headache.   Yes [provider]  ALPRAZolam (XANAX) 0.25 MG tablet Take 0.25 mg by mouth at bedtime as needed for sleep.   Yes [provider]  ascorbic acid (VITAMIN C) 1000 MG tablet Take 1,000 mg by mouth daily.   Yes [provider]  aspirin 81 MG tablet Take 81 mg by mouth daily.   Yes [provider]  Cholecalciferol (VITAMIN D3) 1.25 MG (50000 UT) CAPS Take by mouth.   Yes [provider]  conjugated estrogens (PREMARIN) vaginal cream Apply one pea-sized amount around the opening of the urethra three times weekly. 10/19/20  Yes Vaillancourt, Samantha, PA-C  docusate sodium (COLACE) 100 MG capsule Take 1 capsule (100 mg total) by mouth 2 (two) times daily. 11/04/20 12/04/20 Yes Harvest Dark, MD  felodipine (PLENDIL) 2.5 MG 24 hr tablet Take 2.5 mg by mouth daily. May take a second 2.5 mg dose as needed if bp is over 150/90   Yes [provider]  fexofenadine (ALLEGRA) 180 MG tablet Take 180 mg by mouth daily as needed for allergies.    Yes [provider]  gabapentin (NEURONTIN) 100 MG capsule Take 100 mg by mouth at bedtime.   Yes [provider]  hydrochlorothiazide (HYDRODIURIL) 12.5 MG tablet Take 1 tablet by mouth daily. 06/15/20  Yes [provider]  isosorbide mononitrate (IMDUR) 60 MG 24 hr tablet Take 60 mg by mouth daily.   Yes [provider]  losartan (COZAAR) 100 MG tablet Take 1 tablet by mouth daily. 06/15/20  Yes [provider]  metoprolol tartrate (LOPRESSOR) 100 MG tablet Take 100 mg by mouth 2 (two) times daily. 09/27/20  Yes [provider]  mirabegron ER (MYRBETRIQ) 25 MG TB24 tablet Take by mouth. 11/24/19  Yes [provider]  oxyCODONE-acetaminophen (PERCOCET) 5-325 MG tablet Take 1 tablet by mouth every 4 (four) hours as needed for severe pain. 11/09/20  Yes  Harvest Dark, MD  valACYclovir (VALTREX) 1000 MG tablet Take 1 tablet (1,000 mg total) by mouth 3 (three) times daily. 11/09/20  Yes Harvest Dark, MD  vitamin E 180 MG (400 UNITS) capsule Take 400 Units by mouth daily.   Yes [provider]  cloNIDine (CATAPRES) 0.1 MG tablet Take 0.1 mg by mouth at bedtime. 07/24/20   [provider]   Current Facility-Administered Medications:  .  0.9 %  sodium chloride infusion, , Intravenous, Continuous, Ezekiel Slocumb, DO, Stopped at 11/27/20 1142 .  ALPRAZolam Duanne Moron) tablet 0.25 mg, 0.25 mg, Oral, QHS PRN, Nicole Kindred A, DO, 0.25 mg at 11/27/20 0019 .  docusate sodium (COLACE) capsule 100 mg, 100 mg, Oral, BID, Nicole Kindred A, DO, 100 mg at 11/27/20 0936 .  fluticasone (FLONASE) 50 MCG/ACT nasal spray 2 spray, 2 spray, Each Nare, Daily PRN, Nicole Kindred A, DO .  gabapentin (NEURONTIN) capsule 300 mg, 300 mg, Oral, TID, Max Sane, MD, 300 mg at 11/27/20 1522 .  isosorbide mononitrate (IMDUR) 24 hr tablet 60 mg, 60 mg, Oral, Daily, Nicole Kindred A, DO, 60 mg at 11/27/20 0933 .  lidocaine (LIDODERM) 5 % 2 patch, 2 patch, Transdermal, Q24H, Max Sane, MD, 2 patch at 11/27/20 1521 .  loratadine (CLARITIN) tablet 10 mg, 10 mg, Oral, Daily, Nicole Kindred A, DO, 10 mg at 11/27/20 0936 .  mirabegron ER (MYRBETRIQ) tablet 25 mg, 25 mg, Oral, Daily, Nicole Kindred A, DO, 25 mg at 11/27/20 3428 .  morphine 2 MG/ML injection 2 mg, 2 mg, Intravenous, Q4H PRN, Nicole Kindred A, DO, 2 mg at 11/27/20 0443 .  ondansetron (ZOFRAN) injection 4 mg, 4 mg, Intravenous, Q6H PRN, Sharion Settler, NP, 4 mg at 11/27/20 0020 .  oxyCODONE-acetaminophen (PERCOCET/ROXICET) 5-325 MG per tablet 1 tablet, 1 tablet, Oral, Q4H PRN, Nicole Kindred A, DO .  pantoprazole (PROTONIX) injection 40 mg, 40 mg, Intravenous, Q12H, Manuella Ghazi, Vipul, MD .  senna-docusate (Senokot-S) tablet 1 tablet, 1 tablet, Oral, Daily, Nicole Kindred A, DO, 1 tablet at  11/27/20 0936 .  traMADol (ULTRAM) tablet 50 mg, 50 mg, Oral, Q6H PRN, Nicole Kindred A, DO, 50 mg at 11/26/20 2257 .  valACYclovir (VALTREX) tablet 1,000 mg, 1,000 mg, Oral, TID, Nicole Kindred A, DO, 1,000 mg at 11/27/20 1522   Family History  Problem Relation Age of Onset  . Anuerysm Mother   . Cancer Father        prostrate cancer  . Diabetes Brother   . Hearing loss Brother   . Bladder Cancer Neg Hx   . Kidney cancer Neg Hx      Social History   Tobacco Use  . Smoking status: Never Smoker  . Smokeless tobacco: Never Used  Vaping Use  . Vaping Use: Never used  Substance Use Topics  . Alcohol use: No    Alcohol/week: 0.0 standard drinks  . Drug use: No    Allergies as of 11/26/2020 - Review Complete 11/26/2020  Allergen Reaction Noted  . Amoxicillin-pot clavulanate Other (See Comments) 05/06/2015  . Codeine Hives 04/11/2011  . Hydrocodone Nausea Only 05/06/2015  . Metronidazole Nausea Only 05/06/2015  . Propoxyphene Nausea Only 05/06/2015  . Latex Rash 05/06/2015    Review of Systems:    All systems reviewed and negative except where noted in HPI.   Physical Exam:  Vital signs in last 24 hours: Temp:  [98 F (36.7 C)-98.8 F (37.1 C)] 98.3 F (36.8 C) (04/25 1717) Pulse Rate:  [66-84] 79 (04/25 1112) Resp:  [16-30] 16 (04/25 1717) BP: (86-179)/(48-86) 98/48 (04/25 1847) SpO2:  [95 %-100 %] 95 % (04/25 1717)   General:   Pleasant, cooperative in NAD Head:  Normocephalic and atraumatic. Eyes:   No icterus.   Conjunctiva pink. PERRLA. Ears:  Normal auditory acuity. Neck:  Supple; no masses or thyroidomegaly Lungs: Respirations even and unlabored. Lungs clear to auscultation bilaterally.   No wheezes, crackles, or rhonchi.  Heart:  Regular rate and rhythm;  Without murmur, clicks, rubs or gallops Abdomen:  Soft, mildly distended, tympanic to percussion, nontender. Normal bowel sounds. No appreciable masses or hepatomegaly.  No rebound or guarding.   Rectal:  Not performed. Msk:  Symmetrical without gross deformities.  Strength generalized weakness Extremities:  Without edema, cyanosis or clubbing. Neurologic:  Alert and oriented x3;  grossly normal neurologically. Skin: Dried lesions in left flank area posteriorly from recent shingles. Psych:  Alert and cooperative. Normal affect.  LAB RESULTS: CBC Latest Ref Rng & Units 11/27/2020 11/26/2020 11/06/2020  WBC 4.0 - 10.5 K/uL 10.9(H) 6.5 8.8  Hemoglobin 12.0 - 15.0 g/dL 13.5 11.5(L) 12.9  Hematocrit 36.0 - 46.0 % 40.3 35.4(L) 39.6  Platelets 150 - 400 K/uL 182 178 240    BMET BMP Latest Ref Rng & Units 11/27/2020 11/26/2020 11/06/2020  Glucose 70 - 99 mg/dL 133(H) 122(H) 139(H)  BUN 8 - 23 mg/dL _0 Creatinine 0.44 - 1.00 mg/dL 0.70 0.93 0.96  BUN/Creat Ratio 12 - 28 - - -  Sodium 135 - 145 mmol/L 136 139 131(L)  Potassium 3.5 - 5.1 mmol/L 3.2(L) 3.1(L) 3.6  Chloride 98 - 111 mmol/L 97(L) 101 94(L)  CO2 22 - 32 mmol/L _1 Calcium 8.9 - 10.3 mg/dL 10.1 10.0 10.4(H)    LFT Hepatic Function Latest Ref Rng & Units 11/26/2020 11/06/2020 11/04/2020  Total Protein 6.5 - 8.1 g/dL 6.0(L) 7.4 7.1  Albumin 3.5 - 5.0 g/dL 3.5 4.3 4.3  AST 15 - 41 U/L _2 ALT 0 - 44 U/L _3 Alk Phosphatase 38 - 126 U/L 51 60 61  Total Bilirubin 0.3 - 1.2 mg/dL 0.6 1.3(H) 0.9  Bilirubin, Direct 0.0 - 0.2 mg/dL - - 0.1     STUDIES: CT ABDOMEN PELVIS W CONTRAST  Result Date: 11/26/2020 CLINICAL DATA:  Left lower quadrant abdominal pain, melena and diarrhea. EXAM: CT ABDOMEN AND PELVIS WITH CONTRAST TECHNIQUE: Multidetector CT imaging of the abdomen and pelvis was performed using the standard protocol following bolus administration of intravenous contrast. CONTRAST:  118m OMNIPAQUE IOHEXOL 300 MG/ML  SOLN COMPARISON:  November 06, 2020 and November 04, 2020. FINDINGS: Lower chest: Hypoventilatory change in the dependent lungs. Cardiac enlargement. No significant pericardial effusion/thickening.  Hepatobiliary: Again seen is a small hypervascular focus in the central liver on image 12/2 which is unchanged dating back to 08/02/2013 and considered benign likely a intrahepatic shunt versus hemangioma. No additional hepatic lesions. Gallbladder is grossly unremarkable. No biliary ductal dilation. Pancreas: Similar mild diffuse pancreatic ductal dilation measuring 3-4 mm in diameter, this is chronic and not substantially changed dating back to 2014. No discrete pancreatic masses or evidence of acute inflammation. Spleen: Normal in size without focal abnormality. Adrenals/Urinary Tract: Bilateral adrenal glands are unremarkable. No hydronephrosis. No solid enhancing renal lesions. Urinary bladder is grossly unremarkable for degree of distension. Stomach/Bowel: Small hiatal hernia otherwise unremarkable appearance of the none distended stomach. No suspicious small bowel wall thickening. Scattered small bowel diverticula without evidence of acute diverticulosis. The appendix is not discretely visualized however there is no pericecal inflammation. Left-sided colonic diverticulosis without findings of acute diverticulitis. Vascular/Lymphatic: Aortic atherosclerosis. No abdominal aortic aneurysm. No enlarged abdominal or pelvic lymph nodes. Reproductive: Status post hysterectomy. No adnexal masses. Other: No pneumoperitoneum.  No abdominopelvic ascites. Musculoskeletal: Multilevel degenerative change of the spine. No acute osseous abnormality. IMPRESSION: 1. No acute no acute abnormality in the abdomen or pelvis. Specifically no evidence of bowel obstruction or acute bowel inflammation. 2. Left-sided colonic diverticulosis and a few small bowel diverticula without evidence of acute diverticulitis. 3. Small hiatal hernia. 4. Aortic atherosclerosis. Aortic Atherosclerosis (ICD10-I70.0). Electronically Signed   By: JDahlia BailiffMD   On: 11/26/2020 16:01      Impression / Plan:   EPressley BarskySenior is a 87y.o. female  with diabetes, CKD, carotid endarterectomy, colonic diverticulosis, history of shingles in left flank s/p  treatment in early April 2022 is admitted with left lower quadrant pain and dark stools, patient also complains of epigastric pain.    Dark stools, epigastric pain and left lower quadrant pain: No evidence of acute diverticulitis, acute pancreatitis based on the CT scan, normal lipase Hemoglobin is normal She had mild anemia which was transient.  It is possible that patient may have postherpetic neuralgia which could explain left lower quadrant pain.  Although, patient is not anemic.  However she reports epigastric pain and black tarry stools.  She is overall functionally independent and lives alone.  Her upper endoscopy and colonoscopy were about 10 years ago.  Therefore, it is reasonable to perform endoscopy procedures to evaluate the cause of her GI symptoms.  However, Patient is also treated for hypertensive urgency and currently her blood pressure is borderline low.  Antihypertensive medications have been held.  If her blood pressure returns to normal limits, we can perform these procedures prior to discharge or as outpatient  Continue maintenance IV fluids Continue Protonix 40 mg IV twice daily Monitor CBC daily  I have discussed my recommendations with both patient in person and her son over phone  Thank you for involving me in the care of this patient.      LOS: 0 days   Sherri Sear, MD  11/27/2020, 7:33 PM   Note: This dictation was prepared with Dragon dictation along with smaller phrase technology. Any transcriptional errors that result from this process are unintentional.

## 2020-11-27 NOTE — Progress Notes (Addendum)
[facility] [place] NAME: Dawn Mcintyre    MR#:  [id number]  DATE OF BIRTH:  1933-06-24  SUBJECTIVE:  CHIEF COMPLAINT:   Chief Complaint  Patient presents with  . Herpes Zoster  . Flank Pain  c/o abd pain and not comfortable, feels nauseous and may vomit  REVIEW OF SYSTEMS:  Review of Systems  Constitutional: Negative for diaphoresis, fever, malaise/fatigue and weight loss.  HENT: Negative for ear discharge, ear pain, hearing loss, nosebleeds, sore throat and tinnitus.   Eyes: Negative for blurred vision and pain.  Respiratory: Negative for cough, hemoptysis, shortness of breath and wheezing.   Cardiovascular: Negative for chest pain, palpitations, orthopnea and leg swelling.  Gastrointestinal: Positive for abdominal pain, nausea and vomiting. Negative for blood in stool, constipation, diarrhea and heartburn.  Genitourinary: Negative for dysuria, frequency and urgency.  Musculoskeletal: Negative for back pain and myalgias.  Skin: Positive for rash. Negative for itching.       Shingles dried lesion  Neurological: Negative for dizziness, tingling, tremors, focal weakness, seizures, weakness and headaches.  Psychiatric/Behavioral: Negative for depression. The patient is not nervous/anxious.    DRUG ALLERGIES:   Allergies  Allergen Reactions  . Amoxicillin-Pot Clavulanate Other (See Comments)    Unknown reaction Has patient had a PCN reaction causing immediate rash, facial/tongue/throat swelling, SOB or lightheadedness with hypotension: Unknown Has patient had a PCN reaction causing severe rash involving mucus membranes or skin necrosis: Unknown Has patient had a PCN reaction that required hospitalization: Unknown Has patient had a PCN reaction occurring within the last 10 years: No If all of the above answers are "NO", then may proceed with Cephalosporin use.   . Codeine Hives  . Hydrocodone Nausea Only  . Metronidazole Nausea Only  . Propoxyphene  Nausea Only  . Latex Rash   VITALS:  Blood pressure (!) 86/52, pulse 79, temperature 98.3 F (36.8 C), resp. rate 16, height 5\' 7"  (1.702 m), weight 60.8 kg, SpO2 95 %. PHYSICAL EXAMINATION:  Physical Exam  37 y f lying in the bed in some discomfort. Eyes: PERRLA Lungs: CTA b/l, no wheezing or rales Cardio: s1,s2 normal Neuro - alert and oriented, non focal Abd: soft, epigastric tenderness, she has some radicular pain/tenderness around her shingles lesion Skin: dry crusted shingles lesions around mid abd LABORATORY PANEL:  Female CBC Recent Labs  Lab 11/27/20 0415  WBC 10.9*  HGB 13.5  HCT 40.3  PLT 182   ------------------------------------------------------------------------------------------------------------------ Chemistries  Recent Labs  Lab 11/26/20 1417 11/27/20 0415  NA 139 136  K 3.1* 3.2*  CL 101 97*  CO2 29 27  GLUCOSE 122* 133*  BUN 13 8  CREATININE 0.93 0.70  CALCIUM 10.0 10.1  MG  --  1.3*  AST 18  --   ALT 9  --   ALKPHOS 51  --   BILITOT 0.6  --    RADIOLOGY:  No results found. ASSESSMENT AND PLAN:  87 y.o. female with medical history significant of HTN, HLD, carotid stenosis s/p b/l CEA's, PAD, CAD, GERD, borderline type 2 diabetes, neuropathy, CKD 3, anxiety, insomnia admitted for left lower quadrant abdominal pain and dark stools for 4-5 days  Left lower quadrant abdominal pain / Melena - slight drop in Hb - pending GI c/s - increase protonix to 40 mg BID - CLD for now  Hypertensive urgency with Hx of HTN - now hypotensive. Will hold all meds except imdur for now.  Hypokalemia - replete and recheck,  check mg  Shingles/post herpetic neuralgia - continue Valtrex and increase gabapentin to 300 mg po tid (was on 200 mg TID)  CAD / PAD / Carotid stenosis - hold ASA Chest discomfort appears GI related, has been ongoing issue and PCP referred to GI (appt this Tues). EKG and troponin normal. --repeat Trop for trend - will request cardio  clearance for GI procedure  Anxiety / Insomnia - continue home Xanax  Borderline type 2 diabetes - sugars well controlled for now.  C/s palliative care for GOC   Body mass index is 20.99 kg/m.  Net IO Since Admission: 1,040.76 mL [11/27/20 1829]      Status is: Observation  The patient remains OBS appropriate and will d/c before 2 midnights.  Dispo: The patient is from: Home              Anticipated d/c is to: Home              Patient currently is not medically stable to d/c.   Difficult to place patient No    DVT prophylaxis:       SCDs Start: 11/26/20 1706     Family Communication: Updated son over phone on 4/25   All the records are reviewed and case discussed with Care Management/Social Worker. Management plans discussed with the patient, family and they are in agreement.  CODE STATUS: Full Code Level of care: Med-Surg  TOTAL TIME TAKING CARE OF THIS PATIENT: 35 minutes.   More than 50% of the time was spent in counseling/coordination of care: YES  POSSIBLE D/C IN 1-2 DAYS, DEPENDING ON CLINICAL CONDITION.   Max Sane M.D on 11/27/2020 at 6:29 PM  [facility]   CC: Primary care physician; Idelle Crouch, MD  Note: This dictation was prepared with Dragon dictation along with smaller phrase technology. Any transcriptional errors that result from this process are unintentional.

## 2020-11-27 NOTE — Progress Notes (Signed)
Pt c/o nausea, vomiting, Hassan Rowan NP secured chatted

## 2020-11-28 ENCOUNTER — Encounter
Admission: EM | Disposition: A | Discharge status: Home / Self Care | Payer: Self-pay | Source: Home / Self Care | Attending: Emergency Medicine

## 2020-11-28 ENCOUNTER — Other Ambulatory Visit: Payer: Self-pay

## 2020-11-28 ENCOUNTER — Observation Stay: Payer: Medicare Other | Admitting: Anesthesiology

## 2020-11-28 DIAGNOSIS — B0229 Other postherpetic nervous system involvement: Secondary | ICD-10-CM

## 2020-11-28 DIAGNOSIS — K921 Melena: Secondary | ICD-10-CM

## 2020-11-28 DIAGNOSIS — I25118 Atherosclerotic heart disease of native coronary artery with other forms of angina pectoris: Secondary | ICD-10-CM

## 2020-11-28 DIAGNOSIS — R1032 Left lower quadrant pain: Secondary | ICD-10-CM

## 2020-11-28 HISTORY — PX: ESOPHAGOGASTRODUODENOSCOPY (EGD) WITH PROPOFOL: SHX5813

## 2020-11-28 LAB — BASIC METABOLIC PANEL
Anion gap: 7 (ref 5–15)
BUN: 17 mg/dL (ref 8–23)
CO2: 25 mmol/L (ref 22–32)
Calcium: 10 mg/dL (ref 8.9–10.3)
Chloride: 100 mmol/L (ref 98–111)
Creatinine, Ser: 1.27 mg/dL — ABNORMAL HIGH (ref 0.44–1.00)
GFR, Estimated: 41 mL/min — ABNORMAL LOW (ref >60)
Glucose, Bld: 105 mg/dL — ABNORMAL HIGH (ref 70–99)
Potassium: 3.8 mmol/L (ref 3.5–5.1)
Sodium: 132 mmol/L — ABNORMAL LOW (ref 135–145)

## 2020-11-28 LAB — CBC
HCT: 34.5 % — ABNORMAL LOW (ref 36.0–46.0)
Hemoglobin: 11.5 g/dL — ABNORMAL LOW (ref 12.0–15.0)
MCH: 31.9 pg (ref 26.0–34.0)
MCHC: 33.3 g/dL (ref 30.0–36.0)
MCV: 95.8 fL (ref 80.0–100.0)
Platelets: 183 10*3/uL (ref 150–400)
RBC: 3.6 MIL/uL — ABNORMAL LOW (ref 3.87–5.11)
RDW: 13.5 % (ref 11.5–15.5)
WBC: 7.5 10*3/uL (ref 4.0–10.5)
nRBC: 0 % (ref 0.0–0.2)

## 2020-11-28 SURGERY — ESOPHAGOGASTRODUODENOSCOPY (EGD) WITH PROPOFOL
Anesthesia: General

## 2020-11-28 MED ORDER — PROPOFOL 10 MG/ML IV BOLUS
INTRAVENOUS | Status: DC | PRN
  Administered 2020-11-28: 20 mg via INTRAVENOUS
  Administered 2020-11-28: 50 mg via INTRAVENOUS

## 2020-11-28 MED ORDER — PANTOPRAZOLE SODIUM 40 MG PO TBEC: 40.0000 mg | DELAYED_RELEASE_TABLET | Freq: Two times a day (BID) | ORAL | 0 refills | Status: DC

## 2020-11-28 MED ORDER — METOPROLOL TARTRATE 50 MG PO TABS
50.0000 mg | ORAL_TABLET | Freq: Two times a day (BID) | ORAL | Status: DC
  Administered 2020-11-28: 50 mg via ORAL
  Filled 2020-11-28: qty 1

## 2020-11-28 MED ORDER — OXYCODONE-ACETAMINOPHEN 5-325 MG PO TABS: 1.0000 | ORAL_TABLET | ORAL | 0 refills | Status: AC | PRN

## 2020-11-28 MED ORDER — PROPOFOL 500 MG/50ML IV EMUL
INTRAVENOUS | Status: AC
  Filled 2020-11-28: qty 50

## 2020-11-28 MED ORDER — GABAPENTIN 300 MG PO CAPS: 300.0000 mg | ORAL_CAPSULE | Freq: Three times a day (TID) | ORAL | 0 refills | Status: DC

## 2020-11-28 MED ORDER — VALACYCLOVIR HCL 1 G PO TABS
1000.0000 mg | ORAL_TABLET | Freq: Three times a day (TID) | ORAL | 0 refills | Status: DC
Start: 2020-11-28 — End: 2021-09-19

## 2020-11-28 MED ORDER — SODIUM CHLORIDE 0.9 % IV SOLN: INTRAVENOUS | Status: DC

## 2020-11-28 MED ORDER — LIDOCAINE HCL (PF) 2 % IJ SOLN
INTRAMUSCULAR | Status: AC
  Filled 2020-11-28: qty 5

## 2020-11-28 NOTE — Progress Notes (Signed)
     Referral received for [place] :goals of care discussion. Chart reviewed and updates received.  Patient is currently off unit for EGD.   PMT will re-attempt to visit patient/contact family at a later time/date. Detailed note and recommendations to follow once GOC has been completed.   Thank you for your referral and allowing PMT to assist in Mrs. Dawn Mcintyre's care.   Alda Lea, AGPCNP-BC Palliative Medicine Team  Phone: [phone] Amion: N. [place]   NO CHARGE

## 2020-11-28 NOTE — Consult Note (Signed)
[facility] Consultation Note  Patient ID: Dawn Mcintyre, MRN: [id number], DOB/AGE: 1933-02-16 87 y.o. Admit date: 11/26/2020   Date of Consult: 11/28/2020 Primary Physician: Idelle Crouch, MD Primary Cardiologist: [facility]  Chief Complaint:  Chief Complaint  Patient presents with  . Herpes Zoster  . Flank Pain   Reason for Consult: Peripheral vascular disease cardiovascular disease  HPI: 87 y.o. female with known coronary artery disease with no previous history of cardiovascular event diabetes hypertension hyperlipidemia peripheral vascular disease status post previous carotid endarterectomy who is done fairly well from the cardiovascular standpoint with no evidence of recent symptoms of chest discomfort PND orthopnea syncope dizziness nausea or diaphoresis having no evidence of congestive heart failure.  The patient has had recent issues with shingles as well as diverticulitis for which she has had abdominal discomfort and needing further treatment.  She was admitted to the hospital for the significant symptoms and placed on antibiotics and IV fluids.  Since then she has done fairly well with no further significant symptoms.  The patient currently is hemodynamically stable and currently is at low risk for cardiovascular complication with further intervention and/or treatment of diverticulitis  Past Medical History:  Diagnosis Date  . Arteritis ([place])   . Arthritis   . Atrophic vaginitis 11/16/2014  . Carotid artery stenosis   . Cataract   . Difficult intubation   . Environmental and seasonal allergies   . GERD (gastroesophageal reflux disease)   . Hyperlipidemia   . Hypertension   . Lack of bladder control   . Neuropathy   . Osteoporosis   . Pneumonia     x 76yrs ago  . Pre-diabetes   . Shingles   . Ulcer       Surgical History:  Past Surgical History:  Procedure Laterality Date  . ABDOMINAL HYSTERECTOMY  1972  . BREAST CYST EXCISION Right 1972  .  BREAST CYST EXCISION Left 1972  . ENDARTERECTOMY Left 12/24/2017   Procedure: ENDARTERECTOMY CAROTID;  Surgeon: Katha Cabal, MD;  Location: [facility] ORS;  Service: Vascular;  Laterality: Left;  . ENDARTERECTOMY Right 03/13/2018   Procedure: ENDARTERECTOMY CAROTID;  Surgeon: Katha Cabal, MD;  Location: [facility] ORS;  Service: Vascular;  Laterality: Right;  . EYE SURGERY    . THROAT SURGERY     x 10 yrs ago. throat mass.  . TONSILLECTOMY       Home Meds: Prior to Admission medications   Medication Sig Start Date End Date Taking? Authorizing Provider  acetaminophen (TYLENOL) 500 MG tablet Take 500 mg by mouth daily as needed for headache.   Yes [provider]  ALPRAZolam (XANAX) 0.25 MG tablet Take 0.25 mg by mouth at bedtime as needed for sleep.   Yes [provider]  ascorbic acid (VITAMIN C) 1000 MG tablet Take 1,000 mg by mouth daily.   Yes [provider]  aspirin 81 MG tablet Take 81 mg by mouth daily.   Yes [provider]  Cholecalciferol (VITAMIN D3) 1.25 MG (50000 UT) CAPS Take by mouth.   Yes [provider]  conjugated estrogens (PREMARIN) vaginal cream Apply one pea-sized amount around the opening of the urethra three times weekly. 10/19/20  Yes Vaillancourt, Samantha, PA-C  docusate sodium (COLACE) 100 MG capsule Take 1 capsule (100 mg total) by mouth 2 (two) times daily. 11/04/20 12/04/20 Yes Harvest Dark, MD  felodipine (PLENDIL) 2.5 MG 24 hr tablet Take 2.5 mg by mouth daily. May take a second 2.5  mg dose as needed if bp is over 150/90   Yes [provider]  fexofenadine (ALLEGRA) 180 MG tablet Take 180 mg by mouth daily as needed for allergies.    Yes [provider]  gabapentin (NEURONTIN) 100 MG capsule Take 100 mg by mouth at bedtime.   Yes [provider]  hydrochlorothiazide (HYDRODIURIL) 12.5 MG tablet Take 1 tablet by mouth daily. 06/15/20  Yes [provider]  isosorbide mononitrate  (IMDUR) 60 MG 24 hr tablet Take 60 mg by mouth daily.   Yes [provider]  losartan (COZAAR) 100 MG tablet Take 1 tablet by mouth daily. 06/15/20  Yes [provider]  metoprolol tartrate (LOPRESSOR) 100 MG tablet Take 100 mg by mouth 2 (two) times daily. 09/27/20  Yes [provider]  mirabegron ER (MYRBETRIQ) 25 MG TB24 tablet Take by mouth. 11/24/19  Yes [provider]  oxyCODONE-acetaminophen (PERCOCET) 5-325 MG tablet Take 1 tablet by mouth every 4 (four) hours as needed for severe pain. 11/09/20  Yes Harvest Dark, MD  valACYclovir (VALTREX) 1000 MG tablet Take 1 tablet (1,000 mg total) by mouth 3 (three) times daily. 11/09/20  Yes Harvest Dark, MD  vitamin E 180 MG (400 UNITS) capsule Take 400 Units by mouth daily.   Yes [provider]  cloNIDine (CATAPRES) 0.1 MG tablet Take 0.1 mg by mouth at bedtime. 07/24/20   [provider]    Inpatient Medications:  . docusate sodium  100 mg Oral BID  . gabapentin  300 mg Oral TID  . isosorbide mononitrate  60 mg Oral Daily  . lidocaine  2 patch Transdermal Q24H  . loratadine  10 mg Oral Daily  . mirabegron ER  25 mg Oral Daily  . pantoprazole (PROTONIX) IV  40 mg Intravenous Q12H  . senna-docusate  1 tablet Oral Daily  . valACYclovir  1,000 mg Oral TID   . sodium chloride Stopped (11/27/20 1142)    Allergies:  Allergies  Allergen Reactions  . Amoxicillin-Pot Clavulanate Other (See Comments)    Unknown reaction Has patient had a PCN reaction causing immediate rash, facial/tongue/throat swelling, SOB or lightheadedness with hypotension: Unknown Has patient had a PCN reaction causing severe rash involving mucus membranes or skin necrosis: Unknown Has patient had a PCN reaction that required hospitalization: Unknown Has patient had a PCN reaction occurring within the last 10 years: No If all of the above answers are "NO", then may proceed with Cephalosporin use.   . Codeine  Hives  . Hydrocodone Nausea Only  . Metronidazole Nausea Only  . Propoxyphene Nausea Only  . Latex Rash    Social History   Socioeconomic History  . Marital status: Widowed    Spouse name: Not on file  . Number of children: Not on file  . Years of education: Not on file  . Highest education level: Not on file  Occupational History  . Not on file  Tobacco Use  . Smoking status: Never Smoker  . Smokeless tobacco: Never Used  Vaping Use  . Vaping Use: Never used  Substance and Sexual Activity  . Alcohol use: No    Alcohol/week: 0.0 standard drinks  . Drug use: No  . Sexual activity: Not Currently  Other Topics Concern  . Not on file  Social History Narrative  . Not on file   Social Determinants of Health   Financial Resource Strain: Not on file  Food Insecurity: Not on file  Transportation Needs: Not on file  Physical Activity: Not on file  Stress: Not on file  Social Connections: Not on file  Intimate Partner Violence: Not on file     Family History  Problem Relation Age of Onset  . Anuerysm Mother   . Cancer Father        prostrate cancer  . Diabetes Brother   . Hearing loss Brother   . Bladder Cancer Neg Hx   . Kidney cancer Neg Hx      Review of Systems Positive for diverticulitis abdominal pain Negative for: General:  chills, fever, night sweats or weight changes.  Cardiovascular: PND orthopnea syncope dizziness  Dermatological skin lesions rashes Respiratory: Cough congestion Urologic: Frequent urination urination at night and hematuria Abdominal: negative for nausea, vomiting, diarrhea, bright red blood per rectum, melena, or hematemesis Neurologic: negative for visual changes, and/or hearing changes  All other systems reviewed and are otherwise negative except as noted above.  Labs: No results for input(s): CKTOTAL, CKMB, TROPONINI in the last 72 hours. Lab Results  Component Value Date   WBC 7.5 11/28/2020   HGB 11.5 (L) 11/28/2020   HCT  34.5 (L) 11/28/2020   MCV 95.8 11/28/2020   PLT 183 11/28/2020    Recent Labs  Lab 11/26/20 1417 11/27/20 0415 11/28/20 0447  NA 139   < > 132*  K 3.1*   < > 3.8  CL 101   < > 100  CO2 29   < > 25  BUN 13   < > 17  CREATININE 0.93   < > 1.27*  CALCIUM 10.0   < > 10.0  PROT 6.0*  --   --   BILITOT 0.6  --   --   ALKPHOS 51  --   --   ALT 9  --   --   AST 18  --   --   GLUCOSE 122*   < > 105*   < > = values in this interval not displayed.   No results found for: CHOL, HDL, LDLCALC, TRIG No results found for: DDIMER  Radiology/Studies:  CT ABDOMEN PELVIS W CONTRAST  Result Date: 11/26/2020 CLINICAL DATA:  Left lower quadrant abdominal pain, melena and diarrhea. EXAM: CT ABDOMEN AND PELVIS WITH CONTRAST TECHNIQUE: Multidetector CT imaging of the abdomen and pelvis was performed using the standard protocol following bolus administration of intravenous contrast. CONTRAST:  128mL OMNIPAQUE IOHEXOL 300 MG/ML  SOLN COMPARISON:  November 06, 2020 and November 04, 2020. FINDINGS: Lower chest: Hypoventilatory change in the dependent lungs. Cardiac enlargement. No significant pericardial effusion/thickening. Hepatobiliary: Again seen is a small hypervascular focus in the central liver on image 12/2 which is unchanged dating back to 08/02/2013 and considered benign likely a intrahepatic shunt versus hemangioma. No additional hepatic lesions. Gallbladder is grossly unremarkable. No biliary ductal dilation. Pancreas: Similar mild diffuse pancreatic ductal dilation measuring 3-4 mm in diameter, this is chronic and not substantially changed dating back to 2014. No discrete pancreatic masses or evidence of acute inflammation. Spleen: Normal in size without focal abnormality. Adrenals/Urinary Tract: Bilateral adrenal glands are unremarkable. No hydronephrosis. No solid enhancing renal lesions. Urinary bladder is grossly unremarkable for degree of distension. Stomach/Bowel: Small hiatal hernia otherwise  unremarkable appearance of the none distended stomach. No suspicious small bowel wall thickening. Scattered small bowel diverticula without evidence of acute diverticulosis. The appendix is not discretely visualized however there is no pericecal inflammation. Left-sided colonic diverticulosis without findings of acute diverticulitis. Vascular/Lymphatic: Aortic atherosclerosis. No abdominal aortic aneurysm. No enlarged  abdominal or pelvic lymph nodes. Reproductive: Status post hysterectomy. No adnexal masses. Other: No pneumoperitoneum.  No abdominopelvic ascites. Musculoskeletal: Multilevel degenerative change of the spine. No acute osseous abnormality. IMPRESSION: 1. No acute no acute abnormality in the abdomen or pelvis. Specifically no evidence of bowel obstruction or acute bowel inflammation. 2. Left-sided colonic diverticulosis and a few small bowel diverticula without evidence of acute diverticulitis. 3. Small hiatal hernia. 4. Aortic atherosclerosis. Aortic Atherosclerosis (ICD10-I70.0). Electronically Signed   By: Dahlia Bailiff MD   On: 11/26/2020 16:01   CT ABDOMEN PELVIS W CONTRAST  Result Date: 11/04/2020 CLINICAL DATA:  Left lower abdominal pain since last night. EXAM: CT ABDOMEN AND PELVIS WITH CONTRAST TECHNIQUE: Multidetector CT imaging of the abdomen and pelvis was performed using the standard protocol following bolus administration of intravenous contrast. CONTRAST:  21mL OMNIPAQUE IOHEXOL 300 MG/ML  SOLN COMPARISON:  11/03/2020 CT abdomen/pelvis. FINDINGS: Lower chest: Hypoventilatory changes at the dependent lung bases. Hepatobiliary: Normal liver size. Small hypervascular focus in the central liver (series 7/image 15), unchanged since 08/02/2013 CT and considered benign, suggestive of venovenous shunt versus hemangioma. No additional liver lesions. Vicarious excretion of IV contrast in the nondistended gallbladder with no definite radiopaque gallstones. No gallbladder wall thickening. No  biliary ductal dilatation. Pancreas: Mild diffuse pancreatic duct dilation (3-4 mm diameter), chronic and not substantially changed back to 2014 CT. No discrete pancreatic mass. Spleen: Normal size. No mass. Adrenals/Urinary Tract: Normal adrenals. No hydronephrosis. No renal masses. Normal bladder. Stomach/Bowel: Small hiatal hernia. Otherwise normal nondistended stomach. Normal caliber small bowel with no small bowel wall thickening. Appendix not discretely visualized. No pericecal inflammatory changes. Moderate left colonic diverticulosis. No definite large bowel wall thickening or significant pericolonic fat stranding. Vascular/Lymphatic: Atherosclerotic nonaneurysmal abdominal aorta. Patent portal, splenic, hepatic and renal veins. No pathologically enlarged lymph nodes in the abdomen or pelvis. Reproductive: Status post hysterectomy, with no abnormal findings at the vaginal cuff. No adnexal mass. Other: No pneumoperitoneum, ascites or focal fluid collection. Musculoskeletal: No aggressive appearing focal osseous lesions. Moderate lower lumbar degenerative disc disease. IMPRESSION: 1. No acute abnormality. No evidence of bowel obstruction or acute bowel inflammation. Moderate left colonic diverticulosis, with no evidence of acute diverticulitis. 2. Small hiatal hernia. 3. Aortic Atherosclerosis (ICD10-I70.0). Electronically Signed   By: Ilona Sorrel M.D.   On: 11/04/2020 09:15   CT ABDOMEN PELVIS W CONTRAST  Result Date: 11/03/2020 CLINICAL DATA:  Left lower quadrant pain EXAM: CT ABDOMEN AND PELVIS WITH CONTRAST TECHNIQUE: Multidetector CT imaging of the abdomen and pelvis was performed using the standard protocol following bolus administration of intravenous contrast. CONTRAST:  98mL OMNIPAQUE IOHEXOL 300 MG/ML  SOLN COMPARISON:  05/11/2019, 06/17/2016 FINDINGS: Lower chest: Lung bases demonstrate no acute consolidation or effusion. Coronary vascular calcification. Hepatobiliary: Subcentimeter  hypodensities in the posterior right hepatic lobe too small to further characterize. No calcified gallstone or biliary dilatation. Pancreas: No inflammatory change or ductal dilatation. Spleen: Vague hypodense lesions within the spleen, for example 8 mm lesion anterior spleen, series 2, image number 19, 11 mm vague hypodense lesion superiorly, series 2, image number 13 Adrenals/Urinary Tract: And adrenal glands are normal. Minimal right hydronephrosis and hydroureter but no evidence for obstructing stone. The bladder is unremarkable. Stomach/Bowel: The stomach is nonenlarged. No dilated small bowel. Appendix not seen but no right lower quadrant inflammatory process. Diverticular disease of the left colon without acute wall thickening. Vascular/Lymphatic: Moderate aortic atherosclerosis. No aneurysm. No suspicious adenopathy Reproductive: Status post hysterectomy. No adnexal masses. Other:  Negative for free air or free fluid Musculoskeletal: Grade 1 anterolisthesis L4 on L5 with degenerative changes at L5-S1. No acute or suspicious osseous abnormality IMPRESSION: 1. No CT evidence for acute intra-abdominal or pelvic abnormality. 2. Diverticular disease of the left colon without acute wall thickening. 3. Minimal right hydronephrosis and hydroureter but no evidence for obstructing stone. 4. Vague indeterminate hypodense lesions within the spleen, not clearly visualized on prior exam. When the patient is clinically stable and able to follow directions and hold their breath (preferably as an outpatient) further evaluation with dedicated abdominal MRI should be considered. Aortic Atherosclerosis (ICD10-I70.0). Electronically Signed   By: Donavan Foil M.D.   On: 11/03/2020 19:22   CT Angio Abd/Pel W and/or Wo Contrast  Result Date: 11/06/2020 CLINICAL DATA:  Mesenteric ischemia. Acute 4 days of severe left lower quadrant pain. Two CT scans with reassuring labs. Vomited today. EXAM: CTA ABDOMEN AND PELVIS WITHOUT AND  WITH CONTRAST TECHNIQUE: Multidetector CT imaging of the abdomen and pelvis was performed using the standard protocol during bolus administration of intravenous contrast. Multiplanar reconstructed images and MIPs were obtained and reviewed to evaluate the vascular anatomy. CONTRAST:  129mL OMNIPAQUE IOHEXOL 350 MG/ML SOLN COMPARISON:  CT abdomen pelvis 11/03/2020 and 11/04/2020 FINDINGS: VASCULAR Aorta: Moderate to severe calcified and noncalcified atherosclerotic plaque. Normal caliber aorta without aneurysm, dissection, vasculitis or significant stenosis. Celiac: Moderate to severe calcified and noncalcified atherosclerotic plaque at the origin. Patent without evidence of aneurysm, dissection, vasculitis or significant stenosis. SMA: Moderate to severe calcified and noncalcified atherosclerotic plaque at the origin. Patent without evidence of aneurysm, dissection, vasculitis or significant stenosis. Renals: Moderate to severe calcified and noncalcified atherosclerotic plaque at the origin. Both renal arteries are patent without evidence of aneurysm, dissection, vasculitis, fibromuscular dysplasia or significant stenosis. IMA: Patent without evidence of aneurysm, dissection, vasculitis or significant stenosis. Inflow: Patent without evidence of aneurysm, dissection, vasculitis or significant stenosis. Proximal Outflow: Moderate calcified and noncalcified atherosclerotic plaque.Bilateral common femoral and visualized portions of the superficial and profunda femoral arteries are patent without evidence of aneurysm, dissection, vasculitis or significant stenosis. Veins: The portal, splenic, superior mesenteric veins are patent. Review of the MIP images confirms the above findings. NON-VASCULAR Lower chest: Bilateral lower lobe subsegmental atelectasis. Tiny hiatal hernia. Hepatobiliary: No focal liver abnormality. Vague layering hyperdensity within the gallbladder lumen likely represents vicarious excretion of  previously administered intravenous contrast. No gallstones, gallbladder wall thickening, or pericholecystic fluid. No biliary dilatation. Pancreas: No focal lesion. Normal pancreatic contour. No surrounding inflammatory changes. No main pancreatic ductal dilatation. Spleen: Normal size.  Vague hypodensity is again noted. Adrenals/Urinary Tract: No adrenal nodule bilaterally. Bilateral kidneys enhance symmetrically. Subcentimeter hypodensities are too small to characterize. Bilateral renal cortical scarring. No hydronephrosis. No hydroureter. Tiny foci of gas within the urinary bladder lumen. Stomach/Bowel: PO contrast reaches the rectum. Stomach is within normal limits. No evidence of bowel wall thickening or dilatation. No pneumatosis. Scattered small bowel and colonic diverticulosis. The appendix not definitely identified and likely surgically removed. Lymphatic: No lymphadenopathy. Reproductive: Status post hysterectomy. No adnexal masses. Other: No intraperitoneal free fluid. No intraperitoneal free gas. No organized fluid collection. Musculoskeletal: No abdominal wall hernia or abnormality. No suspicious lytic or blastic osseous lesions. No acute displaced fracture. Grade 1 anterolisthesis of L4 on L5. IMPRESSION: VASCULAR 1. No acute abnormality. 2.  Aortic Atherosclerosis (ICD10-I70.0) -moderate to severe. NON-VASCULAR 1. Scattered colonic diverticulosis with no definite acute diverticulitis. Small bowel diverticulosis also noted. Slightly limited evaluation due to PO  contrast administered. 2. Tiny foci of gas within the urinary bladder lumen. Likely related to recent instrumentation. Correlate with clinical history. If clinically indicated, correlate with urinalysis for infection. 3. Otherwise no acute intra-abdominal or intrapelvic abnormality. Electronically Signed   By: Iven Finn M.D.   On: 11/06/2020 23:20    EKG: Normal sinus rhythm with nonspecific T wave changes unchanged from  before  Weights: Filed Weights   11/26/20 1357  Weight: 60.8 kg     Physical Exam: Blood pressure (!) 129/51, pulse 84, temperature 98 F (36.7 C), temperature source Oral, resp. rate 18, height 5\' 7"  (1.702 m), weight 60.8 kg, SpO2 96 %. Body mass index is 20.99 kg/m. General: Well developed, well nourished, in no acute distress. Head eyes ears nose throat: Normocephalic, atraumatic, sclera non-icteric, no xanthomas, nares are without discharge. No apparent thyromegaly and/or mass  Lungs: Normal respiratory effort.  no wheezes, no rales, no rhonchi.  Heart: RRR with normal S1 S2. no murmur gallop, no rub, PMI is normal size and placement, carotid upstroke normal without bruit, jugular venous pressure is normal Abdomen: Soft, slight left quadrant-tender, non-distended with normoactive bowel sounds. No hepatomegaly. No rebound/guarding. No obvious abdominal masses. Abdominal aorta is normal size without bruit Extremities: No edema. no cyanosis, no clubbing, no ulcers  Peripheral : 2+ bilateral upper extremity pulses, 2+ bilateral femoral pulses, 2+ bilateral dorsal pedal pulse Neuro: Alert and oriented. No facial asymmetry. No focal deficit. Moves all extremities spontaneously. Musculoskeletal: Normal muscle tone without kyphosis Psych:  Responds to questions appropriately with a normal affect.    Assessment: 87 year old female with known coronary artery atherosclerosis diabetes hypertension hyperlipidemia peripheral vascular disease with previous carotid endarterectomy having diverticulitis but no current evidence of congestive heart failure or angina and stable from the cardiovascular standpoint at lowest risk possible for cardiovascular complication with further GI treatment options  Plan: 1.  Continue supportive care and treatment for diverticulitis and/or shingles discomfort in her abdomen without restriction 2.  Proceed to diverticular assessment including colonoscopy if necessary  without restriction 3.  Okay for continuation of home isosorbide although the patient has not had any chest discomfort at this time 4.  Slow reinstatement of previous antihypertensive medication management as patient slowly gets and rehydrated from her current issues 5.  No further cardiac diagnostics necessary at this time  Signed, Corey Skains M.D. [facility] 11/28/2020, 8:32 AM

## 2020-11-28 NOTE — Anesthesia Preprocedure Evaluation (Signed)
Anesthesia Evaluation  Patient identified by MRN, date of birth, ID band Patient awake    Reviewed: Allergy & Precautions, H&P , NPO status , Patient's Chart, lab work & pertinent test results, reviewed documented beta blocker date and time   Airway Mallampati: II   Neck ROM: full    Dental  (+) Poor Dentition   Pulmonary pneumonia, resolved,    Pulmonary exam normal        Cardiovascular Exercise Tolerance: Poor hypertension, On Medications + CAD and + Peripheral Vascular Disease  Normal cardiovascular exam Rhythm:regular Rate:Normal     Neuro/Psych Anxiety  Neuromuscular disease negative psych ROS   GI/Hepatic Neg liver ROS, GERD  Medicated,  Endo/Other  diabetes, Well Controlled  Renal/GU Renal disease  negative genitourinary   Musculoskeletal   Abdominal   Peds  Hematology negative hematology ROS (+)   Anesthesia Other Findings Past Medical History: No date: Arteritis ([place]) No date: Arthritis 11/16/2014: Atrophic vaginitis No date: Carotid artery stenosis No date: Cataract No date: Difficult intubation No date: Environmental and seasonal allergies No date: GERD (gastroesophageal reflux disease) No date: Hyperlipidemia No date: Hypertension No date: Lack of bladder control No date: Neuropathy No date: Osteoporosis No date: Pneumonia     Comment:   x 64yrs ago No date: Pre-diabetes No date: Shingles No date: Ulcer Past Surgical History: 1972: ABDOMINAL HYSTERECTOMY 1972: BREAST CYST EXCISION; Right 1972: BREAST CYST EXCISION; Left 12/24/2017: ENDARTERECTOMY; Left     Comment:  Procedure: ENDARTERECTOMY CAROTID;  Surgeon: Katha Cabal, MD;  Location: [facility] ORS;  Service: Vascular;                Laterality: Left; 03/13/2018: ENDARTERECTOMY; Right     Comment:  Procedure: ENDARTERECTOMY CAROTID;  Surgeon: Katha Cabal, MD;  Location: [facility] ORS;  Service: Vascular;                 Laterality: Right; No date: EYE SURGERY No date: THROAT SURGERY     Comment:  x 10 yrs ago. throat mass. No date: TONSILLECTOMY BMI    Body Mass Index: 20.99 kg/m     Reproductive/Obstetrics negative OB ROS                             Anesthesia Physical Anesthesia Plan  ASA: III  Anesthesia Plan: General   Post-op Pain Management:    Induction:   PONV Risk Score and Plan:   Airway Management Planned:   Additional Equipment:   Intra-op Plan:   Post-operative Plan:   Informed Consent: I have reviewed the patients History and Physical, chart, labs and discussed the procedure including the risks, benefits and alternatives for the proposed anesthesia with the patient or authorized representative who has indicated his/her understanding and acceptance.     Dental Advisory Given  Plan Discussed with: CRNA  Anesthesia Plan Comments:         Anesthesia Quick Evaluation

## 2020-11-28 NOTE — Transfer of Care (Signed)
Immediate Anesthesia Transfer of Care Note  Patient: Dawn Mcintyre  Procedure(s) Performed: ESOPHAGOGASTRODUODENOSCOPY (EGD) WITH PROPOFOL (N/A )  Patient Location: PACU and Endoscopy Unit  Anesthesia Type:General  Level of Consciousness: drowsy and patient cooperative  Airway & Oxygen Therapy: Patient Spontanous Breathing  Post-op Assessment: Report given to RN and Post -op Vital signs reviewed and stable  Post vital signs: Reviewed and stable  Last Vitals:  Vitals Value Taken Time  BP 124/43 11/28/20 1228  Temp 36.4 C 11/28/20 1228  Pulse 93 11/28/20 1231  Resp 13 11/28/20 1231  SpO2 94 % 11/28/20 1231  Vitals shown include unvalidated device data.  Last Pain:  Vitals:   11/28/20 1228  TempSrc: Temporal  PainSc: Asleep         Complications: No complications documented.

## 2020-11-28 NOTE — Progress Notes (Signed)
Pt discharged home with her sister and nephew.  Discharge instructions provided and questions answered.  VSS.  Pt discharged via wheelchair.

## 2020-11-28 NOTE — Discharge Instructions (Signed)
Postherpetic Neuralgia Postherpetic neuralgia (PHN) is nerve pain that occurs after a shingles infection. Shingles is a painful rash that appears on one area of the body, usually on the trunk or face. Shingles is caused by the varicella-zoster virus. This is the same virus that causes chickenpox. In people who have had chickenpox, the virus can resurface years later and cause shingles. You may have PHN if you continue to have pain for 4 months after your shingles rash has gone away. PHN appears in the same area where you had the shingles rash. The pain usually goes away after the rash disappears. Getting a vaccination for shingles can prevent PHN. This vaccine is recommended for people older than 60. It may prevent shingles, and may also lower your risk of PHN if you do get shingles. What are the causes? This condition is caused by damage to your nerves from the varicella-zoster virus. The damage makes your nerves overly sensitive. What increases the risk? The following factors may make you more likely to develop this condition:  Being older than 85 years of age.  Having severe pain before your shingles rash starts.  Having a severe rash.  Having shingles in and around the eye area.  Having a disease that makes your body unable to fight infections (weak immune system). What are the signs or symptoms? The main symptom of this condition is pain. The pain may:  Often be very bad and may be described as stabbing, burning, or feeling like an electric shock.  Come and go or may be there all the time.  Be triggered by light touches on the skin or changes in temperature. You may have itching along with the pain. How is this diagnosed? This condition may be diagnosed based on your symptoms and your history of shingles. Lab studies and other diagnostic tests are usually not needed. How is this treated? There is no cure for this condition. Treatment for PHN will focus on pain relief.  Over-the-counter pain relievers do not usually relieve PHN pain. You may need to work with a pain specialist. Treatment may include:  Antidepressant medicines to help with pain and improve sleep.  Anti-seizure medicines to relieve nerve pain.  Strong pain relievers (opioids).  A numbing patch worn on the skin (lidocaine patch).  Botox (botulinum toxin) injections to block pain signals between nerves and muscles.  Injections of numbing medicine or anti-inflammatory medicines around irritated nerves. Follow these instructions at home:  It may take a long time to recover from PHN. Work closely with your health care provider and develop a good support system at home.  Take over-the-counter and prescription medicines only as told by your health care provider.  Do not drive or use heavy machinery while taking prescription pain medicine.  Wear loose, comfortable clothing.  Cover sensitive areas with a dressing to reduce friction from clothing rubbing on the area.  If directed, put ice on the painful area: ? Put ice in a plastic bag. ? Place a towel between your skin and the bag. ? Leave the ice on for 20 minutes, 2-3 times a day.  Talk to your health care provider if you feel depressed or desperate. Living with long-term pain can be depressing.  Keep all follow-up visits as told by your health care provider. This is important.   Contact a health care provider if:  Your medicine is not helping.  You are struggling to manage your pain at home. Summary  Postherpetic neuralgia is a very painful   disorder that can occur after an episode of shingles.  The pain is often severe, burning, electric, or stabbing.  Prescription medicines can be helpful in managing persistent pain.  Getting a vaccination for shingles can prevent PHN. This vaccine is recommended for people older than 60. This information is not intended to replace advice given to you by your health care provider. Make sure  you discuss any questions you have with your health care provider. Document Revised: 07/04/2017 Document Reviewed: 10/08/2016 Elsevier Patient Education  2021 Elsevier Inc.  

## 2020-11-28 NOTE — Progress Notes (Signed)
[place] [facility]) Hospital Liaison RN note:  Received new referral for [facility] out patient palliative program to follow post discharge from Dr. Manuella Ghazi. Referral has patient information. Plan is to discharge home today. [place] Scrape, TOC is aware. [place] Liaison will continue to follow for disposition.  Thank you for this referral.  Zandra Abts, RN [facility] Hospital Liaison [phone]

## 2020-11-28 NOTE — Op Note (Signed)
[facility] Gastroenterology Patient Name: Dawn Mcintyre Procedure Date: 11/28/2020 12:12 PM MRN: [id number] Account #: [id number] Date of Birth: Sep 27, 1932 Admit Type: Inpatient Age: 87 Room: [facility] ENDO ROOM 3 Gender: Female Note Status: Finalized Procedure:             Upper GI endoscopy Indications:           Melena Providers:             Lucilla Lame MD, MD Medicines:             Propofol per Anesthesia Complications:         No immediate complications. Procedure:             Pre-Anesthesia Assessment:                        - Prior to the procedure, a History and Physical was                         performed, and patient medications and allergies were                         reviewed. The patient's tolerance of previous                         anesthesia was also reviewed. The risks and benefits                         of the procedure and the sedation options and risks                         were discussed with the patient. All questions were                         answered, and informed consent was obtained. Prior                         Anticoagulants: The patient has taken no previous                         anticoagulant or antiplatelet agents. ASA Grade                         Assessment: III - A patient with severe systemic                         disease. After reviewing the risks and benefits, the                         patient was deemed in satisfactory condition to                         undergo the procedure.                        After obtaining informed consent, the endoscope was                         passed under direct vision. Throughout the procedure,  the patient's blood pressure, pulse, and oxygen                         saturations were monitored continuously. The Endoscope                         was introduced through the mouth, and advanced to the                         second part of duodenum. The upper GI  endoscopy was                         accomplished without difficulty. The patient tolerated                         the procedure well. Findings:      A small hiatal hernia was present.      The Z-line was irregular and was found at the gastroesophageal junction.      The stomach was normal.      The examined duodenum was normal. Impression:            - Small hiatal hernia.                        - Z-line irregular, at the gastroesophageal junction.                        - Normal stomach.                        - Normal examined duodenum.                        - No specimens collected. Recommendation:        - Return patient to hospital ward for ongoing care.                        - Clear liquid diet.                        - Continue present medications. Procedure Code(s):     --- Professional ---                        [phone], Esophagogastroduodenoscopy, flexible,                         transoral; diagnostic, including collection of                         specimen(s) by brushing or washing, when performed                         (separate procedure) Diagnosis Code(s):     --- Professional ---                        K92.1, Melena (includes Hematochezia)                        K22.8, Other specified diseases of esophagus CPT copyright 2019 American Medical Association. All rights reserved.  The codes documented in this report are preliminary and upon coder review may  be revised to meet current compliance requirements. Lucilla Lame MD, MD 11/28/2020 12:24:27 PM This report has been signed electronically. Number of Addenda: 0 Note Initiated On: 11/28/2020 12:12 PM Estimated Blood Loss:  Estimated blood loss: none.      [facility]

## 2020-11-29 ENCOUNTER — Telehealth: Payer: Self-pay | Admitting: Primary Care

## 2020-11-29 ENCOUNTER — Encounter: Payer: Self-pay | Admitting: Gastroenterology

## 2020-11-29 NOTE — Telephone Encounter (Signed)
Called patient's home number to offer to schedule a Palliative Consult, no answer and no voicemail set up.  I then called patient's listed cell number with no answer, left message with reason for call along with my name and call back number.

## 2020-11-29 NOTE — Discharge Summary (Addendum)
Dawn Mcintyre at [place] NAME: Dawn Mcintyre    MR#:  [id number]  DATE OF BIRTH:  [birth year]  DATE OF ADMISSION:  11/26/2020   ADMITTING PHYSICIAN: Ezekiel Slocumb, DO  DATE OF DISCHARGE: 11/28/2020  5:19 PM  PRIMARY CARE PHYSICIAN: Idelle Crouch, MD   ADMISSION DIAGNOSIS:  Melena [K92.1] LLQ abdominal pain [R10.32] Abdominal pain [R10.9] DISCHARGE DIAGNOSIS:  Active Problems:   Abdominal pain   Melena   LLQ abdominal pain   Post herpetic neuralgia  SECONDARY DIAGNOSIS:   Past Medical History:  Diagnosis Date  . Arteritis ([place])   . Arthritis   . Atrophic vaginitis 11/16/2014  . Carotid artery stenosis   . Cataract   . Difficult intubation   . Environmental and seasonal allergies   . GERD (gastroesophageal reflux disease)   . Hyperlipidemia   . Hypertension   . Lack of bladder control   . Neuropathy   . Osteoporosis   . Pneumonia     x 40yrs ago  . Pre-diabetes   . Shingles   . Ulcer    HOSPITAL COURSE:  87 y.o.femalewith medical history significant ofHTN, HLD, carotid stenosis s/p b/l CEA's, PAD, CAD, GERD, borderline type 2 diabetes, neuropathy, CKD 3, anxiety, insomnia admitted for left lower quadrant abdominal pain and dark stools for 4-5 days  Left lower quadrant abdominal pain / Melena- stable H & H, her pain was mainly from postherpetic neuralgia. - s/p EGD on 4/26 showing a small hiatal hernia, Z-line was irregular and was found at the gastroesophageal junction. Otherwise normal EGD. - increase protonix to 40 mg BID - tolerating diet - outpt GI f/up  Hypertensive urgencywith Hx of HTN- resolved, some anxiety issue  Hypokalemia- repleted  Shingles/post herpetic neuralgia- continue Valtrex and increase gabapentin to 300 mg po tid for symptom control  CAD / PAD / Carotid stenosis-  EKG and troponin normal. --outpt f/up with cardio  Anxiety / Insomnia- continue home Xanax  Borderline type 2 diabetes-  sugars well controlled for now.   DISCHARGE CONDITIONS:  stable CONSULTS OBTAINED:  Treatment Team:  Lin Landsman, MD DRUG ALLERGIES:   Allergies  Allergen Reactions  . Amoxicillin-Pot Clavulanate Other (See Comments)    Unknown reaction Has patient had a PCN reaction causing immediate rash, facial/tongue/throat swelling, SOB or lightheadedness with hypotension: Unknown Has patient had a PCN reaction causing severe rash involving mucus membranes or skin necrosis: Unknown Has patient had a PCN reaction that required hospitalization: Unknown Has patient had a PCN reaction occurring within the last 10 years: No If all of the above answers are "NO", then may proceed with Cephalosporin use.   . Codeine Hives  . Hydrocodone Nausea Only  . Metronidazole Nausea Only  . Propoxyphene Nausea Only  . Latex Rash   DISCHARGE MEDICATIONS:   Allergies as of 11/28/2020      Reactions   Amoxicillin-pot Clavulanate Other (See Comments)   Unknown reaction Has patient had a PCN reaction causing immediate rash, facial/tongue/throat swelling, SOB or lightheadedness with hypotension: Unknown Has patient had a PCN reaction causing severe rash involving mucus membranes or skin necrosis: Unknown Has patient had a PCN reaction that required hospitalization: Unknown Has patient had a PCN reaction occurring within the last 10 years: No If all of the above answers are "NO", then may proceed with Cephalosporin use.   Codeine Hives   Hydrocodone Nausea Only   Metronidazole Nausea Only   Propoxyphene Nausea Only   Latex  Rash      Medication List    STOP taking these medications   docusate sodium 100 MG capsule Commonly known as: Colace     TAKE these medications   acetaminophen 500 MG tablet Commonly known as: TYLENOL Take 500 mg by mouth daily as needed for headache.   ALPRAZolam 0.25 MG tablet Commonly known as: XANAX Take 0.25 mg by mouth at bedtime as needed for sleep.   ascorbic  acid 1000 MG tablet Commonly known as: VITAMIN C Take 1,000 mg by mouth daily.   aspirin 81 MG tablet Take 81 mg by mouth daily.   cloNIDine 0.1 MG tablet Commonly known as: CATAPRES Take 0.1 mg by mouth at bedtime.   felodipine 2.5 MG 24 hr tablet Commonly known as: PLENDIL Take 2.5 mg by mouth daily. May take a second 2.5 mg dose as needed if bp is over 150/90   fexofenadine 180 MG tablet Commonly known as: ALLEGRA Take 180 mg by mouth daily as needed for allergies.   gabapentin 300 MG capsule Commonly known as: NEURONTIN Take 1 capsule (300 mg total) by mouth 3 (three) times daily. What changed:   medication strength  how much to take  when to take this   hydrochlorothiazide 12.5 MG tablet Commonly known as: HYDRODIURIL Take 1 tablet by mouth daily.   isosorbide mononitrate 60 MG 24 hr tablet Commonly known as: IMDUR Take 60 mg by mouth daily.   losartan 100 MG tablet Commonly known as: COZAAR Take 1 tablet by mouth daily.   metoprolol tartrate 100 MG tablet Commonly known as: LOPRESSOR Take 100 mg by mouth 2 (two) times daily.   mirabegron ER 25 MG Tb24 tablet Commonly known as: MYRBETRIQ Take by mouth.   oxyCODONE-acetaminophen 5-325 MG tablet Commonly known as: Percocet Take 1 tablet by mouth every 4 (four) hours as needed for up to 3 days for severe pain.   pantoprazole 40 MG tablet Commonly known as: Protonix Take 1 tablet (40 mg total) by mouth 2 (two) times daily before a meal.   Premarin vaginal cream Generic drug: conjugated estrogens Apply one pea-sized amount around the opening of the urethra three times weekly.   valACYclovir 1000 MG tablet Commonly known as: Valtrex Take 1 tablet (1,000 mg total) by mouth 3 (three) times daily.   Vitamin D3 1.25 MG (50000 UT) Caps Take by mouth.   vitamin E 180 MG (400 UNITS) capsule Take 400 Units by mouth daily.      DISCHARGE INSTRUCTIONS:   DIET:  Cardiac diet DISCHARGE CONDITION:   Stable ACTIVITY:  Activity as tolerated OXYGEN:  Home Oxygen: No.  Oxygen Delivery: room air DISCHARGE LOCATION:  home   If you experience worsening of your admission symptoms, develop shortness of breath, life threatening emergency, suicidal or homicidal thoughts you must seek medical attention immediately by calling 911 or calling your MD immediately  if symptoms less severe.  You Must read complete instructions/literature along with all the possible adverse reactions/side effects for all the Medicines you take and that have been prescribed to you. Take any new Medicines after you have completely understood and accpet all the possible adverse reactions/side effects.   Please note  You were cared for by a hospitalist during your hospital stay. If you have any questions about your discharge medications or the care you received while you were in the hospital after you are discharged, you can call the unit and asked to speak with the hospitalist on call if the  hospitalist that took care of you is not available. Once you are discharged, your primary care physician will handle any further medical issues. Please note that NO REFILLS for any discharge medications will be authorized once you are discharged, as it is imperative that you return to your primary care physician (or establish a relationship with a primary care physician if you do not have one) for your aftercare needs so that they can reassess your need for medications and monitor your lab values.    On the day of Discharge:  VITAL SIGNS:  Blood pressure (!) 151/46, pulse 84, temperature 97.6 F (36.4 C), temperature source Temporal, resp. rate 12, height 5\' 7"  (1.702 m), weight 60.8 kg, SpO2 96 %. PHYSICAL EXAMINATION:  GENERAL:  87 y.o.-year-old patient lying in the bed with no acute distress.  EYES: Pupils equal, round, reactive to light and accommodation. No scleral icterus. Extraocular muscles intact.  HEENT: Head atraumatic,  normocephalic. Oropharynx and nasopharynx clear.  NECK:  Supple, no jugular venous distention. No thyroid enlargement, no tenderness.  LUNGS: Normal breath sounds bilaterally, no wheezing, rales,rhonchi or crepitation. No use of accessory muscles of respiration.  CARDIOVASCULAR: S1, S2 normal. No murmurs, rubs, or gallops.  ABDOMEN: Soft, non-tender, non-distended. Bowel sounds present. No organomegaly or mass.  EXTREMITIES: No pedal edema, cyanosis, or clubbing.  NEUROLOGIC: Cranial nerves II through XII are intact. Muscle strength 5/5 in all extremities. Sensation intact. Gait not checked.  PSYCHIATRIC: The patient is alert and oriented x 3.  SKIN: No obvious rash, lesion, or ulcer.  DATA REVIEW:   CBC Recent Labs  Lab 11/28/20 0447  WBC 7.5  HGB 11.5*  HCT 34.5*  PLT 183    Chemistries  Recent Labs  Lab 11/26/20 1417 11/27/20 0415 11/28/20 0447  NA 139 136 132*  K 3.1* 3.2* 3.8  CL 101 97* 100  CO2 29 27 25   GLUCOSE 122* 133* 105*  BUN 13 8 17   CREATININE 0.93 0.70 1.27*  CALCIUM 10.0 10.1 10.0  MG  --  1.3*  --   AST 18  --   --   ALT 9  --   --   ALKPHOS 51  --   --   BILITOT 0.6  --   --      Outpatient follow-up  Follow-up Information    Yolonda Kida, MD. Go on 12/06/2020.   Specialties: Cardiology, Internal Medicine Why: 9:45 Contact information: [place] [address] [phone]        Efrain Sella, MD. Go on 11/30/2020.   Specialty: Gastroenterology Why: [place] Plan information: [place] [address] [phone]        Idelle Crouch, MD. Go on 12/05/2020.   Specialty: Internal Medicine Why: 9:15 Contact information: [place] [address] [phone]                  Management plans discussed with the patient, family and they are in agreement.  CODE STATUS: Prior   TOTAL TIME TAKING CARE OF THIS PATIENT: 45 minutes.     Max Sane M.D on 11/29/2020 at 6:14 PM  [facility]   CC: Primary care physician; Idelle Crouch, MD   Note: This dictation was prepared with Dragon dictation along with smaller phrase technology. Any transcriptional errors that result from this process are unintentional.

## 2020-11-30 NOTE — Anesthesia Postprocedure Evaluation (Signed)
Anesthesia Post Note  Patient: Dawn Mcintyre  Procedure(s) Performed: ESOPHAGOGASTRODUODENOSCOPY (EGD) WITH PROPOFOL (N/A )  Patient location during evaluation: PACU Anesthesia Type: General Level of consciousness: awake and alert Pain management: pain level controlled Vital Signs Assessment: post-procedure vital signs reviewed and stable Respiratory status: spontaneous breathing, nonlabored ventilation, respiratory function stable and patient connected to nasal cannula oxygen Cardiovascular status: blood pressure returned to baseline and stable Postop Assessment: no apparent nausea or vomiting Anesthetic complications: no   No complications documented.   Last Vitals:  Vitals:   11/28/20 1257 11/28/20 1258  BP: (!) 151/46   Pulse:  84  Resp:  12  Temp:    SpO2:  96%    Last Pain:  Vitals:   11/28/20 1248  TempSrc:   PainSc: [name]

## 2020-12-01 ENCOUNTER — Telehealth: Payer: Self-pay | Admitting: Primary Care

## 2020-12-01 NOTE — Telephone Encounter (Signed)
Spoke with patient regarding Palliative referral/services and all questions were answered and she was in agreement with scheduling visit.  I have scheduled an In-home Consult for 12/04/20 @ 11AM.

## 2020-12-04 ENCOUNTER — Other Ambulatory Visit: Payer: Medicare Other | Admitting: Primary Care

## 2020-12-04 ENCOUNTER — Other Ambulatory Visit: Payer: Self-pay

## 2020-12-04 DIAGNOSIS — Z515 Encounter for palliative care: Secondary | ICD-10-CM

## 2020-12-04 DIAGNOSIS — M150 Primary generalized (osteo)arthritis: Secondary | ICD-10-CM

## 2020-12-04 DIAGNOSIS — B0229 Other postherpetic nervous system involvement: Secondary | ICD-10-CM

## 2020-12-04 DIAGNOSIS — R269 Unspecified abnormalities of gait and mobility: Secondary | ICD-10-CM | POA: Insufficient documentation

## 2020-12-04 DIAGNOSIS — M159 Polyosteoarthritis, unspecified: Secondary | ICD-10-CM

## 2020-12-04 DIAGNOSIS — M8949 Other hypertrophic osteoarthropathy, multiple sites: Secondary | ICD-10-CM

## 2020-12-04 DIAGNOSIS — R1032 Left lower quadrant pain: Secondary | ICD-10-CM

## 2020-12-04 NOTE — Progress Notes (Signed)
Therapist, nutritional Palliative Care Consult Note Telephone: [phone]  Fax: [phone]    Date of encounter: 12/04/20 PATIENT NAME: Dawn Mcintyre [address] [place] [address]   [phone] (home)  DOB: [birth year] MRN: [id number] PRIMARY CARE PROVIDER:    Marguarite Arbour, MD,  [address] [facility] [place] [address] [phone]  REFERRING PROVIDER:   Marguarite Arbour, MD [address] [facility] [place],  [place] [address] [phone]  RESPONSIBLE PARTY:    Contact Information    Name Relation Home Work Mobile   Poehlman,Timothy Son [phone]     Drenda Freeze Sister [phone]     Marissa Calamity   [phone]       I met face to face with patient in her  home. Palliative Care was asked to follow this patient by consultation request of  Sparks, Duane Lope, MD to address advance care planning and complex medical decision making. This is the initial visit.                                     ASSESSMENT AND PLAN / RECOMMENDATIONS:   Advance Care Planning/Goals of Care: Goals include to maximize quality of life and symptom management. Our advance care planning conversation included a discussion about:     The value and importance of advance care planning   Experiences with loved ones who have been seriously ill or have died   Exploration of personal, cultural or spiritual beliefs that might influence medical decisions   Exploration of goals of care in the event of a sudden injury or illness    Identification and preparation of a healthcare agent   Review  of an  advance directive document. CODE STATUS: FULL, MOST form given. She will discuss with son.  Symptom Management/Plan:  Mobility and fatigue d/t medication, deconditioning post hospital say for GI c/o: Endorses  Fatigue, may be due to recent shingles and gabapentin 300 mg tid.  She also has had recent gi pain and melana.  We discussed Home health. I think she would benefit from PT/OT and aide for bathing help. I have reached out to find home health provider as services are limited. If accepted, will ask PCP for orders. She is a fall risk due to abnormal gait.  Pain d/t PHN: Taking gabapentin 300 mg tid. This is due to shingle infection. She does also endorse fatigue. She is taking acetaminophen CR 1300 mg q 8 hrs. I suggested x 2-4 weeks and then decrease to one tid.   MOOD: Endorses frustration at being weaker as she is an active person who usually helps others. We discussed her asking for help from friends and doing things to increase her stamina. She endorses fatigue, see above.   Follow up Palliative Care Visit: Palliative care will continue to follow for complex medical decision making, advance care planning, and clarification of goals. Return 6 weeks or prn.  I spent 60 minutes providing this consultation. More than 50% of the time in this consultation was spent in counseling and care coordination.  PPS: 50%  HOSPICE ELIGIBILITY/DIAGNOSIS: TBD  Chief Complaint: fatigue  HISTORY OF PRESENT ILLNESS:  Dawn Mcintyre is a 87 y.o. year old female  with recent hospital stay for LLQ pain, diarrhea, recent zoster infection and PHN, debility and deconditioning .   History obtained from review of EMR,  discussion with primary team, and interview with family, facility staff/caregiver and/or [name].  I reviewed available labs, medications, imaging, studies and related documents from the EMR.  Records reviewed and summarized above.   ROS  General: NAD EYES: denies vision changes, wears glasses ENMT: denies dysphagia Cardiovascular: denies chest pain, denies DOE Pulmonary: denies cough, denies increased SOB Abdomen: endorses fair  appetite, denies constipation, endorses continence of bowel GU: denies dysuria, endorses continence of urine MSK:  Endorses weakness,  no falls reported Skin:L abdominal zoster  lesions Neurological: endorses neuralgia/pain, denies insomnia Psych: Endorses positive mood Heme/lymph/immuno: denies bruises, abnormal bleeding  Physical Exam: Current and past weights: 134 lbs Constitutional: NAD General: frail appearing, thin  EYES: anicteric sclera, lids intact, no discharge  ENMT: intact hearing, oral mucous membranes moist, dentition intact CV:  no LE edema Pulmonary:  no increased work of breathing, no cough, room air Abdomen: intake 75%,slight ascites MSK: moderate sarcopenia, moves all extremities, ambulatory without device Skin: warm and dry, no rashes or wounds on visible skin Neuro:  + generalized weakness,  no cognitive impairment Psych: slightly anxious affect, A and O x 3 Hem/lymph/immuno: no widespread bruising   CURRENT PROBLEM LIST:  Patient Active Problem List   Diagnosis Date Noted  . Melena   . LLQ abdominal pain   . Post herpetic neuralgia   . Abdominal pain 11/26/2020  . Greater trochanteric bursitis of right hip 02/02/2019  . Diabetic neuropathy ([place]) 12/17/2018  . PAD (peripheral artery disease) ([place]) 10/05/2018  . Urinary incontinence in female 09/08/2018  . Carotid stenosis, bilateral 12/24/2017  . Syncope and collapse 11/15/2017  . CAD (coronary artery disease) 11/15/2017  . Leg pain 10/26/2017  . Bilateral carotid artery disease ([place]) 07/11/2016  . Primary osteoarthritis involving multiple joints 07/11/2016  . Pure hypercholesterolemia 07/11/2016  . Carotid stenosis 07/01/2016  . Hyperlipidemia 07/01/2016  . Peripheral sensory neuropathy ([place]) 03/25/2016  . Type 2 diabetes mellitus with stage 3 chronic kidney disease, without long-term current use of insulin ([place]) 03/25/2016  . Anxiety 11/24/2015  . Sensory peripheral neuropathy ([place]) 10/30/2015  . Influenza 10/15/2015  . CKD (chronic kidney disease) stage 3, GFR 30-59 ml/min (HCC) 07/19/2015  . Osteopenia 07/19/2015  . Chronic insomnia 01/25/2015  . Diet-controlled type  2 diabetes mellitus ([place]) 01/25/2015  . Gastroesophageal reflux disease without esophagitis 01/25/2015  . Mixed stress and urge urinary incontinence 01/25/2015  . Atrophic vaginitis 11/16/2014  . Vitamin B 12 deficiency 10/26/2014  . Abdominal pain, chronic, epigastric 06/15/2014  . Essential hypertension 04/28/2014  . Sciatica of left side 04/07/2014  . HTN (hypertension) 01/28/2014  . Type 2 diabetes mellitus ([place]) 01/28/2014   PAST MEDICAL HISTORY:  Active Ambulatory Problems    Diagnosis Date Noted  . Atrophic vaginitis 11/16/2014  . Influenza 10/15/2015  . Abdominal pain, chronic, epigastric 06/15/2014  . Anxiety 11/24/2015  . Chronic insomnia 01/25/2015  . CKD (chronic kidney disease) stage 3, GFR 30-59 ml/min (HCC) 07/19/2015  . Diet-controlled type 2 diabetes mellitus ([place]) 01/25/2015  . Essential hypertension 04/28/2014  . Gastroesophageal reflux disease without esophagitis 01/25/2015  . HTN (hypertension) 01/28/2014  . Mixed stress and urge urinary incontinence 01/25/2015  . Osteopenia 07/19/2015  . Peripheral sensory neuropathy ([place]) 03/25/2016  . Sciatica of left side 04/07/2014  . Sensory peripheral neuropathy ([place]) 10/30/2015  . Type 2 diabetes mellitus ([place]) 01/28/2014  . Type 2 diabetes mellitus with stage 3 chronic kidney disease, without long-term current use of insulin ([place]) 03/25/2016  . Vitamin  B 12 deficiency 10/26/2014  . Carotid stenosis 07/01/2016  . Hyperlipidemia 07/01/2016  . Leg pain 10/26/2017  . Syncope and collapse 11/15/2017  . CAD (coronary artery disease) 11/15/2017  . Carotid stenosis, bilateral 12/24/2017  . Bilateral carotid artery disease ([place]) 07/11/2016  . PAD (peripheral artery disease) ([place]) 10/05/2018  . Diabetic neuropathy ([place]) 12/17/2018  . Greater trochanteric bursitis of right hip 02/02/2019  . Primary osteoarthritis involving multiple joints 07/11/2016  . Pure hypercholesterolemia 07/11/2016  . Urinary incontinence in  female 09/08/2018  . Abdominal pain 11/26/2020  . Melena   . LLQ abdominal pain   . Post herpetic neuralgia    Resolved Ambulatory Problems    Diagnosis Date Noted  . Pelvic pain in female 04/11/2011   Past Medical History:  Diagnosis Date  . Arteritis ([place])   . Arthritis   . Carotid artery stenosis   . Cataract   . Difficult intubation   . Environmental and seasonal allergies   . GERD (gastroesophageal reflux disease)   . Hypertension   . Lack of bladder control   . Neuropathy   . Osteoporosis   . Pneumonia   . Pre-diabetes   . Shingles   . Ulcer    SOCIAL HX:  Social History   Tobacco Use  . Smoking status: Never Smoker  . Smokeless tobacco: Never Used  Substance Use Topics  . Alcohol use: No    Alcohol/week: 0.0 standard drinks   FAMILY HX:  Family History  Problem Relation Age of Onset  . Anuerysm Mother   . Cancer Father        prostrate cancer  . Diabetes Brother   . Hearing loss Brother   . Bladder Cancer Neg Hx   . Kidney cancer Neg Hx       ALLERGIES:  Allergies  Allergen Reactions  . Amoxicillin-Pot Clavulanate Other (See Comments)    Unknown reaction Has patient had a PCN reaction causing immediate rash, facial/tongue/throat swelling, SOB or lightheadedness with hypotension: Unknown Has patient had a PCN reaction causing severe rash involving mucus membranes or skin necrosis: Unknown Has patient had a PCN reaction that required hospitalization: Unknown Has patient had a PCN reaction occurring within the last 10 years: No If all of the above answers are "NO", then may proceed with Cephalosporin use.   . Codeine Hives  . Hydrocodone Nausea Only  . Metronidazole Nausea Only  . Propoxyphene Nausea Only  . Latex Rash     PERTINENT MEDICATIONS:  Outpatient Encounter Medications as of 12/04/2020  Medication Sig  . gabapentin (NEURONTIN) 300 MG capsule Take 1 capsule (300 mg total) by mouth 3 (three) times daily.  . valACYclovir (VALTREX) 1000  MG tablet Take 1 tablet (1,000 mg total) by mouth 3 (three) times daily.  Marland Kitchen acetaminophen (TYLENOL) 500 MG tablet Take 500 mg by mouth daily as needed for headache.  . ALPRAZolam (XANAX) 0.25 MG tablet Take 0.25 mg by mouth at bedtime as needed for sleep.  Marland Kitchen ascorbic acid (VITAMIN C) 1000 MG tablet Take 1,000 mg by mouth daily.  Marland Kitchen aspirin 81 MG tablet Take 81 mg by mouth daily.  . Cholecalciferol (VITAMIN D3) 1.25 MG (50000 UT) CAPS Take by mouth.  . cloNIDine (CATAPRES) 0.1 MG tablet Take 0.1 mg by mouth at bedtime.  . conjugated estrogens (PREMARIN) vaginal cream Apply one pea-sized amount around the opening of the urethra three times weekly.  . felodipine (PLENDIL) 2.5 MG 24 hr tablet Take 2.5 mg by mouth daily.  May take a second 2.5 mg dose as needed if bp is over 150/90  . fexofenadine (ALLEGRA) 180 MG tablet Take 180 mg by mouth daily as needed for allergies.   . hydrochlorothiazide (HYDRODIURIL) 12.5 MG tablet Take 1 tablet by mouth daily.  . isosorbide mononitrate (IMDUR) 60 MG 24 hr tablet Take 60 mg by mouth daily.  Marland Kitchen losartan (COZAAR) 100 MG tablet Take 1 tablet by mouth daily.  . metoprolol tartrate (LOPRESSOR) 100 MG tablet Take 100 mg by mouth 2 (two) times daily.  . mirabegron ER (MYRBETRIQ) 25 MG TB24 tablet Take by mouth.  . pantoprazole (PROTONIX) 40 MG tablet Take 1 tablet (40 mg total) by mouth 2 (two) times daily before a meal.  . vitamin E 180 MG (400 UNITS) capsule Take 400 Units by mouth daily.   No facility-administered encounter medications on file as of 12/04/2020.    Thank you for the opportunity to participate in the care of Ms. Renbarger.  The palliative care team will continue to follow. Please call our office at [phone] if we can be of additional assistance.   Jason Coop, NP , DNP, MPH, AGPCNP-BC, ACHPN  COVID-19 PATIENT SCREENING TOOL Asked and negative response unless otherwise noted:   Have you had symptoms of covid, tested positive or been  in contact with someone with symptoms/positive test in the past 5-10 days?

## 2020-12-05 ENCOUNTER — Ambulatory Visit: Payer: Medicare Other

## 2020-12-06 ENCOUNTER — Other Ambulatory Visit: Payer: Self-pay | Admitting: Physician Assistant

## 2020-12-06 ENCOUNTER — Ambulatory Visit
Admission: RE | Admit: 2020-12-06 | Discharge: 2020-12-06 | Disposition: A | Discharge status: Home / Self Care | Payer: Medicare Other | Source: Ambulatory Visit | Attending: Physician Assistant | Admitting: Physician Assistant

## 2020-12-06 ENCOUNTER — Other Ambulatory Visit: Payer: Self-pay

## 2020-12-06 DIAGNOSIS — W19XXXA Unspecified fall, initial encounter: Secondary | ICD-10-CM | POA: Diagnosis not present

## 2020-12-06 DIAGNOSIS — Y939 Activity, unspecified: Secondary | ICD-10-CM | POA: Diagnosis not present

## 2020-12-06 DIAGNOSIS — S0990XA Unspecified injury of head, initial encounter: Secondary | ICD-10-CM | POA: Diagnosis not present

## 2020-12-06 DIAGNOSIS — Y92009 Unspecified place in unspecified non-institutional (private) residence as the place of occurrence of the external cause: Secondary | ICD-10-CM | POA: Diagnosis not present

## 2020-12-06 DIAGNOSIS — Y929 Unspecified place or not applicable: Secondary | ICD-10-CM | POA: Insufficient documentation

## 2020-12-14 ENCOUNTER — Other Ambulatory Visit: Payer: Self-pay

## 2020-12-14 ENCOUNTER — Other Ambulatory Visit (HOSPITAL_COMMUNITY)
Admission: RE | Admit: 2020-12-14 | Discharge: 2020-12-14 | Disposition: A | Discharge status: Home / Self Care | Payer: Medicare Other | Source: Ambulatory Visit | Attending: Family Medicine | Admitting: Family Medicine

## 2020-12-14 ENCOUNTER — Encounter: Payer: Self-pay | Admitting: Family Medicine

## 2020-12-14 ENCOUNTER — Ambulatory Visit: Payer: Medicare Other | Admitting: Family Medicine

## 2020-12-14 VITALS — BP 131/73 | HR 62

## 2020-12-14 DIAGNOSIS — N760 Acute vaginitis: Secondary | ICD-10-CM | POA: Diagnosis not present

## 2020-12-14 NOTE — Progress Notes (Signed)
Pain on inside of vagina

## 2020-12-14 NOTE — Progress Notes (Signed)
    Subjective:    Patient ID: Dawn Mcintyre is a 87 y.o. female presenting with Vaginitis  on 12/14/2020  HPI: Has h/o recurrent yeast/BV infections. On boric acid for a while. Acute change in discharge and irritation. Not using her boric acid lately.  Review of Systems  Constitutional: Negative for chills and fever.  Respiratory: Negative for shortness of breath.   Cardiovascular: Negative for chest pain.  Gastrointestinal: Negative for abdominal pain, nausea and vomiting.  Genitourinary: Negative for dysuria.  Skin: Negative for rash.      Objective:    BP 131/73   Pulse 62  Physical Exam Constitutional:      General: She is not in acute distress.    Appearance: She is well-developed.  HENT:     Head: Normocephalic and atraumatic.  Eyes:     General: No scleral icterus. Cardiovascular:     Rate and Rhythm: Normal rate.  Pulmonary:     Effort: Pulmonary effort is normal.  Abdominal:     Palpations: Abdomen is soft.  Genitourinary:    Comments: Atrophic vagina, no erythema Musculoskeletal:     Cervical back: Neck supple.  Skin:    General: Skin is warm and dry.  Neurological:     Mental Status: She is alert and oriented to person, place, and time.         Assessment & Plan:  Acute vaginitis - H/o recurrent BV--to use her Boric acid--buy from [facility] for cheaper alternative. Check wet prep and tx accordingly - Plan: Cervicovaginal ancillary only   Return if symptoms worsen or fail to improve.  Dawn Mcintyre 12/14/2020 5:31 PM

## 2020-12-15 ENCOUNTER — Telehealth: Payer: Self-pay | Admitting: *Deleted

## 2020-12-15 MED ORDER — PHENAZOPYRIDINE HCL 200 MG PO TABS: 200.0000 mg | ORAL_TABLET | Freq: Three times a day (TID) | ORAL | 1 refills | Status: DC | PRN

## 2020-12-15 MED ORDER — NITROFURANTOIN MONOHYD MACRO 100 MG PO CAPS: 100.0000 mg | ORAL_CAPSULE | Freq: Two times a day (BID) | ORAL | 0 refills | Status: DC

## 2020-12-15 NOTE — Telephone Encounter (Signed)
Pt called stating she tried using the boric acid capsule last night and had terrible discomfort and removed it. She also has had pain and burning with urination and frequency as well. Informed pt swab results will still be a few days, but I will ask provider if we can treat for UTI. Okay per Dr Kennon Rounds to treat. Pt informed.

## 2020-12-16 ENCOUNTER — Telehealth: Payer: Medicare Other | Admitting: Nurse Practitioner

## 2020-12-16 DIAGNOSIS — N3000 Acute cystitis without hematuria: Secondary | ICD-10-CM

## 2020-12-16 MED ORDER — SULFAMETHOXAZOLE-TRIMETHOPRIM 800-160 MG PO TABS: 1.0000 | ORAL_TABLET | Freq: Two times a day (BID) | ORAL | 0 refills | Status: DC

## 2020-12-16 NOTE — Progress Notes (Signed)

## 2020-12-18 LAB — CERVICOVAGINAL ANCILLARY ONLY
Bacterial Vaginitis (gardnerella): NEGATIVE
Candida Glabrata: NEGATIVE
Candida Vaginitis: NEGATIVE
Comment: NEGATIVE
Comment: NEGATIVE
Comment: NEGATIVE

## 2020-12-19 ENCOUNTER — Inpatient Hospital Stay
Admission: EM | Admit: 2020-12-19 | Discharge: 2020-12-21 | DRG: 392 | Disposition: A | Discharge status: Home Health | Payer: Medicare Other | Attending: Internal Medicine | Admitting: Internal Medicine

## 2020-12-19 ENCOUNTER — Other Ambulatory Visit: Payer: Self-pay

## 2020-12-19 ENCOUNTER — Ambulatory Visit: Payer: Medicare Other

## 2020-12-19 ENCOUNTER — Emergency Department: Payer: Medicare Other

## 2020-12-19 DIAGNOSIS — E1151 Type 2 diabetes mellitus with diabetic peripheral angiopathy without gangrene: Secondary | ICD-10-CM | POA: Diagnosis present

## 2020-12-19 DIAGNOSIS — I2510 Atherosclerotic heart disease of native coronary artery without angina pectoris: Secondary | ICD-10-CM | POA: Diagnosis present

## 2020-12-19 DIAGNOSIS — Z91040 Latex allergy status: Secondary | ICD-10-CM

## 2020-12-19 DIAGNOSIS — N183 Chronic kidney disease, stage 3 unspecified: Secondary | ICD-10-CM | POA: Diagnosis present

## 2020-12-19 DIAGNOSIS — R1030 Lower abdominal pain, unspecified: Secondary | ICD-10-CM | POA: Diagnosis not present

## 2020-12-19 DIAGNOSIS — M810 Age-related osteoporosis without current pathological fracture: Secondary | ICD-10-CM | POA: Diagnosis present

## 2020-12-19 DIAGNOSIS — Z885 Allergy status to narcotic agent status: Secondary | ICD-10-CM

## 2020-12-19 DIAGNOSIS — Z880 Allergy status to penicillin: Secondary | ICD-10-CM

## 2020-12-19 DIAGNOSIS — E876 Hypokalemia: Secondary | ICD-10-CM | POA: Diagnosis present

## 2020-12-19 DIAGNOSIS — K449 Diaphragmatic hernia without obstruction or gangrene: Secondary | ICD-10-CM | POA: Diagnosis present

## 2020-12-19 DIAGNOSIS — I10 Essential (primary) hypertension: Secondary | ICD-10-CM | POA: Diagnosis not present

## 2020-12-19 DIAGNOSIS — K5750 Diverticulosis of both small and large intestine without perforation or abscess without bleeding: Secondary | ICD-10-CM | POA: Diagnosis present

## 2020-12-19 DIAGNOSIS — I700 Atherosclerosis of aorta: Secondary | ICD-10-CM | POA: Diagnosis present

## 2020-12-19 DIAGNOSIS — R109 Unspecified abdominal pain: Secondary | ICD-10-CM | POA: Diagnosis not present

## 2020-12-19 DIAGNOSIS — N390 Urinary tract infection, site not specified: Secondary | ICD-10-CM | POA: Diagnosis present

## 2020-12-19 DIAGNOSIS — A09 Infectious gastroenteritis and colitis, unspecified: Secondary | ICD-10-CM | POA: Diagnosis present

## 2020-12-19 DIAGNOSIS — E785 Hyperlipidemia, unspecified: Secondary | ICD-10-CM | POA: Diagnosis present

## 2020-12-19 DIAGNOSIS — I129 Hypertensive chronic kidney disease with stage 1 through stage 4 chronic kidney disease, or unspecified chronic kidney disease: Secondary | ICD-10-CM | POA: Diagnosis present

## 2020-12-19 DIAGNOSIS — K219 Gastro-esophageal reflux disease without esophagitis: Secondary | ICD-10-CM | POA: Diagnosis present

## 2020-12-19 DIAGNOSIS — F419 Anxiety disorder, unspecified: Secondary | ICD-10-CM | POA: Diagnosis present

## 2020-12-19 DIAGNOSIS — E1122 Type 2 diabetes mellitus with diabetic chronic kidney disease: Secondary | ICD-10-CM | POA: Diagnosis present

## 2020-12-19 DIAGNOSIS — K6389 Other specified diseases of intestine: Secondary | ICD-10-CM | POA: Diagnosis present

## 2020-12-19 DIAGNOSIS — R6510 Systemic inflammatory response syndrome (SIRS) of non-infectious origin without acute organ dysfunction: Secondary | ICD-10-CM | POA: Diagnosis present

## 2020-12-19 DIAGNOSIS — Z20822 Contact with and (suspected) exposure to covid-19: Secondary | ICD-10-CM | POA: Diagnosis present

## 2020-12-19 DIAGNOSIS — R1032 Left lower quadrant pain: Secondary | ICD-10-CM

## 2020-12-19 DIAGNOSIS — N1831 Chronic kidney disease, stage 3a: Secondary | ICD-10-CM | POA: Diagnosis present

## 2020-12-19 DIAGNOSIS — Z888 Allergy status to other drugs, medicaments and biological substances status: Secondary | ICD-10-CM

## 2020-12-19 DIAGNOSIS — K638219 Small intestinal bacterial overgrowth, unspecified: Secondary | ICD-10-CM | POA: Diagnosis present

## 2020-12-19 DIAGNOSIS — Z833 Family history of diabetes mellitus: Secondary | ICD-10-CM

## 2020-12-19 LAB — COMPREHENSIVE METABOLIC PANEL
ALT: 7 U/L (ref 0–44)
AST: 19 U/L (ref 15–41)
Albumin: 4.1 g/dL (ref 3.5–5.0)
Alkaline Phosphatase: 60 U/L (ref 38–126)
Anion gap: 11 (ref 5–15)
BUN: 12 mg/dL (ref 8–23)
CO2: 25 mmol/L (ref 22–32)
Calcium: 9.8 mg/dL (ref 8.9–10.3)
Chloride: 102 mmol/L (ref 98–111)
Creatinine, Ser: 0.99 mg/dL (ref 0.44–1.00)
GFR, Estimated: 56 mL/min — ABNORMAL LOW (ref >60)
Glucose, Bld: 137 mg/dL — ABNORMAL HIGH (ref 70–99)
Potassium: 2.6 mmol/L — CL (ref 3.5–5.1)
Sodium: 138 mmol/L (ref 135–145)
Total Bilirubin: 0.9 mg/dL (ref 0.3–1.2)
Total Protein: 6.8 g/dL (ref 6.5–8.1)

## 2020-12-19 LAB — URINALYSIS, COMPLETE (UACMP) WITH MICROSCOPIC
Bilirubin Urine: NEGATIVE
Glucose, UA: NEGATIVE mg/dL
Hgb urine dipstick: NEGATIVE
Ketones, ur: 5 mg/dL — AB
Leukocytes,Ua: NEGATIVE
Nitrite: POSITIVE — AB
Protein, ur: NEGATIVE mg/dL
Specific Gravity, Urine: 1.008 (ref 1.005–1.030)
pH: 6 (ref 5.0–8.0)

## 2020-12-19 LAB — GLUCOSE, CAPILLARY
Glucose-Capillary: 103 mg/dL — ABNORMAL HIGH (ref 70–99)
Glucose-Capillary: 101 mg/dL — ABNORMAL HIGH (ref 70–99)

## 2020-12-19 LAB — CBC
HCT: 37.2 % (ref 36.0–46.0)
Hemoglobin: 12.5 g/dL (ref 12.0–15.0)
MCH: 32.4 pg (ref 26.0–34.0)
MCHC: 33.6 g/dL (ref 30.0–36.0)
MCV: 96.4 fL (ref 80.0–100.0)
Platelets: 234 10*3/uL (ref 150–400)
RBC: 3.86 MIL/uL — ABNORMAL LOW (ref 3.87–5.11)
RDW: 13.9 % (ref 11.5–15.5)
WBC: 5.9 10*3/uL (ref 4.0–10.5)
nRBC: 0 % (ref 0.0–0.2)

## 2020-12-19 LAB — RESP PANEL BY RT-PCR (FLU A&B, COVID) ARPGX2
Influenza A by PCR: NEGATIVE
Influenza B by PCR: NEGATIVE
SARS Coronavirus 2 by RT PCR: NEGATIVE

## 2020-12-19 LAB — MAGNESIUM: Magnesium: 1.2 mg/dL — ABNORMAL LOW (ref 1.7–2.4)

## 2020-12-19 LAB — LIPASE, BLOOD: Lipase: 34 U/L (ref 11–51)

## 2020-12-19 MED ORDER — DICYCLOMINE HCL 10 MG PO CAPS
10.0000 mg | ORAL_CAPSULE | Freq: Three times a day (TID) | ORAL | Status: DC
  Administered 2020-12-19 – 2020-12-21 (×6): 10 mg via ORAL
  Filled 2020-12-19 (×11): qty 1

## 2020-12-19 MED ORDER — MAGNESIUM SULFATE 4 GM/100ML IV SOLN
4.0000 g | Freq: Once | INTRAVENOUS | Status: AC
  Administered 2020-12-19: 4 g via INTRAVENOUS
  Filled 2020-12-19: qty 100

## 2020-12-19 MED ORDER — GABAPENTIN 300 MG PO CAPS
300.0000 mg | ORAL_CAPSULE | Freq: Three times a day (TID) | ORAL | Status: DC
  Administered 2020-12-19 – 2020-12-21 (×5): 300 mg via ORAL
  Filled 2020-12-19 (×5): qty 1

## 2020-12-19 MED ORDER — MIRABEGRON ER 25 MG PO TB24
25.0000 mg | ORAL_TABLET | Freq: Every day | ORAL | Status: DC
  Administered 2020-12-20 – 2020-12-21 (×2): 25 mg via ORAL
  Filled 2020-12-19 (×3): qty 1

## 2020-12-19 MED ORDER — CLONIDINE HCL 0.1 MG PO TABS: 0.1000 mg | ORAL_TABLET | Freq: Every day | ORAL | Status: DC

## 2020-12-19 MED ORDER — VITAMIN D (ERGOCALCIFEROL) 1.25 MG (50000 UNIT) PO CAPS: 50000.0000 [IU] | ORAL_CAPSULE | ORAL | Status: DC

## 2020-12-19 MED ORDER — ONDANSETRON HCL 4 MG PO TABS: 4.0000 mg | ORAL_TABLET | Freq: Four times a day (QID) | ORAL | Status: DC | PRN

## 2020-12-19 MED ORDER — ASPIRIN EC 81 MG PO TBEC
81.0000 mg | DELAYED_RELEASE_TABLET | Freq: Every day | ORAL | Status: DC
  Administered 2020-12-20 – 2020-12-21 (×2): 81 mg via ORAL
  Filled 2020-12-19 (×2): qty 1

## 2020-12-19 MED ORDER — HYDROCODONE-ACETAMINOPHEN 5-325 MG PO TABS
1.0000 | ORAL_TABLET | ORAL | Status: DC | PRN
Start: 2020-12-19 — End: 2020-12-21
  Administered 2020-12-20: 1 via ORAL
  Filled 2020-12-19: qty 1

## 2020-12-19 MED ORDER — METOPROLOL TARTRATE 50 MG PO TABS
100.0000 mg | ORAL_TABLET | Freq: Two times a day (BID) | ORAL | Status: DC
  Administered 2020-12-19 – 2020-12-21 (×4): 100 mg via ORAL
  Filled 2020-12-19 (×4): qty 2

## 2020-12-19 MED ORDER — MORPHINE SULFATE (PF) 2 MG/ML IV SOLN
2.0000 mg | INTRAVENOUS | Status: DC | PRN
  Administered 2020-12-19 – 2020-12-20 (×2): 2 mg via INTRAVENOUS
  Filled 2020-12-19 (×2): qty 1

## 2020-12-19 MED ORDER — POTASSIUM CHLORIDE 10 MEQ/100ML IV SOLN
10.0000 meq | Freq: Once | INTRAVENOUS | Status: DC
  Administered 2020-12-19: 10 meq via INTRAVENOUS
  Filled 2020-12-19: qty 100

## 2020-12-19 MED ORDER — SODIUM CHLORIDE 0.9 % IV SOLN
1.0000 g | Freq: Once | INTRAVENOUS | Status: AC
  Administered 2020-12-19: 1 g via INTRAVENOUS
  Filled 2020-12-19: qty 10

## 2020-12-19 MED ORDER — ISOSORBIDE MONONITRATE ER 30 MG PO TB24
60.0000 mg | ORAL_TABLET | Freq: Every day | ORAL | Status: DC
  Administered 2020-12-19 – 2020-12-21 (×3): 60 mg via ORAL
  Filled 2020-12-19: qty 1
  Filled 2020-12-19 (×2): qty 2

## 2020-12-19 MED ORDER — ACETAMINOPHEN 325 MG PO TABS
650.0000 mg | ORAL_TABLET | Freq: Four times a day (QID) | ORAL | Status: DC | PRN
  Administered 2020-12-19: 650 mg via ORAL
  Filled 2020-12-19: qty 2

## 2020-12-19 MED ORDER — ONDANSETRON HCL 4 MG/2ML IJ SOLN
4.0000 mg | Freq: Once | INTRAMUSCULAR | Status: AC
  Administered 2020-12-19: 4 mg via INTRAVENOUS
  Filled 2020-12-19: qty 2

## 2020-12-19 MED ORDER — ASCORBIC ACID 500 MG PO TABS
1000.0000 mg | ORAL_TABLET | Freq: Every day | ORAL | Status: DC
  Administered 2020-12-20 – 2020-12-21 (×2): 1000 mg via ORAL
  Filled 2020-12-19 (×2): qty 2

## 2020-12-19 MED ORDER — LORATADINE 10 MG PO TABS
10.0000 mg | ORAL_TABLET | Freq: Every day | ORAL | Status: DC
  Administered 2020-12-20 – 2020-12-21 (×2): 10 mg via ORAL
  Filled 2020-12-19 (×2): qty 1

## 2020-12-19 MED ORDER — ALPRAZOLAM 0.5 MG PO TABS: 0.2500 mg | ORAL_TABLET | Freq: Every evening | ORAL | Status: DC | PRN

## 2020-12-19 MED ORDER — MORPHINE SULFATE (PF) 4 MG/ML IV SOLN
4.0000 mg | Freq: Once | INTRAVENOUS | Status: AC
  Administered 2020-12-19: 4 mg via INTRAVENOUS
  Filled 2020-12-19: qty 1

## 2020-12-19 MED ORDER — RIFAXIMIN 550 MG PO TABS
550.0000 mg | ORAL_TABLET | Freq: Two times a day (BID) | ORAL | Status: DC
  Administered 2020-12-19 – 2020-12-21 (×5): 550 mg via ORAL
  Filled 2020-12-19 (×7): qty 1

## 2020-12-19 MED ORDER — ENOXAPARIN SODIUM 40 MG/0.4ML IJ SOSY
40.0000 mg | PREFILLED_SYRINGE | INTRAMUSCULAR | Status: DC
  Administered 2020-12-19 – 2020-12-20 (×2): 40 mg via SUBCUTANEOUS
  Filled 2020-12-19 (×2): qty 0.4

## 2020-12-19 MED ORDER — POTASSIUM CHLORIDE 10 MEQ/100ML IV SOLN
10.0000 meq | Freq: Once | INTRAVENOUS | Status: AC
  Administered 2020-12-19: 10 meq via INTRAVENOUS
  Filled 2020-12-19: qty 100

## 2020-12-19 MED ORDER — SODIUM CHLORIDE 0.9 % IV BOLUS
500.0000 mL | Freq: Once | INTRAVENOUS | Status: AC
  Administered 2020-12-19: 500 mL via INTRAVENOUS

## 2020-12-19 MED ORDER — ONDANSETRON HCL 4 MG/2ML IJ SOLN: 4.0000 mg | Freq: Four times a day (QID) | INTRAMUSCULAR | Status: DC | PRN

## 2020-12-19 MED ORDER — SODIUM CHLORIDE 0.9 % IV SOLN
1.0000 g | INTRAVENOUS | Status: DC
  Administered 2020-12-20: 1 g via INTRAVENOUS
  Filled 2020-12-19 (×3): qty 10

## 2020-12-19 MED ORDER — PANTOPRAZOLE SODIUM 40 MG IV SOLR
40.0000 mg | INTRAVENOUS | Status: DC
  Administered 2020-12-19 – 2020-12-20 (×2): 40 mg via INTRAVENOUS
  Filled 2020-12-19 (×3): qty 40

## 2020-12-19 MED ORDER — LOSARTAN POTASSIUM 50 MG PO TABS
100.0000 mg | ORAL_TABLET | Freq: Every day | ORAL | Status: DC
  Administered 2020-12-19 – 2020-12-21 (×3): 100 mg via ORAL
  Filled 2020-12-19 (×3): qty 2

## 2020-12-19 MED ORDER — POTASSIUM CHLORIDE IN NACL 40-0.9 MEQ/L-% IV SOLN
INTRAVENOUS | Status: DC
  Filled 2020-12-19 (×6): qty 1000

## 2020-12-19 MED ORDER — ACETAMINOPHEN 650 MG RE SUPP: 650.0000 mg | Freq: Four times a day (QID) | RECTAL | Status: DC | PRN

## 2020-12-19 MED ORDER — ESTROGENS, CONJUGATED 0.625 MG/GM VA CREA
1.0000 | TOPICAL_CREAM | VAGINAL | Status: DC
  Administered 2020-12-20: 1 via VAGINAL
  Filled 2020-12-19: qty 30

## 2020-12-19 NOTE — ED Notes (Signed)
Lab advising potassium is 2.6, MD Siadecki made aware.

## 2020-12-19 NOTE — ED Triage Notes (Addendum)
Pt states "I have bacteria in my intestines", states she had an endoscope 2 weeks ago and they told her this. States she has been having constant 10/10 aching lower abdominal pain x3 weeks, states was prescribed medication for pain but has not been able to fill it, unable to state what medication this was. States pain is in lower abdomen and endorses pain with urination. Visitor states that the patient has one dose of Macrobid left for known UTI.  Pt denies diarrhea/constipation. Endorses vomiting x1 yesterday that was relieved with zofran, and bloating.

## 2020-12-19 NOTE — ED Notes (Signed)
Pt's visitor stating that the prescription she was not able fill was Xifaxan.

## 2020-12-19 NOTE — H&P (Signed)
History and Physical    Dawn Mcintyre ZCH:[id number] DOB: [birth year] DOA: 12/19/2020  PCP: Dawn Crouch, MD   Patient coming from: Home  I have personally briefly reviewed patient's old medical records in [place]  Chief Complaint: Abdominal pain  HPI: Dawn Mcintyre is a 87 y.o. female with medical history significant for recently diagnosed small intestine bacterial overgrowth, GERD, hypertension and dyslipidemia who presents to the ER for evaluation of abdominal pain.  Pain is mostly in the lower abdomen, rated 10 x 10 in intensity at its worst and is constant.  She had an endoscopy done 2 weeks ago and was told she had small intestine bacterial overgrowth.  She was prescribed rifaximin which she was unable to get from the pharmacy because her insurance will not pay for it.  Abdominal pain is associated with bloating, nausea, vomiting and poor oral intake.  She admits to some weight loss but is unable to tell me how much weight she has lost.  She also complains of weakness, frequency of urination and dysuria She denies having any chest pain, no shortness of breath, no dizziness, no lightheadedness, no fever, no chills, no cough, no headache, no constipation, no focal deficits. Labs show sodium 138, potassium 2.6, chloride 102, bicarb 25, glucose 137, BUN 12, creatinine 0.9, calcium 9.8, magnesium 1.2, alkaline phosphatase 60, albumin 4.1, lipase 34, AST 19, ALT 7, total protein 6.8, white count 5.9, hemoglobin 12.5, hematocrit 37.2, MCV 96.4, RDW 13.9, platelet count 234 Urine analysis shows positive nitrite and many bacteria. CT scan of abdomen and pelvis shows  no acute abnormality in the abdomen or pelvis to explain left lower quadrant pain. Unchanged descending and sigmoid colonic diverticula, no evidence of diverticulitis. Unchanged small hiatal hernia. Aortic Atherosclerosis  Twelve-lead EKG reviewed by me shows sinus rhythm with minimal ST depressions in the lateral  leads.  ED Course: Patient is an 87 year old female who presents to the ER for evaluation of pain mostly in the lower abdomen associated with nausea, vomiting, bloating and poor oral intake. She had an upper endoscopy which showed a small hiatal hernia patient states that she was also diagnosed with small intestine bacterial overgrowth for which she was prescribed Xifaxan. Patient was unable to get this medication from the pharmacy refused to pay for it. Labs show hypokalemia, hypomagnesemia she also has pyuria. She will be referred to observation status for further evaluation.   Review of Systems: As per HPI otherwise all other systems reviewed and negative.    Past Medical History:  Diagnosis Date  . Arteritis ([place])   . Arthritis   . Atrophic vaginitis 11/16/2014  . Carotid artery stenosis   . Cataract   . Difficult intubation   . Environmental and seasonal allergies   . GERD (gastroesophageal reflux disease)   . Hyperlipidemia   . Hypertension   . Lack of bladder control   . Neuropathy   . Osteoporosis   . Pneumonia     x 52yrs ago  . Pre-diabetes   . Shingles   . Ulcer     Past Surgical History:  Procedure Laterality Date  . ABDOMINAL HYSTERECTOMY  1972  . BREAST CYST EXCISION Right 1972  . BREAST CYST EXCISION Left 1972  . ENDARTERECTOMY Left 12/24/2017   Procedure: ENDARTERECTOMY CAROTID;  Surgeon: Katha Cabal, MD;  Location: [facility] OR[facility];  Service: Vascular;  Laterality: Left;  . ENDARTERECTOMY Right 03/13/2018   Procedure: ENDARTERECTOMY CAROTID;  Surgeon: Katha Cabal, MD;  Location: [facility] OR[facility];  Service: Vascular;  Laterality: Right;  . ESOPHAGOGASTRODUODENOSCOPY (EGD) WITH PROPOFOL N/A 11/28/2020   Procedure: ESOPHAGOGASTRODUODENOSCOPY (EGD) WITH PROPOFOL;  Surgeon: Lucilla Lame, MD;  Location: [facility] END[facility]COPY;  Service: Endoscopy;  Laterality: N/A;  . EYE SURGERY    . THROAT SURGERY     x 10 yrs ago. throat mass.  . TONSILLECTOMY       reports that she  has never smoked. She has never used smokeless tobacco. She reports that she does not drink alcohol and does not use drugs.  Allergies  Allergen Reactions  . Amoxicillin-Pot Clavulanate Other (See Comments)    Unknown reaction Has patient had a PCN reaction causing immediate rash, facial/tongue/throat swelling, SOB or lightheadedness with hypotension: Unknown Has patient had a PCN reaction causing severe rash involving mucus membranes or skin necrosis: Unknown Has patient had a PCN reaction that required hospitalization: Unknown Has patient had a PCN reaction occurring within the last 10 years: No If all of the above answers are "NO", then may proceed with Cephalosporin use.   . Codeine Hives  . Hydrocodone Nausea Only  . Metronidazole Nausea Only  . Propoxyphene Nausea Only  . Latex Rash    Family History  Problem Relation Age of Onset  . Anuerysm Mother   . Cancer Father        prostrate cancer  . Diabetes Brother   . Hearing loss Brother   . Bladder Cancer Neg Hx   . Kidney cancer Neg Hx       Prior to Admission medications   Medication Sig Start Date End Date Taking? Authorizing Provider  acetaminophen (TYLENOL) 500 MG tablet Take 500 mg by mouth daily as needed for headache.   Yes [provider]  ascorbic acid (VITAMIN C) 1000 MG tablet Take 1,000 mg by mouth daily.   Yes [provider]  aspirin 81 MG tablet Take 81 mg by mouth daily.   Yes [provider]  Cholecalciferol (VITAMIN D3) 1.25 MG (50000 UT) CAPS Take by mouth.   Yes [provider]  fluticasone (FLONASE) 50 MCG/ACT nasal spray Place into the nose. 08/29/20 08/29/21 Yes [provider]  gabapentin (NEURONTIN) 300 MG capsule Take 1 capsule (300 mg total) by mouth 3 (three) times daily. 11/28/20 02/26/21 Yes Max Sane, MD  isosorbide mononitrate (IMDUR) 60 MG 24 hr tablet Take 60 mg by mouth daily.   Yes [provider]  lidocaine (LIDODERM) 5 % Place onto  the skin. 11/24/20 12/24/20 Yes [provider]  mirabegron ER (MYRBETRIQ) 25 MG TB24 tablet Take by mouth. 11/24/19  Yes [provider]  nitrofurantoin, macrocrystal-monohydrate, (MACROBID) 100 MG capsule Take 1 capsule (100 mg total) by mouth 2 (two) times daily. 12/15/20  Yes [name], MD  ondansetron (ZOFRAN) 4 MG tablet Take 4 mg by mouth every 8 (eight) hours as needed. 12/18/20  Yes [provider]  oxyCODONE-acetaminophen (PERCOCET/ROXICET) 5-325 MG tablet Take by mouth. 12/06/20  Yes [provider]  pantoprazole (PROTONIX) 40 MG tablet Take 1 tablet (40 mg total) by mouth 2 (two) times daily before a meal. 11/28/20 12/28/20 Yes Max Sane, MD  vitamin E 180 MG (400 UNITS) capsule Take 400 Units by mouth daily.   Yes [provider]  ALPRAZolam (XANAX) 0.25 MG tablet Take 0.25 mg by mouth at bedtime as needed for sleep.    [provider]  cloNIDine (CATAPRES) 0.1 MG tablet Take 0.1 mg by mouth at bedtime. 07/24/20  [provider]  conjugated estrogens (PREMARIN) vaginal cream Apply one pea-sized amount around the opening of the urethra three times weekly. 10/19/20   Vaillancourt, Aldona Bar, PA-C  felodipine (PLENDIL) 2.5 MG 24 hr tablet Take 2.5 mg by mouth daily. May take a second 2.5 mg dose as needed if bp is over 150/90    [provider]  fexofenadine (ALLEGRA) 180 MG tablet Take 180 mg by mouth daily as needed for allergies.     [provider]  hydrochlorothiazide (HYDRODIURIL) 12.5 MG tablet Take 1 tablet by mouth daily. 06/15/20   [provider]  losartan (COZAAR) 100 MG tablet Take 1 tablet by mouth daily. 06/15/20   [provider]  metoprolol tartrate (LOPRESSOR) 100 MG tablet Take 100 mg by mouth 2 (two) times daily. 09/27/20   [provider]  phenazopyridine (PYRIDIUM) 200 MG tablet Take 1 tablet (200 mg total) by mouth 3 (three) times daily as needed for pain (urethral  spasm). Patient not taking: No sig reported 12/15/20   [name], MD  rifaximin (XIFAXAN) 550 MG TABS tablet Take by mouth. Patient not taking: Reported on 12/19/2020 12/14/20 12/28/20  [provider]  sulfamethoxazole-trimethoprim (BACTRIM DS) 800-160 MG tablet Take 1 tablet by mouth 2 (two) times daily. Patient not taking: No sig reported 12/16/20   Chevis Pretty, FNP  traMADol (ULTRAM) 50 MG tablet Take 50 mg by mouth every 6 (six) hours as needed. Patient not taking: Reported on 12/19/2020 08/31/20   [provider]  valACYclovir (VALTREX) 1000 MG tablet Take 1 tablet (1,000 mg total) by mouth 3 (three) times daily. 11/28/20   Max Sane, MD    Physical Exam: Vitals:   12/19/20 1245 12/19/20 1300 12/19/20 1315 12/19/20 1330  BP:  (!) 160/59    Pulse: 84 78 81 85  Resp: 17 16 17  (!) 21  Temp:      TempSrc:      SpO2: 98% 95% 94% 99%  Weight:      Height:         Vitals:   12/19/20 1245 12/19/20 1300 12/19/20 1315 12/19/20 1330  BP:  (!) 160/59    Pulse: 84 78 81 85  Resp: 17 16 17  (!) 21  Temp:      TempSrc:      SpO2: 98% 95% 94% 99%  Weight:      Height:          Constitutional: Alert and oriented x 3 . Not in any apparent distress HEENT:      Head: Normocephalic and atraumatic.         Eyes: PERLA, EOMI, Conjunctivae are normal. Sclera is non-icteric.       Mouth/Throat: Mucous membranes are dry      Neck: Supple with no signs of meningismus. Cardiovascular: Regular rate and rhythm. No murmurs, gallops, or rubs. 2+ symmetrical distal pulses are present . No JVD. No LE edema Respiratory: Respiratory effort normal .Lungs sounds clear bilaterally. No wheezes, crackles, or rhonchi.  Gastrointestinal: Soft, lower quadrants bilaterally , and non distended with positive bowel sounds.  Genitourinary: No CVA tenderness. Musculoskeletal: Nontender with normal range of motion in all extremities. No cyanosis, or erythema of extremities. Neurologic:   Face is symmetric. Moving all extremities. No gross focal neurologic deficits . Skin: Skin is warm, dry.  No rash or ulcers Psychiatric: Mood and affect are normal   Labs on Admission: I have personally reviewed following labs and imaging studies  CBC: Recent  Labs  Lab 12/19/20 0915  WBC 5.9  HGB 12.5  HCT 37.2  MCV 96.4  PLT [id number]   Basic Metabolic Panel: Recent Labs  Lab 12/19/20 0915 12/19/20 1324  NA 138  --   K 2.6*  --   CL 102  --   CO2 25  --   GLUCOSE 137*  --   BUN 12  --   CREATININE 0.99  --   CALCIUM 9.8  --   MG  --  1.2*   GFR: Estimated Creatinine Clearance: 37.1 mL/min (by C-G formula based on SCr of 0.99 mg/dL). Liver Function Tests: Recent Labs  Lab 12/19/20 0915  AST 19  ALT 7  ALKPHOS 60  BILITOT 0.9  PROT 6.8  ALBUMIN 4.1   Recent Labs  Lab 12/19/20 0915  LIPASE 34   No results for input(s): AMMONIA in the last 168 hours. Coagulation Profile: No results for input(s): INR, PROTIME in the last 168 hours. Cardiac Enzymes: No results for input(s): CKTOTAL, CKMB, CKMBINDEX, TROPONINI in the last 168 hours. BNP (last 3 results) No results for input(s): PROBNP in the last 8760 hours. HbA1C: No results for input(s): HGBA1C in the last 72 hours. CBG: No results for input(s): GLUCAP in the last 168 hours. Lipid Profile: No results for input(s): CHOL, HDL, LDLCALC, TRIG, CHOLHDL, LDLDIRECT in the last 72 hours. Thyroid Function Tests: No results for input(s): TSH, T4TOTAL, FREET4, T3FREE, THYROIDAB in the last 72 hours. Anemia Panel: No results for input(s): VITAMINB12, FOLATE, FERRITIN, TIBC, IRON, RETICCTPCT in the last 72 hours. Urine analysis:    Component Value Date/Time   COLORURINE AMBER (A) 12/19/2020 1120   APPEARANCEUR CLEAR (A) 12/19/2020 1120   APPEARANCEUR Cloudy (A) 05/18/2019 1404   LABSPEC 1.008 12/19/2020 1120   LABSPEC 1.026 09/12/2011 2304   PHURINE 6.0 12/19/2020 1120   GLUCOSEU NEGATIVE 12/19/2020 1120    GLUCOSEU Negative 09/12/2011 2304   HGBUR NEGATIVE 12/19/2020 1120   BILIRUBINUR NEGATIVE 12/19/2020 1120   BILIRUBINUR Negative 05/18/2019 1404   BILIRUBINUR Negative 09/12/2011 2304   KETONESUR 5 (A) 12/19/2020 1120   PROTEINUR NEGATIVE 12/19/2020 1120   UROBILINOGEN 0.2 05/17/2016 0904   NITRITE POSITIVE (A) 12/19/2020 1120   LEUKOCYTESUR NEGATIVE 12/19/2020 1120   LEUKOCYTESUR 2+ 09/12/2011 2304    Radiological Exams on Admission: CT ABDOMEN PELVIS WO CONTRAST  Result Date: 12/19/2020 CLINICAL DATA:  87 year old female with left lower quadrant abdominal pain. EXAM: CT ABDOMEN AND PELVIS WITHOUT CONTRAST TECHNIQUE: Multidetector CT imaging of the abdomen and pelvis was performed following the standard protocol without IV contrast. COMPARISON:  11/26/2020 FINDINGS: Lower chest: No acute abnormality. Hepatobiliary: No focal liver abnormality is seen. No gallstones, gallbladder wall thickening, or biliary dilatation. Pancreas: Unremarkable. No pancreatic ductal dilatation or surrounding inflammatory changes. Spleen: Normal in size without focal abnormality. Adrenals/Urinary Tract: Adrenal glands are unremarkable. Kidneys are normal, without renal calculi, focal lesion, or hydronephrosis. Bladder is unremarkable. Stomach/Bowel: Unchanged small hiatal hernia. Stomach is within otherwise normal limits. Appendix appears normal. Similar appearing scattered descending and sigmoid colonic diverticula without surrounding inflammatory changes. No evidence of bowel wall thickening, distention, or inflammatory changes. Vascular/Lymphatic: Aortic atherosclerosis. No enlarged abdominal or pelvic lymph nodes. Reproductive: Status post hysterectomy. No adnexal masses. Other: No abdominal wall hernia or abnormality. No abdominopelvic ascites. Musculoskeletal: No acute or significant osseous findings. IMPRESSION: 1. No acute abnormality in the abdomen or pelvis to explain left lower quadrant pain. 2. Unchanged  descending and sigmoid colonic diverticula, no evidence of  diverticulitis. 3. Unchanged small hiatal hernia. 4.  Aortic Atherosclerosis (ICD10-I70.0). Electronically Signed   By: Ruthann Cancer MD   On: 12/19/2020 10:25     Assessment/Plan Principal Problem:   Abdominal pain Active Problems:   Anxiety   CKD stage 3 due to type 2 diabetes mellitus (HCC)   HTN (hypertension)   Acute lower UTI   Hypokalemia   Hypomagnesemia   Small intestinal bacterial overgrowth (SIBO)      Abdominal pain Unclear etiology ??  Secondary to small intestinal bacterial overgrowth Patient presents with nausea, emesis and anorexia. Keep patient n.p.o. for now except meds IV fluid hydration IV PPI and antiemetics We will start patient on Xifaxan per GI recommendation as an outpatient. Discussion with GI prior to discharge on alternative for Xifaxan as patient's insurance will not pay for Xifaxan.     UTI Patient presents with dysuria and frequency. She has pyuria and prior urine culture yielded E. coli and Klebsiella aerogenes We will treat patient with Rocephin 1 g IV daily until urine culture results become available    Hypokalemia/hypomagnesemia Secondary to GI losses from nausea and vomiting Supplement magnesium and potassium     Diabetes mellitus with complications of stage III chronic kidney disease Patient has diet-controlled diabetes Blood sugar checks every 4 hours Renal function appears to be stable and at baseline    Hypertension Patient is on multiple antihypertensive medications We will hold hydrochlorothiazide, clonidine and felodipine since she is normotensive Holding parameters for metoprolol, losartan and nitrates    Anxiety disorder Continue alprazolam as needed  DVT prophylaxis: Lovenox  Code Status: full code Family Communication: Greater than 50% of time was spent discussing patient's condition and plan of care with her at the bedside.  All questions and  concerns have been addressed.  She verbalizes understanding and agrees with the plan. Disposition Plan: Back to previous home environment Consults called: none Status: Observation    Jonhatan Hearty MD [facility]     12/19/2020, 2:02 PM

## 2020-12-19 NOTE — ED Provider Notes (Signed)
[facility] Emergency Department Provider Note ____________________________________________   Event Date/Time   First MD Initiated Contact with Patient 12/19/20 0913     (approximate)  I have reviewed the triage vital signs and the nursing notes.   HISTORY  Chief Complaint Abdominal Pain    HPI Dawn Mcintyre is a 87 y.o. female with PMH as noted below including HTN, HLD, carotid stenosis s/p b/l CEA's, PAD, CAD, GERD, borderline type 2 diabetes, neuropathy, CKD 3, anxiety who presents with left lower abdominal pain over the last several days, gradual onset, worsening course, and associated with decreased appetite and generalized weakness.  The patient states that she was diagnosed with a bacterial intestinal infection and was prescribed a medication, however the insurance did not approve it and she was unable to get in touch with the GI doctor to find an alternative.  She was also recently diagnosed with a UTI and is about to finish 3 days of antibiotics.  The patient reports nausea and a few episodes of vomiting yesterday.  She denies any diarrhea or fever.  She denies any dysuria.  Past Medical History:  Diagnosis Date  . Arteritis ([place])   . Arthritis   . Atrophic vaginitis 11/16/2014  . Carotid artery stenosis   . Cataract   . Difficult intubation   . Environmental and seasonal allergies   . GERD (gastroesophageal reflux disease)   . Hyperlipidemia   . Hypertension   . Lack of bladder control   . Neuropathy   . Osteoporosis   . Pneumonia     x 66yrs ago  . Pre-diabetes   . Shingles   . Ulcer     Patient Active Problem List   Diagnosis Date Noted  . Acute lower UTI 12/19/2020  . Hypokalemia 12/19/2020  . Hypomagnesemia 12/19/2020  . Small intestinal bacterial overgrowth (SIBO) 12/19/2020  . Abnormality of gait 12/04/2020  . Melena   . LLQ abdominal pain   . Post herpetic neuralgia   . Abdominal pain 11/26/2020  . Greater trochanteric  bursitis of right hip 02/02/2019  . Diabetic neuropathy ([place]) 12/17/2018  . PAD (peripheral artery disease) ([place]) 10/05/2018  . Urinary incontinence in female 09/08/2018  . Carotid stenosis, bilateral 12/24/2017  . Syncope and collapse 11/15/2017  . CAD (coronary artery disease) 11/15/2017  . Leg pain 10/26/2017  . Bilateral carotid artery disease ([place]) 07/11/2016  . Primary osteoarthritis involving multiple joints 07/11/2016  . Pure hypercholesterolemia 07/11/2016  . Carotid stenosis 07/01/2016  . Hyperlipidemia 07/01/2016  . Peripheral sensory neuropathy ([place]) 03/25/2016  . Type 2 diabetes mellitus with stage 3 chronic kidney disease, without long-term current use of insulin ([place]) 03/25/2016  . Anxiety 11/24/2015  . Sensory peripheral neuropathy ([place]) 10/30/2015  . CKD stage 3 due to type 2 diabetes mellitus ([place]) 07/19/2015  . Osteopenia 07/19/2015  . Chronic insomnia 01/25/2015  . Diet-controlled type 2 diabetes mellitus ([place]) 01/25/2015  . Gastroesophageal reflux disease without esophagitis 01/25/2015  . Mixed stress and urge urinary incontinence 01/25/2015  . Atrophic vaginitis 11/16/2014  . Vitamin B 12 deficiency 10/26/2014  . Abdominal pain, chronic, epigastric 06/15/2014  . Essential hypertension 04/28/2014  . Sciatica of left side 04/07/2014  . HTN (hypertension) 01/28/2014  . Type 2 diabetes mellitus ([place]) 01/28/2014    Past Surgical History:  Procedure Laterality Date  . ABDOMINAL HYSTERECTOMY  1972  . BREAST CYST EXCISION Right 1972  . BREAST CYST EXCISION Left 1972  . ENDARTERECTOMY Left 12/24/2017  Procedure: ENDARTERECTOMY CAROTID;  Surgeon: Katha Cabal, MD;  Location: [facility] ORS;  Service: Vascular;  Laterality: Left;  . ENDARTERECTOMY Right 03/13/2018   Procedure: ENDARTERECTOMY CAROTID;  Surgeon: Katha Cabal, MD;  Location: [facility] ORS;  Service: Vascular;  Laterality: Right;  . ESOPHAGOGASTRODUODENOSCOPY (EGD) WITH PROPOFOL N/A 11/28/2020    Procedure: ESOPHAGOGASTRODUODENOSCOPY (EGD) WITH PROPOFOL;  Surgeon: Lucilla Lame, MD;  Location[facility] ENDOSCOPY;  Service: Endoscopy;  Laterality: N/A;  . EYE SURGERY    . THROAT SURGERY     x 10 yrs ago. throat mass.  . TONSILLECTOMY      Prior to Admission medications   Medication Sig Start Date End Date Taking? Authorizing Provider  acetaminophen (TYLENOL) 500 MG tablet Take 500 mg by mouth daily as needed for headache.   Yes [provider]  ascorbic acid (VITAMIN C) 1000 MG tablet Take 1,000 mg by mouth daily.   Yes [provider]  aspirin 81 MG tablet Take 81 mg by mouth daily.   Yes [provider]  Cholecalciferol (VITAMIN D3) 1.25 MG (50000 UT) CAPS Take by mouth.   Yes [provider]  fluticasone (FLONASE) 50 MCG/ACT nasal spray Place into the nose. 08/29/20 08/29/21 Yes [provider]  gabapentin (NEURONTIN) 300 MG capsule Take 1 capsule (300 mg total) by mouth 3 (three) times daily. 11/28/20 02/26/21 Yes Max Sane, MD  isosorbide mononitrate (IMDUR) 60 MG 24 hr tablet Take 60 mg by mouth daily.   Yes [provider]  lidocaine (LIDODERM) 5 % Place onto the skin. 11/24/20 12/24/20 Yes [provider]  mirabegron ER (MYRBETRIQ) 25 MG TB24 tablet Take by mouth. 11/24/19  Yes [provider]  nitrofurantoin, macrocrystal-monohydrate, (MACROBID) 100 MG capsule Take 1 capsule (100 mg total) by mouth 2 (two) times daily. 12/15/20  Yes [name], MD  ondansetron (ZOFRAN) 4 MG tablet Take 4 mg by mouth every 8 (eight) hours as needed. 12/18/20  Yes [provider]  oxyCODONE-acetaminophen (PERCOCET/ROXICET) 5-325 MG tablet Take by mouth. 12/06/20  Yes [provider]  pantoprazole (PROTONIX) 40 MG tablet Take 1 tablet (40 mg total) by mouth 2 (two) times daily before a meal. 11/28/20 12/28/20 Yes Max Sane, MD  vitamin E 180 MG (400 UNITS) capsule Take 400 Units by mouth daily.   Yes [provider]  ALPRAZolam (XANAX) 0.25 MG tablet Take 0.25 mg by mouth at bedtime as needed for sleep.    [provider]  cloNIDine (CATAPRES) 0.1 MG tablet Take 0.1 mg by mouth at bedtime. 07/24/20   [provider]  conjugated estrogens (PREMARIN) vaginal cream Apply one pea-sized amount around the opening of the urethra three times weekly. 10/19/20   Vaillancourt, Aldona Bar, PA-C  felodipine (PLENDIL) 2.5 MG 24 hr tablet Take 2.5 mg by mouth daily. May take a second 2.5 mg dose as needed if bp is over 150/90    [provider]  fexofenadine (ALLEGRA) 180 MG tablet Take 180 mg by mouth daily as needed for allergies.     [provider]  hydrochlorothiazide (HYDRODIURIL) 12.5 MG tablet Take 1 tablet by mouth daily. 06/15/20   [provider]  losartan (COZAAR) 100 MG tablet Take 1 tablet by mouth daily. 06/15/20   [provider]  metoprolol tartrate (LOPRESSOR) 100 MG tablet Take 100 mg by mouth 2 (two) times daily. 09/27/20   [provider]  phenazopyridine (PYRIDIUM) 200 MG tablet Take 1 tablet (200 mg total) by mouth 3 (three)  times daily as needed for pain (urethral spasm). Patient not taking: No sig reported 12/15/20   Reva Bores, MD  rifaximin (XIFAXAN) 550 MG TABS tablet Take by mouth. Patient not taking: Reported on 12/19/2020 12/14/20 12/28/20  [provider]  sulfamethoxazole-trimethoprim (BACTRIM DS) 800-160 MG tablet Take 1 tablet by mouth 2 (two) times daily. Patient not taking: No sig reported 12/16/20   Bennie Pierini, FNP  traMADol (ULTRAM) 50 MG tablet Take 50 mg by mouth every 6 (six) hours as needed. Patient not taking: Reported on 12/19/2020 08/31/20   [provider]  valACYclovir (VALTREX) 1000 MG tablet Take 1 tablet (1,000 mg total) by mouth 3 (three) times daily. 11/28/20   Delfino Lovett, MD    Allergies Amoxicillin-pot clavulanate, Codeine, Hydrocodone, Metronidazole, Propoxyphene, and  Latex  Family History  Problem Relation Age of Onset  . Anuerysm Mother   . Cancer Father        prostrate cancer  . Diabetes Brother   . Hearing loss Brother   . Bladder Cancer Neg Hx   . Kidney cancer Neg Hx     Social History Social History   Tobacco Use  . Smoking status: Never Smoker  . Smokeless tobacco: Never Used  Vaping Use  . Vaping Use: Never used  Substance Use Topics  . Alcohol use: No    Alcohol/week: 0.0 standard drinks  . Drug use: No    Review of Systems  Constitutional: No fever. Eyes: No redness. ENT: No sore throat. Cardiovascular: Denies chest pain. Respiratory: Denies shortness of breath. Gastrointestinal: Positive for nausea.   Genitourinary: Negative for dysuria.  Musculoskeletal: Negative for back pain. Skin: Negative for rash. Neurological: Negative for headache.   ____________________________________________   PHYSICAL EXAM:  VITAL SIGNS: ED Triage Vitals  Enc Vitals Group     BP 12/19/20 0906 139/77     Pulse Rate 12/19/20 0906 79     Resp 12/19/20 0906 20     Temp 12/19/20 0906 97.9 F (36.6 C)     Temp Source 12/19/20 0906 Oral     SpO2 12/19/20 0906 100 %     Weight 12/19/20 0907 124 lb 11.2 oz (56.6 kg)     Height 12/19/20 0907 5\' 6"  (1.676 m)     Head Circumference --      Peak Flow --      Pain Score 12/19/20 0907 10     Pain Loc --      Pain Edu? --      Excl. in GC? --     Constitutional: Alert and oriented.  Somewhat weak appearing but in no acute distress. Eyes: Conjunctivae are normal.  No scleral icterus. Head: Atraumatic. Nose: No congestion/rhinnorhea. Mouth/Throat: Mucous membranes are dry. Neck: Normal range of motion.  Cardiovascular: Normal rate, regular rhythm.  Good peripheral circulation. Respiratory: Normal respiratory effort.  No retractions.  Gastrointestinal: Soft with mild left lower quadrant tenderness.  No distention.  Genitourinary: No flank tenderness. Musculoskeletal: No lower  extremity edema.  Extremities warm and well perfused.  Neurologic:  Normal speech and language. No gross focal neurologic deficits are appreciated.  Skin:  Skin is warm and dry. No rash noted. Psychiatric: Mood and affect are normal. Speech and behavior are normal.  ____________________________________________   LABS (all labs ordered are listed, but only abnormal results are displayed)  Labs Reviewed  COMPREHENSIVE METABOLIC PANEL - Abnormal; Notable for the following components:      Result Value   Potassium 2.6 (*)  Glucose, Bld 137 (*)    GFR, Estimated 56 (*)    All other components within normal limits  CBC - Abnormal; Notable for the following components:   RBC 3.86 (*)    All other components within normal limits  URINALYSIS, COMPLETE (UACMP) WITH MICROSCOPIC - Abnormal; Notable for the following components:   Color, Urine AMBER (*)    APPearance CLEAR (*)    Ketones, ur 5 (*)    Nitrite POSITIVE (*)    Bacteria, UA MANY (*)    All other components within normal limits  MAGNESIUM - Abnormal; Notable for the following components:   Magnesium 1.2 (*)    All other components within normal limits  RESP PANEL BY RT-PCR (FLU A&B, COVID) ARPGX2  LIPASE, BLOOD   ____________________________________________  EKG  ED ECG REPORT I, Arta Silence, the attending physician, personally viewed and interpreted this ECG.  Date: 12/19/2020 EKG Time: 1011 Rate: 74 Rhythm: normal sinus rhythm QRS Axis: normal Intervals: normal ST/T Wave abnormalities: Nonspecific ST abnormalities laterally Narrative Interpretation: Nonspecific abnormalities with no evidence of acute ischemia; no significant change when compared to EKG of 11/09/2020  ____________________________________________  RADIOLOGY  CT abdomen/pelvis: No acute abnormality  ____________________________________________   PROCEDURES  Procedure(s) performed: No  Procedures  Critical Care performed:  No ____________________________________________   INITIAL IMPRESSION / ASSESSMENT AND PLAN / ED COURSE  Pertinent labs & imaging results that were available during my care of the patient were reviewed by me and considered in my medical decision making (see chart for details).  87 year old female with PMH as noted above presents with left lower quadrant abdominal pain over the last several days associated with nausea and vomiting as well as decreased p.o. intake and generalized weakness.  She reports that she is being treated for UTI and was also recently diagnosed with an intestinal bacterial infection but was unable to obtain the prescribed medication.  I reviewed the past medical records in [facility] and [place].  The patient was most recently admitted last month with left lower quadrant pain and dark stools.  She had an EGD that showed a small hiatal hernia but no other acute abnormalities.  CT on 4/24 did not show acute abnormality.  The pain was thought to be due to postherpetic neuralgia from recent shingles although the patient states that this has now resolved.  She was started on Bactrim for UTI by her PMD on 5/14.  She was seen by Dr. Alice Reichert from GI on 5/12 and diagnosed with small intestinal bacterial overgrowth.  She was prescribed rifaximin although the patient reports that her insurance did not approve it, and she was unable to get in touch with her doctor to find an alternative.  On exam today, the patient is overall relatively well-appearing.  Her vital signs are normal.  She is tender in the left lower quadrant and mucous membranes are dry.  Exam is otherwise unremarkable.  Differential includes SIBO, diverticulitis, colitis, acute gastroenteritis, continued postherpetic neuralgia, UTI/pyelonephritis, or less likely SBO or volvulus.  We will obtain a CT, labs, urinalysis, and reassess.  ----------------------------------------- 3:06 PM on  12/19/2020 -----------------------------------------  CT is negative for acute findings.  Lab work-up is significant for hypokalemia.  Urinalysis is consistent with partially treated UTI.  Given the patient's generalized weakness, poor p.o. intake, and relatively severe pain, we will admit for IV antibiotics, hydration, and potassium repletion.  I consulted Dr. Francine Graven from the hospitalist service for admission.  ____________________________________________   FINAL CLINICAL IMPRESSION(S) /  ED DIAGNOSES  Final diagnoses:  Left lower quadrant abdominal pain  Hypokalemia  Urinary tract infection without hematuria, site unspecified      NEW MEDICATIONS STARTED DURING THIS VISIT:  New Prescriptions   No medications on file     Note:  This document was prepared using Dragon voice recognition software and may include unintentional dictation errors.    Arta Silence, MD 12/19/20 [phone]

## 2020-12-20 DIAGNOSIS — Z833 Family history of diabetes mellitus: Secondary | ICD-10-CM | POA: Diagnosis not present

## 2020-12-20 DIAGNOSIS — M810 Age-related osteoporosis without current pathological fracture: Secondary | ICD-10-CM | POA: Diagnosis present

## 2020-12-20 DIAGNOSIS — E876 Hypokalemia: Secondary | ICD-10-CM | POA: Diagnosis present

## 2020-12-20 DIAGNOSIS — Z888 Allergy status to other drugs, medicaments and biological substances status: Secondary | ICD-10-CM | POA: Diagnosis not present

## 2020-12-20 DIAGNOSIS — E1151 Type 2 diabetes mellitus with diabetic peripheral angiopathy without gangrene: Secondary | ICD-10-CM | POA: Diagnosis present

## 2020-12-20 DIAGNOSIS — I2510 Atherosclerotic heart disease of native coronary artery without angina pectoris: Secondary | ICD-10-CM | POA: Diagnosis present

## 2020-12-20 DIAGNOSIS — Z880 Allergy status to penicillin: Secondary | ICD-10-CM | POA: Diagnosis not present

## 2020-12-20 DIAGNOSIS — K5750 Diverticulosis of both small and large intestine without perforation or abscess without bleeding: Secondary | ICD-10-CM | POA: Diagnosis present

## 2020-12-20 DIAGNOSIS — N390 Urinary tract infection, site not specified: Secondary | ICD-10-CM | POA: Diagnosis present

## 2020-12-20 DIAGNOSIS — I129 Hypertensive chronic kidney disease with stage 1 through stage 4 chronic kidney disease, or unspecified chronic kidney disease: Secondary | ICD-10-CM | POA: Diagnosis present

## 2020-12-20 DIAGNOSIS — I700 Atherosclerosis of aorta: Secondary | ICD-10-CM | POA: Diagnosis present

## 2020-12-20 DIAGNOSIS — F419 Anxiety disorder, unspecified: Secondary | ICD-10-CM | POA: Diagnosis present

## 2020-12-20 DIAGNOSIS — E1122 Type 2 diabetes mellitus with diabetic chronic kidney disease: Secondary | ICD-10-CM | POA: Diagnosis present

## 2020-12-20 DIAGNOSIS — K219 Gastro-esophageal reflux disease without esophagitis: Secondary | ICD-10-CM | POA: Diagnosis present

## 2020-12-20 DIAGNOSIS — I10 Essential (primary) hypertension: Secondary | ICD-10-CM | POA: Diagnosis not present

## 2020-12-20 DIAGNOSIS — Z20822 Contact with and (suspected) exposure to covid-19: Secondary | ICD-10-CM | POA: Diagnosis present

## 2020-12-20 DIAGNOSIS — N1831 Chronic kidney disease, stage 3a: Secondary | ICD-10-CM | POA: Diagnosis present

## 2020-12-20 DIAGNOSIS — K6389 Other specified diseases of intestine: Secondary | ICD-10-CM | POA: Diagnosis not present

## 2020-12-20 DIAGNOSIS — Z885 Allergy status to narcotic agent status: Secondary | ICD-10-CM | POA: Diagnosis not present

## 2020-12-20 DIAGNOSIS — Z91040 Latex allergy status: Secondary | ICD-10-CM | POA: Diagnosis not present

## 2020-12-20 DIAGNOSIS — R109 Unspecified abdominal pain: Secondary | ICD-10-CM | POA: Diagnosis present

## 2020-12-20 DIAGNOSIS — E785 Hyperlipidemia, unspecified: Secondary | ICD-10-CM | POA: Diagnosis present

## 2020-12-20 DIAGNOSIS — R1030 Lower abdominal pain, unspecified: Secondary | ICD-10-CM | POA: Diagnosis not present

## 2020-12-20 DIAGNOSIS — K449 Diaphragmatic hernia without obstruction or gangrene: Secondary | ICD-10-CM | POA: Diagnosis present

## 2020-12-20 LAB — GLUCOSE, CAPILLARY
Glucose-Capillary: 83 mg/dL (ref 70–99)
Glucose-Capillary: 113 mg/dL — ABNORMAL HIGH (ref 70–99)
Glucose-Capillary: 107 mg/dL — ABNORMAL HIGH (ref 70–99)

## 2020-12-20 LAB — BASIC METABOLIC PANEL
Anion gap: 4 — ABNORMAL LOW (ref 5–15)
BUN: 8 mg/dL (ref 8–23)
CO2: 24 mmol/L (ref 22–32)
Calcium: 9.1 mg/dL (ref 8.9–10.3)
Chloride: 112 mmol/L — ABNORMAL HIGH (ref 98–111)
Creatinine, Ser: 0.75 mg/dL (ref 0.44–1.00)
GFR, Estimated: >60 mL/min (ref >60)
Glucose, Bld: 91 mg/dL (ref 70–99)
Potassium: 3.7 mmol/L (ref 3.5–5.1)
Sodium: 140 mmol/L (ref 135–145)

## 2020-12-20 LAB — CBC
HCT: 31 % — ABNORMAL LOW (ref 36.0–46.0)
Hemoglobin: 10.4 g/dL — ABNORMAL LOW (ref 12.0–15.0)
MCH: 32.3 pg (ref 26.0–34.0)
MCHC: 33.5 g/dL (ref 30.0–36.0)
MCV: 96.3 fL (ref 80.0–100.0)
Platelets: 201 10*3/uL (ref 150–400)
RBC: 3.22 MIL/uL — ABNORMAL LOW (ref 3.87–5.11)
RDW: 13.9 % (ref 11.5–15.5)
WBC: 5.3 10*3/uL (ref 4.0–10.5)
nRBC: 0 % (ref 0.0–0.2)

## 2020-12-20 MED ORDER — BISACODYL 5 MG PO TBEC
10.0000 mg | DELAYED_RELEASE_TABLET | Freq: Once | ORAL | Status: AC
  Administered 2020-12-20: 10 mg via ORAL
  Filled 2020-12-20: qty 2

## 2020-12-20 MED ORDER — ENSURE ENLIVE PO LIQD
237.0000 mL | Freq: Two times a day (BID) | ORAL | Status: DC
  Administered 2020-12-21: 237 mL via ORAL

## 2020-12-20 MED ORDER — DOCUSATE SODIUM 100 MG PO CAPS
200.0000 mg | ORAL_CAPSULE | Freq: Two times a day (BID) | ORAL | Status: DC
  Administered 2020-12-20 – 2020-12-21 (×3): 200 mg via ORAL
  Filled 2020-12-20 (×3): qty 2

## 2020-12-20 NOTE — Evaluation (Signed)
Occupational Therapy Evaluation Patient Details Name: Dawn Mcintyre MRN: [id number] DOB: 02-03-1933 Today's Date: 12/20/2020    History of Present Illness 87 y.o. female with medical history significant for recently diagnosed small intestine bacterial overgrowth, GERD, hypertension and dyslipidemia who presents to the ER for evaluation of abdominal pain and UTI.  Pain is mostly in the lower abdomen, rated 10 x 10 in intensity at its worst and is constant.  She had an endoscopy done 2 weeks ago and was told she had small intestine bacterial overgrowth.   She admits to some weight loss but is unable to tell me how much weight she has lost.  She also complains of weakness, frequency of urination and dysuria.   Clinical Impression   Patient presenting with decreased I in self care, balance, functional mobility/transfers, endurance, and safety awareness.  Patient reports living alone PTA without use of AD. Pt is very active at baseline and performs her own ADLs, IADLs, and drives.  Patient currently functioning at supervision overall without use of AD. She does move slowly during session and reports pain with urination but performs all mobility and ADLs with close supervision for safety. She has great family support and neighbors that will assist as needed at discharge. Pt seated in recliner chair at end of the session.  Patient will benefit from acute OT to increase overall independence in the areas of ADLs, functional mobility, and safety awareness in order to safely discharge home.    Follow Up Recommendations  Home health OT;Supervision - Intermittent    Equipment Recommendations  None recommended by OT       Precautions / Restrictions Precautions Precautions: Fall      Mobility Bed Mobility Overal bed mobility: Needs Assistance Bed Mobility: Supine to Sit     Supine to sit: Supervision;HOB elevated     General bed mobility comments: no physical assistance needed but min cuing for  technique    Transfers Overall transfer level: Needs assistance Equipment used: None Transfers: Sit to/from [facility] Sit to Stand: Supervision Stand pivot transfers: Supervision       General transfer comment: close supervision for safety. OT initially having pt ambulate while holding onto IV pole and then pt able to do so without.    Balance Overall balance assessment: Needs assistance Sitting-balance support: Feet supported Sitting balance-Leahy Scale: Good Sitting balance - Comments: no LOB   Standing balance support: During functional activity Standing balance-Leahy Scale: Fair                             ADL either performed or assessed with clinical judgement   ADL Overall ADL's : Needs assistance/impaired     Grooming: Wash/dry hands;Standing;Supervision/safety                   Toilet Transfer: Supervision/safety;Regular Toilet;Grab bars   Toileting- Water quality scientist and Hygiene: Supervision/safety;Sit to/from stand       Functional mobility during ADLs: Supervision/safety       Vision Patient Visual Report: No change from baseline              Pertinent Vitals/Pain Pain Assessment: 0-10 Pain Score: 6  Pain Location: painful urination Pain Descriptors / Indicators: Discomfort Pain Intervention(s): Limited activity within patient's tolerance;Repositioned;RN gave pain meds during session     Hand Dominance Right   Extremity/Trunk Assessment Upper Extremity Assessment Upper Extremity Assessment: Overall WFL for tasks assessed;Generalized weakness   Lower  Extremity Assessment Lower Extremity Assessment: Overall WFL for tasks assessed;Generalized weakness       Communication Communication Communication: No difficulties   Cognition Arousal/Alertness: Awake/alert Behavior During Therapy: WFL for tasks assessed/performed Overall Cognitive Status: Within Functional Limits for tasks assessed                                  General Comments: very pleasant and cooperative. A & O x4              Home Living Family/patient expects to be discharged to:: Private residence Living Arrangements: Alone Available Help at Discharge: Family;Available PRN/intermittently;Neighbor;Friend(s)   Home Access: Level entry     Home Layout: One level     Bathroom Shower/Tub: Tub/shower unit         Home Equipment: Shower seat;Grab bars - toilet;Grab bars - tub/shower          Prior Functioning/Environment Level of Independence: Independent        Comments: Pt is I with ADLS, IADLs, drives, and is very active.        OT Problem List: Decreased strength;Impaired balance (sitting and/or standing);Decreased activity tolerance;Decreased safety awareness;Decreased knowledge of use of DME or AE      OT Treatment/Interventions: Self-care/ADL training;Manual therapy;Therapeutic exercise;Patient/family education;Neuromuscular education;Balance training;Energy conservation;Therapeutic activities;Cognitive remediation/compensation;DME and/or AE instruction    OT Goals(Current goals can be found in the care plan section) Acute Rehab OT Goals Patient Stated Goal: to go home OT Goal Formulation: With patient Time For Goal Achievement: 01/03/21 Potential to Achieve Goals: Fair ADL Goals Pt Will Perform Grooming: with modified independence;standing Pt Will Perform Lower Body Dressing: with modified independence;sit to/from stand Pt Will Transfer to Toilet: with modified independence;ambulating Pt Will Perform Toileting - Clothing Manipulation and hygiene: with modified independence;sit to/from stand  OT Frequency: Min 2X/week   Barriers to D/C:    none known at this time          AM-PAC OT "6 Clicks" Daily Activity     Outcome Measure Help from another person eating meals?: None Help from another person taking care of personal grooming?: None Help from another person  toileting, which includes using toliet, bedpan, or urinal?: A Little Help from another person bathing (including washing, rinsing, drying)?: A Little Help from another person to put on and taking off regular upper body clothing?: None Help from another person to put on and taking off regular lower body clothing?: A Little 6 Click Score: 21   End of Session Nurse Communication: Mobility status  Activity Tolerance: Patient tolerated treatment well Patient left: in chair;with call bell/phone within reach  OT Visit Diagnosis: Muscle weakness (generalized) (M62.81);Unsteadiness on feet (R26.81)                Time: 1030-1103 OT Time Calculation (min): 33 min Charges:  OT General Charges $OT Visit: 1 Visit OT Evaluation $OT Eval Low Complexity: 1 Low OT Treatments $Self Care/Home Management : 23-37 mins  Darleen Crocker, MS, OTR/L , CBIS ascom [phone]  12/20/20, 12:59 PM

## 2020-12-20 NOTE — Progress Notes (Signed)
Initial Nutrition Assessment  DOCUMENTATION CODES:  Not applicable  INTERVENTION:   Continue current diet as ordered  Ensure Enlive po BID, each supplement provides 350 kcal and 20 grams of protein  NUTRITION DIAGNOSIS:  Inadequate oral intake related to altered GI function (nausea, bloating) as evidenced by per patient/family report.  GOAL:  Patient will meet greater than or equal to 90% of their needs  MONITOR:  PO intake,Supplement acceptance  REASON FOR ASSESSMENT:  Malnutrition Screening Tool    ASSESSMENT:  Pt presented to ED for worsening abdominal pain associated with bloating, nausea, vomiting and poor oral intake. Pt reports she was unable to pay for medication that was prescribed for SIBO. PMH relevant for recently diagnosed SIBO, GERD, HTN, and dyslipidemia. Found to have UTI in ED.  Pt resting in bedside chair at the time of visit. Pt reports that she has had a good appetite today and has eaten more than she has in several weeks. Pt reports that she is currently not having any bloating and has not been nauseated since yesterday. Reports that she had shingles ~2 months ago and has been trying to recover her strength and maintain weight since that time. Does endorse weight loss from her usual of about 134 lb.   Discussed nutrition supplements and their usefulness in regaining weight. Pt states she is lactose intolerant but agreeable to ensure after hearing they were lactose free.   Relevant Scheduled Meds: . ascorbic acid  1,000 mg Oral Daily  . dicyclomine  10 mg Oral TID AC & HS  . docusate sodium  200 mg Oral BID  . pantoprazole (PROTONIX) IV  40 mg Intravenous Q24H  . [START ON 12/25/2020] Vitamin D (Ergocalciferol)  50,000 Units Oral Weekly   Relevant Continuous Infusions: . 0.9 % NaCl with KCl 40 mEq / L 75 mL/hr at 12/20/20 0408  . cefTRIAXone (ROCEPHIN)  IV 1 g (12/20/20 1211)   Relevant PRN Meds: ondansetron  Labs reviewed  NUTRITION - FOCUSED PHYSICAL  EXAM: Flowsheet Row Most Recent Value  Orbital Region No depletion  Upper Arm Region No depletion  Thoracic and Lumbar Region No depletion  Buccal Region No depletion  Temple Region Mild depletion  Clavicle Bone Region Mild depletion  Clavicle and Acromion Bone Region Mild depletion  Scapular Bone Region No depletion  Dorsal Hand No depletion  Patellar Region No depletion  Anterior Thigh Region No depletion  Posterior Calf Region No depletion  Edema (RD Assessment) None  Hair Reviewed  Eyes Reviewed  Mouth Reviewed  Skin Reviewed  Nails Reviewed     Diet Order:   Diet Order            DIET SOFT Room service appropriate? Yes; Fluid consistency: Thin  Diet effective now                 EDUCATION NEEDS:  No education needs have been identified at this time  Skin:  Skin Assessment: Reviewed RN Assessment  Last BM:  5/14 per RN documentation  Height:  Ht Readings from Last 1 Encounters:  12/19/20 5\' 6"  (1.676 m)    Weight:  Wt Readings from Last 1 Encounters:  12/19/20 56.6 kg    Ideal Body Weight:  59.1 kg  BMI:  Body mass index is 20.13 kg/m.  Estimated Nutritional Needs:   Kcal:  1500-1700 kcal/d  Protein:  75-85 g/d  Fluid:  >1516mL/d   Ranell Patrick, RD, LDN Clinical Dietitian Pager on [place]

## 2020-12-20 NOTE — Progress Notes (Signed)
Mobility Specialist - Progress Note   12/20/20 1255  Mobility  Activity Ambulated to bathroom;Transferred:  Bed to chair  Level of Assistance Standby assist, set-up cues, supervision of patient - no hands on  Assistive Device None (IV POLE)  Distance Ambulated (ft) 25 ft  Mobility Ambulated with assistance in room  Mobility Response Tolerated well  Mobility performed by Mobility specialist  $Mobility charge 1 Mobility    Pt ambulated from bathroom - chair with supervision. ModI to perform peri-care and hand hygiene. Noted small, loose BM droppings on floor; mobility cleaned floor for safe ambulation. No LOB pushing IV pole. Pt agreed to sit in recliner after toilet-use.   Kathee Delton Mobility Specialist 12/20/20, 1:01 PM

## 2020-12-20 NOTE — Care Management Obs Status (Signed)
[facility] NOTIFICATION   Patient Details  Name: Dawn Mcintyre MRN: [id number] Date of Birth: 08/01/1933   Medicare Observation Status Notification Given:  Yes    Beverly Sessions, RN 12/20/2020, 2:25 PM

## 2020-12-20 NOTE — Progress Notes (Signed)
PROGRESS NOTE    Dawn Mcintyre  YIF:[id number] DOB: [birth year] DOA: 12/19/2020 PCP: Idelle Crouch, MD    Assessment & Plan:   Principal Problem:   Abdominal pain Active Problems:   Anxiety   CKD stage 3 due to type 2 diabetes mellitus (HCC)   HTN (hypertension)   Acute lower UTI   Hypokalemia   Hypomagnesemia   Small intestinal bacterial overgrowth (SIBO)   Abd pain: etiology unclear, possibly secondary to small intestinal bacterial overgrowth found on EGD approx 2 weeks ago. Continue on IVFs, rifaximin. Pt can use bactrim 160/80 BID x 10 days can be used as an alternative as per GI. Continue on zofran and PPI.   Possible UTI: UA is positive, urine cx ordered. Continue on IV ceftriaxone  Hypokalemia: WNL today   Hypomagnesemia: will monitor intermittently   DM2: well controlled. Continue on SSI w/ accuchecks  CKDIIIa: Cr is trending down from day prior. GFR is currently > 60  HTN: continue on losartan, metoprolol   Anxiety: severity unknown. Continue on home dose of xanax      DVT prophylaxis: lovenox  Code Status: full  Family Communication: discussed pt's care w/ pt's son via phone and answered his questions  Disposition Plan: likely d/c back home   Level of care: Med-Surg  Status is: Inpatient  Remains inpatient appropriate because:Inpatient level of care appropriate due to severity of illness   Dispo: The patient is from: Home              Anticipated d/c is to: Home              Patient currently is not medically stable to d/c.   Difficult to place patient : unclear        Consultants:      Procedures:    Antimicrobials: rifaximin    Subjective: Pt c/o abd bloating   Objective: Vitals:   12/19/20 2019 12/20/20 0059 12/20/20 0434 12/20/20 0727  BP: 138/68 (!) 111/49 (!) 115/54 138/64  Pulse: 88 71 64 70  Resp: 18 18 16 18   Temp: 98.2 F (36.8 C) 98.7 F (37.1 C) 99.1 F (37.3 C) 98.4 F (36.9 C)  TempSrc: Oral Oral Oral  Oral  SpO2: 98% 93% 96% 96%  Weight:      Height:        Intake/Output Summary (Last 24 hours) at 12/20/2020 0837 Last data filed at 12/20/2020 0345 Gross per 24 hour  Intake 1136.38 ml  Output 450 ml  Net 686.38 ml   Filed Weights   12/19/20 0907  Weight: 56.6 kg    Examination:  General exam: Appears calm and comfortable  Respiratory system: Clear to auscultation. Respiratory effort normal. Cardiovascular system: S1 & S2+. No  rubs, gallops or clicks. Gastrointestinal system: Abdomen is nondistended, soft and nontender. Normal bowel sounds heard. Central nervous system: Alert and oriented. Moves all extremities  Psychiatry: Judgement and insight appear normal. Anxious mood and affect.     Data Reviewed: I have personally reviewed following labs and imaging studies  CBC: Recent Labs  Lab 12/19/20 0915 12/20/20 0451  WBC 5.9 5.3  HGB 12.5 10.4*  HCT 37.2 31.0*  MCV 96.4 96.3  PLT 234 867   Basic Metabolic Panel: Recent Labs  Lab 12/19/20 0915 12/19/20 1324 12/20/20 0451  NA 138  --  140  K 2.6*  --  3.7  CL 102  --  112*  CO2 25  --  24  GLUCOSE 137*  --  91  BUN 12  --  8  CREATININE 0.99  --  0.75  CALCIUM 9.8  --  9.1  MG  --  1.2*  --    GFR: Estimated Creatinine Clearance: 45.9 mL/min (by C-G formula based on SCr of 0.75 mg/dL). Liver Function Tests: Recent Labs  Lab 12/19/20 0915  AST 19  ALT 7  ALKPHOS 60  BILITOT 0.9  PROT 6.8  ALBUMIN 4.1   Recent Labs  Lab 12/19/20 0915  LIPASE 34   No results for input(s): AMMONIA in the last 168 hours. Coagulation Profile: No results for input(s): INR, PROTIME in the last 168 hours. Cardiac Enzymes: No results for input(s): CKTOTAL, CKMB, CKMBINDEX, TROPONINI in the last 168 hours. BNP (last 3 results) No results for input(s): PROBNP in the last 8760 hours. HbA1C: No results for input(s): HGBA1C in the last 72 hours. CBG: Recent Labs  Lab 12/19/20 1639 12/19/20 2017 12/20/20 0741   GLUCAP 103* 101* 83   Lipid Profile: No results for input(s): CHOL, HDL, LDLCALC, TRIG, CHOLHDL, LDLDIRECT in the last 72 hours. Thyroid Function Tests: No results for input(s): TSH, T4TOTAL, FREET4, T3FREE, THYROIDAB in the last 72 hours. Anemia Panel: No results for input(s): VITAMINB12, FOLATE, FERRITIN, TIBC, IRON, RETICCTPCT in the last 72 hours. Sepsis Labs: No results for input(s): PROCALCITON, LATICACIDVEN in the last 168 hours.  Recent Results (from the past 240 hour(s))  Resp Panel by RT-PCR (Flu A&B, Covid) Nasopharyngeal Swab     Status: None   Collection Time: 12/19/20  1:24 PM   Specimen: Nasopharyngeal Swab; Nasopharyngeal(NP) swabs in vial transport medium  Result Value Ref Range Status   SARS Coronavirus 2 by RT PCR NEGATIVE NEGATIVE Final    Comment: (NOTE) SARS-CoV-2 target nucleic acids are NOT DETECTED.  The SARS-CoV-2 RNA is generally detectable in upper respiratory specimens during the acute phase of infection. The lowest concentration of SARS-CoV-2 viral copies this assay can detect is 138 copies/mL. A negative result does not preclude SARS-Cov-2 infection and should not be used as the sole basis for treatment or other patient management decisions. A negative result may occur with  improper specimen collection/handling, submission of specimen other than nasopharyngeal swab, presence of viral mutation(s) within the areas targeted by this assay, and inadequate number of viral copies(<138 copies/mL). A negative result must be combined with clinical observations, patient history, and epidemiological information. The expected result is Negative.  Fact Sheet for Patients:  EntrepreneurPulse.com.au  Fact Sheet for Healthcare Providers:  IncredibleEmployment.be  This test is no t yet approved or cleared by the [place] FDA and  has been authorized for detection and/or diagnosis of SARS-CoV-2 by FDA under an Emergency  Use Authorization (EUA). This EUA will remain  in effect (meaning this test can be used) for the duration of the COVID-19 declaration under Section 564(b)(1) of the Act, 21 U.S.C.section 360bbb-3(b)(1), unless the authorization is terminated  or revoked sooner.       Influenza A by PCR NEGATIVE NEGATIVE Final   Influenza B by PCR NEGATIVE NEGATIVE Final    Comment: (NOTE) The Xpert Xpress SARS-CoV-2/FLU/RSV plus assay is intended as an aid in the diagnosis of influenza from Nasopharyngeal swab specimens and should not be used as a sole basis for treatment. Nasal washings and aspirates are unacceptable for Xpert Xpress SARS-CoV-2/FLU/RSV testing.  Fact Sheet for Patients: EntrepreneurPulse.com.au  Fact Sheet for Healthcare Providers: IncredibleEmployment.be  This test is not yet approved or cleared by the [place] and  has been authorized for detection and/or diagnosis of SARS-CoV-2 by FDA under an Emergency Use Authorization (EUA). This EUA will remain in effect (meaning this test can be used) for the duration of the COVID-19 declaration under Section 564(b)(1) of the Act, 21 U.S.C. section 360bbb-3(b)(1), unless the authorization is terminated or revoked.  Performed at [facility], [address], [place], [place] [address]          Radiology Studies: CT ABDOMEN PELVIS WO CONTRAST  Result Date: 12/19/2020 CLINICAL DATA:  87 year old female with left lower quadrant abdominal pain. EXAM: CT ABDOMEN AND PELVIS WITHOUT CONTRAST TECHNIQUE: Multidetector CT imaging of the abdomen and pelvis was performed following the standard protocol without IV contrast. COMPARISON:  11/26/2020 FINDINGS: Lower chest: No acute abnormality. Hepatobiliary: No focal liver abnormality is seen. No gallstones, gallbladder wall thickening, or biliary dilatation. Pancreas: Unremarkable. No pancreatic ductal dilatation or surrounding inflammatory  changes. Spleen: Normal in size without focal abnormality. Adrenals/Urinary Tract: Adrenal glands are unremarkable. Kidneys are normal, without renal calculi, focal lesion, or hydronephrosis. Bladder is unremarkable. Stomach/Bowel: Unchanged small hiatal hernia. Stomach is within otherwise normal limits. Appendix appears normal. Similar appearing scattered descending and sigmoid colonic diverticula without surrounding inflammatory changes. No evidence of bowel wall thickening, distention, or inflammatory changes. Vascular/Lymphatic: Aortic atherosclerosis. No enlarged abdominal or pelvic lymph nodes. Reproductive: Status post hysterectomy. No adnexal masses. Other: No abdominal wall hernia or abnormality. No abdominopelvic ascites. Musculoskeletal: No acute or significant osseous findings. IMPRESSION: 1. No acute abnormality in the abdomen or pelvis to explain left lower quadrant pain. 2. Unchanged descending and sigmoid colonic diverticula, no evidence of diverticulitis. 3. Unchanged small hiatal hernia. 4.  Aortic Atherosclerosis (ICD10-I70.0). Electronically Signed   By: Ruthann Cancer MD   On: 12/19/2020 10:25        Scheduled Meds: . ascorbic acid  1,000 mg Oral Daily  . aspirin EC  81 mg Oral Daily  . conjugated estrogens  1 Applicatorful Vaginal Once per day on Mon Wed Fri  . dicyclomine  10 mg Oral TID AC & HS  . enoxaparin (LOVENOX) injection  40 mg Subcutaneous Q24H  . gabapentin  300 mg Oral TID  . isosorbide mononitrate  60 mg Oral Daily  . loratadine  10 mg Oral Daily  . losartan  100 mg Oral Daily  . metoprolol tartrate  100 mg Oral BID  . mirabegron ER  25 mg Oral Daily  . pantoprazole (PROTONIX) IV  40 mg Intravenous Q24H  . rifaximin  550 mg Oral BID  . [START ON 12/25/2020] Vitamin D (Ergocalciferol)  50,000 Units Oral Weekly   Continuous Infusions: . 0.9 % NaCl with KCl 40 mEq / L 75 mL/hr at 12/20/20 0408  . cefTRIAXone (ROCEPHIN)  IV       LOS: 0 days    Time spent:  23 mins     Wyvonnia Dusky, MD [facility] Pager [phone]  If 7PM-7AM, please contact night-coverage 12/20/2020, 8:37 AM

## 2020-12-20 NOTE — TOC Initial Note (Signed)
Transition of Care [facility]) - Initial/Assessment Note    Patient Details  Name: Dawn Mcintyre MRN: [id number] Date of Birth: 29-Jul-1933  Transition of Care [facility]) CM/SW Contact:    Beverly Sessions, RN Phone Number: 12/20/2020, 2:34 PM  Clinical Narrative:                 Patient admitted from home with abdominal/vaginal pain Patient states that she lives at home alone  PCP [place] - Denies issues obtaining medications  Patient states at baseline she is able to drive herself if she is feeling well.  If not then she states she has a friend, cousin, and niece that is able to take her places, or pick up her medication  PT and OT have recommended home health.  Patient in agreement and states that she has no preference of home health.  Referral made to [facility] with [place] [place] checking availability.    Expected Discharge Plan: [place] Barriers to Discharge: Continued Medical Work up   Patient Goals and CMS Choice        Expected Discharge Plan and Services Expected Discharge Plan: [place]   Discharge Planning Services: CM Consult   Living arrangements for the past 2 months: Single Family Home                                      Prior Living Arrangements/Services Living arrangements for the past 2 months: Single Family Home Lives with:: Self Patient language and need for interpreter reviewed:: Yes Do you feel safe going back to the place where you live?: Yes      Need for Family Participation in Patient Care: Yes (Comment) Care giver support system in place?: Yes (comment) Current home services: DME Criminal Activity/Legal Involvement Pertinent to Current Situation/Hospitalization: No - Comment as needed  Activities of Daily Living Home Assistive Devices/Equipment: None ADL Screening (condition at time of admission) Patient's cognitive ability adequate to safely complete daily activities?:  Yes Is the patient deaf or have difficulty hearing?: No Does the patient have difficulty seeing, even when wearing glasses/contacts?: No Does the patient have difficulty concentrating, remembering, or making decisions?: No Patient able to express need for assistance with ADLs?: No Does the patient have difficulty dressing or bathing?: No Independently performs ADLs?: Yes (appropriate for developmental age) Does the patient have difficulty walking or climbing stairs?: No Weakness of Legs: Both Weakness of Arms/Hands: None  Permission Sought/Granted                  Emotional Assessment       Orientation: : Oriented to Self,Oriented to Place,Oriented to  Time Alcohol / Substance Use: Not Applicable Psych Involvement: No (comment)  Admission diagnosis:  Hypokalemia [E87.6] Abdominal pain [R10.9] Urinary tract infection without hematuria, site unspecified [N39.0] Left lower quadrant abdominal pain [R10.32] Patient Active Problem List   Diagnosis Date Noted  . Acute lower UTI 12/19/2020  . Hypokalemia 12/19/2020  . Hypomagnesemia 12/19/2020  . Small intestinal bacterial overgrowth (SIBO) 12/19/2020  . Abnormality of gait 12/04/2020  . Melena   . LLQ abdominal pain   . Post herpetic neuralgia   . Abdominal pain 11/26/2020  . Greater trochanteric bursitis of right hip 02/02/2019  . Diabetic neuropathy ([place]) 12/17/2018  . PAD (peripheral artery disease) ([place]) 10/05/2018  . Urinary incontinence in female 09/08/2018  .  Carotid stenosis, bilateral 12/24/2017  . Syncope and collapse 11/15/2017  . CAD (coronary artery disease) 11/15/2017  . Leg pain 10/26/2017  . Bilateral carotid artery disease ([place]) 07/11/2016  . Primary osteoarthritis involving multiple joints 07/11/2016  . Pure hypercholesterolemia 07/11/2016  . Carotid stenosis 07/01/2016  . Hyperlipidemia 07/01/2016  . Peripheral sensory neuropathy ([place]) 03/25/2016  . Type 2 diabetes mellitus with stage 3 chronic  kidney disease, without long-term current use of insulin ([place]) 03/25/2016  . Anxiety 11/24/2015  . Sensory peripheral neuropathy ([place]) 10/30/2015  . CKD stage 3 due to type 2 diabetes mellitus ([place]) 07/19/2015  . Osteopenia 07/19/2015  . Chronic insomnia 01/25/2015  . Diet-controlled type 2 diabetes mellitus ([place]) 01/25/2015  . Gastroesophageal reflux disease without esophagitis 01/25/2015  . Mixed stress and urge urinary incontinence 01/25/2015  . Atrophic vaginitis 11/16/2014  . Vitamin B 12 deficiency 10/26/2014  . Abdominal pain, chronic, epigastric 06/15/2014  . Essential hypertension 04/28/2014  . Sciatica of left side 04/07/2014  . HTN (hypertension) 01/28/2014  . Type 2 diabetes mellitus ([place]) 01/28/2014   PCP:  Idelle Crouch, MD Pharmacy:   [id number] [place], [address] [place] [address] Phone: [phone] Fax: [phone]     Social Determinants of Health (SDOH) Interventions    Readmission Risk Interventions No flowsheet data found.

## 2020-12-20 NOTE — TOC Benefit Eligibility Note (Signed)
PHARMACY -  BRIEF ELIGIBILITY NOTE   I have reviewed this patient's eligibility for Xifaxin with her insurance company and with Georgina Snell from [employer] [facility] office. The denial is not able to be over-ridden and is unconditional.  Unfortunately the only assistance available is a copay coupon card which will not help this patient. Additionally, a prescription discount card will not make the price affordable since the best available price is greater than $2000. I reviewed this  Information with Delma Post, PAC and he has recommended oral Bactrim 160/80 BID for 10 days as an alternative.                      Thank you,  Dallie Piles 12/20/2020  1:36 PM

## 2020-12-20 NOTE — Evaluation (Signed)
Physical Therapy Evaluation Patient Details Name: Dawn Mcintyre MRN: [id number] DOB: 1933/08/05 Today's Date: 12/20/2020   History of Present Illness  87 y.o. female with medical history significant for recently diagnosed small intestine bacterial overgrowth, GERD, hypertension and dyslipidemia who presents to the ER for evaluation of abdominal pain and UTI.  Pain is mostly in the lower abdomen, rated 10 x 10 in intensity at its worst and is constant.  She had an endoscopy done 2 weeks ago and was told she had small intestine bacterial overgrowth.   She admits to some weight loss but is unable to tell me how much weight she has lost.  She also complains of weakness, frequency of urination and dysuria.  Clinical Impression  Pt did well with PT and was safe and confident with mobility and circumambulating the nurses' station.  She does not typically need an AD, but did well with both walker and no UE support, though w/o walker she was slower and more guarded.  Pt's vitals were stable and appropriate, pt did not have excessive fatigue, though she does report not fully being at her normal and is open to the idea of HHPT once she discharges home.    Follow Up Recommendations Home health PT    Equipment Recommendations  None recommended by PT    Recommendations for Other Services       Precautions / Restrictions Precautions Precautions: Fall Restrictions Weight Bearing Restrictions: No      Mobility  Bed Mobility Overal bed mobility: Needs Assistance Bed Mobility: Supine to Sit     Supine to sit: Supervision;HOB elevated     General bed mobility comments: no physical assistance needed but min cuing for technique    Transfers Overall transfer level: Needs assistance Equipment used: Rolling walker (2 wheeled) Transfers: Sit to/from Stand Sit to Stand: Supervision Stand pivot transfers: Supervision       General transfer comment: Pt was able to rise to standing with no assist,  good confidence  Ambulation/Gait Ambulation/Gait assistance: Supervision Gait Distance (Feet): 200 Feet Assistive device: Rolling walker (2 wheeled);None       General Gait Details: Pt was able to ambulate with confident and consistent cadence.  Utilized walker for much of ambulation, pt was able to ambulate w/o AD/UEs with slightly slower but still safe and stable.  Stairs            Wheelchair Mobility    Modified Rankin (Stroke Patients Only)       Balance Overall balance assessment: Needs assistance Sitting-balance support: Feet supported Sitting balance-Leahy Scale: Good Sitting balance - Comments: no LOB   Standing balance support: No upper extremity supported Standing balance-Leahy Scale: Fair Standing balance comment: Pt was able to maintain standing balance safely and with good confidence                             Pertinent Vitals/Pain Pain Assessment: No/denies pain Pain Score: 6  Pain Location: painful urination Pain Descriptors / Indicators: Discomfort Pain Intervention(s): Limited activity within patient's tolerance;Repositioned;RN gave pain meds during session    Home Living Family/patient expects to be discharged to:: Private residence Living Arrangements: Alone Available Help at Discharge: Family;Available PRN/intermittently;Neighbor;Friend(s)   Home Access: Level entry     Home Layout: One level Home Equipment: Shower seat;Grab bars - toilet;Grab bars - tub/shower      Prior Function Level of Independence: Independent  Comments: Pt is I with ADLS, IADLs, drives, and is very active.     Hand Dominance   Dominant Hand: Right    Extremity/Trunk Assessment   Upper Extremity Assessment Upper Extremity Assessment: Generalized weakness    Lower Extremity Assessment Lower Extremity Assessment: Generalized weakness       Communication   Communication: No difficulties  Cognition Arousal/Alertness:  Awake/alert Behavior During Therapy: WFL for tasks assessed/performed Overall Cognitive Status: Within Functional Limits for tasks assessed                                 General Comments: very pleasant and cooperative. A & O x4      General Comments      Exercises     Assessment/Plan    PT Assessment Patient needs continued PT services  PT Problem List Decreased strength;Decreased range of motion;Decreased activity tolerance;Decreased balance;Decreased safety awareness;Decreased knowledge of use of DME;Decreased mobility;Cardiopulmonary status limiting activity       PT Treatment Interventions DME instruction;Gait training;Functional mobility training;Therapeutic activities;Therapeutic exercise;Balance training;Neuromuscular re-education;Patient/family education    PT Goals (Current goals can be found in the Care Plan section)  Acute Rehab PT Goals Patient Stated Goal: to go home PT Goal Formulation: With patient Time For Goal Achievement: 01/03/21 Potential to Achieve Goals: Good    Frequency Min 2X/week   Barriers to discharge        Co-evaluation               AM-PAC PT "6 Clicks" Mobility  Outcome Measure Help needed turning from your back to your side while in a flat bed without using bedrails?: None Help needed moving from lying on your back to sitting on the side of a flat bed without using bedrails?: None Help needed moving to and from a bed to a chair (including a wheelchair)?: None Help needed standing up from a chair using your arms (e.g., wheelchair or bedside chair)?: None Help needed to walk in hospital room?: None Help needed climbing 3-5 steps with a railing? : A Little 6 Click Score: 23    End of Session Equipment Utilized During Treatment: Gait belt Activity Tolerance: Patient tolerated treatment well Patient left: with call bell/phone within reach;with chair alarm set Nurse Communication: Mobility status PT Visit Diagnosis:  Muscle weakness (generalized) (M62.81);Difficulty in walking, not elsewhere classified (R26.2)    Time: 4656-8127 PT Time Calculation (min) (ACUTE ONLY): 19 min   Charges:   PT Evaluation $PT Eval Low Complexity: 1 Low          Kreg Shropshire, DPT 12/20/2020, 1:45 PM

## 2020-12-21 DIAGNOSIS — K6389 Other specified diseases of intestine: Secondary | ICD-10-CM

## 2020-12-21 LAB — URINE CULTURE: Culture: NO GROWTH

## 2020-12-21 LAB — MAGNESIUM: Magnesium: 1.4 mg/dL — ABNORMAL LOW (ref 1.7–2.4)

## 2020-12-21 LAB — BASIC METABOLIC PANEL
Anion gap: 4 — ABNORMAL LOW (ref 5–15)
BUN: 9 mg/dL (ref 8–23)
CO2: 22 mmol/L (ref 22–32)
Calcium: 9.2 mg/dL (ref 8.9–10.3)
Chloride: 117 mmol/L — ABNORMAL HIGH (ref 98–111)
Creatinine, Ser: 0.85 mg/dL (ref 0.44–1.00)
GFR, Estimated: >60 mL/min (ref >60)
Glucose, Bld: 94 mg/dL (ref 70–99)
Potassium: 4.1 mmol/L (ref 3.5–5.1)
Sodium: 143 mmol/L (ref 135–145)

## 2020-12-21 LAB — CBC
HCT: 30.9 % — ABNORMAL LOW (ref 36.0–46.0)
Hemoglobin: 10.2 g/dL — ABNORMAL LOW (ref 12.0–15.0)
MCH: 32.3 pg (ref 26.0–34.0)
MCHC: 33 g/dL (ref 30.0–36.0)
MCV: 97.8 fL (ref 80.0–100.0)
Platelets: 171 10*3/uL (ref 150–400)
RBC: 3.16 MIL/uL — ABNORMAL LOW (ref 3.87–5.11)
RDW: 14.2 % (ref 11.5–15.5)
WBC: 5.1 10*3/uL (ref 4.0–10.5)
nRBC: 0 % (ref 0.0–0.2)

## 2020-12-21 MED ORDER — SULFAMETHOXAZOLE-TRIMETHOPRIM 800-160 MG PO TABS: 1.0000 | ORAL_TABLET | Freq: Two times a day (BID) | ORAL | 0 refills | Status: AC

## 2020-12-21 MED ORDER — MAGNESIUM SULFATE 2 GM/50ML IV SOLN
2.0000 g | Freq: Once | INTRAVENOUS | Status: AC
  Administered 2020-12-21: 2 g via INTRAVENOUS
  Filled 2020-12-21: qty 50

## 2020-12-21 NOTE — Progress Notes (Signed)
[facility]) Hospital Liaison note:   This patient is currently enrolled in [facility] outpatient-based Palliative Care. Will continue to follow for disposition.  Please call with any outpatient palliative questions or concerns.  Thank you, Lorelee Market, LPN [facility] Liaison [phone]

## 2020-12-21 NOTE — TOC Progression Note (Signed)
Transition of Care [facility]) - Progression Note    Patient Details  Name: Dawn Mcintyre MRN: [id number] Date of Birth: 1933/01/26  Transition of Care [facility]) CM/SW Contact  Beverly Sessions, RN Phone Number: 12/21/2020, 9:59 AM  Clinical Narrative:     [facility] unable to accept [place] with [facility] has accepted for home health PT and OT Patient updated   Expected Discharge Plan: [place] Barriers to Discharge: Continued Medical Work up  Expected Discharge Plan and Services Expected Discharge Plan: [place]   Discharge Planning Services: CM Consult   Living arrangements for the past 2 months: Single Family Home                                       Social Determinants of Health (SDOH) Interventions    Readmission Risk Interventions No flowsheet data found.

## 2020-12-21 NOTE — Progress Notes (Signed)
Patient discharged to home with all pertinent information, prescriptions and personal belongings. Patient able to verbalize understanding of discharge instructions with teach back.  IV site previously discharge. No swelling noted at this time.  Patient awaiting wheelchair for discharge, no acute distress noted. Care relinquished.

## 2020-12-21 NOTE — TOC Transition Note (Signed)
Transition of Care [facility]) - CM/SW Discharge Note   Patient Details  Name: Dawn Mcintyre MRN: [id number] Date of Birth: Dec 15, 1932  Transition of Care [facility]) CM/SW Contact:  Beverly Sessions, RN Phone Number: 12/21/2020, 2:41 PM   Clinical Narrative:     Patient to discharge today [place] with [facility] notified of discharge   Final next level of care: [place] Barriers to Discharge: No Barriers Identified   Patient Goals and CMS Choice        Discharge Placement                       Discharge Plan and Services   Discharge Planning Services: CM Consult                      HH Arranged: PT,OT [facility] Agency: [place] Date [place] Agency Contacted: 12/21/20   Representative spoke with at [employer]: [place] Interventions     Readmission Risk Interventions No flowsheet data found.

## 2020-12-21 NOTE — Discharge Summary (Signed)
Physician Discharge Summary  Dawn Mcintyre [id number] DOB: [birth year] DOA: 12/19/2020  PCP: Idelle Crouch, MD  Admit date: 12/19/2020 Discharge date: 12/21/2020  Admitted From: home  Disposition: home w/ home health   Recommendations for Outpatient Follow-up:  1. Follow up with PCP in 1-2 weeks 2. F/u w/ GI, Dr. Alice Reichert, in 1-2 weeks   Home Health: yes Equipment/Devices:  Discharge Condition: stable  CODE STATUS: full  Diet recommendation: Heart Healthy / Carb Modified   Brief/Interim Summary: HPI was taken from Dr. Francine Graven: Dawn Mcintyre is a 87 y.o. female with medical history significant for recently diagnosed small intestine bacterial overgrowth, GERD, hypertension and dyslipidemia who presents to the ER for evaluation of abdominal pain.  Pain is mostly in the lower abdomen, rated 10 x 10 in intensity at its worst and is constant.  She had an endoscopy done 2 weeks ago and was told she had small intestine bacterial overgrowth.  She was prescribed rifaximin which she was unable to get from the pharmacy because her insurance will not pay for it.  Abdominal pain is associated with bloating, nausea, vomiting and poor oral intake.  She admits to some weight loss but is unable to tell me how much weight she has lost.  She also complains of weakness, frequency of urination and dysuria She denies having any chest pain, no shortness of breath, no dizziness, no lightheadedness, no fever, no chills, no cough, no headache, no constipation, no focal deficits. Labs show sodium 138, potassium 2.6, chloride 102, bicarb 25, glucose 137, BUN 12, creatinine 0.9, calcium 9.8, magnesium 1.2, alkaline phosphatase 60, albumin 4.1, lipase 34, AST 19, ALT 7, total protein 6.8, white count 5.9, hemoglobin 12.5, hematocrit 37.2, MCV 96.4, RDW 13.9, platelet count 234 Urine analysis shows positive nitrite and many bacteria. CT scan of abdomen and pelvis shows  no acute abnormality in the abdomen or pelvis  to explain left lower quadrant pain. Unchanged descending and sigmoid colonic diverticula, no evidence of diverticulitis. Unchanged small hiatal hernia.Aortic Atherosclerosis  Twelve-lead EKG reviewed by me shows sinus rhythm with minimal ST depressions in the lateral leads.  ED Course: Patient is an 87 year old female who presents to the ER for evaluation of pain mostly in the lower abdomen associated with nausea, vomiting, bloating and poor oral intake. She had an upper endoscopy which showed a small hiatal hernia patient states that she was also diagnosed with small intestine bacterial overgrowth for which she was prescribed Xifaxan. Patient was unable to get this medication from the pharmacy refused to pay for it. Labs show hypokalemia, hypomagnesemia she also has pyuria. She will be referred to observation status for further evaluation.   Hospital course from Dr. Jimmye Norman 5/18-5/19/22: Pt presented w/ abd pain possibly secondary to intestinal bacterial overgrowth found on EGD approx 2 weeks ago. Pt was started on rifaximin as per GI's previous recommendation and then switched to po bactrim at d/c as per GI as pt's insurance would not pay for rifaximin. Pt's abd pain had resolved prior to d/c as per pt. For more information, please see previous progress/consult notes.   Discharge Diagnoses:  Principal Problem:   Abdominal pain Active Problems:   Anxiety   CKD stage 3 due to type 2 diabetes mellitus (HCC)   HTN (hypertension)   Acute lower UTI   Hypokalemia   Hypomagnesemia   Small intestinal bacterial overgrowth (SIBO)  Abd pain: etiology unclear, possibly secondary to small intestinal bacterial overgrowth found on EGD approx 2 weeks  ago. Continue on rifaximin while inpatient only. Pt can use bactrim 160/800 BID x 10 days can be used as an alternative as per GI. Continue on zofran and PPI.   Unlikely UTI: UA is positive, urine cx showed no growth  Hypokalemia: WNL today    Hypomagnesemia: Mg sulfate given. Will continue to monitor   DM2: well controlled. Continue on SSI w/ accuchecks  CKDIIIa: Cr is labile. GFR is currently > 60  HTN: continue on losartan, metoprolol   Anxiety: severity unknown. Continue on home dose of xanax   Discharge Instructions  Discharge Instructions    Diet - low sodium heart healthy   Complete by: As directed    Discharge instructions   Complete by: As directed    F/u w/ GI, Dr. Alice Reichert, in 1-2 weeks. F/u w/ PCP in 1-2 weeks   Increase activity slowly   Complete by: As directed      Allergies as of 12/21/2020      Reactions   Amoxicillin-pot Clavulanate Other (See Comments)   Unknown reaction Has patient had a PCN reaction causing immediate rash, facial/tongue/throat swelling, SOB or lightheadedness with hypotension: Unknown Has patient had a PCN reaction causing severe rash involving mucus membranes or skin necrosis: Unknown Has patient had a PCN reaction that required hospitalization: Unknown Has patient had a PCN reaction occurring within the last 10 years: No If all of the above answers are "NO", then may proceed with Cephalosporin use.   Codeine Hives   Hydrocodone Nausea Only   Metronidazole Nausea Only   Propoxyphene Nausea Only   Latex Rash      Medication List    STOP taking these medications   rifaximin 550 MG Tabs tablet Commonly known as: XIFAXAN     TAKE these medications   acetaminophen 500 MG tablet Commonly known as: TYLENOL Take 500 mg by mouth daily as needed for headache.   ALPRAZolam 0.25 MG tablet Commonly known as: XANAX Take 0.25 mg by mouth at bedtime as needed for sleep.   ascorbic acid 1000 MG tablet Commonly known as: VITAMIN C Take 1,000 mg by mouth daily.   aspirin 81 MG tablet Take 81 mg by mouth daily.   cloNIDine 0.1 MG tablet Commonly known as: CATAPRES Take 0.1 mg by mouth at bedtime.   felodipine 2.5 MG 24 hr tablet Commonly known as: PLENDIL Take 2.5 mg by  mouth daily. May take a second 2.5 mg dose as needed if bp is over 150/90   fexofenadine 180 MG tablet Commonly known as: ALLEGRA Take 180 mg by mouth daily as needed for allergies.   fluticasone 50 MCG/ACT nasal spray Commonly known as: FLONASE Place into the nose.   gabapentin 300 MG capsule Commonly known as: NEURONTIN Take 1 capsule (300 mg total) by mouth 3 (three) times daily.   hydrochlorothiazide 12.5 MG tablet Commonly known as: HYDRODIURIL Take 1 tablet by mouth daily.   isosorbide mononitrate 60 MG 24 hr tablet Commonly known as: IMDUR Take 60 mg by mouth daily.   lidocaine 5 % Commonly known as: [place] onto the skin.   losartan 100 MG tablet Commonly known as: COZAAR Take 1 tablet by mouth daily.   metoprolol tartrate 100 MG tablet Commonly known as: LOPRESSOR Take 100 mg by mouth 2 (two) times daily.   mirabegron ER 25 MG Tb24 tablet Commonly known as: MYRBETRIQ Take by mouth.   nitrofurantoin (macrocrystal-monohydrate) 100 MG capsule Commonly known as: MACROBID Take 1 capsule (100 mg total)  by mouth 2 (two) times daily.   ondansetron 4 MG tablet Commonly known as: ZOFRAN Take 4 mg by mouth every 8 (eight) hours as needed.   oxyCODONE-acetaminophen 5-325 MG tablet Commonly known as: PERCOCET/ROXICET Take by mouth.   pantoprazole 40 MG tablet Commonly known as: Protonix Take 1 tablet (40 mg total) by mouth 2 (two) times daily before a meal.   phenazopyridine 200 MG tablet Commonly known as: Pyridium Take 1 tablet (200 mg total) by mouth 3 (three) times daily as needed for pain (urethral spasm).   Premarin vaginal cream Generic drug: conjugated estrogens Apply one pea-sized amount around the opening of the urethra three times weekly.   sulfamethoxazole-trimethoprim 800-160 MG tablet Commonly known as: Bactrim DS Take 1 tablet by mouth 2 (two) times daily for 10 days.   traMADol 50 MG tablet Commonly known as: ULTRAM Take 50 mg by  mouth every 6 (six) hours as needed.   valACYclovir 1000 MG tablet Commonly known as: Valtrex Take 1 tablet (1,000 mg total) by mouth 3 (three) times daily.   Vitamin D3 1.25 MG (50000 UT) Caps Take by mouth.   vitamin E 180 MG (400 UNITS) capsule Take 400 Units by mouth daily.       Allergies  Allergen Reactions  . Amoxicillin-Pot Clavulanate Other (See Comments)    Unknown reaction Has patient had a PCN reaction causing immediate rash, facial/tongue/throat swelling, SOB or lightheadedness with hypotension: Unknown Has patient had a PCN reaction causing severe rash involving mucus membranes or skin necrosis: Unknown Has patient had a PCN reaction that required hospitalization: Unknown Has patient had a PCN reaction occurring within the last 10 years: No If all of the above answers are "NO", then may proceed with Cephalosporin use.   . Codeine Hives  . Hydrocodone Nausea Only  . Metronidazole Nausea Only  . Propoxyphene Nausea Only  . Latex Rash    Consultations:     Procedures/Studies: CT ABDOMEN PELVIS WO CONTRAST  Result Date: 12/19/2020 CLINICAL DATA:  87 year old female with left lower quadrant abdominal pain. EXAM: CT ABDOMEN AND PELVIS WITHOUT CONTRAST TECHNIQUE: Multidetector CT imaging of the abdomen and pelvis was performed following the standard protocol without IV contrast. COMPARISON:  11/26/2020 FINDINGS: Lower chest: No acute abnormality. Hepatobiliary: No focal liver abnormality is seen. No gallstones, gallbladder wall thickening, or biliary dilatation. Pancreas: Unremarkable. No pancreatic ductal dilatation or surrounding inflammatory changes. Spleen: Normal in size without focal abnormality. Adrenals/Urinary Tract: Adrenal glands are unremarkable. Kidneys are normal, without renal calculi, focal lesion, or hydronephrosis. Bladder is unremarkable. Stomach/Bowel: Unchanged small hiatal hernia. Stomach is within otherwise normal limits. Appendix appears normal.  Similar appearing scattered descending and sigmoid colonic diverticula without surrounding inflammatory changes. No evidence of bowel wall thickening, distention, or inflammatory changes. Vascular/Lymphatic: Aortic atherosclerosis. No enlarged abdominal or pelvic lymph nodes. Reproductive: Status post hysterectomy. No adnexal masses. Other: No abdominal wall hernia or abnormality. No abdominopelvic ascites. Musculoskeletal: No acute or significant osseous findings. IMPRESSION: 1. No acute abnormality in the abdomen or pelvis to explain left lower quadrant pain. 2. Unchanged descending and sigmoid colonic diverticula, no evidence of diverticulitis. 3. Unchanged small hiatal hernia. 4.  Aortic Atherosclerosis (ICD10-I70.0). Electronically Signed   By: Ruthann Cancer MD   On: 12/19/2020 10:25   CT HEAD WO CONTRAST  Result Date: 12/06/2020 CLINICAL DATA:  Pain following recent fall EXAM: CT HEAD WITHOUT CONTRAST TECHNIQUE: Contiguous axial images were obtained from the base of the skull through the vertex without intravenous  contrast. COMPARISON:  March 11, 2019 FINDINGS: Brain: Age related volume loss is stable. There is no appreciable intracranial mass, hemorrhage, extra-axial fluid collection, or midline shift. There is patchy decreased attenuation in the centra semiovale bilaterally. No appreciable acute infarct. Vascular: No hyperdense vessels. There is calcification in the distal right vertebral artery and in each carotid siphon region. Skull: Bony calvarium appears intact. Sinuses/Orbits: There is mucosal thickening in several ethmoid air cells. Other visualized paranasal sinuses are clear. Visualized orbits appear symmetric bilaterally. Other: Mastoid air cells are clear. IMPRESSION: Age related volume loss with periventricular small vessel disease. No acute infarct evident. No mass or hemorrhage. There are foci of arterial vascular calcification. There is mild mucosal thickening in several ethmoid air cells.  Electronically Signed   By: Lowella Grip III M.D.   On: 12/06/2020 12:55   CT ABDOMEN PELVIS W CONTRAST  Result Date: 11/26/2020 CLINICAL DATA:  Left lower quadrant abdominal pain, melena and diarrhea. EXAM: CT ABDOMEN AND PELVIS WITH CONTRAST TECHNIQUE: Multidetector CT imaging of the abdomen and pelvis was performed using the standard protocol following bolus administration of intravenous contrast. CONTRAST:  124mL OMNIPAQUE IOHEXOL 300 MG/ML  SOLN COMPARISON:  November 06, 2020 and November 04, 2020. FINDINGS: Lower chest: Hypoventilatory change in the dependent lungs. Cardiac enlargement. No significant pericardial effusion/thickening. Hepatobiliary: Again seen is a small hypervascular focus in the central liver on image 12/2 which is unchanged dating back to 08/02/2013 and considered benign likely a intrahepatic shunt versus hemangioma. No additional hepatic lesions. Gallbladder is grossly unremarkable. No biliary ductal dilation. Pancreas: Similar mild diffuse pancreatic ductal dilation measuring 3-4 mm in diameter, this is chronic and not substantially changed dating back to 2014. No discrete pancreatic masses or evidence of acute inflammation. Spleen: Normal in size without focal abnormality. Adrenals/Urinary Tract: Bilateral adrenal glands are unremarkable. No hydronephrosis. No solid enhancing renal lesions. Urinary bladder is grossly unremarkable for degree of distension. Stomach/Bowel: Small hiatal hernia otherwise unremarkable appearance of the none distended stomach. No suspicious small bowel wall thickening. Scattered small bowel diverticula without evidence of acute diverticulosis. The appendix is not discretely visualized however there is no pericecal inflammation. Left-sided colonic diverticulosis without findings of acute diverticulitis. Vascular/Lymphatic: Aortic atherosclerosis. No abdominal aortic aneurysm. No enlarged abdominal or pelvic lymph nodes. Reproductive: Status post hysterectomy. No  adnexal masses. Other: No pneumoperitoneum.  No abdominopelvic ascites. Musculoskeletal: Multilevel degenerative change of the spine. No acute osseous abnormality. IMPRESSION: 1. No acute no acute abnormality in the abdomen or pelvis. Specifically no evidence of bowel obstruction or acute bowel inflammation. 2. Left-sided colonic diverticulosis and a few small bowel diverticula without evidence of acute diverticulitis. 3. Small hiatal hernia. 4. Aortic atherosclerosis. Aortic Atherosclerosis (ICD10-I70.0). Electronically Signed   By: Dahlia Bailiff MD   On: 11/26/2020 16:01       Subjective: Pt c/o malaise    Discharge Exam: Vitals:   12/21/20 0823 12/21/20 1126  BP: 129/61 (!) 114/46  Pulse: 66 (!) 56  Resp: 18 20  Temp: 98.6 F (37 C) 97.6 F (36.4 C)  SpO2: 92% 100%   Vitals:   12/20/20 2355 12/21/20 0423 12/21/20 0823 12/21/20 1126  BP: (!) 125/53 (!) 118/50 129/61 (!) 114/46  Pulse: 72 71 66 (!) 56  Resp: 18 18 18 20   Temp: 98.8 F (37.1 C) 98.7 F (37.1 C) 98.6 F (37 C) 97.6 F (36.4 C)  TempSrc: Oral Oral Oral Oral  SpO2: 95% 92% 92% 100%  Weight:  Height:        General: Pt is alert, awake, not in acute distress Cardiovascular: S1/S2 +, no rubs, no gallops Respiratory: CTA bilaterally, no wheezing, no rhonchi Abdominal: Soft, NT, ND, bowel sounds + Extremities: no edema, no cyanosis    The results of significant diagnostics from this hospitalization (including imaging, microbiology, ancillary and laboratory) are listed below for reference.     Microbiology: Recent Results (from the past 240 hour(s))  Resp Panel by RT-PCR (Flu A&B, Covid) Nasopharyngeal Swab     Status: None   Collection Time: 12/19/20  1:24 PM   Specimen: Nasopharyngeal Swab; Nasopharyngeal(NP) swabs in vial transport medium  Result Value Ref Range Status   SARS Coronavirus 2 by RT PCR NEGATIVE NEGATIVE Final    Comment: (NOTE) SARS-CoV-2 target nucleic acids are NOT  DETECTED.  The SARS-CoV-2 RNA is generally detectable in upper respiratory specimens during the acute phase of infection. The lowest concentration of SARS-CoV-2 viral copies this assay can detect is 138 copies/mL. A negative result does not preclude SARS-Cov-2 infection and should not be used as the sole basis for treatment or other patient management decisions. A negative result may occur with  improper specimen collection/handling, submission of specimen other than nasopharyngeal swab, presence of viral mutation(s) within the areas targeted by this assay, and inadequate number of viral copies(<138 copies/mL). A negative result must be combined with clinical observations, patient history, and epidemiological information. The expected result is Negative.  Fact Sheet for Patients:  EntrepreneurPulse.com.au  Fact Sheet for Healthcare Providers:  IncredibleEmployment.be  This test is no t yet approved or cleared by the [place] FDA and  has been authorized for detection and/or diagnosis of SARS-CoV-2 by FDA under an Emergency Use Authorization (EUA). This EUA will remain  in effect (meaning this test can be used) for the duration of the COVID-19 declaration under Section 564(b)(1) of the Act, 21 U.S.C.section 360bbb-3(b)(1), unless the authorization is terminated  or revoked sooner.       Influenza A by PCR NEGATIVE NEGATIVE Final   Influenza B by PCR NEGATIVE NEGATIVE Final    Comment: (NOTE) The Xpert Xpress SARS-CoV-2/FLU/RSV plus assay is intended as an aid in the diagnosis of influenza from Nasopharyngeal swab specimens and should not be used as a sole basis for treatment. Nasal washings and aspirates are unacceptable for Xpert Xpress SARS-CoV-2/FLU/RSV testing.  Fact Sheet for Patients: EntrepreneurPulse.com.au  Fact Sheet for Healthcare Providers: IncredibleEmployment.be  This test is not yet  approved or cleared by the [place] FDA and has been authorized for detection and/or diagnosis of SARS-CoV-2 by FDA under an Emergency Use Authorization (EUA). This EUA will remain in effect (meaning this test can be used) for the duration of the COVID-19 declaration under Section 564(b)(1) of the Act, 21 U.S.C. section 360bbb-3(b)(1), unless the authorization is terminated or revoked.  Performed at [facility], [address], [place], [place] [address]   Urine Culture     Status: None   Collection Time: 12/20/20 11:50 AM   Specimen: Urine, Random  Result Value Ref Range Status   Specimen Description   Final    URINE, RANDOM Performed at [facility], [address] [place], [place] [address]    Special Requests   Final    NONE Performed at [facility], [address] [place], [place] [address]    Culture   Final    NO GROWTH Performed at [facility], [place] [address],  [place], [place] [address]    Report Status 12/21/2020 FINAL  Final     Labs: BNP (last 3 results) No results for input(s): BNP in the last 8760 hours. Basic Metabolic Panel: Recent Labs  Lab 12/19/20 0915 12/19/20 1324 12/20/20 0451 12/21/20 0411  NA 138  --  140 143  K 2.6*  --  3.7 4.1  CL 102  --  112* 117*  CO2 25  --  24 22  GLUCOSE 137*  --  91 94  BUN 12  --  8 9  CREATININE 0.99  --  0.75 0.85  CALCIUM 9.8  --  9.1 9.2  MG  --  1.2*  --  1.4*   Liver Function Tests: Recent Labs  Lab 12/19/20 0915  AST 19  ALT 7  ALKPHOS 60  BILITOT 0.9  PROT 6.8  ALBUMIN 4.1   Recent Labs  Lab 12/19/20 0915  LIPASE 34   No results for input(s): AMMONIA in the last 168 hours. CBC: Recent Labs  Lab 12/19/20 0915 12/20/20 0451 12/21/20 0411  WBC 5.9 5.3 5.1  HGB 12.5 10.4* 10.2*  HCT 37.2 31.0* 30.9*  MCV 96.4 96.3 97.8  PLT 234 201 171   Cardiac Enzymes: No results for input(s): CKTOTAL, CKMB, CKMBINDEX, TROPONINI in the last 168  hours. BNP: Invalid input(s): POCBNP CBG: Recent Labs  Lab 12/19/20 1639 12/19/20 2017 12/20/20 0741 12/20/20 1546 12/20/20 2317  GLUCAP 103* 101* 83 113* 107*   D-Dimer No results for input(s): DDIMER in the last 72 hours. Hgb A1c No results for input(s): HGBA1C in the last 72 hours. Lipid Profile No results for input(s): CHOL, HDL, LDLCALC, TRIG, CHOLHDL, LDLDIRECT in the last 72 hours. Thyroid function studies No results for input(s): TSH, T4TOTAL, T3FREE, THYROIDAB in the last 72 hours.  Invalid input(s): FREET3 Anemia work up No results for input(s): VITAMINB12, FOLATE, FERRITIN, TIBC, IRON, RETICCTPCT in the last 72 hours. Urinalysis    Component Value Date/Time   COLORURINE AMBER (A) 12/19/2020 1120   APPEARANCEUR CLEAR (A) 12/19/2020 1120   APPEARANCEUR Cloudy (A) 05/18/2019 1404   LABSPEC 1.008 12/19/2020 1120   LABSPEC 1.026 09/12/2011 2304   PHURINE 6.0 12/19/2020 1120   GLUCOSEU NEGATIVE 12/19/2020 1120   GLUCOSEU Negative 09/12/2011 2304   HGBUR NEGATIVE 12/19/2020 1120   BILIRUBINUR NEGATIVE 12/19/2020 1120   BILIRUBINUR Negative 05/18/2019 1404   BILIRUBINUR Negative 09/12/2011 2304   KETONESUR 5 (A) 12/19/2020 1120   PROTEINUR NEGATIVE 12/19/2020 1120   UROBILINOGEN 0.2 05/17/2016 0904   NITRITE POSITIVE (A) 12/19/2020 1120   LEUKOCYTESUR NEGATIVE 12/19/2020 1120   LEUKOCYTESUR 2+ 09/12/2011 2304   Sepsis Labs Invalid input(s): PROCALCITONIN,  WBC,  LACTICIDVEN Microbiology Recent Results (from the past 240 hour(s))  Resp Panel by RT-PCR (Flu A&B, Covid) Nasopharyngeal Swab     Status: None   Collection Time: 12/19/20  1:24 PM   Specimen: Nasopharyngeal Swab; Nasopharyngeal(NP) swabs in vial transport medium  Result Value Ref Range Status   SARS Coronavirus 2 by RT PCR NEGATIVE NEGATIVE Final    Comment: (NOTE) SARS-CoV-2 target nucleic acids are NOT DETECTED.  The SARS-CoV-2 RNA is generally detectable in upper respiratory specimens during  the acute phase of infection. The lowest concentration of SARS-CoV-2 viral copies this assay can detect is 138 copies/mL. A negative result does not preclude SARS-Cov-2 infection and should not be used as the sole basis for treatment or other patient management decisions. A negative result may occur with  improper specimen  collection/handling, submission of specimen other than nasopharyngeal swab, presence of viral mutation(s) within the areas targeted by this assay, and inadequate number of viral copies(<138 copies/mL). A negative result must be combined with clinical observations, patient history, and epidemiological information. The expected result is Negative.  Fact Sheet for Patients:  EntrepreneurPulse.com.au  Fact Sheet for Healthcare Providers:  IncredibleEmployment.be  This test is no t yet approved or cleared by the [place] FDA and  has been authorized for detection and/or diagnosis of SARS-CoV-2 by FDA under an Emergency Use Authorization (EUA). This EUA will remain  in effect (meaning this test can be used) for the duration of the COVID-19 declaration under Section 564(b)(1) of the Act, 21 U.S.C.section 360bbb-3(b)(1), unless the authorization is terminated  or revoked sooner.       Influenza A by PCR NEGATIVE NEGATIVE Final   Influenza B by PCR NEGATIVE NEGATIVE Final    Comment: (NOTE) The Xpert Xpress SARS-CoV-2/FLU/RSV plus assay is intended as an aid in the diagnosis of influenza from Nasopharyngeal swab specimens and should not be used as a sole basis for treatment. Nasal washings and aspirates are unacceptable for Xpert Xpress SARS-CoV-2/FLU/RSV testing.  Fact Sheet for Patients: EntrepreneurPulse.com.au  Fact Sheet for Healthcare Providers: IncredibleEmployment.be  This test is not yet approved or cleared by the [place] FDA and has been authorized for detection and/or  diagnosis of SARS-CoV-2 by FDA under an Emergency Use Authorization (EUA). This EUA will remain in effect (meaning this test can be used) for the duration of the COVID-19 declaration under Section 564(b)(1) of the Act, 21 U.S.C. section 360bbb-3(b)(1), unless the authorization is terminated or revoked.  Performed at [facility], [address], [place], [place] [address]   Urine Culture     Status: None   Collection Time: 12/20/20 11:50 AM   Specimen: Urine, Random  Result Value Ref Range Status   Specimen Description   Final    URINE, RANDOM Performed at [facility], [address] [place], [place] [address]    Special Requests   Final    NONE Performed at [facility], [address], [place], [place] [address]    Culture   Final    NO GROWTH Performed at [facility], [place] [address], [place], [place] [address]    Report Status 12/21/2020 FINAL  Final     Time coordinating discharge: Over 30 minutes  SIGNED:   Wyvonnia Dusky, MD  [facility] 12/21/2020, 2:33 PM Pager   If 7PM-7AM, please contact night-coverage

## 2021-01-16 ENCOUNTER — Other Ambulatory Visit: Payer: Medicare Other | Admitting: Primary Care

## 2021-01-16 DIAGNOSIS — B0229 Other postherpetic nervous system involvement: Secondary | ICD-10-CM

## 2021-01-16 DIAGNOSIS — R1032 Left lower quadrant pain: Secondary | ICD-10-CM

## 2021-01-16 DIAGNOSIS — Z515 Encounter for palliative care: Secondary | ICD-10-CM

## 2021-01-16 DIAGNOSIS — R269 Unspecified abnormalities of gait and mobility: Secondary | ICD-10-CM

## 2021-01-16 NOTE — Progress Notes (Signed)
[facility] Consult Note Telephone: [phone]  Fax: [phone]    Date of encounter: 01/16/21 PATIENT NAME: Dawn Mcintyre [address] [place] [address]   [phone] (home)  DOB: [birth year] MRN: [id number] PRIMARY CARE PROVIDER:    Idelle Crouch, MD,  [address] [place] [address] [phone]  REFERRING PROVIDER:   Idelle Crouch, MD [facility] [place],  [place] [address] [phone]  RESPONSIBLE PARTY:    Contact Information     Name Relation Home Work Mobile   Tague,Timothy Son [phone]     Leeann Must Sister [phone]     Simon Rhein   [phone]        I met face to face with patient in her  home. Palliative Care was asked to follow this patient by consultation request of  Sparks, Leonie Douglas, MD to address advance care planning and complex medical decision making. This is a follow up visit.                                   ASSESSMENT AND PLAN / RECOMMENDATIONS:   Advance Care Planning/Goals of Care: Goals include to maximize quality of life and symptom management. Our advance care planning conversation included a discussion about:    The value and importance of advance care planning  Experiences with loved ones who have been seriously ill or have died  Exploration of personal, cultural or spiritual beliefs that might influence medical decisions  Exploration of goals of care in the event of a sudden injury or illness  Identification of a healthcare agent - son Review  of an  advance directive document . CODE STATUS: We discussed MOST and ACP at our last visit. However she has not completed this yet. She wanted to discuss with her son. I asked her to find her advance care directives to review and next visit we will discuss her choices for advance care planning.  She does want to use to discuss with her son. We  discussed living situation, possibly moving closer to her son, as her sister is.   I spent 16 minutes providing this consultation. More than 50% of the time in this consultation was spent in counseling and care coordination.  --------------------------------------------------------------------------------------------------------------------------------------------------------------------------------------------  Symptom Management/Plan:  Abdominal pain: I met with patient in her home. She's much improved from our last visit in early May, however she did have a hospital trip whereby she had severe abdominal pain and went to the hospital. CT abdominal Imaging was negative on 12/1720. She did have a low potassium., 2.6.  It seem to have resolved with treatment. She was not able to fill the order for xifaxan due to cost. However she has had no more pain.   Care burden: She is looking for a caregiver to hire and recognizes that she has limited stamina and ability for ADLs. She states she is tired and not able to complete her ADLs alone. She also fears falling and being isolated. We discussed her potentially moving closer to her son in [place]. She doesn't think she's ready yet but her sister is moving to [place] to be near her son and so this possibility appears to be more discussable.   She is pursuing claim for her long term care insurance.   Postherpetic neuralgia:She continues to complain of this. She is on  the gabapentin and states 300 mg is too strong. She only takes at hs due to dizziness.  I would recommend 100 mg Q8 hours for the neuralgia rather than the one dose only at bedtime. Refuses shingrex b/c she thinks she got shingles from it. Follows with Dr. Wallace Kernodle.   Nutrition is fair she has early satiety but improved from several months ago. Recently had ED for extreme abdominal pain. Had decreased potassium (2.6) at the time, corrected. Was suggested to have xifaxin but could not afford.  Appetite is fair and has lost weight. Had early satiety and bloat. Weight 125 lbs, normal was 135 lbs. Has stopped losing but eats small frequent meals Does not take nutritional supplements;  I recommended a  lactose free supplement which she will pick up.  Mobility: Endorses difficulty but no falls. She walks with ataxia but does not want to use a mobility device. She has 2 canes but declined to use as she walked to mail box and back. She does have a risk for falls and is currently having home PT services.   Follow up Palliative Care Visit: Palliative care will continue to follow for complex medical decision making, advance care planning, and clarification of goals. Return 6 weeks or prn.  PPS: 50%  HOSPICE ELIGIBILITY/DIAGNOSIS: TBD  Chief Complaint: Post herpetic neuralgia  HISTORY OF PRESENT ILLNESS:  Dawn Mcintyre is a 87 y.o. year old female  with Post herpetic neuralgia, gait instability,  unintended weight loss, debility. She presents with ongoing pain from zoster infection x 3 months. She uses some OTC and 300 mg gabapentin at hs but does not like side effects. She had one shingrex immunization but declines second.  History obtained from review of EMR, discussion with primary team, and interview with family, facility staff/caregiver and/or Dawn Mcintyre.  I reviewed available labs, medications, imaging, studies and related documents from the EMR.  Records reviewed and summarized above.   ROS   General: NAD EYES: denies vision changes ENMT: denies dysphagia Cardiovascular: denies chest pain, endorses mild DOE Pulmonary: denies cough, endorses occ  increased SOB Abdomen: endorses fair appetite, denies constipation, endorses continence of bowel GU: denies dysuria, endorses continence of urine MSK:  endorses general weakness,  no falls reported Skin: denies rashes or wounds- zoster healed Neurological: endorses pain, endorses insomnia Psych: Endorses positive mood, reports fear of  being alone at night. Heme/lymph/immuno: denies bruises, abnormal bleeding  Physical Exam: Current and past weights: 125 lb reported Constitutional: NAD General: frail appearing, thin EYES: anicteric sclera, lids intact, no discharge  ENMT: intact hearing, oral mucous membranes moist, dentition intact CV: no LE edema Pulmonary:  no increased work of breathing, no cough, room air Abdomen: intake 50%, no ascites GU: deferred MSK: severe  sarcopenia, moves all extremities, ambulatory Skin: warm and dry, no rashes or wounds on visible skin Neuro:  + generalized weakness,  no cognitive impairment Psych: non-anxious affect, A and O x 3 Hem/lymph/immuno: no widespread bruising   This visit was coded based on medical decision making (MDM).   Thank you for the opportunity to participate in the care of Dawn Mcintyre.  The palliative care team will continue to follow. Please call our office at [phone] if we can be of additional assistance.   Kathryn McKelvey Smith, NP , DNP, MPH, AGPCNP-BC, ACHPN  COVID-19 PATIENT SCREENING TOOL Asked and negative response unless otherwise noted:   Have you had symptoms of covid, tested positive or been in contact with someone with   symptoms/positive test in the past 5-10 days?

## 2021-02-16 ENCOUNTER — Ambulatory Visit: Payer: Medicare Other

## 2021-03-14 ENCOUNTER — Other Ambulatory Visit: Payer: Self-pay | Admitting: Physical Medicine & Rehabilitation

## 2021-03-14 DIAGNOSIS — M5441 Lumbago with sciatica, right side: Secondary | ICD-10-CM

## 2021-03-14 DIAGNOSIS — M5442 Lumbago with sciatica, left side: Secondary | ICD-10-CM

## 2021-03-16 ENCOUNTER — Other Ambulatory Visit: Payer: Self-pay

## 2021-03-16 ENCOUNTER — Ambulatory Visit
Admission: RE | Admit: 2021-03-16 | Discharge: 2021-03-16 | Disposition: A | Discharge status: Home / Self Care | Payer: Medicare Other | Source: Ambulatory Visit | Attending: Internal Medicine | Admitting: Internal Medicine

## 2021-03-16 DIAGNOSIS — Z1231 Encounter for screening mammogram for malignant neoplasm of breast: Secondary | ICD-10-CM | POA: Insufficient documentation

## 2021-03-20 ENCOUNTER — Other Ambulatory Visit: Payer: Medicare Other | Admitting: Primary Care

## 2021-03-27 ENCOUNTER — Other Ambulatory Visit: Payer: Medicare Other | Admitting: Primary Care

## 2021-03-27 ENCOUNTER — Encounter: Payer: Self-pay | Admitting: Primary Care

## 2021-03-27 ENCOUNTER — Other Ambulatory Visit: Payer: Self-pay

## 2021-03-27 DIAGNOSIS — M150 Primary generalized (osteo)arthritis: Secondary | ICD-10-CM

## 2021-03-27 DIAGNOSIS — M159 Polyosteoarthritis, unspecified: Secondary | ICD-10-CM

## 2021-03-27 DIAGNOSIS — I739 Peripheral vascular disease, unspecified: Secondary | ICD-10-CM

## 2021-03-27 DIAGNOSIS — M8949 Other hypertrophic osteoarthropathy, multiple sites: Secondary | ICD-10-CM

## 2021-03-27 DIAGNOSIS — R269 Unspecified abnormalities of gait and mobility: Secondary | ICD-10-CM

## 2021-03-27 DIAGNOSIS — M7061 Trochanteric bursitis, right hip: Secondary | ICD-10-CM

## 2021-03-27 DIAGNOSIS — G608 Other hereditary and idiopathic neuropathies: Secondary | ICD-10-CM

## 2021-03-27 DIAGNOSIS — Z515 Encounter for palliative care: Secondary | ICD-10-CM

## 2021-03-27 DIAGNOSIS — E1349 Other specified diabetes mellitus with other diabetic neurological complication: Secondary | ICD-10-CM

## 2021-03-27 NOTE — Progress Notes (Signed)
[facility] Consult Note Telephone: [phone]  Fax: [phone]    Date of encounter: 03/27/21 12:07 PM PATIENT NAME: Dawn Mcintyre [address] [place] [address]   [phone] (home)  DOB: [birth year] MRN: [id number] PRIMARY CARE PROVIDER:    Idelle Crouch, MD,  [address] [place] [address] [phone]  REFERRING PROVIDER:   Idelle Crouch, MD [facility] [place],  [place] [address] [phone]  RESPONSIBLE PARTY:    Contact Information     Name Relation Home Work Mobile   Eichinger,Timothy Son [phone]     Leeann Must Sister [phone]     Simon Rhein   [phone]       I met face to face with patient  in home. Palliative Care was asked to follow this patient by consultation request of  Sparks, Leonie Douglas, MD to address advance care planning and complex medical decision making. This is a follow up visit.  This visit is a face to face to determine medical need for DME. Order for lightweight rollator, wt 130 lbs, ht 5'6".  Dx  Multiple joints affected, arthritis M89.49 PAD I73.9 Diabetic Peripheral neuropathy E11.49 Abnormality of gait  R26.9 Sensory peripheral neuropathy G60.8                         ASSESSMENT AND PLAN / RECOMMENDATIONS:   Advance Care Planning/Goals of Care: Goals include to maximize quality of life and symptom management. Our advance care planning conversation included a discussion about:    The value and importance of advance care planning  Experiences with loved ones who have been seriously ill or have died  Exploration of personal, cultural or spiritual beliefs that might influence medical decisions  Exploration of goals of care in the event of a sudden injury or illness  Identification of a healthcare agent - Son is POA. Review of  an  advance directive document- Has MOST but has not discussed with  son. CODE STATUS: FULL CODE due to no directives. We have discussed need to do ACP. She has living will. She states it would be a good idea to discuss with her son. I encouraged them to discuss on his next visit.  Symptom Management/Plan:  Pain and immobility: Needs rollator for safety in ambulation and pain management. Pt weight is 130 lbs and height 5' 6" , has R sided pain and likely hip degeneration. MRI pending. Is a fall risk due to pain and ataxia.  She rates > 75 on Morse fall risk scale, > 50 denotes high risk of falling.  Endorses severe  LE bil  neuropathic pain. Also discussed Lyrica as she has not tolerated   increased doses of gabapentin.  She wishes to discuss with podiatrist.   Adls needs: Has been declined for LTC assistance per insurance; I will send a letter for pt to submit as an appeal. She has significant transferring needs and safety needs.She cannot rise safely due to pain  in hip and back, and deconditioning, and needs rollator to ambulate for balance. She reports occ that her knee gives way and she needs to sit down. I am also today ordering a rollator for her to use in her home and when she goes outside.   She also requires assistance for bathing and dressing to avoid falls and to maintain her in her home.  Nutrition: Endorses good appetite, albumin  is 4.0. Endorses weight gain back to her baseline.  Follow up Palliative Care Visit: Palliative care will continue to follow for complex medical decision making, advance care planning, and clarification of goals. Return 8 weeks or prn.  I spent 60 minutes providing this consultation. More than 50% of the time in this consultation was spent in counseling and care coordination.  PPS: 50%  HOSPICE ELIGIBILITY/DIAGNOSIS: TBD  Chief Complaint: chronic pain, immobility.  HISTORY OF PRESENT ILLNESS:  Dawn Mcintyre is a 87 y.o. year old female  with arthritis of multiple joints, chronic Right hip and leg pain, DM, diet  controlled,  HTN, peripheral sensory neuropathy, PAD. She presents today with worsening pain and increased limits to her adls ability. She has needs in bathing and dressing management, and in transferring and ambulation, and is a high falls risk.  History obtained from review of EMR, discussion with primary team, and interview with family, facility staff/caregiver and/or Dawn Mcintyre.  I reviewed available labs, medications, imaging, studies and related documents from the EMR.  Records reviewed and summarized above.   ROS  General: NAD EYES: denies vision changes ENMT: denies dysphagia Cardiovascular: denies chest pain, denies DOE Pulmonary: denies cough, denies increased SOB Abdomen: endorses good appetite, denies constipation, endorses continence of bowel GU: denies dysuria, endorses continence of urine MSK:  endorses  weakness,  no falls reported, pain with ambulation Skin: denies rashes or wounds Neurological: endorses moderate neuropathic pain, denies insomnia Psych: Endorses positive mood Heme/lymph/immuno: denies bruises, abnormal bleeding  Physical Exam: Current and past weights: 130 lbs Constitutional: NAD General: frail appearing, thin EYES: anicteric sclera, lids intact, no discharge  ENMT: intact hearing, oral mucous membranes moist, dentition intact CV:  no LE edema Pulmonary:  no increased work of breathing, no cough, room air Abdomen: intake 75%, no ascites GU: deferred MSK: ++ sarcopenia, moves all extremities, ambulatory with cane, needs walker, fall risk Skin: warm and dry, no rashes or wounds on visible skin Neuro:  ++ generalized weakness,  no cognitive impairment Psych: non-anxious affect, A and O x 3 Hem/lymph/immuno: no widespread bruising  Thank you for the opportunity to participate in the care of Dawn Mcintyre.  The palliative care team will continue to follow. Please call our office at [phone] if we can be of additional assistance.   Jason Coop, NP   COVID-19 PATIENT SCREENING TOOL Asked and negative response unless otherwise noted:   Have you had symptoms of covid, tested positive or been in contact with someone with symptoms/positive test in the past 5-10 days?

## 2021-03-29 ENCOUNTER — Ambulatory Visit
Admission: RE | Admit: 2021-03-29 | Discharge: 2021-03-29 | Disposition: A | Discharge status: Home / Self Care | Payer: Medicare Other | Source: Ambulatory Visit | Attending: Physical Medicine & Rehabilitation | Admitting: Physical Medicine & Rehabilitation

## 2021-03-29 ENCOUNTER — Other Ambulatory Visit: Payer: Self-pay

## 2021-03-29 DIAGNOSIS — M5441 Lumbago with sciatica, right side: Secondary | ICD-10-CM | POA: Insufficient documentation

## 2021-03-29 DIAGNOSIS — M5442 Lumbago with sciatica, left side: Secondary | ICD-10-CM | POA: Insufficient documentation

## 2021-04-12 ENCOUNTER — Encounter: Payer: Self-pay | Admitting: Family Medicine

## 2021-04-12 ENCOUNTER — Other Ambulatory Visit: Payer: Self-pay

## 2021-04-12 ENCOUNTER — Other Ambulatory Visit (HOSPITAL_COMMUNITY)
Admission: RE | Admit: 2021-04-12 | Discharge: 2021-04-12 | Disposition: A | Discharge status: Home / Self Care | Payer: Medicare Other | Source: Ambulatory Visit | Attending: Family Medicine | Admitting: Family Medicine

## 2021-04-12 ENCOUNTER — Ambulatory Visit: Payer: Medicare Other | Admitting: Family Medicine

## 2021-04-12 VITALS — BP 139/68 | HR 68 | Wt 127.0 lb

## 2021-04-12 DIAGNOSIS — R300 Dysuria: Secondary | ICD-10-CM

## 2021-04-12 DIAGNOSIS — N898 Other specified noninflammatory disorders of vagina: Secondary | ICD-10-CM | POA: Insufficient documentation

## 2021-04-12 LAB — POCT URINALYSIS DIPSTICK
Bilirubin, UA: NEGATIVE
Blood, UA: NEGATIVE
Glucose, UA: NEGATIVE
Ketones, UA: NEGATIVE
Leukocytes, UA: NEGATIVE
Nitrite, UA: NEGATIVE
Odor: NEGATIVE
Protein, UA: POSITIVE — AB
Spec Grav, UA: 1.01 (ref 1.010–1.025)
Urobilinogen, UA: 0.2 E.U./dL
pH, UA: 7 (ref 5.0–8.0)

## 2021-04-12 MED ORDER — FLUCONAZOLE 150 MG PO TABS: 150.0000 mg | ORAL_TABLET | Freq: Every day | ORAL | 2 refills | Status: DC

## 2021-04-12 NOTE — Progress Notes (Signed)
Pt c/o dysuria post void and vaginal discomfort x 3-4 days.

## 2021-04-13 NOTE — Progress Notes (Signed)
   Subjective:    Patient ID: Dawn Mcintyre is a 87 y.o. female presenting with vaginal irritation on 04/12/2021  HPI: Here with several day h/o vaginal discomfort. Also notes post void dysuria.  Review of Systems  Constitutional:  Negative for chills and fever.  Respiratory:  Negative for shortness of breath.   Cardiovascular:  Negative for chest pain.  Gastrointestinal:  Negative for abdominal pain, nausea and vomiting.  Genitourinary:  Positive for dysuria, vaginal discharge and vaginal pain.  Skin:  Negative for rash.     Objective:    BP 139/68   Pulse 68   Wt 127 lb (57.6 kg)   BMI 20.50 kg/m  Physical Exam Exam conducted with a chaperone present.  Constitutional:      General: She is not in acute distress.    Appearance: She is well-developed.  HENT:     Head: Normocephalic and atraumatic.  Eyes:     General: No scleral icterus. Cardiovascular:     Rate and Rhythm: Normal rate.  Pulmonary:     Effort: Pulmonary effort is normal.  Abdominal:     Palpations: Abdomen is soft.  Genitourinary:    Comments: Atrophic vagina, curd-like, thick white DC noted. Musculoskeletal:     Cervical back: Neck supple.  Skin:    General: Skin is warm and dry.  Neurological:     Mental Status: She is alert and oriented to person, place, and time.   Urinalysis          Assessment & Plan:  Dysuria - U/A is not completely consistent--will check culture and treat if needed. - Plan: POCT Urinalysis Dipstick, Urine Culture  Vaginal discharge - c/w yeast, will treat. - Plan: Cervicovaginal ancillary only( [facility]), fluconazole (DIFLUCAN) 150 MG tablet   Return if symptoms worsen or fail to improve.  [name] 04/13/2021 7:56 PM

## 2021-04-16 LAB — CERVICOVAGINAL ANCILLARY ONLY
Bacterial Vaginitis (gardnerella): NEGATIVE
Candida Glabrata: NEGATIVE
Candida Vaginitis: NEGATIVE
Comment: NEGATIVE
Comment: NEGATIVE
Comment: NEGATIVE

## 2021-04-17 ENCOUNTER — Telehealth: Payer: Self-pay | Admitting: *Deleted

## 2021-04-17 ENCOUNTER — Other Ambulatory Visit: Payer: Self-pay | Admitting: Family Medicine

## 2021-04-17 ENCOUNTER — Other Ambulatory Visit: Payer: Self-pay | Admitting: *Deleted

## 2021-04-17 MED ORDER — TERCONAZOLE 0.8 % VA CREA: 1.0000 | TOPICAL_CREAM | Freq: Every day | VAGINAL | 0 refills | Status: DC

## 2021-04-17 MED ORDER — FOSFOMYCIN TROMETHAMINE 3 G PO PACK: 3.0000 g | PACK | Freq: Once | ORAL | 0 refills | Status: DC

## 2021-04-17 NOTE — Addendum Note (Signed)
Addended by: [name] on: 04/17/2021 07:56 AM   Modules accepted: Orders

## 2021-04-17 NOTE — Telephone Encounter (Signed)
Left message for pt to call back  °

## 2021-04-17 NOTE — Telephone Encounter (Signed)
-----   Message from [name], MD sent at 04/17/2021  7:56 AM EDT ----- Can we find her urine culture--all her cultures were negative--form the vagina, I know she is still having symptoms Lets try terconazole--rx sent in--please call and let pt. Know. If not improved by later in the week, let me know

## 2021-04-19 LAB — URINE CULTURE

## 2021-05-15 ENCOUNTER — Other Ambulatory Visit: Payer: Medicare Other | Admitting: Primary Care

## 2021-05-15 ENCOUNTER — Other Ambulatory Visit: Payer: Self-pay

## 2021-05-17 ENCOUNTER — Other Ambulatory Visit: Payer: Medicare Other | Admitting: Primary Care

## 2021-05-17 ENCOUNTER — Other Ambulatory Visit: Payer: Self-pay

## 2021-06-06 ENCOUNTER — Other Ambulatory Visit: Payer: Self-pay | Admitting: Nephrology

## 2021-06-06 DIAGNOSIS — E119 Type 2 diabetes mellitus without complications: Secondary | ICD-10-CM

## 2021-06-06 DIAGNOSIS — N1831 Chronic kidney disease, stage 3a: Secondary | ICD-10-CM

## 2021-06-06 DIAGNOSIS — M1991 Primary osteoarthritis, unspecified site: Secondary | ICD-10-CM | POA: Insufficient documentation

## 2021-06-06 DIAGNOSIS — D649 Anemia, unspecified: Secondary | ICD-10-CM | POA: Insufficient documentation

## 2021-06-06 DIAGNOSIS — N189 Chronic kidney disease, unspecified: Secondary | ICD-10-CM | POA: Insufficient documentation

## 2021-06-11 ENCOUNTER — Other Ambulatory Visit: Payer: Medicare Other | Admitting: Primary Care

## 2021-06-11 ENCOUNTER — Other Ambulatory Visit: Payer: Self-pay

## 2021-06-13 ENCOUNTER — Other Ambulatory Visit: Payer: Medicare Other | Admitting: Primary Care

## 2021-06-13 ENCOUNTER — Other Ambulatory Visit: Payer: Self-pay

## 2021-06-13 DIAGNOSIS — I6523 Occlusion and stenosis of bilateral carotid arteries: Secondary | ICD-10-CM

## 2021-06-13 DIAGNOSIS — Z515 Encounter for palliative care: Secondary | ICD-10-CM

## 2021-06-13 DIAGNOSIS — I25118 Atherosclerotic heart disease of native coronary artery with other forms of angina pectoris: Secondary | ICD-10-CM

## 2021-06-13 NOTE — Progress Notes (Signed)
[facility] Consult Note Telephone: [phone]  Fax: [phone]    Date of encounter: 06/13/21 3:37 PM PATIENT NAME: Dawn Mcintyre [address] [place] [address]   [phone] (home)  DOB: August 17, 1932 MRN: [id number] PRIMARY CARE PROVIDER:    Idelle Crouch, MD,  [address] [place] [address] [phone]  REFERRING PROVIDER:   Idelle Crouch, MD [facility] [place],  [place] [address] [phone]  RESPONSIBLE PARTY:    Contact Information     Name Relation Home Work Mobile   Dunlow,Timothy Son [phone]     Milton Ferguson [phone]          I met face to face with patient in home . Palliative Care was asked to follow this patient by consultation request of  Sparks, Leonie Douglas, MD to address advance care planning and complex medical decision making. This is a follow up visit.                                   ASSESSMENT AND PLAN / RECOMMENDATIONS:   Advance Care Planning/Goals of Care: Goals include to maximize quality of life and symptom management. Our advance care planning conversation included a discussion about:     Experiences with loved ones who have been seriously ill or have died  Exploration of personal, cultural or spiritual beliefs that might influence medical decisions  Endorses wanting to feel as good as she can and stay active.  Able to get out and about and wants to maintain this. Son lives in [place] and is her [place]. She worries about him having to visit her such a distance. She has a nearby cousin as well.   Symptom Management/Plan:  BP Pt distressed about high bp last pm. She reports 631 systolic and took and extra clonidine and xanax. This am it was 497 systolic and at my exam 026/ 70. Denies feeling dizzy. Education RE recent change of d/c of HCTZ. Has call in to cardiology to discuss. May need several doses a week to  keep bp in range. Discussed her medications.I encouraged her to also change batteries in her bp machine to address any malfunctions.  Mobility: Much improved, able to ambulate now in the home freely, up stairs and is able to drive. States she is getting injection (epidural) in her hip in a week for pain but appear to have good gait and stability. May benefit from a cane when rising from sitting a long time. Has a lift chair which she uses occasionally.  Mood; Endorses it's good. Has had some family loss but appears to be grieving appropriately. 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 Finds meaning in helping others.   Follow up Palliative Care Visit: Palliative care will continue to follow for complex medical decision making, advance care planning, and clarification of goals. Return 8 weeks or prn.  I spent 60 minutes providing this consultation. More than 50% of the time in this consultation was spent in counseling and care coordination.  PPS: 50%  HOSPICE ELIGIBILITY/DIAGNOSIS: TBD  Chief Complaint: mobility  HISTORY OF PRESENT ILLNESS:  Uriel Horkey Clemson is a 87 y.o. year old female  with mobility decreases, HTN, h/o PAD .   History obtained from review of EMR, discussion with  primary team, and interview with family, facility staff/caregiver and/or Dawn Mcintyre.  I reviewed available labs, medications, imaging, studies and related documents from the EMR.  Records reviewed and summarized above.   ROS   General: NAD EYES: denies vision changes ENMT: denies dysphagia Cardiovascular: denies chest pain, denies DOE Pulmonary: denies cough, denies increased SOB Abdomen: endorses good appetite, denies constipation, endorses continence of bowel GU: denies dysuria, endorses continence of urine MSK:  denies  new weakness,  no falls reported Skin: denies rashes or wounds Neurological: denies pain, denies  insomnia Psych: Endorses positive mood Heme/lymph/immuno: denies bruises, abnormal bleeding  Physical Exam: Current and past weights: stable  Constitutional: NAD General: frail appearing, thin EYES: anicteric sclera, lids intact, no discharge  ENMT: intact hearing, oral mucous membranes moist, dentition intact CV: S1S2, RRR, no LE edema Pulmonary: LCTA, no increased work of breathing, no cough, room air Abdomen: intake 75%,  no ascites GU: deferred MSK: ++ sarcopenia, moves all extremities, ambulatory Skin: warm and dry, no rashes or wounds on visible skin Neuro:  moderate  generalized weakness,  no cognitive impairment Psych: non-anxious affect, A and O x 3 Hem/lymph/immuno: no widespread bruising   Thank you for the opportunity to participate in the care of Dawn Mcintyre.  The palliative care team will continue to follow. Please call our office at [phone] if we can be of additional assistance.   Jason Coop, NP   COVID-19 PATIENT SCREENING TOOL Asked and negative response unless otherwise noted:   Have you had symptoms of covid, tested positive or been in contact with someone with symptoms/positive test in the past 5-10 days?

## 2021-06-25 ENCOUNTER — Ambulatory Visit (INDEPENDENT_AMBULATORY_CARE_PROVIDER_SITE_OTHER): Payer: Medicare HMO | Admitting: Vascular Surgery

## 2021-06-25 ENCOUNTER — Encounter (INDEPENDENT_AMBULATORY_CARE_PROVIDER_SITE_OTHER): Payer: Medicare HMO

## 2021-07-20 ENCOUNTER — Other Ambulatory Visit (INDEPENDENT_AMBULATORY_CARE_PROVIDER_SITE_OTHER): Payer: Self-pay | Admitting: Vascular Surgery

## 2021-07-20 DIAGNOSIS — I6523 Occlusion and stenosis of bilateral carotid arteries: Secondary | ICD-10-CM

## 2021-07-23 ENCOUNTER — Encounter (INDEPENDENT_AMBULATORY_CARE_PROVIDER_SITE_OTHER): Payer: Self-pay | Admitting: Vascular Surgery

## 2021-07-23 ENCOUNTER — Ambulatory Visit (INDEPENDENT_AMBULATORY_CARE_PROVIDER_SITE_OTHER): Payer: Medicare Other | Admitting: Vascular Surgery

## 2021-07-23 ENCOUNTER — Other Ambulatory Visit: Payer: Self-pay

## 2021-07-23 ENCOUNTER — Ambulatory Visit (INDEPENDENT_AMBULATORY_CARE_PROVIDER_SITE_OTHER): Payer: Medicare Other

## 2021-07-23 VITALS — BP 145/76 | HR 61 | Ht 67.0 in | Wt 129.0 lb

## 2021-07-23 DIAGNOSIS — I6523 Occlusion and stenosis of bilateral carotid arteries: Secondary | ICD-10-CM | POA: Diagnosis not present

## 2021-07-23 DIAGNOSIS — I25118 Atherosclerotic heart disease of native coronary artery with other forms of angina pectoris: Secondary | ICD-10-CM | POA: Diagnosis not present

## 2021-07-23 DIAGNOSIS — I10 Essential (primary) hypertension: Secondary | ICD-10-CM

## 2021-07-23 DIAGNOSIS — I739 Peripheral vascular disease, unspecified: Secondary | ICD-10-CM | POA: Diagnosis not present

## 2021-07-23 DIAGNOSIS — E1159 Type 2 diabetes mellitus with other circulatory complications: Secondary | ICD-10-CM

## 2021-07-23 DIAGNOSIS — E782 Mixed hyperlipidemia: Secondary | ICD-10-CM

## 2021-07-30 ENCOUNTER — Encounter (INDEPENDENT_AMBULATORY_CARE_PROVIDER_SITE_OTHER): Payer: Self-pay | Admitting: Vascular Surgery

## 2021-07-30 NOTE — Progress Notes (Signed)
MRN : [id number]  Dawn Mcintyre is a 87 y.o. (04-13-1933) female who presents with chief complaint of check carotid.  History of Present Illness:  The patient is seen for follow up evaluation of carotid stenosis status post right carotid endarterectomy on 03/13/2018.  There were no post operative problems or complications related to the surgery.  The patient denies neck or incisional pain.   Right carotid endarterectomy was treated with primary closure   The patient denies interval amaurosis fugax. There is no recent history of TIA symptoms or focal motor deficits. There is no prior documented CVA.   The patient denies headache.   The patient is taking enteric-coated aspirin 81 mg daily.   The patient has a history of coronary artery disease, no recent episodes of angina or shortness of breath. The patient denies PAD or claudication symptoms. There is a history of hyperlipidemia which is being treated with a statin.    Carotid Duplex <40% bilateral ICA stenosis stable no significant change compared to last study.   Previous ABI Rt=1.11 and Lt=1.10 Triphasic  Current Meds  Medication Sig   acetaminophen (TYLENOL) 500 MG tablet Take 500 mg by mouth daily as needed for headache.   ALPRAZolam (XANAX) 0.25 MG tablet Take 0.25 mg by mouth at bedtime as needed for sleep.   ascorbic acid (VITAMIN C) 1000 MG tablet Take 1,000 mg by mouth daily.   aspirin 81 MG tablet Take 81 mg by mouth daily.   Cholecalciferol (VITAMIN D3) 1.25 MG (50000 UT) CAPS Take by mouth.   cloNIDine (CATAPRES) 0.1 MG tablet Take 0.1 mg by mouth at bedtime.   conjugated estrogens (PREMARIN) vaginal cream Apply one pea-sized amount around the opening of the urethra three times weekly.   felodipine (PLENDIL) 2.5 MG 24 hr tablet Take 2.5 mg by mouth daily. May take a second 2.5 mg dose as needed if bp is over 150/90   fexofenadine (ALLEGRA) 180 MG tablet Take 180 mg by mouth daily as needed for allergies.     fluconazole (DIFLUCAN) 150 MG tablet Take 1 tablet (150 mg total) by mouth daily. Repeat in 24 hours if needed   fluticasone (FLONASE) 50 MCG/ACT nasal spray Place into the nose.   hydrochlorothiazide (HYDRODIURIL) 12.5 MG tablet Take 1 tablet by mouth daily.   isosorbide mononitrate (IMDUR) 60 MG 24 hr tablet Take 60 mg by mouth daily.   losartan (COZAAR) 100 MG tablet Take 1 tablet by mouth daily.   metoprolol tartrate (LOPRESSOR) 100 MG tablet Take 100 mg by mouth 2 (two) times daily.   mirabegron ER (MYRBETRIQ) 25 MG TB24 tablet Take by mouth.   nitrofurantoin, macrocrystal-monohydrate, (MACROBID) 100 MG capsule Take 1 capsule (100 mg total) by mouth 2 (two) times daily.   ondansetron (ZOFRAN) 4 MG tablet Take 4 mg by mouth every 8 (eight) hours as needed.   oxyCODONE-acetaminophen (PERCOCET/ROXICET) 5-325 MG tablet Take by mouth.   phenazopyridine (PYRIDIUM) 200 MG tablet Take 1 tablet (200 mg total) by mouth 3 (three) times daily as needed for pain (urethral spasm).   terconazole (TERAZOL 3) 0.8 % vaginal cream Place 1 applicator vaginally at bedtime.   traMADol (ULTRAM) 50 MG tablet Take 50 mg by mouth every 6 (six) hours as needed.   valACYclovir (VALTREX) 1000 MG tablet Take 1 tablet (1,000 mg total) by mouth 3 (three) times daily.   vitamin E 180 MG (400 UNITS) capsule Take 400 Units by mouth daily.    Past Medical History:  Diagnosis Date   Arteritis ([place])    Arthritis    Atrophic vaginitis 11/16/2014   Carotid artery stenosis    Cataract    Difficult intubation    Environmental and seasonal allergies    GERD (gastroesophageal reflux disease)    Hyperlipidemia    Hypertension    Lack of bladder control    Neuropathy    Osteoporosis    Pneumonia     x 20yrs ago   Pre-diabetes    Shingles    Ulcer     Past Surgical History:  Procedure Laterality Date   ABDOMINAL HYSTERECTOMY  1972   BREAST CYST EXCISION Right 1972   BREAST CYST EXCISION Left 1972   ENDARTERECTOMY  Left 12/24/2017   Procedure: ENDARTERECTOMY CAROTID;  Surgeon: Katha Cabal, MD;  Location: [facility];  Service: Vascular;  Laterality: Left;   ENDARTERECTOMY Right 03/13/2018   Procedure: ENDARTERECTOMY CAROTID;  Surgeon: Katha Cabal, MD;  Location: [facility];  Service: Vascular;  Laterality: Right;   ESOPHAGOGASTRODUODENOSCOPY (EGD) WITH PROPOFOL N/A 11/28/2020   Procedure: ESOPHAGOGASTRODUODENOSCOPY (EGD) WITH PROPOFOL;  Surgeon: Lucilla Lame, MD;  Location: [facility];  Service: Endoscopy;  Laterality: N/A;   EYE SURGERY     THROAT SURGERY     x 10 yrs ago. throat mass.   TONSILLECTOMY      Social History Social History   Tobacco Use   Smoking status: Never   Smokeless tobacco: Never  Vaping Use   Vaping Use: Never used  Substance Use Topics   Alcohol use: No    Alcohol/week: 0.0 standard drinks   Drug use: No    Family History Family History  Problem Relation Age of Onset   Anuerysm Mother    Cancer Father        prostrate cancer   Diabetes Brother    Hearing loss Brother    Bladder Cancer Neg Hx    Kidney cancer Neg Hx    Breast cancer Neg Hx     Allergies  Allergen Reactions   Amoxicillin-Pot Clavulanate Other (See Comments)    Unknown reaction Has patient had a PCN reaction causing immediate rash, facial/tongue/throat swelling, SOB or lightheadedness with hypotension: Unknown Has patient had a PCN reaction causing severe rash involving mucus membranes or skin necrosis: Unknown Has patient had a PCN reaction that required hospitalization: Unknown Has patient had a PCN reaction occurring within the last 10 years: No If all of the above answers are "NO", then may proceed with Cephalosporin use.    Codeine Hives   Hydrocodone Nausea Only   Metronidazole Nausea Only   Propoxyphene Nausea Only   Sulfa Antibiotics Hives   Latex Rash     REVIEW OF SYSTEMS (Negative unless checked)  Constitutional: [] Weight loss  [] Fever  [] Chills Cardiac:  [] Chest pain   [] Chest pressure   [] Palpitations   [] Shortness of breath when laying flat   [] Shortness of breath with exertion. Vascular:  [] Pain in legs with walking   [] Pain in legs at rest  [] History of DVT   [] Phlebitis   [] Swelling in legs   [] Varicose veins   [] Non-healing ulcers Pulmonary:   [] Uses home oxygen   [] Productive cough   [] Hemoptysis   [] Wheeze  [] COPD   [] Asthma Neurologic:  [] Dizziness   [] Seizures   [] History of stroke   [] History of TIA  [] Aphasia   [] Vissual changes   [] Weakness or numbness in arm   [] Weakness or numbness in leg Musculoskeletal:   [] Joint swelling   []   Joint pain   [] Low back pain Hematologic:  [] Easy bruising  [] Easy bleeding   [] Hypercoagulable state   [] Anemic Gastrointestinal:  [] Diarrhea   [] Vomiting  [] Gastroesophageal reflux/heartburn   [] Difficulty swallowing. Genitourinary:  [] Chronic kidney disease   [] Difficult urination  [] Frequent urination   [] Blood in urine Skin:  [] Rashes   [] Ulcers  Psychological:  [] History of anxiety   []  History of major depression.  Physical Examination  Vitals:   07/23/21 1403  BP: (!) 145/76  Pulse: 61  Weight: 129 lb (58.5 kg)  Height: 5\' 7"  (1.702 m)   Body mass index is 20.2 kg/m. Gen: WD/WN, NAD Head: [place]/AT, No temporalis wasting.  Ear/Nose/Throat: Hearing grossly intact, nares w/o erythema or drainage Eyes: PER, EOMI, sclera nonicteric.  Neck: Supple, no masses.  No bruit or JVD.  Pulmonary:  Good air movement, no audible wheezing, no use of accessory muscles.  Cardiac: RRR, normal S1, S2, no Murmurs. Vascular:   well healed CEA scars no bruits Vessel Right Left  Radial Palpable Palpable  Carotid Palpable Palpable  Gastrointestinal: soft, non-distended. No guarding/no peritoneal signs.  Musculoskeletal: M/S 5/5 throughout.  No visible deformity.  Neurologic: CN 2-12 intact. Pain and light touch intact in extremities.  Symmetrical.  Speech is fluent. Motor exam as listed above. Psychiatric:  Judgment intact, Mood & affect appropriate for pt's clinical situation. Dermatologic: No rashes or ulcers noted.  No changes consistent with cellulitis.   CBC Lab Results  Component Value Date   WBC 5.1 12/21/2020   HGB 10.2 (L) 12/21/2020   HCT 30.9 (L) 12/21/2020   MCV 97.8 12/21/2020   PLT 171 12/21/2020    BMET    Component Value Date/Time   NA 143 12/21/2020 0411   NA 139 09/12/2011 0250   K 4.1 12/21/2020 0411   K 3.5 09/12/2011 0250   CL 117 (H) 12/21/2020 0411   CL 102 09/12/2011 0250   CO2 22 12/21/2020 0411   CO2 23 09/12/2011 0250   GLUCOSE 94 12/21/2020 0411   GLUCOSE 162 (H) 09/12/2011 0250   BUN 9 12/21/2020 0411   BUN 20 05/30/2016 1358   BUN 23 (H) 09/12/2011 0250   CREATININE 0.85 12/21/2020 0411   CREATININE 0.94 09/12/2011 0250   CALCIUM 9.2 12/21/2020 0411   CALCIUM 9.1 09/12/2011 0250   GFRNONAA >60 12/21/2020 0411   GFRNONAA >60 09/12/2011 0250   GFRAA 55 (L) 10/04/2019 1828   GFRAA >60 09/12/2011 0250   CrCl cannot be calculated (Patient's most recent lab result is older than the maximum 21 days allowed.).  COAG Lab Results  Component Value Date   INR 0.99 03/05/2018   INR 1.02 12/16/2017    Radiology VAS US CAROTID  Result Date: 07/23/2021 Carotid Arterial Duplex Study Patient Name:  MARGEE TRENTHAM  Date of Exam:   07/23/2021 Medical Rec #: [id number]        Accession #:    [id number] Date of Birth: 05-29-1933        Patient Gender: F Patient Age:   85 years Exam Location:  [facility] Procedure:      VAS US CAROTID Referring Phys: [name] --------------------------------------------------------------------------------  Other Factors:     Bilateral CEA's. Comparison Study:  07/03/2020 Performing Technologist: Concha Norway RVT  Examination Guidelines: A complete evaluation includes B-mode imaging, spectral Doppler, color Doppler, and power Doppler as needed of all accessible portions of each vessel. Bilateral testing is  considered an integral part of a complete examination. Limited  examinations for reoccurring indications may be performed as noted.  Right Carotid Findings: +----------+--------+--------+--------+------------------+--------+             PSV cm/s EDV cm/s Stenosis Plaque Description Comments  +----------+--------+--------+--------+------------------+--------+  CCA Prox   87       14                                             +----------+--------+--------+--------+------------------+--------+  CCA Mid    76       13                                             +----------+--------+--------+--------+------------------+--------+  CCA Distal 68       14                                             +----------+--------+--------+--------+------------------+--------+  ICA Prox   73       19       1-39%                                 +----------+--------+--------+--------+------------------+--------+  ICA Mid    77       16                                             +----------+--------+--------+--------+------------------+--------+  ICA Distal 78       18                                             +----------+--------+--------+--------+------------------+--------+  ECA        73       4                                              +----------+--------+--------+--------+------------------+--------+ +----------+--------+-------+----------------+-------------------+             PSV cm/s EDV cms Describe         Arm Pressure (mmHG)  +----------+--------+-------+----------------+-------------------+  Subclavian 64               Multiphasic, WNL                      +----------+--------+-------+----------------+-------------------+ +---------+--------+--+--------+-+---------+  Vertebral PSV cm/s 40 EDV cm/s 7 Antegrade  +---------+--------+--+--------+-+---------+  Left Carotid Findings: +----------+--------+--------+--------+------------------+--------+             PSV cm/s EDV cm/s Stenosis Plaque Description Comments   +----------+--------+--------+--------+------------------+--------+  CCA Prox   65       15                                             +----------+--------+--------+--------+------------------+--------+  CCA Mid    105      16                                             +----------+--------+--------+--------+------------------+--------+  CCA Distal 81       13                                             +----------+--------+--------+--------+------------------+--------+  ICA Prox   71       12       1-39%                                 +----------+--------+--------+--------+------------------+--------+  ICA Mid    75       19                                             +----------+--------+--------+--------+------------------+--------+  ICA Distal 59       17                                             +----------+--------+--------+--------+------------------+--------+  ECA        81       3                                              +----------+--------+--------+--------+------------------+--------+ +----------+--------+--------+----------------+-------------------+             PSV cm/s EDV cm/s Describe         Arm Pressure (mmHG)  +----------+--------+--------+----------------+-------------------+  Subclavian 112               Multiphasic, WNL                      +----------+--------+--------+----------------+-------------------+ +---------+--------+--+--------+--+---------+  Vertebral PSV cm/s 50 EDV cm/s 10 Antegrade  +---------+--------+--+--------+--+---------+   Summary: Right Carotid: Velocities in the right ICA are consistent with a 1-39% stenosis.                Non-hemodynamically significant plaque <50% noted in the CCA. The                ECA appears <50% stenosed. Widely patent ICA s/p CEA. Left Carotid: Velocities in the left ICA are consistent with a 1-39% stenosis.               Non-hemodynamically significant plaque <50% noted in the CCA. The               ECA appears <50% stenosed. Widely  patent ICA s/p CEA. Vertebrals:  Bilateral vertebral arteries demonstrate antegrade flow. Subclavians: Normal flow hemodynamics were seen in bilateral subclavian              arteries. *See table(s) above for measurements and observations.  Electronically signed by [name] MD on 07/23/2021 at  5:08:00 PM.    Final      Assessment/Plan 1. Bilateral carotid artery stenosis Recommend:   Given the patient's asymptomatic subcritical stenosis no further invasive testing or surgery at this time.   Previous duplex ultrasound shows <50% stenosis bilaterally.   Continue antiplatelet therapy as prescribed Continue management of CAD, HTN and Hyperlipidemia Healthy heart diet,  encouraged exercise at least 4 times per week   Follow up in 12 months with duplex ultrasound and physical exam - VAS US CAROTID; Future  2. PAD (peripheral artery disease) (HCC) Recommend:   The patient has evidence of atherosclerosis of the lower extremities with claudication.  The patient does not voice lifestyle limiting changes at this point in time.   Noninvasive studies do not suggest clinically significant change.   No invasive studies, angiography or surgery at this time The patient should continue walking and begin a more formal exercise program.  The patient should continue antiplatelet therapy and aggressive treatment of the lipid abnormalities   No changes in the patient's medications at this time  3. Coronary artery disease of native artery of native heart with stable angina pectoris (HCC) Continue cardiac and antihypertensive medications as already ordered and reviewed, no changes at this time.  Continue statin as ordered and reviewed, no changes at this time  Nitrates PRN for chest pain   4. Essential hypertension Continue antihypertensive medications as already ordered, these medications have been reviewed and there are no changes at this time.   5. 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  Hgb A1C to be monitored as already arranged by primary service   6. Mixed hyperlipidemia Continue statin as ordered and reviewed, no changes at this time     [name], MD  07/30/2021 3:41 PM

## 2021-08-09 ENCOUNTER — Other Ambulatory Visit: Payer: Self-pay | Admitting: Nephrology

## 2021-08-09 DIAGNOSIS — E119 Type 2 diabetes mellitus without complications: Secondary | ICD-10-CM

## 2021-08-09 DIAGNOSIS — D649 Anemia, unspecified: Secondary | ICD-10-CM

## 2021-08-09 DIAGNOSIS — N1831 Chronic kidney disease, stage 3a: Secondary | ICD-10-CM

## 2021-08-16 ENCOUNTER — Other Ambulatory Visit: Payer: Self-pay

## 2021-08-16 ENCOUNTER — Ambulatory Visit
Admission: RE | Admit: 2021-08-16 | Discharge: 2021-08-16 | Disposition: A | Discharge status: Home / Self Care | Payer: Medicare Other | Source: Ambulatory Visit | Attending: Nephrology | Admitting: Nephrology

## 2021-08-16 DIAGNOSIS — E119 Type 2 diabetes mellitus without complications: Secondary | ICD-10-CM | POA: Insufficient documentation

## 2021-08-16 DIAGNOSIS — D649 Anemia, unspecified: Secondary | ICD-10-CM | POA: Diagnosis present

## 2021-08-16 DIAGNOSIS — N1831 Chronic kidney disease, stage 3a: Secondary | ICD-10-CM | POA: Insufficient documentation

## 2021-08-23 ENCOUNTER — Other Ambulatory Visit: Payer: Medicare Other | Admitting: Primary Care

## 2021-08-23 ENCOUNTER — Other Ambulatory Visit: Payer: Self-pay

## 2021-08-29 ENCOUNTER — Telehealth: Payer: Self-pay

## 2021-08-29 NOTE — Telephone Encounter (Signed)
12 pm.  Phone call made to patient at the request of Ralene Bathe, NP to complete a check-in and schedule a home visit.  No answer.  Message left requesting a call back.

## 2021-09-12 ENCOUNTER — Other Ambulatory Visit: Payer: Medicare Other

## 2021-09-12 DIAGNOSIS — Z515 Encounter for palliative care: Secondary | ICD-10-CM

## 2021-09-13 ENCOUNTER — Other Ambulatory Visit: Payer: Self-pay

## 2021-09-13 ENCOUNTER — Telehealth: Payer: Self-pay

## 2021-09-13 ENCOUNTER — Encounter: Payer: Self-pay | Admitting: *Deleted

## 2021-09-13 NOTE — Progress Notes (Signed)
PATIENT NAME: Dawn Mcintyre DOB: Oct 30, 1932 MRN: [id number]  PRIMARY CARE PROVIDER: Idelle Crouch, MD  RESPONSIBLE PARTY:  Acct ID - Guarantor Home Phone Work Phone Relationship Acct Type  [id number] CATALIA, [phone]  Self P/F     [address], [place], [place] [address]    Due to the COVID-19 crisis, this visit was done via telemedicine from my office and it was initiated and consent by this patient and or family.  I connected with  [name] OR PROXY on 09/13/21 by telephone and verified that I am speaking with the correct person using two identifiers.   I discussed the limitations of evaluation and management by telemedicine. The patient expressed understanding and agreed to proceed.   Connected with patient by telephone to complete a check-in and offer a home visit with Ralene Bathe, NP.  Patient feels she is doing very well right now.  She has a private duty aid coming daily and RN visits monthly.  She continues to follow up with her providers on a regular basis.  Patient's biggest concern is her billing statement from [facility].  She is currently driving and is unable to give me the date of service.  Patient will call back with this information so I can further assist her.       Lorenza Burton, RN

## 2021-09-13 NOTE — Telephone Encounter (Signed)
1020 am.  Phone call made to PCP office advise of patient's decision to discharge from services.

## 2021-09-13 NOTE — Telephone Encounter (Signed)
919 am.  Incoming call from [employer].  She advised of her billing statement that was received last month from [place] last month.  Patient confirmed DOS was 06/13/21.  Advised this bill was from a visit completed by Ralene Bathe, NP on that date.  Patient advised she has paid this bill as of 09/06/20.  Patient requested to be discharged from Palliative Care services at this time.  She currently as a Database administrator daily through her [employer] insurance and a nurse who makes monthly visits. She also makes routine visits to her providers.  Advised patient that I would notify her PCP of this decision.  No other concerns voiced at this time.   Ralene Bathe, NP notified of patient request to discharge from services.

## 2021-09-19 ENCOUNTER — Inpatient Hospital Stay: Payer: Medicare Other | Attending: Internal Medicine | Admitting: Internal Medicine

## 2021-09-19 ENCOUNTER — Encounter: Payer: Self-pay | Admitting: Internal Medicine

## 2021-09-19 ENCOUNTER — Inpatient Hospital Stay: Payer: Medicare Other

## 2021-09-19 ENCOUNTER — Other Ambulatory Visit: Payer: Self-pay

## 2021-09-19 DIAGNOSIS — R200 Anesthesia of skin: Secondary | ICD-10-CM | POA: Diagnosis not present

## 2021-09-19 DIAGNOSIS — Z808 Family history of malignant neoplasm of other organs or systems: Secondary | ICD-10-CM | POA: Diagnosis not present

## 2021-09-19 DIAGNOSIS — D631 Anemia in chronic kidney disease: Secondary | ICD-10-CM

## 2021-09-19 DIAGNOSIS — Z79899 Other long term (current) drug therapy: Secondary | ICD-10-CM | POA: Diagnosis not present

## 2021-09-19 DIAGNOSIS — Z8042 Family history of malignant neoplasm of prostate: Secondary | ICD-10-CM

## 2021-09-19 DIAGNOSIS — N183 Chronic kidney disease, stage 3 unspecified: Secondary | ICD-10-CM

## 2021-09-19 DIAGNOSIS — Z801 Family history of malignant neoplasm of trachea, bronchus and lung: Secondary | ICD-10-CM | POA: Diagnosis not present

## 2021-09-19 DIAGNOSIS — D472 Monoclonal gammopathy: Secondary | ICD-10-CM | POA: Diagnosis present

## 2021-09-19 NOTE — Progress Notes (Signed)
C/o with lab work. What is this visit for?

## 2021-09-19 NOTE — Assessment & Plan Note (Addendum)
#  October 2022 [neurology] IgG lambda 0.2 g/dL; kappa lambda light chain ratio normal.  Chronic kidney disease GFR 50-60; mild anemia hemoglobin 11; normal calcium.  No worsening bone pain.  Clinical suggestive of MGUS.   # MGUS-long discussion with the patient regarding natural history of MGUS; small risk of progression to multiple myeloma. Patient is less likely at this time patient has any active myeloma-although given renal insufficiency/anemia [see discussion below].  Also discussed regarding bone marrow biopsy for diagnosis of multiple myeloma.  However I would not recommend bone marrow biopsy given low clinical suspicion of active myeloma.  #Anemia-secondary to CKD-mild around 11 monitor follow-up.  We will check iron studies at next visit.  #Chronic kidney disease-stage-stage III [Dr.K]; followed by nephrology.     Thank you Dr.Shah for allowing me to participate in the care of your pleasant patient. Please do not hesitate to contact me with questions or concerns in the interim.  # DISPOSITION: # NO labs today # Follow up in 3 months- 1 week Prior- cbc/cmp/MM panel; K/l light chains-Dr.B

## 2021-09-19 NOTE — Progress Notes (Signed)
[place] NOTE  Patient Care Team: Idelle Crouch, MD as PCP - General (Internal Medicine) Cammie Sickle, MD as Consulting Physician (Hematology) Vladimir Crofts, MD as Consulting Physician (Neurology)  CHIEF COMPLAINTS/PURPOSE OF CONSULTATION: Monoclonal gammopathy  HEMATOLOGY HISTORY  # OCT 2022 [Dr.Shah; Neuropathy-]Immunofixation shows IgG monoclonal protein with lambda light chain specificity; K/L=WNL.   # CKD stage- IIIA- [Dr.Korrpati]; # Temporal arteritis: Continue the small dose of prednisone daily; OA  HISTORY OF PRESENTING ILLNESS: Accompanied by her sister.  Ambulating independently.  Annie Main Okeefe 88 y.o.  female has been referred to [place] by neurology for further evaluation/work-up for monoclonal gammopathy.  Patient denies any worsening joint pains or bone pain.  She has chronic osteoarthritis.  Denies any blood in stools or black-colored stools.  Has tingling and numbness in extremities for which she is taking gabapentin.  Family history of multiple myeloma-none.   Review of Systems  Constitutional:  Negative for chills, diaphoresis, fever, malaise/fatigue and weight loss.  HENT:  Negative for nosebleeds and sore throat.   Eyes:  Negative for double vision.  Respiratory:  Negative for cough, hemoptysis, sputum production, shortness of breath and wheezing.   Cardiovascular:  Negative for chest pain, palpitations, orthopnea and leg swelling.  Gastrointestinal:  Negative for abdominal pain, blood in stool, constipation, diarrhea, heartburn, melena, nausea and vomiting.  Genitourinary:  Negative for dysuria, frequency and urgency.  Musculoskeletal:  Negative for back pain and joint pain.  Skin: Negative.  Negative for itching and rash.  Neurological:  Positive for tingling. Negative for dizziness, focal weakness, weakness and headaches.  Endo/Heme/Allergies:  Does not bruise/bleed easily.  Psychiatric/Behavioral:  Negative for  depression. The patient is not nervous/anxious and does not have insomnia.    MEDICAL HISTORY:  Past Medical History:  Diagnosis Date   Arteritis ([place])    Arthritis    Atrophic vaginitis 11/16/2014   Carotid artery stenosis    Cataract    Difficult intubation    Environmental and seasonal allergies    GERD (gastroesophageal reflux disease)    Hyperlipidemia    Hypertension    Lack of bladder control    Neuropathy    Osteoporosis    Pneumonia     x 56yr ago   Pre-diabetes    Shingles    Ulcer     SURGICAL HISTORY: Past Surgical History:  Procedure Laterality Date   ABDOMINAL HYSTERECTOMY  1972   BREAST CYST EXCISION Right 1972   BREAST CYST EXCISION Left 1972   ENDARTERECTOMY Left 12/24/2017   Procedure: ENDARTERECTOMY CAROTID;  Surgeon: SKatha Cabal MD;  Location: [facility];  Service: Vascular;  Laterality: Left;   ENDARTERECTOMY Right 03/13/2018   Procedure: ENDARTERECTOMY CAROTID;  Surgeon: SKatha Cabal MD;  Location: [facility];  Service: Vascular;  Laterality: Right;   ESOPHAGOGASTRODUODENOSCOPY (EGD) WITH PROPOFOL N/A 11/28/2020   Procedure: ESOPHAGOGASTRODUODENOSCOPY (EGD) WITH PROPOFOL;  Surgeon: WLucilla Lame MD;  Location: [facility];  Service: Endoscopy;  Laterality: N/A;   EYE SURGERY     THROAT SURGERY     x 10 yrs ago. throat mass.   TONSILLECTOMY      SOCIAL HISTORY: Social History   Socioeconomic History   Marital status: Widowed    Spouse name: Not on file   Number of children: Not on file   Years of education: Not on file   Highest education level: Not on file  Occupational History   Not on file  Tobacco Use  Smoking status: Never    Passive exposure: Never   Smokeless tobacco: Never  Vaping Use   Vaping Use: Never used  Substance and Sexual Activity   Alcohol use: No    Alcohol/week: 0.0 standard drinks   Drug use: No   Sexual activity: Not Currently  Other Topics Concern   Not on file  Social History Narrative   Not on  file   Social Determinants of Health   Financial Resource Strain: Not on file  Food Insecurity: Not on file  Transportation Needs: Not on file  Physical Activity: Not on file  Stress: Not on file  Social Connections: Not on file  Intimate Partner Violence: Not on file    FAMILY HISTORY: Family History  Problem Relation Age of Onset   Anuerysm Mother    Prostate cancer Father    Esophageal cancer Sister    Lung cancer Sister    Diabetes Brother    Hearing loss Brother    Bladder Cancer Neg Hx    Kidney cancer Neg Hx    Breast cancer Neg Hx     ALLERGIES:  is allergic to amoxicillin-pot clavulanate, codeine, hydrocodone, metronidazole, propoxyphene, sulfa antibiotics, and latex.  MEDICATIONS:  Current Outpatient Medications  Medication Sig Dispense Refill   acetaminophen (TYLENOL) 500 MG tablet Take 500 mg by mouth daily as needed for headache.     ALPRAZolam (XANAX) 0.25 MG tablet Take 0.25 mg by mouth at bedtime as needed for sleep.     ascorbic acid (VITAMIN C) 1000 MG tablet Take 1,000 mg by mouth daily.     aspirin 81 MG tablet Take 81 mg by mouth daily.     Cholecalciferol (VITAMIN D3) 1.25 MG (50000 UT) CAPS Take by mouth.     cloNIDine (CATAPRES) 0.1 MG tablet Take 0.1 mg by mouth at bedtime.     conjugated estrogens (PREMARIN) vaginal cream Apply one pea-sized amount around the opening of the urethra three times weekly. 30 g 3   felodipine (PLENDIL) 2.5 MG 24 hr tablet Take 2.5 mg by mouth daily. May take a second 2.5 mg dose as needed if bp is over 150/90     fexofenadine (ALLEGRA) 180 MG tablet Take 180 mg by mouth daily as needed for allergies.      fluconazole (DIFLUCAN) 150 MG tablet Take 1 tablet (150 mg total) by mouth daily. Repeat in 24 hours if needed 2 tablet 2   hydrochlorothiazide (HYDRODIURIL) 12.5 MG tablet Take 1 tablet by mouth daily.     isosorbide mononitrate (IMDUR) 60 MG 24 hr tablet Take 60 mg by mouth daily.     losartan (COZAAR) 100 MG tablet  Take 1 tablet by mouth daily.     metoprolol tartrate (LOPRESSOR) 100 MG tablet Take 100 mg by mouth 2 (two) times daily.     mirabegron ER (MYRBETRIQ) 25 MG TB24 tablet Take by mouth.     ondansetron (ZOFRAN) 4 MG tablet Take 4 mg by mouth every 8 (eight) hours as needed.     vitamin E 180 MG (400 UNITS) capsule Take 400 Units by mouth daily.     gabapentin (NEURONTIN) 300 MG capsule Take 1 capsule (300 mg total) by mouth 3 (three) times daily. 270 capsule 0   pantoprazole (PROTONIX) 40 MG tablet Take 1 tablet (40 mg total) by mouth 2 (two) times daily before a meal. 60 tablet 0   No current facility-administered medications for this visit.      PHYSICAL EXAMINATION:  There were no vitals filed for this visit. There were no vitals filed for this visit.  Physical Exam Vitals and nursing note reviewed.  HENT:     Head: Normocephalic and atraumatic.     Mouth/Throat:     Pharynx: Oropharynx is clear.  Eyes:     Extraocular Movements: Extraocular movements intact.     Pupils: Pupils are equal, round, and reactive to light.  Cardiovascular:     Rate and Rhythm: Normal rate and regular rhythm.  Pulmonary:     Comments: Decreased breath sounds bilaterally.  Abdominal:     Palpations: Abdomen is soft.  Musculoskeletal:        General: Normal range of motion.     Cervical back: Normal range of motion.  Skin:    General: Skin is warm.  Neurological:     General: No focal deficit present.     Mental Status: She is alert and oriented to person, place, and time.  Psychiatric:        Behavior: Behavior normal.        Judgment: Judgment normal.    LABORATORY DATA:  I have reviewed the data as listed Lab Results  Component Value Date   WBC 5.1 12/21/2020   HGB 10.2 (L) 12/21/2020   HCT 30.9 (L) 12/21/2020   MCV 97.8 12/21/2020   PLT 171 12/21/2020   Recent Labs    11/04/20 0605 11/06/20 1853 11/26/20 1417 11/27/20 0415 12/19/20 0915 12/20/20 0451 12/21/20 0411  NA  134* 131* 139   < > 138 140 143  K 3.1* 3.6 3.1*   < > 2.6* 3.7 4.1  CL 98 94* 101   < > 102 112* 117*  CO2 26 24 29    < > 25 24 22   GLUCOSE 122* 139* 122*   < > 137* 91 94  BUN 11 13 13    < > 12 8 9   CREATININE 0.88 0.96 0.93   < > 0.99 0.75 0.85  CALCIUM 10.4* 10.4* 10.0   < > 9.8 9.1 9.2  GFRNONAA >60 58* >60   < > 56* >60 >60  PROT 7.1 7.4 6.0*  --  6.8  --   --   ALBUMIN 4.3 4.3 3.5  --  4.1  --   --   AST 18 28 18   --  19  --   --   ALT 13 14 9   --  7  --   --   ALKPHOS 61 60 51  --  60  --   --   BILITOT 0.9 1.3* 0.6  --  0.9  --   --   BILIDIR 0.1  --   --   --   --   --   --   IBILI 0.8  --   --   --   --   --   --    < > = values in this interval not displayed.     No results found.  No results found for: KPAFRELGTCHN, LAMBDASER, KAPLAMBRATIO   MGUS (monoclonal gammopathy of unknown significance) #October 2022 [neurology] IgG lambda 0.2 g/dL; kappa lambda light chain ratio normal.  Chronic kidney disease GFR 50-60; mild anemia hemoglobin 11; normal calcium.  No worsening bone pain.  Clinical suggestive of MGUS.   # MGUS-long discussion with the patient regarding natural history of MGUS; small risk of progression to multiple myeloma. Patient is less likely at this time patient has any active myeloma-although given renal insufficiency/anemia [  see discussion below].  Also discussed regarding bone marrow biopsy for diagnosis of multiple myeloma.  However I would not recommend bone marrow biopsy given low clinical suspicion of active myeloma.  #Anemia-secondary to CKD-mild around 11 monitor follow-up.  We will check iron studies at next visit.  #Chronic kidney disease-stage-stage III [Dr.K]; followed by nephrology.     Thank you Dr.Shah for allowing me to participate in the care of your pleasant patient. Please do not hesitate to contact me with questions or concerns in the interim.  # DISPOSITION: # NO labs today # Follow up in 3 months- 1 week Prior- cbc/cmp/MM panel;  K/l light chains-Dr.B    All questions were answered. The patient knows to call the clinic with any problems, questions or concerns.      Cammie Sickle, MD 09/19/2021 3:11 PM

## 2021-09-24 ENCOUNTER — Other Ambulatory Visit (HOSPITAL_COMMUNITY)
Admission: RE | Admit: 2021-09-24 | Discharge: 2021-09-24 | Disposition: A | Discharge status: Home / Self Care | Payer: Medicare Other | Source: Ambulatory Visit | Attending: Obstetrics and Gynecology | Admitting: Obstetrics and Gynecology

## 2021-09-24 ENCOUNTER — Ambulatory Visit: Payer: Medicare Other | Admitting: Obstetrics and Gynecology

## 2021-09-24 ENCOUNTER — Encounter: Payer: Self-pay | Admitting: Obstetrics and Gynecology

## 2021-09-24 ENCOUNTER — Other Ambulatory Visit: Payer: Self-pay

## 2021-09-24 VITALS — BP 152/64 | HR 61

## 2021-09-24 DIAGNOSIS — R300 Dysuria: Secondary | ICD-10-CM

## 2021-09-24 DIAGNOSIS — N760 Acute vaginitis: Secondary | ICD-10-CM | POA: Insufficient documentation

## 2021-09-24 LAB — POCT URINALYSIS DIPSTICK
Blood, UA: NEGATIVE
Leukocytes, UA: NEGATIVE

## 2021-09-24 MED ORDER — CEPHALEXIN 500 MG PO CAPS: 500.0000 mg | ORAL_CAPSULE | Freq: Four times a day (QID) | ORAL | 2 refills | Status: DC

## 2021-09-24 NOTE — Addendum Note (Signed)
Addended by: Crosby Oyster on: 09/24/2021 01:51 PM   Modules accepted: Orders

## 2021-09-24 NOTE — Progress Notes (Signed)
88 yo P1 postmenopausal presenting for the evaluation of a 2-day history of worsening lower abdominal pain and dysuria. Patient denies any abnormal vaginal discharge or vaginal pruritis. Patient states the pain started 2-3 days ago and worsened in severity. She is without any other complaints  Past Medical History:  Diagnosis Date   Arteritis ([place])    Arthritis    Atrophic vaginitis 11/16/2014   Carotid artery stenosis    Cataract    Difficult intubation    Environmental and seasonal allergies    GERD (gastroesophageal reflux disease)    Hyperlipidemia    Hypertension    Lack of bladder control    Neuropathy    Osteoporosis    Pneumonia     x 65yrs ago   Pre-diabetes    Shingles    Ulcer    Past Surgical History:  Procedure Laterality Date   ABDOMINAL HYSTERECTOMY  1972   BREAST CYST EXCISION Right 1972   BREAST CYST EXCISION Left 1972   ENDARTERECTOMY Left 12/24/2017   Procedure: ENDARTERECTOMY CAROTID;  Surgeon: Katha Cabal, MD;  Location: [facility];  Service: Vascular;  Laterality: Left;   ENDARTERECTOMY Right 03/13/2018   Procedure: ENDARTERECTOMY CAROTID;  Surgeon: Katha Cabal, MD;  Location: [facility];  Service: Vascular;  Laterality: Right;   ESOPHAGOGASTRODUODENOSCOPY (EGD) WITH PROPOFOL N/A 11/28/2020   Procedure: ESOPHAGOGASTRODUODENOSCOPY (EGD) WITH PROPOFOL;  Surgeon: Lucilla Lame, MD;  Location: [facility];  Service: Endoscopy;  Laterality: N/A;   EYE SURGERY     THROAT SURGERY     x 10 yrs ago. throat mass.   TONSILLECTOMY      Family History  Problem Relation Age of Onset   Anuerysm Mother    Prostate cancer Father    Esophageal cancer Sister    Lung cancer Sister    Diabetes Brother    Hearing loss Brother    Bladder Cancer Neg Hx    Kidney cancer Neg Hx    Breast cancer Neg Hx    Social History   Tobacco Use   Smoking status: Never    Passive exposure: Never   Smokeless tobacco: Never  Vaping Use   Vaping Use: Never used   Substance Use Topics   Alcohol use: No    Alcohol/week: 0.0 standard drinks   Drug use: No   ROS See pertinent in HPI. All other systems reviewed and non contributory Blood pressure (!) 152/64, pulse 61.  GENERAL: Well-developed, well-nourished female in no acute distress.  ABDOMEN: Soft, nontender, nondistended. No organomegaly. PELVIC: Normal external female genitalia. Vagina is pale and atrophic.  Normal discharge. Chaperone present during the pelvic exam EXTREMITIES: No cyanosis, clubbing, or edema, 2+ distal pulses.  A/P 88 yo with  - urine culture collected - vaginal swab collected - Rx keflex provided for presumed UTI - Patient will be contacted with abnormal results

## 2021-09-25 LAB — CERVICOVAGINAL ANCILLARY ONLY
Bacterial Vaginitis (gardnerella): NEGATIVE
Candida Glabrata: NEGATIVE
Candida Vaginitis: NEGATIVE
Comment: NEGATIVE
Comment: NEGATIVE
Comment: NEGATIVE

## 2021-09-26 LAB — URINE CULTURE: Organism ID, Bacteria: NO GROWTH

## 2021-09-28 ENCOUNTER — Other Ambulatory Visit: Payer: Self-pay | Admitting: *Deleted

## 2021-09-28 MED ORDER — FLUCONAZOLE 150 MG PO TABS: 150.0000 mg | ORAL_TABLET | Freq: Once | ORAL | 3 refills | Status: DC

## 2021-10-24 DIAGNOSIS — R609 Edema, unspecified: Secondary | ICD-10-CM | POA: Insufficient documentation

## 2021-10-24 DIAGNOSIS — N281 Cyst of kidney, acquired: Secondary | ICD-10-CM | POA: Insufficient documentation

## 2021-10-27 ENCOUNTER — Emergency Department: Payer: Medicare Other

## 2021-10-27 ENCOUNTER — Other Ambulatory Visit: Payer: Self-pay

## 2021-10-27 ENCOUNTER — Observation Stay: Payer: Medicare Other

## 2021-10-27 ENCOUNTER — Encounter: Payer: Self-pay | Admitting: Emergency Medicine

## 2021-10-27 ENCOUNTER — Inpatient Hospital Stay
Admission: EM | Admit: 2021-10-27 | Discharge: 2021-10-29 | DRG: 871 | Disposition: A | Discharge status: Home / Self Care | Payer: Medicare Other | Attending: Internal Medicine | Admitting: Internal Medicine

## 2021-10-27 DIAGNOSIS — A09 Infectious gastroenteritis and colitis, unspecified: Secondary | ICD-10-CM | POA: Diagnosis not present

## 2021-10-27 DIAGNOSIS — E876 Hypokalemia: Secondary | ICD-10-CM | POA: Diagnosis present

## 2021-10-27 DIAGNOSIS — A419 Sepsis, unspecified organism: Principal | ICD-10-CM | POA: Diagnosis present

## 2021-10-27 DIAGNOSIS — E1122 Type 2 diabetes mellitus with diabetic chronic kidney disease: Secondary | ICD-10-CM | POA: Diagnosis present

## 2021-10-27 DIAGNOSIS — K219 Gastro-esophageal reflux disease without esophagitis: Secondary | ICD-10-CM | POA: Diagnosis present

## 2021-10-27 DIAGNOSIS — D631 Anemia in chronic kidney disease: Secondary | ICD-10-CM | POA: Diagnosis present

## 2021-10-27 DIAGNOSIS — N390 Urinary tract infection, site not specified: Secondary | ICD-10-CM | POA: Diagnosis not present

## 2021-10-27 DIAGNOSIS — I10 Essential (primary) hypertension: Secondary | ICD-10-CM

## 2021-10-27 DIAGNOSIS — K567 Ileus, unspecified: Secondary | ICD-10-CM | POA: Diagnosis present

## 2021-10-27 DIAGNOSIS — Z888 Allergy status to other drugs, medicaments and biological substances status: Secondary | ICD-10-CM

## 2021-10-27 DIAGNOSIS — M316 Other giant cell arteritis: Secondary | ICD-10-CM | POA: Diagnosis present

## 2021-10-27 DIAGNOSIS — N2581 Secondary hyperparathyroidism of renal origin: Secondary | ICD-10-CM | POA: Diagnosis present

## 2021-10-27 DIAGNOSIS — Z7952 Long term (current) use of systemic steroids: Secondary | ICD-10-CM

## 2021-10-27 DIAGNOSIS — E1142 Type 2 diabetes mellitus with diabetic polyneuropathy: Secondary | ICD-10-CM | POA: Diagnosis present

## 2021-10-27 DIAGNOSIS — J189 Pneumonia, unspecified organism: Secondary | ICD-10-CM | POA: Diagnosis present

## 2021-10-27 DIAGNOSIS — Z7984 Long term (current) use of oral hypoglycemic drugs: Secondary | ICD-10-CM

## 2021-10-27 DIAGNOSIS — Z7982 Long term (current) use of aspirin: Secondary | ICD-10-CM

## 2021-10-27 DIAGNOSIS — Z833 Family history of diabetes mellitus: Secondary | ICD-10-CM

## 2021-10-27 DIAGNOSIS — Z880 Allergy status to penicillin: Secondary | ICD-10-CM

## 2021-10-27 DIAGNOSIS — Z882 Allergy status to sulfonamides status: Secondary | ICD-10-CM

## 2021-10-27 DIAGNOSIS — B999 Unspecified infectious disease: Secondary | ICD-10-CM

## 2021-10-27 DIAGNOSIS — Z91040 Latex allergy status: Secondary | ICD-10-CM

## 2021-10-27 DIAGNOSIS — N1831 Chronic kidney disease, stage 3a: Secondary | ICD-10-CM | POA: Diagnosis not present

## 2021-10-27 DIAGNOSIS — Z20822 Contact with and (suspected) exposure to covid-19: Secondary | ICD-10-CM | POA: Diagnosis present

## 2021-10-27 DIAGNOSIS — R6510 Systemic inflammatory response syndrome (SIRS) of non-infectious origin without acute organ dysfunction: Secondary | ICD-10-CM | POA: Diagnosis present

## 2021-10-27 DIAGNOSIS — I129 Hypertensive chronic kidney disease with stage 1 through stage 4 chronic kidney disease, or unspecified chronic kidney disease: Secondary | ICD-10-CM | POA: Diagnosis present

## 2021-10-27 DIAGNOSIS — E785 Hyperlipidemia, unspecified: Secondary | ICD-10-CM | POA: Diagnosis present

## 2021-10-27 DIAGNOSIS — Z885 Allergy status to narcotic agent status: Secondary | ICD-10-CM

## 2021-10-27 DIAGNOSIS — Z79899 Other long term (current) drug therapy: Secondary | ICD-10-CM

## 2021-10-27 LAB — CBC
HCT: 42.3 % (ref 36.0–46.0)
Hemoglobin: 13.3 g/dL (ref 12.0–15.0)
MCH: 29.4 pg (ref 26.0–34.0)
MCHC: 31.4 g/dL (ref 30.0–36.0)
MCV: 93.6 fL (ref 80.0–100.0)
Platelets: 191 10*3/uL (ref 150–400)
RBC: 4.52 MIL/uL (ref 3.87–5.11)
RDW: 12.9 % (ref 11.5–15.5)
WBC: 11.3 10*3/uL — ABNORMAL HIGH (ref 4.0–10.5)
nRBC: 0 % (ref 0.0–0.2)

## 2021-10-27 LAB — COMPREHENSIVE METABOLIC PANEL
ALT: 16 U/L (ref 0–44)
AST: 27 U/L (ref 15–41)
Albumin: 4.1 g/dL (ref 3.5–5.0)
Alkaline Phosphatase: 70 U/L (ref 38–126)
Anion gap: 12 (ref 5–15)
BUN: 21 mg/dL (ref 8–23)
CO2: 22 mmol/L (ref 22–32)
Calcium: 9.7 mg/dL (ref 8.9–10.3)
Chloride: 105 mmol/L (ref 98–111)
Creatinine, Ser: 0.91 mg/dL (ref 0.44–1.00)
GFR, Estimated: >60 mL/min (ref >60)
Glucose, Bld: 113 mg/dL — ABNORMAL HIGH (ref 70–99)
Potassium: 3.7 mmol/L (ref 3.5–5.1)
Sodium: 139 mmol/L (ref 135–145)
Total Bilirubin: 0.9 mg/dL (ref 0.3–1.2)
Total Protein: 7.4 g/dL (ref 6.5–8.1)

## 2021-10-27 LAB — LIPASE, BLOOD: Lipase: 109 U/L — ABNORMAL HIGH (ref 11–51)

## 2021-10-27 LAB — RESP PANEL BY RT-PCR (FLU A&B, COVID) ARPGX2
Influenza A by PCR: NEGATIVE
Influenza B by PCR: NEGATIVE
SARS Coronavirus 2 by RT PCR: NEGATIVE

## 2021-10-27 LAB — URINALYSIS, ROUTINE W REFLEX MICROSCOPIC
Bilirubin Urine: NEGATIVE
Glucose, UA: NEGATIVE mg/dL
Ketones, ur: NEGATIVE mg/dL
Nitrite: NEGATIVE
Protein, ur: 100 mg/dL — AB
Specific Gravity, Urine: 1.02 (ref 1.005–1.030)
Squamous Epithelial / HPF: NONE SEEN (ref 0–5)
pH: 5 (ref 5.0–8.0)

## 2021-10-27 LAB — LACTIC ACID, PLASMA: Lactic Acid, Venous: 1.9 mmol/L (ref 0.5–1.9)

## 2021-10-27 MED ORDER — ONDANSETRON HCL 4 MG PO TABS: 4.0000 mg | ORAL_TABLET | Freq: Four times a day (QID) | ORAL | Status: DC | PRN

## 2021-10-27 MED ORDER — GABAPENTIN 100 MG PO CAPS
100.0000 mg | ORAL_CAPSULE | Freq: Three times a day (TID) | ORAL | Status: DC
  Administered 2021-10-28 – 2021-10-29 (×6): 100 mg via ORAL
  Filled 2021-10-27 (×6): qty 1

## 2021-10-27 MED ORDER — LACTATED RINGERS IV SOLN: INTRAVENOUS | Status: DC

## 2021-10-27 MED ORDER — HYDRALAZINE HCL 20 MG/ML IJ SOLN
10.0000 mg | Freq: Four times a day (QID) | INTRAMUSCULAR | Status: DC | PRN
Start: 2021-10-27 — End: 2021-10-29

## 2021-10-27 MED ORDER — ISOSORBIDE MONONITRATE ER 30 MG PO TB24
60.0000 mg | ORAL_TABLET | Freq: Every day | ORAL | Status: DC
  Administered 2021-10-29: 60 mg via ORAL
  Filled 2021-10-27: qty 2

## 2021-10-27 MED ORDER — CLONIDINE HCL 0.1 MG PO TABS
0.1000 mg | ORAL_TABLET | Freq: Every day | ORAL | Status: DC
  Administered 2021-10-28 (×2): 0.1 mg via ORAL
  Filled 2021-10-27 (×2): qty 1

## 2021-10-27 MED ORDER — MIRABEGRON ER 25 MG PO TB24
25.0000 mg | ORAL_TABLET | Freq: Every day | ORAL | Status: DC
  Administered 2021-10-28 – 2021-10-29 (×2): 25 mg via ORAL
  Filled 2021-10-27 (×2): qty 1

## 2021-10-27 MED ORDER — SODIUM CHLORIDE 0.9 % IV SOLN
1.0000 g | Freq: Once | INTRAVENOUS | Status: AC
  Administered 2021-10-27: 1 g via INTRAVENOUS
  Filled 2021-10-27: qty 10

## 2021-10-27 MED ORDER — ALPRAZOLAM 0.25 MG PO TABS
0.2500 mg | ORAL_TABLET | Freq: Every evening | ORAL | Status: DC | PRN
  Administered 2021-10-28 (×2): 0.25 mg via ORAL
  Filled 2021-10-27 (×2): qty 1

## 2021-10-27 MED ORDER — PANTOPRAZOLE SODIUM 40 MG PO TBEC
40.0000 mg | DELAYED_RELEASE_TABLET | Freq: Two times a day (BID) | ORAL | Status: DC
Start: 2021-10-28 — End: 2021-10-28

## 2021-10-27 MED ORDER — INSULIN ASPART 100 UNIT/ML IJ SOLN: 0.0000 [IU] | Freq: Three times a day (TID) | INTRAMUSCULAR | Status: DC

## 2021-10-27 MED ORDER — ACETAMINOPHEN 325 MG PO TABS
650.0000 mg | ORAL_TABLET | Freq: Four times a day (QID) | ORAL | Status: DC | PRN
Start: 2021-10-27 — End: 2021-10-29

## 2021-10-27 MED ORDER — ONDANSETRON HCL 4 MG/2ML IJ SOLN
4.0000 mg | Freq: Once | INTRAMUSCULAR | Status: AC
  Administered 2021-10-27: 4 mg via INTRAVENOUS
  Filled 2021-10-27: qty 2

## 2021-10-27 MED ORDER — SODIUM CHLORIDE 0.9 % IV SOLN: 1.0000 g | Freq: Once | INTRAVENOUS | Status: DC

## 2021-10-27 MED ORDER — METOPROLOL TARTRATE 50 MG PO TABS
100.0000 mg | ORAL_TABLET | Freq: Two times a day (BID) | ORAL | Status: DC
  Administered 2021-10-28 – 2021-10-29 (×4): 100 mg via ORAL
  Filled 2021-10-27 (×4): qty 2

## 2021-10-27 MED ORDER — SODIUM CHLORIDE 0.9 % IV BOLUS
1000.0000 mL | Freq: Once | INTRAVENOUS | Status: AC
  Administered 2021-10-27: 1000 mL via INTRAVENOUS

## 2021-10-27 MED ORDER — IOHEXOL 300 MG/ML  SOLN
75.0000 mL | Freq: Once | INTRAMUSCULAR | Status: AC | PRN
  Administered 2021-10-27: 75 mL via INTRAVENOUS

## 2021-10-27 MED ORDER — FELODIPINE ER 2.5 MG PO TB24
2.5000 mg | ORAL_TABLET | Freq: Every day | ORAL | Status: DC
  Administered 2021-10-29: 2.5 mg via ORAL
  Filled 2021-10-27 (×2): qty 1

## 2021-10-27 MED ORDER — POLYETHYLENE GLYCOL 3350 17 G PO PACK: 17.0000 g | PACK | Freq: Every day | ORAL | Status: DC | PRN

## 2021-10-27 MED ORDER — SODIUM CHLORIDE 0.9 % IV SOLN: 2.0000 g | INTRAVENOUS | Status: DC

## 2021-10-27 MED ORDER — ACETAMINOPHEN 500 MG PO TABS
1000.0000 mg | ORAL_TABLET | Freq: Once | ORAL | Status: AC
  Administered 2021-10-27: 1000 mg via ORAL
  Filled 2021-10-27: qty 2

## 2021-10-27 MED ORDER — LORATADINE 10 MG PO TABS
10.0000 mg | ORAL_TABLET | Freq: Every day | ORAL | Status: DC | PRN
Start: 2021-10-27 — End: 2021-10-29

## 2021-10-27 MED ORDER — ONDANSETRON HCL 4 MG/2ML IJ SOLN
4.0000 mg | Freq: Four times a day (QID) | INTRAMUSCULAR | Status: DC | PRN
Start: 2021-10-27 — End: 2021-10-29

## 2021-10-27 MED ORDER — ENOXAPARIN SODIUM 40 MG/0.4ML IJ SOSY
40.0000 mg | PREFILLED_SYRINGE | INTRAMUSCULAR | Status: DC
  Administered 2021-10-28 (×2): 40 mg via SUBCUTANEOUS
  Filled 2021-10-27 (×2): qty 0.4

## 2021-10-27 MED ORDER — ACETAMINOPHEN 650 MG RE SUPP: 650.0000 mg | Freq: Four times a day (QID) | RECTAL | Status: DC | PRN

## 2021-10-27 MED ORDER — PREDNISONE 2.5 MG PO TABS
2.5000 mg | ORAL_TABLET | Freq: Every day | ORAL | Status: DC
Start: 2021-10-28 — End: 2021-10-29
  Administered 2021-10-28 – 2021-10-29 (×2): 2.5 mg via ORAL
  Filled 2021-10-27 (×2): qty 1

## 2021-10-27 MED ORDER — ASPIRIN 81 MG PO CHEW
81.0000 mg | CHEWABLE_TABLET | Freq: Every day | ORAL | Status: DC
  Administered 2021-10-28 – 2021-10-29 (×2): 81 mg via ORAL
  Filled 2021-10-27 (×2): qty 1

## 2021-10-27 NOTE — H&P (Signed)
?History and Physical  ? ? ?Patient: Dawn Mcintyre MRN: [id number] [place]: 10/27/2021 ? ?Date of Service: the patient was seen and examined on 10/28/2021 ? ?Patient coming from: Home ? ?Chief Complaint:  ?Chief Complaint  ?Patient presents with  ? Abdominal Pain  ? Emesis  ? ? ?HPI:  ? ?88 year old female with past medical history of temporal arteritis on chronic prednisone therapy, gastroesophageal reflux disease, non-insulin-dependent diabetes mellitus type 2, secondary hyperparathyroidism, anemia of chronic disease, hypertension, hyperlipidemia, chronic kidney disease stage IIIa presenting to [facility] emergency department with complaints of nausea vomiting and abdominal pain. ? ?Patient began to experience lower abdominal discomfort and dysuria the third week of February.  This prompted her to present to her OB/GYN with these complaints.  At that time patient was placed on an empiric course of Keflex on 2/20.  Urine culture obtained at that time exhibited no growth.  Symptoms transiently improved however slightly over a week later they returned with recurring abdominal discomfort nausea and vomiting prompting her to see her primary care provider on 3/1.  At that time patient was treated empirically with a course of ciprofloxacin for suspected urinary tract infection although urinalysis once again at time was unremarkable.  Once again, symptoms transiently improved the patient began to develop recurrent abdominal discomfort for which she was prescribed a course of intravaginal metronidazole on 3/14. ? ?Patient now states that she developed recurrent symptoms of nausea and vomiting occurring for the past several days with complaints of dysuria and lower abdominal pain.  This prompted her to eventually present to [facility] emergency department for evaluation. ? ?Upon evaluation in the emergency department patient began to exhibit a fever of 101 ?F with associated tachycardia and leukocytosis of 11.3.  ER provider was concerned  for possible persisting urinary tract infection and early sepsis and initiated intravenous ceftriaxone as well as administering 1 L of normal saline bolus followed by a infusion of lactated Ringer solution.  The hospitalist group was then called to assess the patient for admission to the hospital. ? ? ? ?Review of Systems: Review of Systems  ?Constitutional:  Positive for chills and malaise/fatigue.  ?Gastrointestinal:  Positive for abdominal pain, nausea and vomiting.  ?Genitourinary:  Positive for dysuria.  ?Neurological:  Positive for weakness.  ?All other systems reviewed and are negative. ? ? ?Past Medical History:  ?Diagnosis Date  ? Arteritis ([place])   ? Arthritis   ? Atrophic vaginitis 11/16/2014  ? Carotid artery stenosis   ? Cataract   ? Difficult intubation   ? Environmental and seasonal allergies   ? GERD (gastroesophageal reflux disease)   ? Hyperlipidemia   ? Hypertension   ? Lack of bladder control   ? Neuropathy   ? Osteoporosis   ? Pneumonia   ?  x 10yr ago  ? Pre-diabetes   ? Shingles   ? Ulcer   ? ? ?Past Surgical History:  ?Procedure Laterality Date  ? ABDOMINAL HYSTERECTOMY  1972  ? BREAST CYST EXCISION Right 1972  ? BREAST CYST EXCISION Left 1972  ? ENDARTERECTOMY Left 12/24/2017  ? Procedure: ENDARTERECTOMY CAROTID;  Surgeon: SKatha Cabal MD;  Location: [facility];  Service: Vascular;  Laterality: Left;  ? ENDARTERECTOMY Right 03/13/2018  ? Procedure: ENDARTERECTOMY CAROTID;  Surgeon: SKatha Cabal MD;  Location: [facility];  Service: Vascular;  Laterality: Right;  ? ESOPHAGOGASTRODUODENOSCOPY (EGD) WITH PROPOFOL N/A 11/28/2020  ? Procedure: ESOPHAGOGASTRODUODENOSCOPY (EGD) WITH PROPOFOL;  Surgeon: WLucilla Lame MD;  Location: [facility];  Service: Endoscopy;  Laterality: N/A;  ? EYE SURGERY    ? THROAT SURGERY    ? x 10 yrs ago. throat mass.  ? TONSILLECTOMY    ? ? ?Social History:  reports that she has never smoked. She has never been exposed to tobacco smoke. She has never used  smokeless tobacco. She reports that she does not drink alcohol and does not use drugs. ? ?Allergies  ?Allergen Reactions  ? Amoxicillin-Pot Clavulanate Other (See Comments)  ?  Unknown reaction ?Has patient had a PCN reaction causing immediate rash, facial/tongue/throat swelling, SOB or lightheadedness with hypotension: Unknown ?Has patient had a PCN reaction causing severe rash involving mucus membranes or skin necrosis: Unknown ?Has patient had a PCN reaction that required hospitalization: Unknown ?Has patient had a PCN reaction occurring within the last 10 years: No ?If all of the above answers are "NO", then may proceed with Cephalosporin use. ?  ? Codeine Hives  ? Hydrocodone Nausea Only  ? Metronidazole Nausea Only  ? Propoxyphene Nausea Only  ? Sulfa Antibiotics Hives  ? Latex Rash  ? ? ?Family History  ?Problem Relation Age of Onset  ? Anuerysm Mother   ? Prostate cancer Father   ? Esophageal cancer Sister   ? Lung cancer Sister   ? Diabetes Brother   ? Hearing loss Brother   ? Bladder Cancer Neg Hx   ? Kidney cancer Neg Hx   ? Breast cancer Neg Hx   ? ? ?Prior to Admission medications   ?Medication Sig Start Date End Date Taking? Authorizing Provider  ?acetaminophen (TYLENOL) 500 MG tablet Take 500 mg by mouth daily as needed for headache.   Yes [provider]  ?ALPRAZolam (XANAX) 0.25 MG tablet Take 0.25 mg by mouth at bedtime as needed for sleep.   Yes [provider]  ?ascorbic acid (VITAMIN C) 1000 MG tablet Take 1,000 mg by mouth daily.   Yes [provider]  ?aspirin 81 MG tablet Take 81 mg by mouth daily.   Yes [provider]  ?Cholecalciferol (VITAMIN D3) 1.25 MG (50000 UT) CAPS Take 1 capsule by mouth daily.   Yes [provider]  ?cloNIDine (CATAPRES) 0.1 MG tablet Take 0.1 mg by mouth at bedtime. 07/24/20  Yes [provider]  ?conjugated estrogens (PREMARIN) vaginal cream Apply one pea-sized amount around the opening of the urethra three  times weekly. 10/19/20  Yes Vaillancourt, Aldona Bar, PA-C  ?felodipine (PLENDIL) 2.5 MG 24 hr tablet Take 2.5 mg by mouth daily. May take a second 2.5 mg dose as needed if bp is over 150/90   Yes [provider]  ?fexofenadine (ALLEGRA) 180 MG tablet Take 180 mg by mouth daily as needed for allergies.    Yes [provider]  ?gabapentin (NEURONTIN) 100 MG capsule Take 100 mg by mouth 3 (three) times daily. 08/08/21  Yes [provider]  ?isosorbide mononitrate (IMDUR) 60 MG 24 hr tablet Take 60 mg by mouth daily.   Yes [provider]  ?metoprolol tartrate (LOPRESSOR) 100 MG tablet Take 100 mg by mouth 2 (two) times daily. 09/27/20  Yes [provider]  ?metroNIDAZOLE (METROGEL) 0.75 % vaginal gel Place vaginally at bedtime. 10/16/21  Yes [provider]  ?mirabegron ER (MYRBETRIQ) 25 MG TB24 tablet Take 25 mg by mouth daily.   Yes [provider]  ?ondansetron (ZOFRAN) 4 MG tablet Take 4 mg by mouth every 8 (eight) hours as needed. 12/18/20  Yes [provider]  ?  pantoprazole (PROTONIX) 40 MG tablet Take 1 tablet (40 mg total) by mouth 2 (two) times daily before a meal. 11/28/20 10/27/21 Yes Max Sane, MD  ?predniSONE (DELTASONE) 2.5 MG tablet Take 2.5 mg by mouth daily. 09/26/21  Yes [provider]  ?vitamin E 180 MG (400 UNITS) capsule Take 400 Units by mouth daily.   Yes [provider]  ?hydrochlorothiazide (HYDRODIURIL) 12.5 MG tablet Take 1 tablet by mouth daily. ?Patient not taking: Reported on 10/27/2021 06/15/20   [provider]  ?losartan (COZAAR) 100 MG tablet Take 1 tablet by mouth daily. ?Patient not taking: Reported on 10/27/2021 06/15/20   [provider]  ? ? ?Physical Exam: ? ?Vitals:  ? 10/27/21 2200 10/27/21 2255 10/27/21 2300 10/27/21 2329  ?BP: 139/64  (!) 132/55 (!) 141/62  ?Pulse: 93  92 86  ?Resp: '19  20 20  '$ ?Temp:  99.5 ?F (37.5 ?C)  98 ?F (36.7 ?C)  ?TempSrc:  Oral  Oral  ?SpO2: 96%  98% 99%   ?Weight:      ?Height:      ? ? ?Constitutional: Awake alert and oriented x3, no associated distress.   ?Skin: no rashes, no lesions, poor skin turgor noted. ?Eyes: Pupils are equally reactive to light.  N

## 2021-10-27 NOTE — ED Triage Notes (Signed)
Pt presents from home with nausea and vomiting since 2pm this afternoon. Pt reports generalized abdominal pain. Pt also reports some urinary symptoms like frequency and burning sensation. Reports started to have chills. Pt talks in complete sentences no respiratory distress noted  ?

## 2021-10-27 NOTE — Assessment & Plan Note (Addendum)
?   Patient complaining over a 1 month history of recurrent bouts of nausea vomiting and abdominal pain. ?? Diffuse abdominal tenderness on exam with soft abdomen ?? Patient exhibiting multiple SIRS criteria including leukocytosis ?? CT imaging of the abdomen pelvis revealing minimally distended loops of proximal jejunum, with wall thickening and scattered gas fluid levels concerning for enteritis and ileus ?? Pt continued on meropenem, to complete course of azithromycin and omnicef on d/c ?? Seen by GI with recs for cont abx for pna and UTI  ?? Pt clinically improved and tolerated advancing diet to soft ? ?

## 2021-10-27 NOTE — Assessment & Plan Note (Addendum)
?   Patient been placed on Accu-Cheks before every meal and nightly with sliding scale insulin ?? Patient is diet controlled in the outpatient setting ?? Hemoglobin A1C of 6.0 ? ?

## 2021-10-27 NOTE — Progress Notes (Signed)
Pt being followed by [facility] for Sepsis protocol. 

## 2021-10-27 NOTE — Consult Note (Signed)
PHARMACY -  BRIEF ANTIBIOTIC NOTE  ? ?Pharmacy has received consult(s) for meropenem from an ED provider.  The patient's profile has been reviewed for ht/wt/allergies/indication/available labs.   ? ?Patient has tolerated cephalosporins in the past. One time order placed for ceftriaxone 1gm IV x1. ? ?Further antibiotics/pharmacy consults should be ordered by admitting physician if indicated.       ? ? ?Thank you,    ?Darrick Penna, PharmD, MS PGPM ?Clinical Pharmacist ?10/27/2021 ?9:25 PM ? ? ?

## 2021-10-27 NOTE — Assessment & Plan Note (Signed)
Continuing home regimen of daily PPI therapy.  

## 2021-10-27 NOTE — ED Provider Notes (Signed)
? ?[facility] ?Provider Note ? ? ? Event Date/Time  ? First MD Initiated Contact with Patient 10/27/21 1920   ?  (approximate) ? ?History  ? ?Chief Complaint: Abdominal Pain and Emesis ? ?HPI ? ?Dawn Mcintyre is a 88 y.o. female with a past medical history of hypertension, hyperlipidemia, presents to the emergency department for nausea vomiting diarrhea and dysuria.  According to the patient since last night she has been nauseated with frequent episodes of vomiting has been experiencing intermittent episodes of diarrhea as well.  Patient denies any known sick contacts.  Does state a slight cough today but denies any congestion or known fever.  Patient states slight lower abdominal discomfort and dysuria when she believes she has a urinary tract infection. ? ?Physical Exam  ? ?Triage Vital Signs: ?ED Triage Vitals  ?Enc Vitals Group  ?   BP 10/27/21 1910 (!) 175/69  ?   Pulse Rate 10/27/21 1910 77  ?   Resp 10/27/21 1910 20  ?   Temp 10/27/21 1910 98.5 ?F (36.9 ?C)  ?   Temp Source 10/27/21 1910 Oral  ?   SpO2 10/27/21 1910 95 %  ?   Weight 10/27/21 1914 137 lb (62.1 kg)  ?   Height 10/27/21 1914 '5\' 7"'$  (1.702 m)  ?   Head Circumference --   ?   Peak Flow --   ?   Pain Score 10/27/21 1913 6  ?   Pain Loc --   ?   Pain Edu? --   ?   Excl. in [place]? --   ? ? ?Most recent vital signs: ?Vitals:  ? 10/27/21 1910  ?BP: (!) 175/69  ?Pulse: 77  ?Resp: 20  ?Temp: 98.5 ?F (36.9 ?C)  ?SpO2: 95%  ? ? ?General: Awake, no distress.  ?CV:  Good peripheral perfusion.  Regular rate and rhythm  ?Resp:  Normal effort.  Equal breath sounds bilaterally.  ?Abd:  No distention.  Soft, nontender.  No rebound or guarding.  Benign abdomen. ? ? ?ED Results / Procedures / Treatments  ? ? ?MEDICATIONS ORDERED IN ED: ?Medications - No data to display ? ? ?IMPRESSION / MDM / ASSESSMENT AND PLAN / ED COURSE  ?I reviewed the triage vital signs and the nursing notes. ? ?Patient presents emergency department for nausea vomiting  diarrhea since last night.  Patient also states 2 to 3 days of lower abdominal discomfort and dysuria.  Patient has benign abdominal exam.  Reassuring physical exam.  Reassuring vital signs besides hypertension.  We will check labs, urinalysis.  We will treat with IV Zofran and fluids and continue to closely monitor.  We will also obtain a COVID/flu swab.  Differential would include gastroenteritis, COVID, UTI, among other intra-abdominal pathologies. ? ?Patient's labs have resulted showing a mild lipase elevation 109.  Chemistry is normal with normal LFTs.  Patient CBC does show mild white blood cell count elevation 11.3.  Patient is now febrile in the emergency department to 101.  Given the patient's elevated heart rate respiratory rate now with fever we will check blood cultures start the patient on antibiotics.  Highly suspect urinary tract infection.  Patient satting 92% currently on room air we will obtain a chest x-ray and continue to closely monitor. ? ?Urinalysis shows many bacteria with 11-20 white and red cells likely indicating urinary tract infection.  I reviewed the chest x-ray images, no acute abnormality seen on my evaluation.  Radiology is read as left basilar  atelectasis ?COVID and flu are negative.  Patient will be admitted to the hospital service for further work-up and treatment. ? ?CRITICAL CARE ?Performed by: Harvest Dark ? ? ?Total critical care time: 30 minutes ? ?Critical care time was exclusive of separately billable procedures and treating other patients. ? ?Critical care was necessary to treat or prevent imminent or life-threatening deterioration. ? ?Critical care was time spent personally by me on the following activities: development of treatment plan with patient and/or surrogate as well as nursing, discussions with consultants, evaluation of patient's response to treatment, examination of patient, obtaining history from patient or surrogate, ordering and performing treatments  and interventions, ordering and review of laboratory studies, ordering and review of radiographic studies, pulse oximetry and re-evaluation of patient's condition. ? ? ?FINAL CLINICAL IMPRESSION(S) / ED DIAGNOSES  ? ?Nausea vomiting diarrhea ?Dysuria ?Sepsis ? ? ? ?Note:  This document was prepared using Dragon voice recognition software and may include unintentional dictation errors. ?  Harvest Dark, MD ?10/27/21 2211 ? ?

## 2021-10-27 NOTE — Assessment & Plan Note (Addendum)
.   Strict intake and output monitoring . Creatinine near baseline  

## 2021-10-27 NOTE — ED Notes (Signed)
Patient transported to CT 

## 2021-10-27 NOTE — Assessment & Plan Note (Addendum)
?   Continue outpatient regimen of daily low-dose prednisone. ?? Denies headaches ?

## 2021-10-27 NOTE — Assessment & Plan Note (Addendum)
?   Continue patients home regimen of oral antihypertensives ?? BP stable at this time ? ? ?

## 2021-10-28 ENCOUNTER — Observation Stay: Payer: Medicare Other

## 2021-10-28 DIAGNOSIS — E876 Hypokalemia: Secondary | ICD-10-CM | POA: Diagnosis present

## 2021-10-28 DIAGNOSIS — N390 Urinary tract infection, site not specified: Secondary | ICD-10-CM | POA: Diagnosis present

## 2021-10-28 DIAGNOSIS — I129 Hypertensive chronic kidney disease with stage 1 through stage 4 chronic kidney disease, or unspecified chronic kidney disease: Secondary | ICD-10-CM | POA: Diagnosis present

## 2021-10-28 DIAGNOSIS — B999 Unspecified infectious disease: Secondary | ICD-10-CM | POA: Diagnosis present

## 2021-10-28 DIAGNOSIS — Z880 Allergy status to penicillin: Secondary | ICD-10-CM | POA: Diagnosis not present

## 2021-10-28 DIAGNOSIS — J189 Pneumonia, unspecified organism: Secondary | ICD-10-CM | POA: Diagnosis present

## 2021-10-28 DIAGNOSIS — K219 Gastro-esophageal reflux disease without esophagitis: Secondary | ICD-10-CM | POA: Diagnosis present

## 2021-10-28 DIAGNOSIS — A09 Infectious gastroenteritis and colitis, unspecified: Secondary | ICD-10-CM | POA: Diagnosis present

## 2021-10-28 DIAGNOSIS — Z885 Allergy status to narcotic agent status: Secondary | ICD-10-CM | POA: Diagnosis not present

## 2021-10-28 DIAGNOSIS — Z20822 Contact with and (suspected) exposure to covid-19: Secondary | ICD-10-CM | POA: Diagnosis present

## 2021-10-28 DIAGNOSIS — M316 Other giant cell arteritis: Secondary | ICD-10-CM | POA: Diagnosis present

## 2021-10-28 DIAGNOSIS — I10 Essential (primary) hypertension: Secondary | ICD-10-CM | POA: Diagnosis not present

## 2021-10-28 DIAGNOSIS — N2581 Secondary hyperparathyroidism of renal origin: Secondary | ICD-10-CM | POA: Diagnosis present

## 2021-10-28 DIAGNOSIS — E1142 Type 2 diabetes mellitus with diabetic polyneuropathy: Secondary | ICD-10-CM | POA: Diagnosis present

## 2021-10-28 DIAGNOSIS — Z91040 Latex allergy status: Secondary | ICD-10-CM | POA: Diagnosis not present

## 2021-10-28 DIAGNOSIS — K567 Ileus, unspecified: Secondary | ICD-10-CM | POA: Diagnosis present

## 2021-10-28 DIAGNOSIS — E1122 Type 2 diabetes mellitus with diabetic chronic kidney disease: Secondary | ICD-10-CM | POA: Diagnosis present

## 2021-10-28 DIAGNOSIS — D631 Anemia in chronic kidney disease: Secondary | ICD-10-CM | POA: Diagnosis present

## 2021-10-28 DIAGNOSIS — N1831 Chronic kidney disease, stage 3a: Secondary | ICD-10-CM | POA: Diagnosis present

## 2021-10-28 DIAGNOSIS — A419 Sepsis, unspecified organism: Secondary | ICD-10-CM | POA: Diagnosis present

## 2021-10-28 DIAGNOSIS — E785 Hyperlipidemia, unspecified: Secondary | ICD-10-CM | POA: Diagnosis present

## 2021-10-28 DIAGNOSIS — Z833 Family history of diabetes mellitus: Secondary | ICD-10-CM | POA: Diagnosis not present

## 2021-10-28 DIAGNOSIS — Z7984 Long term (current) use of oral hypoglycemic drugs: Secondary | ICD-10-CM | POA: Diagnosis not present

## 2021-10-28 DIAGNOSIS — Z7952 Long term (current) use of systemic steroids: Secondary | ICD-10-CM | POA: Diagnosis not present

## 2021-10-28 DIAGNOSIS — Z882 Allergy status to sulfonamides status: Secondary | ICD-10-CM | POA: Diagnosis not present

## 2021-10-28 LAB — COMPREHENSIVE METABOLIC PANEL
ALT: 11 U/L (ref 0–44)
AST: 21 U/L (ref 15–41)
Albumin: 3 g/dL — ABNORMAL LOW (ref 3.5–5.0)
Alkaline Phosphatase: 44 U/L (ref 38–126)
Anion gap: 8 (ref 5–15)
BUN: 18 mg/dL (ref 8–23)
CO2: 24 mmol/L (ref 22–32)
Calcium: 8.5 mg/dL — ABNORMAL LOW (ref 8.9–10.3)
Chloride: 107 mmol/L (ref 98–111)
Creatinine, Ser: 0.85 mg/dL (ref 0.44–1.00)
GFR, Estimated: >60 mL/min (ref >60)
Glucose, Bld: 112 mg/dL — ABNORMAL HIGH (ref 70–99)
Potassium: 3.2 mmol/L — ABNORMAL LOW (ref 3.5–5.1)
Sodium: 139 mmol/L (ref 135–145)
Total Bilirubin: 0.8 mg/dL (ref 0.3–1.2)
Total Protein: 5.3 g/dL — ABNORMAL LOW (ref 6.5–8.1)

## 2021-10-28 LAB — CBC WITH DIFFERENTIAL/PLATELET
Abs Immature Granulocytes: 0.04 10*3/uL (ref 0.00–0.07)
Basophils Absolute: 0 10*3/uL (ref 0.0–0.1)
Basophils Relative: 0 %
Eosinophils Absolute: 0.1 10*3/uL (ref 0.0–0.5)
Eosinophils Relative: 1 %
HCT: 32.8 % — ABNORMAL LOW (ref 36.0–46.0)
Hemoglobin: 10.5 g/dL — ABNORMAL LOW (ref 12.0–15.0)
Immature Granulocytes: 0 %
Lymphocytes Relative: 2 %
Lymphs Abs: 0.2 10*3/uL — ABNORMAL LOW (ref 0.7–4.0)
MCH: 29.3 pg (ref 26.0–34.0)
MCHC: 32 g/dL (ref 30.0–36.0)
MCV: 91.6 fL (ref 80.0–100.0)
Monocytes Absolute: 0.4 10*3/uL (ref 0.1–1.0)
Monocytes Relative: 4 %
Neutro Abs: 8.8 10*3/uL — ABNORMAL HIGH (ref 1.7–7.7)
Neutrophils Relative %: 93 %
Platelets: 161 10*3/uL (ref 150–400)
RBC: 3.58 MIL/uL — ABNORMAL LOW (ref 3.87–5.11)
RDW: 13 % (ref 11.5–15.5)
WBC: 9.5 10*3/uL (ref 4.0–10.5)
nRBC: 0 % (ref 0.0–0.2)

## 2021-10-28 LAB — GLUCOSE, CAPILLARY
Glucose-Capillary: 96 mg/dL (ref 70–99)
Glucose-Capillary: 92 mg/dL (ref 70–99)
Glucose-Capillary: 90 mg/dL (ref 70–99)
Glucose-Capillary: 116 mg/dL — ABNORMAL HIGH (ref 70–99)

## 2021-10-28 LAB — LACTIC ACID, PLASMA: Lactic Acid, Venous: 1.6 mmol/L (ref 0.5–1.9)

## 2021-10-28 LAB — HEMOGLOBIN A1C
Hgb A1c MFr Bld: 6 % — ABNORMAL HIGH (ref 4.8–5.6)
Mean Plasma Glucose: 125.5 mg/dL

## 2021-10-28 LAB — APTT: aPTT: 49 seconds — ABNORMAL HIGH (ref 24–36)

## 2021-10-28 LAB — PROTIME-INR
INR: 1.1 (ref 0.8–1.2)
Prothrombin Time: 14.4 seconds (ref 11.4–15.2)

## 2021-10-28 LAB — MAGNESIUM: Magnesium: 1.1 mg/dL — ABNORMAL LOW (ref 1.7–2.4)

## 2021-10-28 MED ORDER — MAGNESIUM SULFATE 4 GM/100ML IV SOLN
4.0000 g | Freq: Once | INTRAVENOUS | Status: AC
  Administered 2021-10-28: 4 g via INTRAVENOUS
  Filled 2021-10-28: qty 100

## 2021-10-28 MED ORDER — LACTATED RINGERS IV SOLN: INTRAVENOUS | Status: AC

## 2021-10-28 MED ORDER — PANTOPRAZOLE SODIUM 40 MG IV SOLR
40.0000 mg | Freq: Two times a day (BID) | INTRAVENOUS | Status: DC
  Administered 2021-10-28 – 2021-10-29 (×4): 40 mg via INTRAVENOUS
  Filled 2021-10-28 (×4): qty 10

## 2021-10-28 MED ORDER — POTASSIUM CHLORIDE CRYS ER 20 MEQ PO TBCR
40.0000 meq | EXTENDED_RELEASE_TABLET | ORAL | Status: AC
  Administered 2021-10-28 (×2): 40 meq via ORAL
  Filled 2021-10-28 (×2): qty 2

## 2021-10-28 MED ORDER — SODIUM CHLORIDE 0.9 % IV SOLN
1.0000 g | Freq: Two times a day (BID) | INTRAVENOUS | Status: DC
  Administered 2021-10-28 – 2021-10-29 (×3): 1 g via INTRAVENOUS
  Filled 2021-10-28 (×2): qty 20
  Filled 2021-10-28: qty 1
  Filled 2021-10-28: qty 20

## 2021-10-28 NOTE — Assessment & Plan Note (Addendum)
replaced

## 2021-10-28 NOTE — Assessment & Plan Note (Addendum)
·   Please see assessment and plan above °

## 2021-10-28 NOTE — Consult Note (Signed)
?  ?Dawn Mcintyre , MD ?[address] [place], [place], [place], [address] ?[address], [place], [place], [place], [address] ?Phone: [phone]  ?Fax: [phone] ? Consultation ? ?Referring Provider:   Dr Marlyce Huge ?Primary Care Physician:  Idelle Crouch, MD ?Primary Gastroenterologist:  Dr. Alice Reichert         ?Reason for Consultation:     Enteritis ? ?Date of Admission:  10/27/2021 ?Date of Consultation:  10/28/2021 ?       ? HPI:   ?Dawn Mcintyre is a 88 y.o. female who is a patient of Dr. Alice Reichert at [facility] last seen at their office in July 2022 has previously been seen and evaluated for bacterial overgrowth syndrome.  She intended to be treated with Xifaxan but was not covered so was treated with a course of Bactrim and subsequently felt much better.  And was asked to follow-up as needed. ? ? ?She has a history of temporal arteritis on chronic steroids, diabetes, hyperparathyroidism CKD presented to the emergency room with nausea vomiting abdominal pain.  Treated with Keflex in February 2020 for UTI and in March 2023 treated with a course of ciprofloxacin for UTI at that time she had abdominal discomfort and was given a course of Flagyl. ? ?When she presented to the ER she had a fever, leukocytosis and was empirically treated for UTI.  She had a CT abdomen and pelvis with contrast that showed minimally distended loops of proximal jejunum with nonspecific wall thickening and scattered gas fluid levels featuring mild enteritis and ileus.She had an x-ray which showed streaky opacity left lung base concerning for pneumonia with small parapneumonic effusion. ? ?She says all of a sudden the day before hospitalization developed nausea, vomiting , diarrhea non bloody and that resolved within 24 hours, denies any nsaid use. No abdominal pain or GI symptoms presently. Not on any home oxygen but requires some now.  ? ?This morning her white cell count is 9.5 with a hemoglobin of 10.5 which is  at her baseline.  Magnesium is low at 1.1.  Creatinine is 0.85 at baseline potassium is low at 3.2 liver function tests are normal except a low serum albumin of 3 lactic acid levels are normal.  Blood cultures so far negative urine analysis shows large protein and blood in the urine with leukocytes and many bacteria. ? ?Past Medical History:  ?Diagnosis Date  ? Arteritis ([place])   ? Arthritis   ? Atrophic vaginitis 11/16/2014  ? Carotid artery stenosis   ? Cataract   ? Difficult intubation   ? Environmental and seasonal allergies   ? GERD (gastroesophageal reflux disease)   ? Hyperlipidemia   ? Hypertension   ? Lack of bladder control   ? Neuropathy   ? Osteoporosis   ? Pneumonia   ?  x 39yr ago  ? Pre-diabetes   ? Shingles   ? Ulcer   ? ? ?Past Surgical History:  ?Procedure Laterality Date  ? ABDOMINAL HYSTERECTOMY  1972  ? BREAST CYST EXCISION Right 1972  ? BREAST CYST EXCISION Left 1972  ? ENDARTERECTOMY Left 12/24/2017  ? Procedure: ENDARTERECTOMY CAROTID;  Surgeon: SKatha Cabal MD;  Location: [facility] ORS;  Service: Vascular;  Laterality: Left;  ? ENDARTERECTOMY Right 03/13/2018  ? Procedure: ENDARTERECTOMY CAROTID;  Surgeon: SKatha Cabal MD;  Location: [facility] ORS;  Service: Vascular;  Laterality: Right;  ? ESOPHAGOGASTRODUODENOSCOPY (EGD) WITH PROPOFOL N/A 11/28/2020  ? Procedure: ESOPHAGOGASTRODUODENOSCOPY (EGD) WITH PROPOFOL;  Surgeon: WAllen Norris  Darren, MD;  Location: [facility];  Service: Endoscopy;  Laterality: N/A;  ? EYE SURGERY    ? THROAT SURGERY    ? x 10 yrs ago. throat mass.  ? TONSILLECTOMY    ? ? ?Prior to Admission medications   ?Medication Sig Start Date End Date Taking? Authorizing Provider  ?acetaminophen (TYLENOL) 500 MG tablet Take 500 mg by mouth daily as needed for headache.   Yes [provider]  ?ALPRAZolam (XANAX) 0.25 MG tablet Take 0.25 mg by mouth at bedtime as needed for sleep.   Yes [provider]  ?ascorbic acid (VITAMIN C) 1000 MG tablet Take 1,000 mg by  mouth daily.   Yes [provider]  ?aspirin 81 MG tablet Take 81 mg by mouth daily.   Yes [provider]  ?Cholecalciferol (VITAMIN D3) 1.25 MG (50000 UT) CAPS Take 1 capsule by mouth daily.   Yes [provider]  ?cloNIDine (CATAPRES) 0.1 MG tablet Take 0.1 mg by mouth at bedtime. 07/24/20  Yes [provider]  ?conjugated estrogens (PREMARIN) vaginal cream Apply one pea-sized amount around the opening of the urethra three times weekly. 10/19/20  Yes Vaillancourt, Aldona Bar, PA-C  ?felodipine (PLENDIL) 2.5 MG 24 hr tablet Take 2.5 mg by mouth daily. May take a second 2.5 mg dose as needed if bp is over 150/90   Yes [provider]  ?fexofenadine (ALLEGRA) 180 MG tablet Take 180 mg by mouth daily as needed for allergies.    Yes [provider]  ?gabapentin (NEURONTIN) 100 MG capsule Take 100 mg by mouth 3 (three) times daily. 08/08/21  Yes [provider]  ?isosorbide mononitrate (IMDUR) 60 MG 24 hr tablet Take 60 mg by mouth daily.   Yes [provider]  ?metoprolol tartrate (LOPRESSOR) 100 MG tablet Take 100 mg by mouth 2 (two) times daily. 09/27/20  Yes [provider]  ?metroNIDAZOLE (METROGEL) 0.75 % vaginal gel Place vaginally at bedtime. 10/16/21  Yes [provider]  ?mirabegron ER (MYRBETRIQ) 25 MG TB24 tablet Take 25 mg by mouth daily.   Yes [provider]  ?ondansetron (ZOFRAN) 4 MG tablet Take 4 mg by mouth every 8 (eight) hours as needed. 12/18/20  Yes [provider]  ?pantoprazole (PROTONIX) 40 MG tablet Take 1 tablet (40 mg total) by mouth 2 (two) times daily before a meal. 11/28/20 10/27/21 Yes Max Sane, MD  ?predniSONE (DELTASONE) 2.5 MG tablet Take 2.5 mg by mouth daily. 09/26/21  Yes [provider]  ?vitamin E 180 MG (400 UNITS) capsule Take 400 Units by mouth daily.   Yes [provider]  ?hydrochlorothiazide (HYDRODIURIL) 12.5 MG tablet Take 1 tablet by mouth  daily. ?Patient not taking: Reported on 10/27/2021 06/15/20   [provider]  ?losartan (COZAAR) 100 MG tablet Take 1 tablet by mouth daily. ?Patient not taking: Reported on 10/27/2021 06/15/20   [provider]  ? ? ?Family History  ?Problem Relation Age of Onset  ? Anuerysm Mother   ? Prostate cancer Father   ? Esophageal cancer Sister   ? Lung cancer Sister   ? Diabetes Brother   ? Hearing loss Brother   ? Bladder Cancer Neg Hx   ? Kidney cancer Neg Hx   ? Breast cancer Neg Hx   ?  ? ?Social History  ? ?Tobacco Use  ? Smoking status: Never  ?  Passive exposure: Never  ? Smokeless tobacco: Never  ?Vaping Use  ? Vaping Use: Never used  ?  Substance Use Topics  ? Alcohol use: No  ?  Alcohol/week: 0.0 standard drinks  ? Drug use: No  ? ? ?Allergies as of 10/27/2021 - Review Complete 10/27/2021  ?Allergen Reaction Noted  ? Amoxicillin-pot clavulanate Other (See Comments) 05/06/2015  ? Codeine Hives 04/11/2011  ? Hydrocodone Nausea Only 05/06/2015  ? Metronidazole Nausea Only 05/06/2015  ? Propoxyphene Nausea Only 05/06/2015  ? Sulfa antibiotics Hives 09/08/2020  ? Latex Rash 05/06/2015  ? ? ?Review of Systems:    ?All systems reviewed and negative except where noted in HPI. ? ? Physical Exam:  ?Vital signs in last 24 hours: ?Temp:  [97.9 ?F (36.6 ?C)-101 ?F (38.3 ?C)] 98.1 ?F (36.7 ?C) (03/26 0758) ?Pulse Rate:  [71-94] 73 (03/26 0758) ?Resp:  [16-28] 17 (03/26 0758) ?BP: (106-175)/(41-71) 129/47 (03/26 0758) ?SpO2:  [92 %-99 %] 99 % (03/26 0758) ?Weight:  [62.1 kg] 62.1 kg (03/25 1914) ?  ?General:   Pleasant, cooperative in NAD ?Head:  Normocephalic and atraumatic. ?Eyes:   No icterus.   Conjunctiva pink. PERRLA. ?Ears:  Normal auditory acuity. ?Neck:  Supple; no masses or thyroidomegaly ?Lungs: Respirations even and unlabored. Lungs clear to auscultation bilaterally.   No wheezes, crackles, or rhonchi.  ?Heart:  Regular rate and rhythm;  Without murmur, clicks, rubs or gallops ?Abdomen:  Soft,  nondistended, nontender. Normal bowel sounds. No appreciable masses or hepatomegaly.  No rebound or guarding.  ?Neurologic:  Alert and oriented x3;  grossly normal neurologically. ?Psych:  Alert and cooperative. Normal affect

## 2021-10-28 NOTE — Assessment & Plan Note (Addendum)
-  presents with dysuria with UA suggestive of UTI ?-Follow up on pan cultures ?-Initially on meropenem. Transitioned to azithromycin and omnicef on d/c per above ?

## 2021-10-28 NOTE — Assessment & Plan Note (Addendum)
?   Patient exhibiting multiple SIRS criteria with evidence of infectious enteritis all concerning for sepsis. ?? Urine cx pos for ecoli. Clinically improving on abx ?

## 2021-10-28 NOTE — Progress Notes (Signed)
?Progress Note ? ? ?Patient: Dawn Mcintyre GQQ:[id number] DOB: 1933/05/26 DOA: 10/27/2021     0 ?DOS: the patient was seen and examined on 10/28/2021 ?  ?Brief hospital course: ?88 year old female with past medical history of temporal arteritis on chronic prednisone therapy, gastroesophageal reflux disease, non-insulin-dependent diabetes mellitus type 2, secondary hyperparathyroidism, anemia of chronic disease, hypertension, hyperlipidemia, chronic kidney disease stage IIIa presenting to [facility] emergency department with complaints of nausea vomiting and abdominal pain. In the ED, pt was noted to have fever of 101 ?F with associated tachycardia and leukocytosis of 11.3. ? ?Assessment and Plan: ?* Enteritis of infectious origin ?Patient complaining over a 1 month history of recurrent bouts of nausea vomiting and abdominal pain. ?Diffuse abdominal tenderness on exam with soft abdomen ?Patient exhibiting multiple SIRS criteria including leukocytosis ?CT imaging of the abdomen pelvis revealing minimally distended loops of proximal jejunum, with wall thickening and scattered gas fluid levels concerning for enteritis and ileus ?Pt continued on meropenem  ?Seen by GI with recs for cont abx for pna and UTI and to correct lytes as needed. Per GI, if pt has n/v, to keep NPO and if significant vomiting, then may need NG ? ? ?Sepsis ([place]) ?Patient exhibiting multiple SIRS criteria with evidence of infectious enteritis all concerning for sepsis. ?F/u on pan culures ? ?Ileus due to infection [facility]) ?Please see assessment and plan above ? ?Essential hypertension ?Continue patients home regimen of oral antihypertensives ?BP stable at this time ? ? ? ?Chronic kidney disease, stage 3a ([place]) ?Strict intake and output monitoring ?Creatinine near baseline ?Recheck bmet in AM ? ? ?Type 2 diabetes mellitus with stage 3a chronic kidney disease, without long-term current use of insulin ([place]) ?Patient been placed on Accu-Cheks before every meal  and nightly with sliding scale insulin ?Patient is diet controlled in the outpatient setting ?Hemoglobin A1C of 6.0 ?Diabetic Diet ? ? ?GERD without esophagitis ?Continuing home regimen of daily PPI therapy. ? ? ?Temporal arteritis ([place]) ?Continue outpatient regimen of daily low-dose prednisone. ?Denies headaches ? ?Pneumonia ?-Currently on 2LNC. Pt is o2 naive ?-abd xray with findings suggestive of PNA at the L base ?-Cont abx as tolerated ?-Wean O2 as tolerated ? ?Hypomagnesemia ?-Mg of 1.1 today ?-Replaced ? ?Hypokalemia ?-K of 3.2 today ?-Will replace ?-Repeat bmet in AM ? ?UTI (urinary tract infection) ?-presents with dysuria with UA suggestive of UTI ?-Follow up on pan cultures ?-Pt has hx of PCN and flagyl allergies. Currently on meropenem ? ? ? ? ?  ? ?Subjective: Denies current abd pain. Denies coughing ? ?Physical Exam: ?Vitals:  ? 10/28/21 0505 10/28/21 0720 10/28/21 0758 10/28/21 1134  ?BP: (!) 106/41 (!) 121/54 (!) 129/47 (!) 120/46  ?Pulse: 78 71 73 68  ?Resp: '18 16 17 15  '$ ?Temp: 98.3 ?F (36.8 ?C) 97.9 ?F (36.6 ?C) 98.1 ?F (36.7 ?C) 99.3 ?F (37.4 ?C)  ?TempSrc:  Oral Oral Oral  ?SpO2: 96% 99% 99% 99%  ?Weight:      ?Height:      ? ?General exam: Awake, laying in bed, in nad ?Respiratory system: Normal respiratory effort, no wheezing ?Cardiovascular system: regular rate, s1, s2 ?Gastrointestinal system: Soft, nondistended, positive BS ?Central nervous system: CN2-12 grossly intact, strength intact ?Extremities: Perfused, no clubbing ?Skin: Normal skin turgor, no notable skin lesions seen ?Psychiatry: Mood normal // no visual hallucinations  ? ?Data Reviewed: ? ?Labs reviewed: K of 3.2, Mg 1.1 ? ?Family Communication: Pt in room, family not at bedside ? ?Disposition: ?Status is: Observation ?  The patient will require care spanning > 2 midnights and should be moved to inpatient because: Severity of illness ? Planned Discharge Destination: Barriers to discharge: Pending PT eval ? ? ? ?Author: ?Marylu Lund,  MD ?10/28/2021 1:50 PM ? ?For on call review [email or web address].  ?

## 2021-10-28 NOTE — Assessment & Plan Note (Addendum)
-  Currently on [facility]. Pt is o2 naive ?-abd xray with findings suggestive of PNA at the L base ?-Continued above abx. Weaned to room air ?

## 2021-10-28 NOTE — Assessment & Plan Note (Addendum)
-  K of 3.2 today ?-Replaced ?

## 2021-10-28 NOTE — Progress Notes (Signed)
Pharmacy Antibiotic Note ? ?Dawn Mcintyre is a 88 y.o. female admitted on 10/27/2021 with intra-abdominal infection.  Pharmacy has been consulted for meropenem dosing. ? ?Plan: ?Meropenem 1 gm q12h per indication and renal fxn. ?Pharmacy will continue to follow and will adjust abx dosing whenever warranted. ? ?Height: '5\' 7"'$  (170.2 cm) ?Weight: 62.1 kg (137 lb) ?IBW/kg (Calculated) : 61.6 ? ?Temp (24hrs), Avg:99.3 ?F (37.4 ?C), Min:98 ?F (36.7 ?C), Max:101 ?F (38.3 ?C) ? ?Recent Labs  ?Lab 10/27/21 ?1947 10/27/21 ?2159 10/27/21 ?2352  ?WBC 11.3*  --   --   ?CREATININE 0.91  --   --   ?LATICACIDVEN  --  1.9 1.6  ?  ?Estimated Creatinine Clearance: 43.2 mL/min (by C-G formula based on SCr of 0.91 mg/dL).   ? ?Allergies  ?Allergen Reactions  ? Amoxicillin-Pot Clavulanate Other (See Comments)  ?  Unknown reaction ?Has patient had a PCN reaction causing immediate rash, facial/tongue/throat swelling, SOB or lightheadedness with hypotension: Unknown ?Has patient had a PCN reaction causing severe rash involving mucus membranes or skin necrosis: Unknown ?Has patient had a PCN reaction that required hospitalization: Unknown ?Has patient had a PCN reaction occurring within the last 10 years: No ?If all of the above answers are "NO", then may proceed with Cephalosporin use. ?  ? Codeine Hives  ? Hydrocodone Nausea Only  ? Metronidazole Nausea Only  ? Propoxyphene Nausea Only  ? Sulfa Antibiotics Hives  ? Latex Rash  ? ? ?Antimicrobials this admission: ?3/25 Ceftriaxone >> x 1 dose ?3/26 Meropenem >>  ? ?Microbiology results: ?3/25 BCx: Pending ?3/25 UCx: Pending  ? ?Thank you for allowing pharmacy to be a part of this patient?s care. ? ?Renda Rolls, PharmD, MBA ?10/28/2021 ?2:31 AM ? ? ?

## 2021-10-29 LAB — GLUCOSE, CAPILLARY
Glucose-Capillary: 70 mg/dL (ref 70–99)
Glucose-Capillary: 118 mg/dL — ABNORMAL HIGH (ref 70–99)

## 2021-10-29 LAB — COMPREHENSIVE METABOLIC PANEL
ALT: 10 U/L (ref 0–44)
AST: 16 U/L (ref 15–41)
Albumin: 2.8 g/dL — ABNORMAL LOW (ref 3.5–5.0)
Alkaline Phosphatase: 42 U/L (ref 38–126)
Anion gap: 5 (ref 5–15)
BUN: 14 mg/dL (ref 8–23)
CO2: 27 mmol/L (ref 22–32)
Calcium: 9.2 mg/dL (ref 8.9–10.3)
Chloride: 108 mmol/L (ref 98–111)
Creatinine, Ser: 0.73 mg/dL (ref 0.44–1.00)
GFR, Estimated: >60 mL/min (ref >60)
Glucose, Bld: 85 mg/dL (ref 70–99)
Potassium: 4.2 mmol/L (ref 3.5–5.1)
Sodium: 140 mmol/L (ref 135–145)
Total Bilirubin: 0.7 mg/dL (ref 0.3–1.2)
Total Protein: 5.1 g/dL — ABNORMAL LOW (ref 6.5–8.1)

## 2021-10-29 LAB — CBC
HCT: 32.6 % — ABNORMAL LOW (ref 36.0–46.0)
Hemoglobin: 10.1 g/dL — ABNORMAL LOW (ref 12.0–15.0)
MCH: 29 pg (ref 26.0–34.0)
MCHC: 31 g/dL (ref 30.0–36.0)
MCV: 93.7 fL (ref 80.0–100.0)
Platelets: 134 10*3/uL — ABNORMAL LOW (ref 150–400)
RBC: 3.48 MIL/uL — ABNORMAL LOW (ref 3.87–5.11)
RDW: 13.3 % (ref 11.5–15.5)
WBC: 6.9 10*3/uL (ref 4.0–10.5)
nRBC: 0 % (ref 0.0–0.2)

## 2021-10-29 MED ORDER — CEFDINIR 300 MG PO CAPS: 300.0000 mg | ORAL_CAPSULE | Freq: Two times a day (BID) | ORAL | 0 refills | Status: AC

## 2021-10-29 MED ORDER — AZITHROMYCIN 500 MG PO TABS
500.0000 mg | ORAL_TABLET | Freq: Every day | ORAL | Status: DC
  Administered 2021-10-29: 500 mg via ORAL
  Filled 2021-10-29: qty 1

## 2021-10-29 MED ORDER — AZITHROMYCIN 500 MG PO TABS: 500.0000 mg | ORAL_TABLET | Freq: Every day | ORAL | 0 refills | Status: AC

## 2021-10-29 MED ORDER — SODIUM CHLORIDE 0.9 % IV SOLN
2.0000 g | Freq: Every day | INTRAVENOUS | Status: DC
  Filled 2021-10-29: qty 20

## 2021-10-29 NOTE — Clinical Social Work Note (Signed)
RE:  Dawn Mcintyre   ?Date of Birth:   [birth year]  ?Date: 10/29/2021 ? ?To Whom It May Concern: ? ?Please be advised that the above-named patient will require a short-term nursing home stay - anticipated 30 days or less for rehabilitation and strengthening.  The plan is for return home. ? ?MD electronic signature noted below ?

## 2021-10-29 NOTE — Evaluation (Signed)
Physical Therapy Evaluation ?Patient Details ?Name: Dawn Mcintyre ?MRN: [id number] ?DOB: [birth year] ?Today's Date: 10/29/2021 ? ?History of Present Illness ? Pt is an 88 y.o. female presenting to hospital 3/25 with abdominal pain, nausea, emesis, diarrhea, and dysuria.  Pt admitted with enteritis of infectious origin, sepsis, ileus d/t infection, PNA, hypomagnesemia, hypokalemia, and UTI.  PMH includ htn, HLD, temporal arteritis, DM, anemia, CKD, neuropathy, shingles, carotid endarterectomy, throat surgery.  ?Clinical Impression ? Prior to hospital admission, pt was independent with functional mobility; lives alone in 1 level home with level entry; and has aide assist M-F for 2 hours per day (will be increasing to 4 hours per day soon).  No c/o pain during session.  Currently pt is modified independent semi-supine to sitting edge of bed; CGA with transfers; and CGA progressing to SBA ambulating 100 feet with RW use.  Pt unsteady attempting to ambulate without UE support but appeared steady and safe ambulating with RW use.  Pt's O2 sats 93% or greater on room air during sessions activities (pt left on room air end of session--discussed with pt's nurse who was agreeable to this).  Pt would benefit from skilled PT to address noted impairments and functional limitations (see below for any additional details).  Upon hospital discharge, pt would benefit from [place] (pt reports being agreeable to [place]).    ? ?Recommendations for follow up therapy are one component of a multi-disciplinary discharge planning process, led by the attending physician.  Recommendations may be updated based on patient status, additional functional criteria and insurance authorization. ? ?Follow Up Recommendations Home health PT ? ?  ?Assistance Recommended at Discharge PRN  ?Patient can return home with the following ? Assistance with cooking/housework;Assist for transportation ? ?  ?Equipment Recommendations  (pt has own RW at home already)   ?Recommendations for Other Services ?    ?  ?Functional Status Assessment Patient has had a recent decline in their functional status and demonstrates the ability to make significant improvements in function in a reasonable and predictable amount of time.  ? ?  ?Precautions / Restrictions Precautions ?Precautions: Fall ?Restrictions ?Weight Bearing Restrictions: No  ? ?  ? ?Mobility ? Bed Mobility ?Overal bed mobility: Modified Independent ?  ?  ?  ?  ?  ?  ?General bed mobility comments: Semi-supine to sitting edge of bed without any noted difficulties. ?  ? ?Transfers ?Overall transfer level: Needs assistance ?Equipment used: None ?Transfers: Sit to/from Stand, Bed to chair/wheelchair/BSC ?Sit to Stand: Min guard ?  ?Step pivot transfers: Min guard (to toilet) ?  ?  ?  ?General transfer comment: pt requiring single UE support for balance standing from bed x1 trial and from toilet x1 trial ?  ? ?Ambulation/Gait ?Ambulation/Gait assistance: Min guard, Supervision ?Gait Distance (Feet):  (20 feet to bathroom; 100 feet in room) ?Assistive device: Rolling walker (2 wheels) ?  ?Gait velocity: decreased ?  ?  ?General Gait Details: pt ambulated 20 feet with hand hold assist to bathroom (assist required for balance); pt then ambulate 100 feet in room with RW CGA progressing to SBA (pt steady ambulating with RW use); partial to step through gait pattern using RW ? ?Stairs ?  ?  ?  ?  ?  ? ?Wheelchair Mobility ?  ? ?Modified Rankin (Stroke Patients Only) ?  ? ?  ? ?Balance Overall balance assessment: Needs assistance ?Sitting-balance support: No upper extremity supported, Feet supported ?Sitting balance-Leahy Scale: Normal ?Sitting balance - Comments: steady sitting reaching  outside BOS ?  ?Standing balance support: No upper extremity supported ?Standing balance-Leahy Scale: Good ?Standing balance comment: steady standing washing hands at sink and managing underwear in standing ?  ?  ?  ?  ?  ?  ?  ?  ?  ?  ?  ?    ? ? ? ?Pertinent Vitals/Pain Pain Assessment ?Pain Assessment: No/denies pain ?Vitals (HR and O2 on room air) stable and WFL throughout treatment session.  ? ? ?Home Living Family/patient expects to be discharged to:: Private residence ?Living Arrangements: Alone ?Available Help at Discharge: Personal care attendant ?Type of Home: House ?Home Access: Level entry ?  ?  ?  ?Home Layout: One level ?Home Equipment: Shower seat;Grab bars - toilet;Grab bars - tub/shower;Rolling Walker (2 wheels);Cane - single point ?   ?  ?Prior Function Prior Level of Function : Independent/Modified Independent ?  ?  ?  ?  ?  ?  ?Mobility Comments: Independent with functional mobility. ?ADLs Comments: Has aide assist M-F for 2 hours (assist with housework).  Pt's son comes to check on pt every 2 weeks ([place]). ?  ? ? ?Hand Dominance  ?   ? ?  ?Extremity/Trunk Assessment  ? Upper Extremity Assessment ?Upper Extremity Assessment: Defer to OT evaluation ?  ? ?Lower Extremity Assessment ?Lower Extremity Assessment: Generalized weakness ?  ? ?Cervical / Trunk Assessment ?Cervical / Trunk Assessment: Normal  ?Communication  ? Communication: No difficulties  ?Cognition Arousal/Alertness: Awake/alert ?Behavior During Therapy: [facility] for tasks assessed/performed ?Overall Cognitive Status: Within Functional Limits for tasks assessed ?  ?  ?  ?  ?  ?  ?  ?  ?  ?  ?  ?  ?  ?  ?  ?  ?  ?  ?  ? ?  ?General Comments  Nursing cleared pt for participation in physical therapy.  Pt agreeable to PT session--pt requesting to toilet during session. ? ?  ?Exercises  Ambulation with RW use  ? ?Assessment/Plan  ?  ?PT Assessment Patient needs continued PT services  ?PT Problem List Decreased strength;Decreased activity tolerance;Decreased balance;Decreased mobility ? ?   ?  ?PT Treatment Interventions DME instruction;Gait training;Functional mobility training;Therapeutic activities;Therapeutic exercise;Balance training;Patient/family education   ? ?PT  Goals (Current goals can be found in the Care Plan section)  ?Acute Rehab PT Goals ?Patient Stated Goal: to improve mobility ?PT Goal Formulation: With patient ?Time For Goal Achievement: 11/12/21 ?Potential to Achieve Goals: Good ? ?  ?Frequency Min 2X/week ?  ? ? ?Co-evaluation   ?  ?  ?  ?  ? ? ?  ?AM-PAC PT "6 Clicks" Mobility  ?Outcome Measure Help needed turning from your back to your side while in a flat bed without using bedrails?: None ?Help needed moving from lying on your back to sitting on the side of a flat bed without using bedrails?: None ?Help needed moving to and from a bed to a chair (including a wheelchair)?: A Little ?Help needed standing up from a chair using your arms (e.g., wheelchair or bedside chair)?: A Little ?Help needed to walk in hospital room?: A Little ?Help needed climbing 3-5 steps with a railing? : A Little ?6 Click Score: 20 ? ?  ?End of Session Equipment Utilized During Treatment: Gait belt ?Activity Tolerance: Patient tolerated treatment well ?Patient left: in chair;with call bell/phone within reach;with chair alarm set ?Nurse Communication: Mobility status;Precautions;Other (comment) (pt left on room air at 93-94% O2 sats) ?PT  Visit Diagnosis: Other abnormalities of gait and mobility (R26.89);Muscle weakness (generalized) (M62.81) ?  ? ?Time: 9324-1991 ?PT Time Calculation (min) (ACUTE ONLY): 33 min ? ? ?Charges:   PT Evaluation ?$PT Eval Low Complexity: 1 Low ?PT Treatments ?$Gait Training: 8-22 mins ?  ?   ? ?Leitha Bleak, PT ?10/29/21, 9:37 AM ? ? ?

## 2021-10-29 NOTE — Discharge Summary (Signed)
?Physician Discharge Summary ?  ?Patient: Dawn Mcintyre MRN: [id number] DOB: May 18, 1933  ?Admit date:     10/27/2021  ?Discharge date: 10/29/21  ?Discharge Physician: Dawn Mcintyre  ? ?PCP: Dawn Crouch, MD  ? ?Recommendations at discharge:  ? ? Follow up with PCP in 1-2 weeks ? ?Discharge Diagnoses: ?Principal Problem: ?  Enteritis of infectious origin ?Active Problems: ?  Sepsis ([place]) ?  Essential hypertension ?  Ileus due to infection [facility]) ?  Chronic kidney disease, stage 3a ([place]) ?  Type 2 diabetes mellitus with stage 3a chronic kidney disease, without long-term current use of insulin ([place]) ?  GERD without esophagitis ?  Temporal arteritis ([place]) ?  UTI (urinary tract infection) ?  Hypokalemia ?  Hypomagnesemia ?  Pneumonia ? ?Resolved Problems: ?  * No resolved hospital problems. * ? ?Hospital Course: ?88 year old female with past medical history of temporal arteritis on chronic prednisone therapy, gastroesophageal reflux disease, non-insulin-dependent diabetes mellitus type 2, secondary hyperparathyroidism, anemia of chronic disease, hypertension, hyperlipidemia, chronic kidney disease stage IIIa presenting to [facility] emergency department with complaints of nausea vomiting and abdominal pain. In the ED, pt was noted to have fever of 101 ?F with associated tachycardia and leukocytosis of 11.3. ? ?Assessment and Plan: ?* Enteritis of infectious origin ?Patient complaining over a 1 month history of recurrent bouts of nausea vomiting and abdominal pain. ?Diffuse abdominal tenderness on exam with soft abdomen ?Patient exhibiting multiple SIRS criteria including leukocytosis ?CT imaging of the abdomen pelvis revealing minimally distended loops of proximal jejunum, with wall thickening and scattered gas fluid levels concerning for enteritis and ileus ?Pt continued on meropenem, to complete course of azithromycin and omnicef on d/c ?Seen by GI with recs for cont abx for pna and UTI  ?Pt clinically improved and  tolerated advancing diet to soft ? ? ?Sepsis ([place]) ?Patient exhibiting multiple SIRS criteria with evidence of infectious enteritis all concerning for sepsis. ?Urine cx pos for ecoli. Clinically improving on abx ? ?Ileus due to infection [facility]) ?Please see assessment and plan above ? ?Essential hypertension ?Continue patients home regimen of oral antihypertensives ?BP stable at this time ? ? ? ?Chronic kidney disease, stage 3a ([place]) ?Strict intake and output monitoring ?Creatinine near baseline ? ? ?Type 2 diabetes mellitus with stage 3a chronic kidney disease, without long-term current use of insulin ([place]) ?Patient been placed on Accu-Cheks before every meal and nightly with sliding scale insulin ?Patient is diet controlled in the outpatient setting ?Hemoglobin A1C of 6.0 ? ? ?GERD without esophagitis ?Continuing home regimen of daily PPI therapy. ? ? ?Temporal arteritis ([place]) ?Continue outpatient regimen of daily low-dose prednisone. ?Denies headaches ? ?Pneumonia ?-Currently on 2LNC. Pt is o2 naive ?-abd xray with findings suggestive of PNA at the L base ?-Continued above abx. Weaned to room air ? ?Hypomagnesemia ?-replaced ? ?Hypokalemia ?-K of 3.2 today ?-Replaced ? ?UTI (urinary tract infection) ?-presents with dysuria with UA suggestive of UTI ?-Follow up on pan cultures ?-Initially on meropenem. Transitioned to azithromycin and omnicef on d/c per above ? ? ? ? ?  ? ? ?Consultants: GI ?Procedures performed:   ?Disposition: Home ?Diet recommendation:  ?Carb modified diet ?DISCHARGE MEDICATION: ?Allergies as of 10/29/2021   ? ?   Reactions  ? Codeine Hives  ? Hydrocodone Nausea Only  ? Metronidazole Nausea Only  ? Propoxyphene Nausea Only  ? Sulfa Antibiotics Hives  ? Amoxicillin-pot Clavulanate Diarrhea  ? Latex Rash  ? ?  ? ?  ?Medication List  ?  ? ?  STOP taking these medications   ? ?losartan 100 MG tablet ?Commonly known as: COZAAR ?  ? ?  ? ?TAKE these medications   ? ?acetaminophen 500 MG tablet ?Commonly  known as: TYLENOL ?Take 500 mg by mouth daily as needed for headache. ?  ?ALPRAZolam 0.25 MG tablet ?Commonly known as: Duanne Moron ?Take 0.25 mg by mouth at bedtime as needed for sleep. ?  ?ascorbic acid 1000 MG tablet ?Commonly known as: VITAMIN C ?Take 1,000 mg by mouth daily. ?  ?aspirin 81 MG tablet ?Take 81 mg by mouth daily. ?  ?azithromycin 500 MG tablet ?Commonly known as: ZITHROMAX ?Take 1 tablet (500 mg total) by mouth daily for 5 days. ?Start taking on: October 30, 2021 ?  ?cefdinir 300 MG capsule ?Commonly known as: OMNICEF ?Take 1 capsule (300 mg total) by mouth 2 (two) times daily for 5 days. ?  ?cloNIDine 0.1 MG tablet ?Commonly known as: CATAPRES ?Take 0.1 mg by mouth at bedtime. ?  ?felodipine 2.5 MG 24 hr tablet ?Commonly known as: PLENDIL ?Take 2.5 mg by mouth daily. May take a second 2.5 mg dose as needed if bp is over 150/90 ?  ?fexofenadine 180 MG tablet ?Commonly known as: ALLEGRA ?Take 180 mg by mouth daily as needed for allergies. ?  ?gabapentin 100 MG capsule ?Commonly known as: NEURONTIN ?Take 100 mg by mouth 3 (three) times daily. ?  ?hydrochlorothiazide 12.5 MG tablet ?Commonly known as: HYDRODIURIL ?Take 1 tablet by mouth daily. ?  ?isosorbide mononitrate 60 MG 24 hr tablet ?Commonly known as: IMDUR ?Take 60 mg by mouth daily. ?  ?metoprolol tartrate 100 MG tablet ?Commonly known as: LOPRESSOR ?Take 100 mg by mouth 2 (two) times daily. ?  ?metroNIDAZOLE 0.75 % vaginal gel ?Commonly known as: METROGEL ?Place vaginally at bedtime. ?  ?Myrbetriq 25 MG Tb24 tablet ?Generic drug: mirabegron ER ?Take 25 mg by mouth daily. ?  ?ondansetron 4 MG tablet ?Commonly known as: ZOFRAN ?Take 4 mg by mouth every 8 (eight) hours as needed. ?  ?pantoprazole 40 MG tablet ?Commonly known as: Protonix ?Take 1 tablet (40 mg total) by mouth 2 (two) times daily before a meal. ?  ?predniSONE 2.5 MG tablet ?Commonly known as: DELTASONE ?Take 2.5 mg by mouth daily. ?  ?Premarin vaginal cream ?Generic drug: conjugated  estrogens ?Apply one pea-sized amount around the opening of the urethra three times weekly. ?  ?Vitamin D3 1.25 MG (50000 UT) Caps ?Take 1 capsule by mouth daily. ?  ?vitamin E 180 MG (400 UNITS) capsule ?Take 400 Units by mouth daily. ?  ? ?  ? ? Follow-up Information   ? ? Dawn Crouch, MD Follow up in 2 week(s).   ?Specialty: Internal Medicine ?Why: Hospital follow up ?Contact information: ?[place] [address] ?[phone] ? ? ?  ?  ? ?  ?  ? ?  ? ?Discharge Exam: ?Danley Danker Weights  ? 10/27/21 1914  ?Weight: 62.1 kg  ? ?General exam: Awake, laying in bed, in nad ?Respiratory system: Normal respiratory effort, no wheezing ?Cardiovascular system: regular rate, s1, s2 ?Gastrointestinal system: Soft, nondistended, positive BS ?Central nervous system: CN2-12 grossly intact, strength intact ?Extremities: Perfused, no clubbing ?Skin: Normal skin turgor, no notable skin lesions seen ?Psychiatry: Mood normal // no visual hallucinations  ? ?Condition at discharge: good ? ?The results of significant diagnostics from this hospitalization (including imaging, microbiology, ancillary and laboratory) are listed below for reference.  ? ?Imaging Studies: ?CT ABDOMEN  PELVIS W CONTRAST ? ?Result Date: 10/27/2021 ?CLINICAL DATA:  Nausea and vomiting since 2 p.m., generalized abdominal pain, urinary frequency, dysuria EXAM: CT ABDOMEN AND PELVIS WITH CONTRAST TECHNIQUE: Multidetector CT imaging of the abdomen and pelvis was performed using the standard protocol following bolus administration of intravenous contrast. RADIATION DOSE REDUCTION: This exam was performed according to the departmental dose-optimization program which includes automated exposure control, adjustment of the mA and/or kV according to patient size and/or use of iterative reconstruction technique. CONTRAST:  42m OMNIPAQUE IOHEXOL 300 MG/ML  SOLN COMPARISON:  12/19/2020 FINDINGS: Lower chest: Dependent hypoventilatory  changes within the lower lobes. No acute pleural or parenchymal lung disease. Hepatobiliary: No focal liver abnormality is seen. No gallstones, gallbladder wall thickening, or biliary dilatation. Pancreas: [place]

## 2021-10-29 NOTE — TOC Progression Note (Signed)
Transition of Care (TOC) - Progression Note  ? ? ?Patient Details  ?Name: Dawn Mcintyre ?MRN: [id number] ?Date of Birth: 01-28-1933 ? ?Transition of Care (TOC) CM/SW Contact  ?Makiyah Zentz A Nia Nathaniel, LCSW ?Phone Number: ?10/29/2021, 5:02 PM ? ?Clinical Narrative:    ?CSW unable to find a [place] agency in network with pt's insurance or has the staff to service pt. ? ?[facility]- out of network ?[facility]- will not take ?[facility]- does not have the staff ?[facility]- can not staff ?[facility]- can not accept ?[facility]- can not accept. ? ?Outpatient referral sent to [facility]. MD aware. ? ? ?  ?  ? ?Expected Discharge Plan and Services ?  ?  ?  ?  ?  ?Expected Discharge Date: 10/29/21               ?  ?  ?  ?  ?  ?  ?  ?  ?  ?  ? ? ?Social Determinants of Health (SDOH) Interventions ?  ? ?Readmission Risk Interventions ?   ? View : No data to display.  ?  ?  ?  ? ? ?

## 2021-10-30 LAB — URINE CULTURE: Culture: >=100000 — AB

## 2021-11-01 LAB — CULTURE, BLOOD (ROUTINE X 2)
Culture: NO GROWTH
Culture: NO GROWTH

## 2021-12-04 DIAGNOSIS — Z8739 Personal history of other diseases of the musculoskeletal system and connective tissue: Secondary | ICD-10-CM | POA: Insufficient documentation

## 2021-12-10 ENCOUNTER — Inpatient Hospital Stay: Payer: Medicare Other | Attending: Internal Medicine

## 2021-12-10 DIAGNOSIS — D472 Monoclonal gammopathy: Secondary | ICD-10-CM | POA: Diagnosis present

## 2021-12-10 DIAGNOSIS — D631 Anemia in chronic kidney disease: Secondary | ICD-10-CM | POA: Insufficient documentation

## 2021-12-10 DIAGNOSIS — N183 Chronic kidney disease, stage 3 unspecified: Secondary | ICD-10-CM | POA: Diagnosis not present

## 2021-12-10 DIAGNOSIS — Z79899 Other long term (current) drug therapy: Secondary | ICD-10-CM | POA: Insufficient documentation

## 2021-12-10 LAB — IRON AND TIBC
Iron: 68 ug/dL (ref 28–170)
Saturation Ratios: 19 % (ref 10.4–31.8)
TIBC: 354 ug/dL (ref 250–450)
UIBC: 286 ug/dL

## 2021-12-10 LAB — COMPREHENSIVE METABOLIC PANEL
ALT: 10 U/L (ref 0–44)
AST: 15 U/L (ref 15–41)
Albumin: 3.6 g/dL (ref 3.5–5.0)
Alkaline Phosphatase: 71 U/L (ref 38–126)
Anion gap: 5 (ref 5–15)
BUN: 18 mg/dL (ref 8–23)
CO2: 28 mmol/L (ref 22–32)
Calcium: 9.3 mg/dL (ref 8.9–10.3)
Chloride: 103 mmol/L (ref 98–111)
Creatinine, Ser: 0.94 mg/dL (ref 0.44–1.00)
GFR, Estimated: 59 mL/min — ABNORMAL LOW (ref >60)
Glucose, Bld: 142 mg/dL — ABNORMAL HIGH (ref 70–99)
Potassium: 4 mmol/L (ref 3.5–5.1)
Sodium: 136 mmol/L (ref 135–145)
Total Bilirubin: 0.4 mg/dL (ref 0.3–1.2)
Total Protein: 6.7 g/dL (ref 6.5–8.1)

## 2021-12-10 LAB — CBC WITH DIFFERENTIAL/PLATELET
Abs Immature Granulocytes: 0.04 10*3/uL (ref 0.00–0.07)
Basophils Absolute: 0 10*3/uL (ref 0.0–0.1)
Basophils Relative: 1 %
Eosinophils Absolute: 0.1 10*3/uL (ref 0.0–0.5)
Eosinophils Relative: 1 %
HCT: 35.2 % — ABNORMAL LOW (ref 36.0–46.0)
Hemoglobin: 11.4 g/dL — ABNORMAL LOW (ref 12.0–15.0)
Immature Granulocytes: 1 %
Lymphocytes Relative: 19 %
Lymphs Abs: 1.5 10*3/uL (ref 0.7–4.0)
MCH: 30 pg (ref 26.0–34.0)
MCHC: 32.4 g/dL (ref 30.0–36.0)
MCV: 92.6 fL (ref 80.0–100.0)
Monocytes Absolute: 0.6 10*3/uL (ref 0.1–1.0)
Monocytes Relative: 7 %
Neutro Abs: 5.9 10*3/uL (ref 1.7–7.7)
Neutrophils Relative %: 71 %
Platelets: 245 10*3/uL (ref 150–400)
RBC: 3.8 MIL/uL — ABNORMAL LOW (ref 3.87–5.11)
RDW: 13.4 % (ref 11.5–15.5)
WBC: 8.2 10*3/uL (ref 4.0–10.5)
nRBC: 0 % (ref 0.0–0.2)

## 2021-12-10 LAB — FERRITIN: Ferritin: 23 ng/mL (ref 11–307)

## 2021-12-11 LAB — KAPPA/LAMBDA LIGHT CHAINS
Kappa free light chain: 15.6 mg/L (ref 3.3–19.4)
Kappa, lambda light chain ratio: 1.54 (ref 0.26–1.65)
Lambda free light chains: 10.1 mg/L (ref 5.7–26.3)

## 2021-12-14 LAB — MULTIPLE MYELOMA PANEL, SERUM
Albumin SerPl Elph-Mcnc: 3.4 g/dL (ref 2.9–4.4)
Albumin/Glob SerPl: 1.4 (ref 0.7–1.7)
Alpha 1: 0.3 g/dL (ref 0.0–0.4)
Alpha2 Glob SerPl Elph-Mcnc: 0.7 g/dL (ref 0.4–1.0)
B-Globulin SerPl Elph-Mcnc: 0.9 g/dL (ref 0.7–1.3)
Gamma Glob SerPl Elph-Mcnc: 0.6 g/dL (ref 0.4–1.8)
Globulin, Total: 2.5 g/dL (ref 2.2–3.9)
IgA: 135 mg/dL (ref 64–422)
IgG (Immunoglobin G), Serum: 635 mg/dL (ref 586–1602)
IgM (Immunoglobulin M), Srm: 26 mg/dL (ref 26–217)
M Protein SerPl Elph-Mcnc: 0.1 g/dL — ABNORMAL HIGH
Total Protein ELP: 5.9 g/dL — ABNORMAL LOW (ref 6.0–8.5)

## 2021-12-17 ENCOUNTER — Encounter: Payer: Self-pay | Admitting: Internal Medicine

## 2021-12-17 ENCOUNTER — Inpatient Hospital Stay (HOSPITAL_BASED_OUTPATIENT_CLINIC_OR_DEPARTMENT_OTHER): Payer: Medicare Other | Admitting: Internal Medicine

## 2021-12-17 DIAGNOSIS — D472 Monoclonal gammopathy: Secondary | ICD-10-CM

## 2021-12-17 NOTE — Assessment & Plan Note (Addendum)
#  October 2022 [neurology] IgG lambda 0.2 g/dL; kappa lambda light chain ratio normal.  Chronic kidney disease GFR 50-60; mild anemia hemoglobin 11; normal calcium.  No worsening bone pain.  Clinical suggestive of MGUS.  ? ?# MGUS-long discussion with the patient regarding natural history of MGUS; small risk of progression to multiple myeloma. Patient is less likely at this time patient has any active myeloma-although given renal insufficiency/anemia [see discussion below]. For now will keep checking 6 months; and then yearly.  ? ?#Anemia-secondary to CKD-mild around 11; recommend gentle iron- once  Day.  ? ?#Chronic kidney disease-stage-stage III [Dr.K]; followed by nephrology.   ? ?# DISPOSITION: ?# NO labs today ?# Follow up in 6  months- 1 week Prior- cbc/cmp/MM panel; K/l light chains; iron studies/ferritin-Dr.B ?

## 2021-12-17 NOTE — Patient Instructions (Signed)
#  Recommend gentle iron 1 pill a day; should not upset your stomach or cause constipation.  Talk to the pharmacist if you can find it/it is over-the-counter.  

## 2021-12-17 NOTE — Progress Notes (Signed)
[facility] ?CONSULT NOTE ? ?Patient Care Team: ?Idelle Crouch, MD as PCP - General (Internal Medicine) ?Cammie Sickle, MD as Consulting Physician (Hematology) ?Vladimir Crofts, MD as Consulting Physician (Neurology) ? ?CHIEF COMPLAINTS/PURPOSE OF CONSULTATION: Monoclonal gammopathy ? ?HEMATOLOGY HISTORY ? ?# OCT 2022 [Dr.Shah; Neuropathy-]Immunofixation shows IgG monoclonal protein with lambda light chain specificity; K/L=WNL; MAY 2023- 0.'1mg'$ /dl; K/l= N.  ? ?# CKD stage- IIIA- [Dr.Korrpati]; # Temporal arteritis: Continue the small dose of prednisone daily; OA ? ?HISTORY OF PRESENTING ILLNESS: Alone.  Ambulating independently. ? ?Dawn Mcintyre 88 y.o.  female is here for a follow up for work-up for monoclonal gammopathy. ? ?Patient denies any new symptoms.  Denies any worsening joint pains or bone pain.  Chronic osteoarthritis. ? ?Review of Systems  ?Constitutional:  Negative for chills, diaphoresis, fever, malaise/fatigue and weight loss.  ?HENT:  Negative for nosebleeds and sore throat.   ?Eyes:  Negative for double vision.  ?Respiratory:  Negative for cough, hemoptysis, sputum production, shortness of breath and wheezing.   ?Cardiovascular:  Negative for chest pain, palpitations, orthopnea and leg swelling.  ?Gastrointestinal:  Negative for abdominal pain, blood in stool, constipation, diarrhea, heartburn, melena, nausea and vomiting.  ?Genitourinary:  Negative for dysuria, frequency and urgency.  ?Musculoskeletal:  Negative for back pain and joint pain.  ?Skin: Negative.  Negative for itching and rash.  ?Neurological:  Positive for tingling. Negative for dizziness, focal weakness, weakness and headaches.  ?Endo/Heme/Allergies:  Does not bruise/bleed easily.  ?Psychiatric/Behavioral:  Negative for depression. The patient is not nervous/anxious and does not have insomnia.   ? ?MEDICAL HISTORY:  ?Past Medical History:  ?Diagnosis Date  ? Arteritis ([place])   ? Arthritis   ? Atrophic  vaginitis 11/16/2014  ? Carotid artery stenosis   ? Cataract   ? Difficult intubation   ? Environmental and seasonal allergies   ? GERD (gastroesophageal reflux disease)   ? Hyperlipidemia   ? Hypertension   ? Lack of bladder control   ? Neuropathy   ? Osteoporosis   ? Pneumonia   ?  x 105yr ago  ? Pre-diabetes   ? Shingles   ? Ulcer   ? ? ?SURGICAL HISTORY: ?Past Surgical History:  ?Procedure Laterality Date  ? ABDOMINAL HYSTERECTOMY  1972  ? BREAST CYST EXCISION Right 1972  ? BREAST CYST EXCISION Left 1972  ? ENDARTERECTOMY Left 12/24/2017  ? Procedure: ENDARTERECTOMY CAROTID;  Surgeon: SKatha Cabal MD;  Location: [facility] ORS;  Service: Vascular;  Laterality: Left;  ? ENDARTERECTOMY Right 03/13/2018  ? Procedure: ENDARTERECTOMY CAROTID;  Surgeon: SKatha Cabal MD;  Location: [facility] OR[facility];  Service: Vascular;  Laterality: Right;  ? ESOPHAGOGASTRODUODENOSCOPY (EGD) WITH PROPOFOL N/A 11/28/2020  ? Procedure: ESOPHAGOGASTRODUODENOSCOPY (EGD) WITH PROPOFOL;  Surgeon: WLucilla Lame MD;  [facility];  Service: Endoscopy;  Laterality: N/A;  ? EYE SURGERY    ? THROAT SURGERY    ? x 10 yrs ago. throat mass.  ? TONSILLECTOMY    ? ? ?SOCIAL HISTORY: ?Social History  ? ?Socioeconomic History  ? Marital status: Widowed  ?  Spouse name: Not on file  ? Number of children: Not on file  ? Years of education: Not on file  ? Highest education level: Not on file  ?Occupational History  ? Not on file  ?Tobacco Use  ? Smoking status: Never  ?  Passive exposure: Never  ? Smokeless tobacco: Never  ?Vaping Use  ? Vaping Use: Never used  ?Substance and  Sexual Activity  ? Alcohol use: No  ?  Alcohol/week: 0.0 standard drinks  ? Drug use: No  ? Sexual activity: Not Currently  ?Other Topics Concern  ? Not on file  ?Social History Narrative  ? Not on file  ? ?Social Determinants of Health  ? ?Financial Resource Strain: Not on file  ?Food Insecurity: Not on file  ?Transportation Needs: Not on file  ?Physical Activity: Not on file   ?Stress: Not on file  ?Social Connections: Not on file  ?Intimate Partner Violence: Not on file  ? ? ?FAMILY HISTORY: ?Family History  ?Problem Relation Age of Onset  ? Anuerysm Mother   ? Prostate cancer Father   ? Esophageal cancer Sister   ? Lung cancer Sister   ? Diabetes Brother   ? Hearing loss Brother   ? Bladder Cancer Neg Hx   ? Kidney cancer Neg Hx   ? Breast cancer Neg Hx   ? ? ?ALLERGIES:  is allergic to codeine, hydrocodone, metronidazole, propoxyphene, sulfa antibiotics, amoxicillin-pot clavulanate, and latex. ? ?MEDICATIONS:  ?Current Outpatient Medications  ?Medication Sig Dispense Refill  ? acetaminophen (TYLENOL) 500 MG tablet Take 500 mg by mouth daily as needed for headache.    ? ALPRAZolam (XANAX) 0.25 MG tablet Take 0.25 mg by mouth at bedtime as needed for sleep.    ? ascorbic acid (VITAMIN C) 1000 MG tablet Take 1,000 mg by mouth daily.    ? aspirin 81 MG tablet Take 81 mg by mouth daily.    ? Cholecalciferol (VITAMIN D3) 1.25 MG (50000 UT) CAPS Take 1 capsule by mouth daily.    ? cloNIDine (CATAPRES) 0.1 MG tablet Take 0.1 mg by mouth at bedtime.    ? conjugated estrogens (PREMARIN) vaginal cream Apply one pea-sized amount around the opening of the urethra three times weekly. 30 g 3  ? felodipine (PLENDIL) 2.5 MG 24 hr tablet Take 2.5 mg by mouth daily. May take a second 2.5 mg dose as needed if bp is over 150/90    ? fexofenadine (ALLEGRA) 180 MG tablet Take 180 mg by mouth daily as needed for allergies.     ? gabapentin (NEURONTIN) 100 MG capsule Take 100 mg by mouth 3 (three) times daily.    ? isosorbide mononitrate (IMDUR) 60 MG 24 hr tablet Take 60 mg by mouth daily.    ? metoprolol tartrate (LOPRESSOR) 100 MG tablet Take 100 mg by mouth 2 (two) times daily.    ? metroNIDAZOLE (METROGEL) 0.75 % vaginal gel Place vaginally at bedtime.    ? mirabegron ER (MYRBETRIQ) 25 MG TB24 tablet Take 25 mg by mouth daily.    ? ondansetron (ZOFRAN) 4 MG tablet Take 4 mg by mouth every 8 (eight) hours  as needed.    ? predniSONE (DELTASONE) 2.5 MG tablet Take 2.5 mg by mouth daily.    ? vitamin E 180 MG (400 UNITS) capsule Take 400 Units by mouth daily.    ? hydrochlorothiazide (HYDRODIURIL) 12.5 MG tablet Take 1 tablet by mouth daily. (Patient not taking: Reported on 10/27/2021)    ? pantoprazole (PROTONIX) 40 MG tablet Take 1 tablet (40 mg total) by mouth 2 (two) times daily before a meal. 60 tablet 0  ? ?No current facility-administered medications for this visit.  ? ? ? ? ?PHYSICAL EXAMINATION: ? ? ?Vitals:  ? 12/17/21 1358  ?BP: 122/61  ?Pulse: (!) 59  ?Temp: (!) 97.3 ?F (36.3 ?C)  ?SpO2: 99%  ? ?Filed Weights  ?  12/17/21 1358  ?Weight: 133 lb 3.2 oz (60.4 kg)  ? ? ?Physical Exam ?Vitals and nursing note reviewed.  ?HENT:  ?   Head: Normocephalic and atraumatic.  ?   Mouth/Throat:  ?   Pharynx: Oropharynx is clear.  ?Eyes:  ?   Extraocular Movements: Extraocular movements intact.  ?   Pupils: Pupils are equal, round, and reactive to light.  ?Cardiovascular:  ?   Rate and Rhythm: Normal rate and regular rhythm.  ?Pulmonary:  ?   Comments: Decreased breath sounds bilaterally.  ?Abdominal:  ?   Palpations: Abdomen is soft.  ?Musculoskeletal:     ?   General: Normal range of motion.  ?   Cervical back: Normal range of motion.  ?Skin: ?   General: Skin is warm.  ?Neurological:  ?   General: No focal deficit present.  ?   Mental Status: She is alert and oriented to person, place, and time.  ?Psychiatric:     ?   Behavior: Behavior normal.     ?   Judgment: Judgment normal.  ? ? ?LABORATORY DATA:  ?I have reviewed the data as listed ?Lab Results  ?Component Value Date  ? WBC 8.2 12/10/2021  ? HGB 11.4 (L) 12/10/2021  ? HCT 35.2 (L) 12/10/2021  ? MCV 92.6 12/10/2021  ? PLT 245 12/10/2021  ? ?Recent Labs  ?  10/28/21 ?0415 10/29/21 ?1610 12/10/21 ?1430  ?NA 139 140 136  ?K 3.2* 4.2 4.0  ?CL 107 108 103  ?CO2 '24 27 28  '$ ?GLUCOSE 112* 85 142*  ?BUN '18 14 18  '$ ?CREATININE 0.85 0.73 0.94  ?CALCIUM 8.5* 9.2 9.3  ?GFRNONAA >60  >60 59*  ?PROT 5.3* 5.1* 6.7  ?ALBUMIN 3.0* 2.8* 3.6  ?AST '21 16 15  '$ ?ALT '11 10 10  '$ ?ALKPHOS 44 42 71  ?BILITOT 0.8 0.7 0.4  ? ? ? ?No results found. ? ?Lab Results  ?Component Value Date  ? KPAFRELGTCHN 15

## 2021-12-17 NOTE — Progress Notes (Signed)
C/o neck being sore. ? ?Lab results from last week. ?

## 2021-12-19 ENCOUNTER — Emergency Department
Admission: EM | Admit: 2021-12-19 | Discharge: 2021-12-19 | Disposition: A | Discharge status: Home / Self Care | Payer: Medicare Other | Attending: Student in an Organized Health Care Education/Training Program | Admitting: Student in an Organized Health Care Education/Training Program

## 2021-12-19 ENCOUNTER — Other Ambulatory Visit: Payer: Self-pay

## 2021-12-19 DIAGNOSIS — R55 Syncope and collapse: Secondary | ICD-10-CM | POA: Diagnosis not present

## 2021-12-19 DIAGNOSIS — R42 Dizziness and giddiness: Secondary | ICD-10-CM | POA: Diagnosis present

## 2021-12-19 DIAGNOSIS — R410 Disorientation, unspecified: Secondary | ICD-10-CM | POA: Insufficient documentation

## 2021-12-19 DIAGNOSIS — Z5321 Procedure and treatment not carried out due to patient leaving prior to being seen by health care provider: Secondary | ICD-10-CM | POA: Insufficient documentation

## 2021-12-19 LAB — URINALYSIS, ROUTINE W REFLEX MICROSCOPIC
Bacteria, UA: NONE SEEN
Bilirubin Urine: NEGATIVE
Glucose, UA: NEGATIVE mg/dL
Hgb urine dipstick: NEGATIVE
Ketones, ur: NEGATIVE mg/dL
Nitrite: NEGATIVE
Protein, ur: 30 mg/dL — AB
Specific Gravity, Urine: 1.019 (ref 1.005–1.030)
pH: 5 (ref 5.0–8.0)

## 2021-12-19 LAB — CBC
HCT: 36.4 % (ref 36.0–46.0)
Hemoglobin: 11.4 g/dL — ABNORMAL LOW (ref 12.0–15.0)
MCH: 28.7 pg (ref 26.0–34.0)
MCHC: 31.3 g/dL (ref 30.0–36.0)
MCV: 91.7 fL (ref 80.0–100.0)
Platelets: 218 10*3/uL (ref 150–400)
RBC: 3.97 MIL/uL (ref 3.87–5.11)
RDW: 13.2 % (ref 11.5–15.5)
WBC: 8.8 10*3/uL (ref 4.0–10.5)
nRBC: 0 % (ref 0.0–0.2)

## 2021-12-19 LAB — BASIC METABOLIC PANEL
Anion gap: 9 (ref 5–15)
BUN: 18 mg/dL (ref 8–23)
CO2: 28 mmol/L (ref 22–32)
Calcium: 10.2 mg/dL (ref 8.9–10.3)
Chloride: 102 mmol/L (ref 98–111)
Creatinine, Ser: 0.94 mg/dL (ref 0.44–1.00)
GFR, Estimated: 59 mL/min — ABNORMAL LOW (ref >60)
Glucose, Bld: 166 mg/dL — ABNORMAL HIGH (ref 70–99)
Potassium: 3.4 mmol/L — ABNORMAL LOW (ref 3.5–5.1)
Sodium: 139 mmol/L (ref 135–145)

## 2021-12-19 NOTE — ED Triage Notes (Signed)
Pt c/o while eating brunch today around 12pm she became dizzy,light headed and confused for a few minutes and almost passed out. Pt is a/ox4 at present. Denies any pain ?

## 2021-12-29 ENCOUNTER — Other Ambulatory Visit: Payer: Self-pay

## 2021-12-29 ENCOUNTER — Encounter: Payer: Self-pay | Admitting: Emergency Medicine

## 2021-12-29 ENCOUNTER — Emergency Department
Admission: EM | Admit: 2021-12-29 | Discharge: 2021-12-29 | Disposition: A | Discharge status: Home / Self Care | Payer: Medicare Other | Attending: Emergency Medicine | Admitting: Emergency Medicine

## 2021-12-29 DIAGNOSIS — I10 Essential (primary) hypertension: Secondary | ICD-10-CM | POA: Insufficient documentation

## 2021-12-29 LAB — CBC
HCT: 36.8 % (ref 36.0–46.0)
Hemoglobin: 11.6 g/dL — ABNORMAL LOW (ref 12.0–15.0)
MCH: 29.4 pg (ref 26.0–34.0)
MCHC: 31.5 g/dL (ref 30.0–36.0)
MCV: 93.2 fL (ref 80.0–100.0)
Platelets: 198 10*3/uL (ref 150–400)
RBC: 3.95 MIL/uL (ref 3.87–5.11)
RDW: 13.2 % (ref 11.5–15.5)
WBC: 8.1 10*3/uL (ref 4.0–10.5)
nRBC: 0 % (ref 0.0–0.2)

## 2021-12-29 LAB — BASIC METABOLIC PANEL
Anion gap: 7 (ref 5–15)
BUN: 15 mg/dL (ref 8–23)
CO2: 29 mmol/L (ref 22–32)
Calcium: 9.9 mg/dL (ref 8.9–10.3)
Chloride: 102 mmol/L (ref 98–111)
Creatinine, Ser: 0.87 mg/dL (ref 0.44–1.00)
GFR, Estimated: >60 mL/min (ref >60)
Glucose, Bld: 136 mg/dL — ABNORMAL HIGH (ref 70–99)
Potassium: 4 mmol/L (ref 3.5–5.1)
Sodium: 138 mmol/L (ref 135–145)

## 2021-12-29 NOTE — ED Provider Notes (Signed)
[facility] Provider Note    Event Date/Time   First MD Initiated Contact with Patient 12/29/21 1407     (approximate)  History   Chief Complaint: Hypertension  HPI  Dawn Mcintyre is a 88 y.o. female with a past medical history of gastric reflux hypertension hyperlipidemia presents the emergency department for high blood pressure.  According to the patient earlier today at home she checked her blood pressure and it was 194 systolic.  Patient became concerned so she came to the emergency department for evaluation.  Patient states she took 0.1 mg of clonidine after she took her high blood pressure.  Upon arrival to the emergency department patient's blood pressure was 138/63.  Patient has no other complaints in the emergency department.  Denies any chest pain shortness of breath.  Denies weakness or numbness.  Otherwise appears well.  Patient is asking about what she can do with her blood pressure to avoid spikes in blood pressure.  Physical Exam   Triage Vital Signs: ED Triage Vitals  Enc Vitals Group     BP 12/29/21 1234 138/63     Pulse Rate 12/29/21 1234 (!) 57     Resp 12/29/21 1234 18     Temp 12/29/21 1234 98.6 F (37 C)     Temp Source 12/29/21 1234 Oral     SpO2 12/29/21 1234 98 %     Weight 12/29/21 1214 132 lb (59.9 kg)     Height 12/29/21 1214 '5\' 7"'$  (1.702 m)     Head Circumference --      Peak Flow --      Pain Score 12/29/21 1214 5     Pain Loc --      Pain Edu? --      Excl. in [place]? --     Most recent vital signs: Vitals:   12/29/21 1234  BP: 138/63  Pulse: (!) 57  Resp: 18  Temp: 98.6 F (37 C)  SpO2: 98%    General: Awake, no distress.  CV:  Good peripheral perfusion.  Regular rate and rhythm  Resp:  Normal effort.  Equal breath sounds bilaterally.  Abd:  No distention.  Soft, nontender.  No rebound or guarding.    ED Results / Procedures / Treatments   EKG  EKG viewed and interpreted by myself shows sinus  bradycardia 54 bpm with a narrow QRS, normal axis, normal intervals, no concerning ST changes.  MEDICATIONS ORDERED IN ED: Medications - No data to display   IMPRESSION / MDM / [place] / ED COURSE  I reviewed the triage vital signs and the nursing notes.  Patient then seen emergency department for elevated blood pressure at home.  Patient states she checks her blood pressure twice daily every day.  She states she checked her blood pressure earlier today and it was elevated around 190, states she checked again later around lunchtime and notes elevated to 200 patient took 0.1 mg of clonidine and came to the emergency department.  Here the patient's blood pressure is 138/63.  Vitals are reassuring.  Lab work appears reassuring as well with a normal CBC normal chemistry including normal renal function.  EKG is normal.  Overall the patient appears well.  I discussed with the patient to follow-up with her doctor on Tuesday.  I also discussed as the patient has 0.1 mg clonidine prescribed to her already she could take 1 of these medications up to twice daily if needed for blood  pressure over 842 systolic.  Patient agreeable to plan of care.  FINAL CLINICAL IMPRESSION(S) / ED DIAGNOSES   Hypertension  Note:  This document was prepared using Dragon voice recognition software and may include unintentional dictation errors.   Harvest Dark, MD 12/29/21 1453

## 2021-12-29 NOTE — ED Triage Notes (Incomplete)
Pt via POV from home. Pt c/o HTN, headache, and lightheadedness since this AM. States that she has taken her medication as prescribed. Pt is A&Ox4 and NAD

## 2022-01-07 ENCOUNTER — Emergency Department
Admission: EM | Admit: 2022-01-07 | Discharge: 2022-01-07 | Disposition: A | Discharge status: Home / Self Care | Payer: Medicare Other | Attending: Emergency Medicine | Admitting: Emergency Medicine

## 2022-01-07 ENCOUNTER — Other Ambulatory Visit: Payer: Self-pay

## 2022-01-07 ENCOUNTER — Emergency Department: Payer: Medicare Other

## 2022-01-07 DIAGNOSIS — I10 Essential (primary) hypertension: Secondary | ICD-10-CM | POA: Insufficient documentation

## 2022-01-07 LAB — CBC
HCT: 36.7 % (ref 36.0–46.0)
Hemoglobin: 11.7 g/dL — ABNORMAL LOW (ref 12.0–15.0)
MCH: 29 pg (ref 26.0–34.0)
MCHC: 31.9 g/dL (ref 30.0–36.0)
MCV: 90.8 fL (ref 80.0–100.0)
Platelets: 200 10*3/uL (ref 150–400)
RBC: 4.04 MIL/uL (ref 3.87–5.11)
RDW: 13.2 % (ref 11.5–15.5)
WBC: 7.2 10*3/uL (ref 4.0–10.5)
nRBC: 0 % (ref 0.0–0.2)

## 2022-01-07 LAB — COMPREHENSIVE METABOLIC PANEL
ALT: 10 U/L (ref 0–44)
AST: 20 U/L (ref 15–41)
Albumin: 3.7 g/dL (ref 3.5–5.0)
Alkaline Phosphatase: 69 U/L (ref 38–126)
Anion gap: 8 (ref 5–15)
BUN: 13 mg/dL (ref 8–23)
CO2: 27 mmol/L (ref 22–32)
Calcium: 10.1 mg/dL (ref 8.9–10.3)
Chloride: 103 mmol/L (ref 98–111)
Creatinine, Ser: 0.89 mg/dL (ref 0.44–1.00)
GFR, Estimated: >60 mL/min (ref >60)
Glucose, Bld: 128 mg/dL — ABNORMAL HIGH (ref 70–99)
Potassium: 3.4 mmol/L — ABNORMAL LOW (ref 3.5–5.1)
Sodium: 138 mmol/L (ref 135–145)
Total Bilirubin: 0.7 mg/dL (ref 0.3–1.2)
Total Protein: 6.6 g/dL (ref 6.5–8.1)

## 2022-01-07 LAB — TROPONIN I (HIGH SENSITIVITY): Troponin I (High Sensitivity): 12 ng/L (ref <18)

## 2022-01-07 NOTE — ED Provider Notes (Signed)
[facility] Provider Note    Event Date/Time   First MD Initiated Contact with Patient 01/07/22 1058     (approximate)   History   High blood pressure   HPI  Dawn Mcintyre is a 88 y.o. female with a history of high blood pressure, hyperlipidemia, prediabetes who presents with complaints of elevated blood pressure.  Patient reports her blood pressure is frequently elevated in the mornings, she is concerned about this because her mother had an aneurysm.  She is compliant with her medications.  She sees cardiology and internal medicine.  She has a appointment with cardiology in 2 days     Physical Exam   Triage Vital Signs: ED Triage Vitals  Enc Vitals Group     BP 01/07/22 1033 (!) 159/72     Pulse Rate 01/07/22 1033 (!) 57     Resp 01/07/22 1033 18     Temp 01/07/22 1033 98.2 F (36.8 C)     Temp Source 01/07/22 1033 Oral     SpO2 01/07/22 1033 98 %     Weight 01/07/22 1034 60.3 kg (133 lb)     Height 01/07/22 1034 1.702 m ('5\' 7"'$ )     Head Circumference --      Peak Flow --      Pain Score 01/07/22 1034 3     Pain Loc --      Pain Edu? --      Excl. in [place]? --     Most recent vital signs: Vitals:   01/07/22 1033  BP: (!) 159/72  Pulse: (!) 57  Resp: 18  Temp: 98.2 F (36.8 C)  SpO2: 98%     General: Awake, no distress.  CV:  Good peripheral perfusion.  Resp:  Normal effort.  Abd:  No distention.  Other:     ED Results / Procedures / Treatments   Labs (all labs ordered are listed, but only abnormal results are displayed) Labs Reviewed  CBC - Abnormal; Notable for the following components:      Result Value   Hemoglobin 11.7 (*)    All other components within normal limits  COMPREHENSIVE METABOLIC PANEL - Abnormal; Notable for the following components:   Potassium 3.4 (*)    Glucose, Bld 128 (*)    All other components within normal limits  TROPONIN I (HIGH SENSITIVITY)  TROPONIN I (HIGH SENSITIVITY)     EKG  ED  ECG REPORT I, Lavonia Drafts, the attending physician, personally viewed and interpreted this ECG.  Date: 01/07/2022  Rhythm: normal sinus rhythm QRS Axis: normal Intervals: normal ST/T Wave abnormalities: normal Narrative Interpretation: no evidence of acute ischemia    RADIOLOGY Chest x-ray viewed interpret by me, no acute abnormality    PROCEDURES:  Critical Care performed:   Procedures   MEDICATIONS ORDERED IN ED: Medications - No data to display   IMPRESSION / MDM / [place] / ED COURSE  I reviewed the triage vital signs and the nursing notes. Patient's presentation is most consistent with acute presentation with potential threat to life or bodily function.  Patient presents with high blood pressure as detailed above  Differential includes primary hypertension, kidney injury, heart strain  Overall patient is well appearing with reassuring exam.  EKG is unremarkable.  Chest x-ray is within normal limits  CBC BMP normal, high sensitive troponin is normal.  Patient's blood pressure here is significantly improved from what it was at home.  She has follow-up  with her cardiologist in 2 days, I have encouraged her to discuss blood pressure medications with her cardiologist        FINAL CLINICAL IMPRESSION(S) / ED DIAGNOSES   Final diagnoses:  Primary hypertension     Rx / DC Orders   ED Discharge Orders     None        Note:  This document was prepared using Dragon voice recognition software and may include unintentional dictation errors.   Lavonia Drafts, MD 01/07/22 [phone]

## 2022-01-07 NOTE — ED Triage Notes (Signed)
Pt in with co high blood pressure states last night it was 210/110 states will improve during the day but is now co dizziness.

## 2022-01-07 NOTE — ED Provider Triage Note (Addendum)
Emergency Medicine Provider Triage Evaluation Note  Dawn Mcintyre , a 88 y.o. female  was evaluated in triage.  Pt complains of elevated BP 210/110.  Does report a twinge in her chest.   Review of Systems  Positive: Elevated BP, chest tinge  Negative: Headache   Physical Exam  BP (!) 159/72 (BP Location: Left Arm)   Pulse (!) 57   Temp 98.2 F (36.8 C) (Oral)   Resp 18   Ht '5\' 7"'$  (1.702 m)   Wt 60.3 kg   SpO2 98%   BMI 20.83 kg/m  Gen:   Awake, no distress   Resp:  Normal effort  MSK:   Moves extremities without difficulty  Other:    Medical Decision Making  Medically screening exam initiated at 10:39 AM.  Appropriate orders placed.  Annie Main Anderegg was informed that the remainder of the evaluation will be completed by another provider, this initial triage assessment does not replace that evaluation, and the importance of remaining in the ED until their evaluation is complete.  Labs    Vanessa [place], MD 01/07/22 1040    Vanessa , MD 01/07/22 1041

## 2022-01-09 ENCOUNTER — Other Ambulatory Visit: Payer: Self-pay | Admitting: Internal Medicine

## 2022-01-09 DIAGNOSIS — I701 Atherosclerosis of renal artery: Secondary | ICD-10-CM

## 2022-01-17 ENCOUNTER — Ambulatory Visit
Admission: RE | Admit: 2022-01-17 | Discharge: 2022-01-17 | Disposition: A | Discharge status: Home / Self Care | Payer: Medicare Other | Source: Ambulatory Visit | Attending: Internal Medicine | Admitting: Internal Medicine

## 2022-01-17 DIAGNOSIS — I701 Atherosclerosis of renal artery: Secondary | ICD-10-CM | POA: Diagnosis present

## 2022-01-17 DIAGNOSIS — N281 Cyst of kidney, acquired: Secondary | ICD-10-CM | POA: Diagnosis not present

## 2022-02-13 DIAGNOSIS — I701 Atherosclerosis of renal artery: Secondary | ICD-10-CM | POA: Insufficient documentation

## 2022-02-13 NOTE — Progress Notes (Signed)
MRN : [id number]  Dawn Mcintyre is a 88 y.o. (03/17/1933) female who presents with chief complaint of check circulation.  History of Present Illness:   The patient is seen for evaluation of malignant hypertension which has been very difficult to control. The patient has a long history of hypertension which recently has become increasingly difficult to control utilizing medical therapy. The patient has consistently documented systolic blood pressures near 937 with diastolic pressures over 90.   The patient does have family history of hypertension.   There is no prior documented abdominal bruit. The patient occasionally has flushing symptoms but denies palpitations. No episodes of syncope.There is no history of headache. There is no history of flash pulmonary edema.  The patient denies a history of renal disease.  No recent shortening of the patient's walking distance or new symptoms consistent with claudication.  No history of rest pain symptoms. No new ulcers or wounds of the lower extremities have occurred.  The patient denies amaurosis fugax or recent TIA symptoms. There are no recent neurological changes noted. There is no history of DVT, PE or superficial thrombophlebitis. No recent episodes of angina or shortness of breath documented.   Duplex ultrasound of the renal arteries dated 01/17/2022, demonstrates probable >60% right renal artery stenosis.   CT scan of the abdomen and pelvis 10/27/2021 shows large amount of plaque at the origin of the right renal artery.  BUN/ cr from 01/07/2022  13/0.89  No outpatient medications have been marked as taking for the 02/14/22 encounter (Appointment) with Delana Meyer, Dolores Lory, MD.    Past Medical History:  Diagnosis Date   Arteritis [facility])    Arthritis    Atrophic vaginitis 11/16/2014   Carotid artery stenosis    Cataract    Difficult intubation    Environmental and seasonal allergies    GERD (gastroesophageal reflux  disease)    Hyperlipidemia    Hypertension    Lack of bladder control    Neuropathy    Osteoporosis    Pneumonia     x 88yr ago   Pre-diabetes    Shingles    Ulcer     Past Surgical History:  Procedure Laterality Date   ABDOMINAL HYSTERECTOMY  1972   BREAST CYST EXCISION Right 1972   BREAST CYST EXCISION Left 1972   ENDARTERECTOMY Left 12/24/2017   Procedure: ENDARTERECTOMY CAROTID;  Surgeon: SKatha Cabal MD;  Location: [facility] ORS;  Service: Vascular;  Laterality: Left;   ENDARTERECTOMY Right 03/13/2018   Procedure: ENDARTERECTOMY CAROTID;  Surgeon: SKatha Cabal MD;  Location: [facility] ORS;  Service: Vascular;  Laterality: Right;   ESOPHAGOGASTRODUODENOSCOPY (EGD) WITH PROPOFOL N/A 11/28/2020   Procedure: ESOPHAGOGASTRODUODENOSCOPY (EGD) WITH PROPOFOL;  Surgeon: WLucilla Lame MD;  Location: [facility] ENDOSCOPY;  Service: Endoscopy;  Laterality: N/A;   EYE SURGERY     THROAT SURGERY     x 10 yrs ago. throat mass.   TONSILLECTOMY      Social History Social History   Tobacco Use   Smoking status: Never    Passive exposure: Never   Smokeless tobacco: Never  Vaping Use   Vaping Use: Never used  Substance Use Topics   Alcohol use: No    Alcohol/week: 0.0 standard drinks of alcohol   Drug use: No    Family History Family History  Problem Relation Age of Onset   Anuerysm Mother    Prostate cancer Father  Esophageal cancer Sister    Lung cancer Sister    Diabetes Brother    Hearing loss Brother    Bladder Cancer Neg Hx    Kidney cancer Neg Hx    Breast cancer Neg Hx     Allergies  Allergen Reactions   Codeine Hives   Hydrocodone Nausea Only   Metronidazole Nausea Only   Propoxyphene Nausea Only   Sulfa Antibiotics Hives   Amoxicillin-Pot Clavulanate Diarrhea   Latex Rash     REVIEW OF SYSTEMS (Negative unless checked)  Constitutional: '[]'$ Weight loss  '[]'$ Fever  '[]'$ Chills Cardiac: '[]'$ Chest pain   '[]'$ Chest pressure   '[]'$ Palpitations   '[]'$ Shortness of breath  when laying flat   '[]'$ Shortness of breath with exertion. Vascular:  '[x]'$ Pain in legs with walking   '[]'$ Pain in legs at rest  '[]'$ History of DVT   '[]'$ Phlebitis   '[]'$ Swelling in legs   '[]'$ Varicose veins   '[]'$ Non-healing ulcers Pulmonary:   '[]'$ Uses home oxygen   '[]'$ Productive cough   '[]'$ Hemoptysis   '[]'$ Wheeze  '[]'$ COPD   '[]'$ Asthma Neurologic:  '[]'$ Dizziness   '[]'$ Seizures   '[]'$ History of stroke   '[]'$ History of TIA  '[]'$ Aphasia   '[]'$ Vissual changes   '[]'$ Weakness or numbness in arm   '[]'$ Weakness or numbness in leg Musculoskeletal:   '[]'$ Joint swelling   '[]'$ Joint pain   '[]'$ Low back pain Hematologic:  '[]'$ Easy bruising  '[]'$ Easy bleeding   '[]'$ Hypercoagulable state   '[]'$ Anemic Gastrointestinal:  '[]'$ Diarrhea   '[]'$ Vomiting  '[]'$ Gastroesophageal reflux/heartburn   '[]'$ Difficulty swallowing. Genitourinary:  '[]'$ Chronic kidney disease   '[]'$ Difficult urination  '[]'$ Frequent urination   '[]'$ Blood in urine Skin:  '[]'$ Rashes   '[]'$ Ulcers  Psychological:  '[]'$ History of anxiety   '[]'$  History of major depression.  Physical Examination  There were no vitals filed for this visit. There is no height or weight on file to calculate BMI. Gen: WD/WN, NAD Head: [place]/AT, No temporalis wasting.  Ear/Nose/Throat: Hearing grossly intact, nares w/o erythema or drainage Eyes: PER, EOMI, sclera nonicteric.  Neck: Supple, no masses.  No bruit or JVD.  Pulmonary:  Good air movement, no audible wheezing, no use of accessory muscles.  Cardiac: RRR, normal S1, S2, no Murmurs. Vascular:  mild trophic changes, no open wounds Vessel Right Left  Radial Palpable Palpable  PT Palpable  Palpable  DP  Palpable  Palpable  Gastrointestinal: soft, non-distended. No guarding/no peritoneal signs.  Musculoskeletal: M/S 5/5 throughout.  No visible deformity.  Neurologic: CN 2-12 intact. Pain and light touch intact in extremities.  Symmetrical.  Speech is fluent. Motor exam as listed above. Psychiatric: Judgment intact, Mood & affect appropriate for pt's clinical situation. Dermatologic: No rashes  or ulcers noted.  No changes consistent with cellulitis.   CBC Lab Results  Component Value Date   WBC 7.2 01/07/2022   HGB 11.7 (L) 01/07/2022   HCT 36.7 01/07/2022   MCV 90.8 01/07/2022   PLT 200 01/07/2022    BMET    Component Value Date/Time   NA 138 01/07/2022 1046   NA 139 09/12/2011 0250   K 3.4 (L) 01/07/2022 1046   K 3.5 09/12/2011 0250   CL 103 01/07/2022 1046   CL 102 09/12/2011 0250   CO2 27 01/07/2022 1046   CO2 23 09/12/2011 0250   GLUCOSE 128 (H) 01/07/2022 1046   GLUCOSE 162 (H) 09/12/2011 0250   BUN 13 01/07/2022 1046   BUN 20 05/30/2016 1358   BUN 23 (H) 09/12/2011 0250   CREATININE 0.89 01/07/2022 1046   CREATININE 0.94 09/12/2011  0250   CALCIUM 10.1 01/07/2022 1046   CALCIUM 9.1 09/12/2011 0250   GFRNONAA >60 01/07/2022 1046   GFRNONAA >60 09/12/2011 0250   GFRAA 55 (L) 10/04/2019 1828   GFRAA >60 09/12/2011 0250   CrCl cannot be calculated (Patient's most recent lab result is older than the maximum 21 days allowed.).  COAG Lab Results  Component Value Date   INR 1.1 10/28/2021   INR 0.99 03/05/2018   INR 1.02 12/16/2017    Radiology US RENAL ARTERY DUPLEX COMPLETE  Result Date: 01/17/2022 CLINICAL DATA:  Renal artery stenosis EXAM: RENAL/URINARY TRACT ULTRASOUND RENAL DUPLEX DOPPLER ULTRASOUND COMPARISON:  Renal ultrasound 08/16/2021; CT abdomen/pelvis 10/27/2021 FINDINGS: Right Kidney: Length: 9.3 cm. Echogenicity within normal limits. No mass or hydronephrosis visualized. Left Kidney: Length: 9.4 cm. Echogenicity within normal limits. No solid mass or hydronephrosis visualized. Small circumscribed 1.0 x 0.9 x 1.1 cm anechoic simple cyst in the lower pole. Bladder:  Within normal limits RENAL DUPLEX ULTRASOUND Right Renal Artery Velocities: Origin:  298 cm/sec Mid:  62 cm/sec Hilum:  52 cm/sec Interlobar:  44 cm/sec Arcuate:  47 cm/sec Left Renal Artery Velocities: Origin:  179 cm/sec Mid:  117 cm/sec Hilum:  83 cm/sec Interlobar:  86 cm/sec  Arcuate:  33 cm/sec Aortic Velocity:  118 cm/sec Right Renal-Aortic Ratios: Origin: 2.5 Mid:  0.5 Hilum: 0.4 Interlobar: 0.4 Arcuate: 0.4 Left Renal-Aortic Ratios: Origin: 1.5 Mid: 1.0 Hilum: 0.7 Interlobar: 0.7 Arcuate: 0.5 IMPRESSION: 1. Elevated peak systolic velocity at the origin of the right renal artery concerning for a greater than 60% diameter stenosis. 2. No hemodynamically significant stenosis identified on the left. 3. Small simple cyst in the lower pole of the left kidney. 4. Otherwise, unremarkable sonographic appearance of the kidneys. Signed, Criselda Peaches, MD, [facility] Radiology Electronically Signed   By: Jacqulynn Cadet M.D.   On: 01/17/2022 15:57     Assessment/Plan 1. Renal artery stenosis (HCC) Recommend:  BP today was not acceptable with systolic reading >401 and diastolic reading >02 while taking 3 medications.  Given that optimal control of the patient's hypertension is important to minimize the risk of heart attack and/or CVA.  The patient's BP and noninvasive studies suggest the possibility of a hemodynamically significant stricture or stenosis.  Angiography was discussed with the patient, the risks and benefits were reviewed and all questions were answered.  The patient has agreed to proceed with angiography and the intention of intervention.     Arrangements will be made to treat the right renal artery.  The patient will continue the current medications, no changes at this time.  The primary medical service will continue aggressive antihypertensive therapy as per the AHA guidelines     2. Bilateral carotid artery stenosis Recommend:  Given the patient's asymptomatic subcritical stenosis no further invasive testing or surgery at this time.  Duplex ultrasound shows 1-39% stenosis bilaterally s/p bilateral CEA's.  Continue antiplatelet therapy as prescribed Continue management of CAD, HTN and  Hyperlipidemia Healthy heart diet,  encouraged exercise at least 4 times per week Follow up in 12 months from previous exam with duplex ultrasound and physical exam    3. Coronary artery disease of native artery of native heart with stable angina pectoris (HCC) Continue cardiac and antihypertensive medications as already ordered and reviewed, no changes at this time.  Continue statin as ordered and reviewed, no changes at this time  Nitrates PRN for chest pain   4. Essential hypertension Continue  antihypertensive medications as already ordered, these medications have been reviewed and there are no changes at this time.   5. Type 2 diabetes mellitus with stage 3a chronic kidney disease, without long-term current use of insulin (HCC) Continue hypoglycemic medications as already ordered, these medications have been reviewed and there are no changes at this time.  Hgb A1C to be monitored as already arranged by primary service   6. Pure hypercholesterolemia Continue statin as ordered and reviewed, no changes at this time     [name], MD  02/13/2022 5:08 PM

## 2022-02-13 NOTE — H&P (View-Only) (Signed)
MRN : [id number]  Dawn Mcintyre is a 88 y.o. (September 09, 1932) female who presents with chief complaint of check circulation.  History of Present Illness:   The patient is seen for evaluation of malignant hypertension which has been very difficult to control. The patient has a long history of hypertension which recently has become increasingly difficult to control utilizing medical therapy. The patient has consistently documented systolic blood pressures near 003 with diastolic pressures over 90.   The patient does have family history of hypertension.   There is no prior documented abdominal bruit. The patient occasionally has flushing symptoms but denies palpitations. No episodes of syncope.There is no history of headache. There is no history of flash pulmonary edema.  The patient denies a history of renal disease.  No recent shortening of the patient's walking distance or new symptoms consistent with claudication.  No history of rest pain symptoms. No new ulcers or wounds of the lower extremities have occurred.  The patient denies amaurosis fugax or recent TIA symptoms. There are no recent neurological changes noted. There is no history of DVT, PE or superficial thrombophlebitis. No recent episodes of angina or shortness of breath documented.   Duplex ultrasound of the renal arteries dated 01/17/2022, demonstrates probable >60% right renal artery stenosis.   CT scan of the abdomen and pelvis 10/27/2021 shows large amount of plaque at the origin of the right renal artery.  BUN/ cr from 01/07/2022  13/0.89  No outpatient medications have been marked as taking for the 02/14/22 encounter (Appointment) with Delana Meyer, Dolores Lory, MD.    Past Medical History:  Diagnosis Date   Arteritis [facility])    Arthritis    Atrophic vaginitis 11/16/2014   Carotid artery stenosis    Cataract    Difficult intubation    Environmental and seasonal allergies    GERD (gastroesophageal reflux  disease)    Hyperlipidemia    Hypertension    Lack of bladder control    Neuropathy    Osteoporosis    Pneumonia     x 61yr ago   Pre-diabetes    Shingles    Ulcer     Past Surgical History:  Procedure Laterality Date   ABDOMINAL HYSTERECTOMY  1972   BREAST CYST EXCISION Right 1972   BREAST CYST EXCISION Left 1972   ENDARTERECTOMY Left 12/24/2017   Procedure: ENDARTERECTOMY CAROTID;  Surgeon: SKatha Cabal MD;  Location: [facility] ORS;  Service: Vascular;  Laterality: Left;   ENDARTERECTOMY Right 03/13/2018   Procedure: ENDARTERECTOMY CAROTID;  Surgeon: SKatha Cabal MD;  Location: [facility] ORS;  Service: Vascular;  Laterality: Right;   ESOPHAGOGASTRODUODENOSCOPY (EGD) WITH PROPOFOL N/A 11/28/2020   Procedure: ESOPHAGOGASTRODUODENOSCOPY (EGD) WITH PROPOFOL;  Surgeon: WLucilla Lame MD;  Location: [facility] ENDOSCOPY;  Service: Endoscopy;  Laterality: N/A;   EYE SURGERY     THROAT SURGERY     x 10 yrs ago. throat mass.   TONSILLECTOMY      Social History Social History   Tobacco Use   Smoking status: Never    Passive exposure: Never   Smokeless tobacco: Never  Vaping Use   Vaping Use: Never used  Substance Use Topics   Alcohol use: No    Alcohol/week: 0.0 standard drinks of alcohol   Drug use: No    Family History Family History  Problem Relation Age of Onset   Anuerysm Mother    Prostate cancer Father  Esophageal cancer Sister    Lung cancer Sister    Diabetes Brother    Hearing loss Brother    Bladder Cancer Neg Hx    Kidney cancer Neg Hx    Breast cancer Neg Hx     Allergies  Allergen Reactions   Codeine Hives   Hydrocodone Nausea Only   Metronidazole Nausea Only   Propoxyphene Nausea Only   Sulfa Antibiotics Hives   Amoxicillin-Pot Clavulanate Diarrhea   Latex Rash     REVIEW OF SYSTEMS (Negative unless checked)  Constitutional: '[]'$ Weight loss  '[]'$ Fever  '[]'$ Chills Cardiac: '[]'$ Chest pain   '[]'$ Chest pressure   '[]'$ Palpitations   '[]'$ Shortness of breath  when laying flat   '[]'$ Shortness of breath with exertion. Vascular:  '[x]'$ Pain in legs with walking   '[]'$ Pain in legs at rest  '[]'$ History of DVT   '[]'$ Phlebitis   '[]'$ Swelling in legs   '[]'$ Varicose veins   '[]'$ Non-healing ulcers Pulmonary:   '[]'$ Uses home oxygen   '[]'$ Productive cough   '[]'$ Hemoptysis   '[]'$ Wheeze  '[]'$ COPD   '[]'$ Asthma Neurologic:  '[]'$ Dizziness   '[]'$ Seizures   '[]'$ History of stroke   '[]'$ History of TIA  '[]'$ Aphasia   '[]'$ Vissual changes   '[]'$ Weakness or numbness in arm   '[]'$ Weakness or numbness in leg Musculoskeletal:   '[]'$ Joint swelling   '[]'$ Joint pain   '[]'$ Low back pain Hematologic:  '[]'$ Easy bruising  '[]'$ Easy bleeding   '[]'$ Hypercoagulable state   '[]'$ Anemic Gastrointestinal:  '[]'$ Diarrhea   '[]'$ Vomiting  '[]'$ Gastroesophageal reflux/heartburn   '[]'$ Difficulty swallowing. Genitourinary:  '[]'$ Chronic kidney disease   '[]'$ Difficult urination  '[]'$ Frequent urination   '[]'$ Blood in urine Skin:  '[]'$ Rashes   '[]'$ Ulcers  Psychological:  '[]'$ History of anxiety   '[]'$  History of major depression.  Physical Examination  There were no vitals filed for this visit. There is no height or weight on file to calculate BMI. Gen: WD/WN, NAD Head: [place]/AT, No temporalis wasting.  Ear/Nose/Throat: Hearing grossly intact, nares w/o erythema or drainage Eyes: PER, EOMI, sclera nonicteric.  Neck: Supple, no masses.  No bruit or JVD.  Pulmonary:  Good air movement, no audible wheezing, no use of accessory muscles.  Cardiac: RRR, normal S1, S2, no Murmurs. Vascular:  mild trophic changes, no open wounds Vessel Right Left  Radial Palpable Palpable  PT Palpable  Palpable  DP  Palpable  Palpable  Gastrointestinal: soft, non-distended. No guarding/no peritoneal signs.  Musculoskeletal: M/S 5/5 throughout.  No visible deformity.  Neurologic: CN 2-12 intact. Pain and light touch intact in extremities.  Symmetrical.  Speech is fluent. Motor exam as listed above. Psychiatric: Judgment intact, Mood & affect appropriate for pt's clinical situation. Dermatologic: No rashes  or ulcers noted.  No changes consistent with cellulitis.   CBC Lab Results  Component Value Date   WBC 7.2 01/07/2022   HGB 11.7 (L) 01/07/2022   HCT 36.7 01/07/2022   MCV 90.8 01/07/2022   PLT 200 01/07/2022    BMET    Component Value Date/Time   NA 138 01/07/2022 1046   NA 139 09/12/2011 0250   K 3.4 (L) 01/07/2022 1046   K 3.5 09/12/2011 0250   CL 103 01/07/2022 1046   CL 102 09/12/2011 0250   CO2 27 01/07/2022 1046   CO2 23 09/12/2011 0250   GLUCOSE 128 (H) 01/07/2022 1046   GLUCOSE 162 (H) 09/12/2011 0250   BUN 13 01/07/2022 1046   BUN 20 05/30/2016 1358   BUN 23 (H) 09/12/2011 0250   CREATININE 0.89 01/07/2022 1046   CREATININE 0.94 09/12/2011  0250   CALCIUM 10.1 01/07/2022 1046   CALCIUM 9.1 09/12/2011 0250   GFRNONAA >60 01/07/2022 1046   GFRNONAA >60 09/12/2011 0250   GFRAA 55 (L) 10/04/2019 1828   GFRAA >60 09/12/2011 0250   CrCl cannot be calculated (Patient's most recent lab result is older than the maximum 21 days allowed.).  COAG Lab Results  Component Value Date   INR 1.1 10/28/2021   INR 0.99 03/05/2018   INR 1.02 12/16/2017    Radiology US RENAL ARTERY DUPLEX COMPLETE  Result Date: 01/17/2022 CLINICAL DATA:  Renal artery stenosis EXAM: RENAL/URINARY TRACT ULTRASOUND RENAL DUPLEX DOPPLER ULTRASOUND COMPARISON:  Renal ultrasound 08/16/2021; CT abdomen/pelvis 10/27/2021 FINDINGS: Right Kidney: Length: 9.3 cm. Echogenicity within normal limits. No mass or hydronephrosis visualized. Left Kidney: Length: 9.4 cm. Echogenicity within normal limits. No solid mass or hydronephrosis visualized. Small circumscribed 1.0 x 0.9 x 1.1 cm anechoic simple cyst in the lower pole. Bladder:  Within normal limits RENAL DUPLEX ULTRASOUND Right Renal Artery Velocities: Origin:  298 cm/sec Mid:  62 cm/sec Hilum:  52 cm/sec Interlobar:  44 cm/sec Arcuate:  47 cm/sec Left Renal Artery Velocities: Origin:  179 cm/sec Mid:  117 cm/sec Hilum:  83 cm/sec Interlobar:  86 cm/sec  Arcuate:  33 cm/sec Aortic Velocity:  118 cm/sec Right Renal-Aortic Ratios: Origin: 2.5 Mid:  0.5 Hilum: 0.4 Interlobar: 0.4 Arcuate: 0.4 Left Renal-Aortic Ratios: Origin: 1.5 Mid: 1.0 Hilum: 0.7 Interlobar: 0.7 Arcuate: 0.5 IMPRESSION: 1. Elevated peak systolic velocity at the origin of the right renal artery concerning for a greater than 60% diameter stenosis. 2. No hemodynamically significant stenosis identified on the left. 3. Small simple cyst in the lower pole of the left kidney. 4. Otherwise, unremarkable sonographic appearance of the kidneys. Signed, Criselda Peaches, MD, [place] [facility] Radiology Electronically Signed   By: Jacqulynn Cadet M.D.   On: 01/17/2022 15:57     Assessment/Plan 1. Renal artery stenosis (HCC) Recommend:  BP today was not acceptable with systolic reading >168 and diastolic reading >37 while taking 3 medications.  Given that optimal control of the patient's hypertension is important to minimize the risk of heart attack and/or CVA.  The patient's BP and noninvasive studies suggest the possibility of a hemodynamically significant stricture or stenosis.  Angiography was discussed with the patient, the risks and benefits were reviewed and all questions were answered.  The patient has agreed to proceed with angiography and the intention of intervention.     Arrangements will be made to treat the right renal artery.  The patient will continue the current medications, no changes at this time.  The primary medical service will continue aggressive antihypertensive therapy as per the AHA guidelines     2. Bilateral carotid artery stenosis Recommend:  Given the patient's asymptomatic subcritical stenosis no further invasive testing or surgery at this time.  Duplex ultrasound shows 1-39% stenosis bilaterally s/p bilateral CEA's.  Continue antiplatelet therapy as prescribed Continue management of CAD, HTN and  Hyperlipidemia Healthy heart diet,  encouraged exercise at least 4 times per week Follow up in 12 months from previous exam with duplex ultrasound and physical exam    3. Coronary artery disease of native artery of native heart with stable angina pectoris (HCC) Continue cardiac and antihypertensive medications as already ordered and reviewed, no changes at this time.  Continue statin as ordered and reviewed, no changes at this time  Nitrates PRN for chest pain   4. Essential hypertension Continue  antihypertensive medications as already ordered, these medications have been reviewed and there are no changes at this time.   5. Type 2 diabetes mellitus with stage 3a chronic kidney disease, without long-term current use of insulin (HCC) Continue hypoglycemic medications as already ordered, these medications have been reviewed and there are no changes at this time.  Hgb A1C to be monitored as already arranged by primary service   6. Pure hypercholesterolemia Continue statin as ordered and reviewed, no changes at this time     [name], MD  02/13/2022 5:08 PM

## 2022-02-14 ENCOUNTER — Ambulatory Visit (INDEPENDENT_AMBULATORY_CARE_PROVIDER_SITE_OTHER): Payer: Medicare Other | Admitting: Vascular Surgery

## 2022-02-14 ENCOUNTER — Encounter (INDEPENDENT_AMBULATORY_CARE_PROVIDER_SITE_OTHER): Payer: Self-pay | Admitting: Vascular Surgery

## 2022-02-14 VITALS — BP 107/51 | HR 55 | Resp 16 | Wt 132.4 lb

## 2022-02-14 DIAGNOSIS — N1831 Chronic kidney disease, stage 3a: Secondary | ICD-10-CM

## 2022-02-14 DIAGNOSIS — E1122 Type 2 diabetes mellitus with diabetic chronic kidney disease: Secondary | ICD-10-CM

## 2022-02-14 DIAGNOSIS — I10 Essential (primary) hypertension: Secondary | ICD-10-CM

## 2022-02-14 DIAGNOSIS — I6523 Occlusion and stenosis of bilateral carotid arteries: Secondary | ICD-10-CM | POA: Diagnosis not present

## 2022-02-14 DIAGNOSIS — I25118 Atherosclerotic heart disease of native coronary artery with other forms of angina pectoris: Secondary | ICD-10-CM

## 2022-02-14 DIAGNOSIS — I701 Atherosclerosis of renal artery: Secondary | ICD-10-CM

## 2022-02-14 DIAGNOSIS — E7800 Pure hypercholesterolemia, unspecified: Secondary | ICD-10-CM

## 2022-02-15 ENCOUNTER — Encounter (INDEPENDENT_AMBULATORY_CARE_PROVIDER_SITE_OTHER): Payer: Self-pay | Admitting: Vascular Surgery

## 2022-02-19 ENCOUNTER — Other Ambulatory Visit: Payer: Self-pay | Admitting: Internal Medicine

## 2022-02-19 DIAGNOSIS — Z1231 Encounter for screening mammogram for malignant neoplasm of breast: Secondary | ICD-10-CM

## 2022-02-21 ENCOUNTER — Telehealth (INDEPENDENT_AMBULATORY_CARE_PROVIDER_SITE_OTHER): Payer: Self-pay

## 2022-02-21 NOTE — Telephone Encounter (Signed)
Spoke with the patient and she is scheduled with Dr. Delana Meyer for a renal artery stent placement on 03/05/22 with a 6:45 am arrival time to the [facility]. Pre-procedure instructions were discussed and will be mailed.

## 2022-03-05 ENCOUNTER — Encounter: Payer: Self-pay | Admitting: Vascular Surgery

## 2022-03-05 ENCOUNTER — Other Ambulatory Visit: Payer: Self-pay

## 2022-03-05 ENCOUNTER — Encounter
Admission: RE | Disposition: A | Discharge status: Home / Self Care | Payer: Self-pay | Source: Home / Self Care | Attending: Vascular Surgery

## 2022-03-05 ENCOUNTER — Ambulatory Visit
Admission: RE | Admit: 2022-03-05 | Discharge: 2022-03-05 | Disposition: A | Discharge status: Home / Self Care | Payer: Medicare Other | Attending: Vascular Surgery | Admitting: Vascular Surgery

## 2022-03-05 DIAGNOSIS — N1831 Chronic kidney disease, stage 3a: Secondary | ICD-10-CM | POA: Insufficient documentation

## 2022-03-05 DIAGNOSIS — E1122 Type 2 diabetes mellitus with diabetic chronic kidney disease: Secondary | ICD-10-CM | POA: Insufficient documentation

## 2022-03-05 DIAGNOSIS — E7800 Pure hypercholesterolemia, unspecified: Secondary | ICD-10-CM | POA: Diagnosis not present

## 2022-03-05 DIAGNOSIS — I2510 Atherosclerotic heart disease of native coronary artery without angina pectoris: Secondary | ICD-10-CM | POA: Insufficient documentation

## 2022-03-05 DIAGNOSIS — I701 Atherosclerosis of renal artery: Secondary | ICD-10-CM | POA: Diagnosis not present

## 2022-03-05 DIAGNOSIS — I150 Renovascular hypertension: Secondary | ICD-10-CM | POA: Diagnosis not present

## 2022-03-05 DIAGNOSIS — Z8249 Family history of ischemic heart disease and other diseases of the circulatory system: Secondary | ICD-10-CM | POA: Diagnosis not present

## 2022-03-05 DIAGNOSIS — I6523 Occlusion and stenosis of bilateral carotid arteries: Secondary | ICD-10-CM | POA: Diagnosis not present

## 2022-03-05 DIAGNOSIS — I129 Hypertensive chronic kidney disease with stage 1 through stage 4 chronic kidney disease, or unspecified chronic kidney disease: Secondary | ICD-10-CM | POA: Diagnosis not present

## 2022-03-05 DIAGNOSIS — I25118 Atherosclerotic heart disease of native coronary artery with other forms of angina pectoris: Secondary | ICD-10-CM | POA: Diagnosis not present

## 2022-03-05 HISTORY — PX: RENAL ANGIOGRAPHY: CATH118260

## 2022-03-05 LAB — BUN: BUN: 14 mg/dL (ref 8–23)

## 2022-03-05 LAB — CREATININE, SERUM
Creatinine, Ser: 0.9 mg/dL (ref 0.44–1.00)
GFR, Estimated: >60 mL/min (ref >60)

## 2022-03-05 SURGERY — RENAL ANGIOGRAPHY
Anesthesia: Moderate Sedation

## 2022-03-05 MED ORDER — MIDAZOLAM HCL 2 MG/2ML IJ SOLN
INTRAMUSCULAR | Status: AC
  Filled 2022-03-05: qty 4

## 2022-03-05 MED ORDER — HEPARIN SODIUM (PORCINE) 1000 UNIT/ML IJ SOLN
INTRAMUSCULAR | Status: DC | PRN
  Administered 2022-03-05: 5000 [IU] via INTRAVENOUS

## 2022-03-05 MED ORDER — DIPHENHYDRAMINE HCL 50 MG/ML IJ SOLN: 50.0000 mg | Freq: Once | INTRAMUSCULAR | Status: DC | PRN

## 2022-03-05 MED ORDER — MIDAZOLAM HCL 2 MG/ML PO SYRP: 8.0000 mg | ORAL_SOLUTION | Freq: Once | ORAL | Status: DC | PRN

## 2022-03-05 MED ORDER — IODIXANOL 320 MG/ML IV SOLN
INTRAVENOUS | Status: DC | PRN
  Administered 2022-03-05: 55 mL via INTRA_ARTERIAL

## 2022-03-05 MED ORDER — HYDROMORPHONE HCL 1 MG/ML IJ SOLN: 1.0000 mg | Freq: Once | INTRAMUSCULAR | Status: DC | PRN

## 2022-03-05 MED ORDER — METHYLPREDNISOLONE SODIUM SUCC 125 MG IJ SOLR: 125.0000 mg | Freq: Once | INTRAMUSCULAR | Status: DC | PRN

## 2022-03-05 MED ORDER — SODIUM CHLORIDE 0.9 % IV SOLN
INTRAVENOUS | Status: DC
Start: 2022-03-05 — End: 2022-03-05

## 2022-03-05 MED ORDER — ONDANSETRON HCL 4 MG/2ML IJ SOLN: 4.0000 mg | Freq: Four times a day (QID) | INTRAMUSCULAR | Status: DC | PRN

## 2022-03-05 MED ORDER — HEPARIN SODIUM (PORCINE) 1000 UNIT/ML IJ SOLN
INTRAMUSCULAR | Status: AC
  Filled 2022-03-05: qty 10

## 2022-03-05 MED ORDER — FAMOTIDINE 20 MG PO TABS: 40.0000 mg | ORAL_TABLET | Freq: Once | ORAL | Status: DC | PRN

## 2022-03-05 MED ORDER — ATORVASTATIN CALCIUM 10 MG PO TABS: 10.0000 mg | ORAL_TABLET | Freq: Every day | ORAL | 4 refills | Status: AC

## 2022-03-05 MED ORDER — MIDAZOLAM HCL 2 MG/2ML IJ SOLN
INTRAMUSCULAR | Status: DC | PRN
  Administered 2022-03-05: .5 mg via INTRAVENOUS
  Administered 2022-03-05: 1 mg via INTRAVENOUS

## 2022-03-05 MED ORDER — FENTANYL CITRATE (PF) 100 MCG/2ML IJ SOLN
INTRAMUSCULAR | Status: DC | PRN
  Administered 2022-03-05: 50 ug via INTRAVENOUS

## 2022-03-05 MED ORDER — FENTANYL CITRATE PF 50 MCG/ML IJ SOSY
PREFILLED_SYRINGE | INTRAMUSCULAR | Status: AC
  Filled 2022-03-05: qty 2

## 2022-03-05 MED ORDER — VANCOMYCIN HCL IN DEXTROSE 1-5 GM/200ML-% IV SOLN
1000.0000 mg | INTRAVENOUS | Status: AC
Start: 2022-03-05 — End: 2022-03-05
  Administered 2022-03-05: 1000 mg via INTRAVENOUS
  Filled 2022-03-05: qty 200

## 2022-03-05 MED ORDER — CLOPIDOGREL BISULFATE 75 MG PO TABS: 75.0000 mg | ORAL_TABLET | Freq: Every day | ORAL | 4 refills | Status: DC

## 2022-03-05 SURGICAL SUPPLY — 20 items
CATH ANGIO 5F 80CM MHK1-H (CATHETERS) ×1 IMPLANT
CATH ANGIO 5F PIGTAIL 65CM (CATHETERS) ×1 IMPLANT
COVER PROBE U/S 5X48 (MISCELLANEOUS) ×1 IMPLANT
DEVICE STARCLOSE SE CLOSURE (Vascular Products) ×1 IMPLANT
DEVICE TORQUE .025-.038 (MISCELLANEOUS) ×1 IMPLANT
GLIDECATH 4FR STR (CATHETERS) ×1 IMPLANT
GLIDEWIRE STIFF .35X180X3 HYDR (WIRE) ×1 IMPLANT
GUIDEWIRE VERSACORE 175CM (WIRE) ×1 IMPLANT
KIT ENCORE 26 ADVANTAGE (KITS) ×1 IMPLANT
NDL ENTRY 21GA 7CM ECHOTIP (NEEDLE) IMPLANT
NEEDLE ENTRY 21GA 7CM ECHOTIP (NEEDLE) ×2 IMPLANT
PACK ANGIOGRAPHY (CUSTOM PROCEDURE TRAY) ×2 IMPLANT
SET INTRO CAPELLA COAXIAL (SET/KITS/TRAYS/PACK) ×1 IMPLANT
SHEATH ANL2 6FRX45 HC (SHEATH) ×1 IMPLANT
SHEATH BRITE TIP 5FRX11 (SHEATH) ×1 IMPLANT
SHEATH RAABE 6FR (SHEATH) ×1 IMPLANT
STENT LIFESTREAM 6X16X80 (Permanent Stent) ×1 IMPLANT
SYR MEDRAD MARK 7 150ML (SYRINGE) ×1 IMPLANT
WIRE GUIDERIGHT .035X150 (WIRE) ×1 IMPLANT
WIRE ROSEN-J .035X260CM (WIRE) ×1 IMPLANT

## 2022-03-05 NOTE — Op Note (Signed)
[facility]  Percutaneous Study/Intervention Procedural Note    Surgeon(s): Mudlogger: None  Pre-operative Diagnosis: Bilateral renal artery stenosis, poorly controlled renovascular hypertension  Post-operative diagnosis:  Same  Procedure(s) Performed:             1.  Ultrasound guidance for vascular access right femoral artery             2.  Catheter placement into right and left renal arteries from right femoral approach             3.  Aortogram and selective bilateral renal angiogram             4.  Balloon expandable stent placement to right renal artery with a 6 mm diameter x 16 mm length lifestream stent              5.  StarClose closure device right femoral artery              Contrast: 55 cc  EBL: Less than 10 cc  Fluoro Time: 7.6 minutes  Moderate conscious sedation: Continuous ECG pulse oximetry and cardiopulmonary monitoring was performed throughout the entire procedure by the intermittent general radiology nurse.  Parenteral Versed and fentanyl were administered.  Total sedation time was 42 minutes  Indications:  The patient is a 88 year old woman with worsening severe hypertension despite 6 antihypertensive medications. The patient has suboptimal blood pressure control despite multiple antihypertensives and a noninvasive study demonstrating hemodynamically significant right renal artery stenosis with a strong indication of left renal artery stenosis as well. Given the clinical scenario and the noninvasive findings, angiogram is indicated for further evaluation of her renal artery and potential treatment. Risks and benefits are discussed and informed consent is obtained.  Procedure:  The patient was identified and appropriate procedural time out was performed.  The patient was then placed supine on the table and prepped and draped in the usual sterile fashion. Moderate conscious sedation was administered with a face to face  encounter with the patient throughout the procedure with my supervision of the RN administering medicines and monitoring the patients vital signs and mental status throughout from the start of the procedure until the patient was taken to the recovery room  Ultrasound was used to evaluate the right common femoral artery.  It was patent .  A digital ultrasound image was acquired.  A micropuncture needle was used to access the right common femoral artery under direct ultrasound guidance and a permanent image was performed.  A microwire was advanced under fluoroscopic guidance followed by microsheath.  A 0.035 J wire was advanced without resistance and a 5Fr sheath was placed.  Pigtail catheter was placed into the aorta at the L1 level and an AP aortogram was performed. This demonstrated the approximate location of the renal arteries but both ostia were obscured. The patient was then systemically heparinized with 5000 units of intravenous heparin. I used a V S1 catheter to cannulate the right renal artery and selective imaging was performed. This confirmed a 90% stenosis of the right renal artery ostia.  At this point I selected the versa core wire and crossed the lesion without difficulty.  Several wire exchanges were required secondary to track ability but suitability of the wires available.  Ultimately a 4 French glide catheter was negotiated into the more distal renal artery and then a Rosen wire was positioned through using the glide catheter.  I then selected a 6 mm diameter  x 16 mm length lifestream balloon expandable stent and brought this across the lesion.  This was deployed encompassing the lesion with its proximal extent going back into the aorta for a mm or two.  This was inflated to 12 ATM and the waist resolved.  Completion angiogram showed less than 10% residual stenosis with wide patency of the right renal artery and preservation of distal runoff.  Wire and sheath were then pulled back into the  distal infrarenal aorta and the V S1 catheter reintroduced.  The V S1 was then used to select the left renal artery and hand-injection of contrast was performed demonstrating wide patency of the distal anatomy.  The versa core wire was reintroduced and a Rabie sheath was advanced up to the ostia where bilateral oblique views of the origin of the right left renal artery were obtained which demonstrates a 70% stenosis at the origin of the left renal artery.  The wire was then removed and the sheath pulled back into the right external iliac artery. Oblique arteriogram was performed of the right femoral artery and StarClose closure device was deployed in the usual fashion with excellent hemostatic result. The patient was taken to the recovery room in stable condition having tolerated the procedure well.  Findings:               Aortogram/Renal Arteries: Initial imaging of the aorta in the AP demonstrates the proximal location of both renal arteries but the ostia are both completely obscured.  After selecting the right and performing oblique imaging a greater than 90% stenosis at the origin of the right renal artery is demonstrated with wide patency of the distal renal artery and normal size nephrograms.  Following angioplasty and stent placement to 6 mm there is less than 10% residual stenosis as described above.  The left renal artery again demonstrates wide patency distally however, oblique views to demonstrate the ostia demonstrate approximately a 70% stenosis.  At this point in time my plan would be to monitor her response to the right renal artery intervention.  And see what adjustments are needed to her numerous antihypertensive medications.  I believe intervention of the left renal artery is warranted if we still have not achieved adequate control of her hypertension.  The corollary of course is if the right renal artery intervention results in an excellent improvement and we are able to better control her  blood pressure swings and perhaps remove 2 or 3 medications then monitoring her left renal artery would be appropriate.   Condition:  Stable  Complications: None   [name] 03/05/2022 9:20 AM  This note was created with Dragon Medical transcription system. Any errors in dictation are purely unintentional.

## 2022-03-05 NOTE — Interval H&P Note (Signed)
History and Physical Interval Note:  03/05/2022 8:08 AM  Dawn Mcintyre  has presented today for surgery, with the diagnosis of Renal Artery Stent Placement   Renal Artery Stenosis.  The various methods of treatment have been discussed with the patient and family. After consideration of risks, benefits and other options for treatment, the patient has consented to  Procedure(s): RENAL ANGIOGRAPHY (N/A) as a surgical intervention.  The patient's history has been reviewed, patient examined, no change in status, stable for surgery.  I have reviewed the patient's chart and labs.  Questions were answered to the patient's satisfaction.     [name]

## 2022-03-28 ENCOUNTER — Other Ambulatory Visit: Payer: Self-pay | Admitting: Obstetrics & Gynecology

## 2022-04-09 ENCOUNTER — Other Ambulatory Visit (HOSPITAL_COMMUNITY)
Admission: RE | Admit: 2022-04-09 | Discharge: 2022-04-09 | Disposition: A | Discharge status: Home / Self Care | Payer: Medicare Other | Source: Ambulatory Visit | Attending: Obstetrics & Gynecology | Admitting: Obstetrics & Gynecology

## 2022-04-09 ENCOUNTER — Ambulatory Visit: Payer: Medicare Other | Admitting: Obstetrics & Gynecology

## 2022-04-09 VITALS — BP 116/64 | HR 60

## 2022-04-09 DIAGNOSIS — N760 Acute vaginitis: Secondary | ICD-10-CM

## 2022-04-09 MED ORDER — NYSTATIN 100000 UNIT/GM EX CREA: 1.0000 | TOPICAL_CREAM | Freq: Two times a day (BID) | CUTANEOUS | 1 refills | Status: DC

## 2022-04-09 MED ORDER — FLUCONAZOLE 150 MG PO TABS: 150.0000 mg | ORAL_TABLET | ORAL | 3 refills | Status: DC

## 2022-04-09 NOTE — Progress Notes (Signed)
GYNECOLOGY OFFICE VISIT NOTE  History:   Dawn Mcintyre is a 88 y.o. [id number] here today for evaluation of vulvar and vaginal irritation for one week, with associated white discharge. Thinks she has a yeast infection. She denies any abnormal vaginal bleeding, pelvic pain or other concerns.    Past Medical History:  Diagnosis Date   Arteritis ([place])    Arthritis    Atrophic vaginitis 11/16/2014   Carotid artery stenosis    Cataract    Difficult intubation    Environmental and seasonal allergies    GERD (gastroesophageal reflux disease)    Hyperlipidemia    Hypertension    Lack of bladder control    Neuropathy    Osteoporosis    Pneumonia     x 46yr ago   Pre-diabetes    Shingles    Ulcer     Past Surgical History:  Procedure Laterality Date   ABDOMINAL HYSTERECTOMY  1972   BREAST CYST EXCISION Right 1972   BREAST CYST EXCISION Left 1972   ENDARTERECTOMY Left 12/24/2017   Procedure: ENDARTERECTOMY CAROTID;  Surgeon: SKatha Cabal MD;  Location: [facility] ORS;  Service: Vascular;  Laterality: Left;   ENDARTERECTOMY Right 03/13/2018   Procedure: ENDARTERECTOMY CAROTID;  Surgeon: SKatha Cabal MD;  Location: [facility] ORS;  Service: Vascular;  Laterality: Right;   ESOPHAGOGASTRODUODENOSCOPY (EGD) WITH PROPOFOL N/A 11/28/2020   Procedure: ESOPHAGOGASTRODUODENOSCOPY (EGD) WITH PROPOFOL;  Surgeon: WLucilla Lame MD;  Location: [facility] ENDOSCOPY;  Service: Endoscopy;  Laterality: N/A;   EYE SURGERY     RENAL ANGIOGRAPHY N/A 03/05/2022   Procedure: RENAL ANGIOGRAPHY;  Surgeon: SKatha Cabal MD;  [facility];  Service: Cardiovascular;  Laterality: N/A;   THROAT SURGERY     x 10 yrs ago. throat mass.   TONSILLECTOMY      The following portions of the patient's history were reviewed and updated as appropriate: allergies, current medications, past family history, past medical history, past social history, past surgical history and problem list.   Review of  Systems:  Pertinent items noted in HPI and remainder of comprehensive ROS otherwise negative.  Physical Exam:  BP 116/64   Pulse 60  CONSTITUTIONAL: Well-developed, well-nourished female in no acute distress.  HEENT:  Normocephalic, atraumatic. External right and left ear normal. No scleral icterus.  NECK: Normal range of motion, supple, no masses noted on observation SKIN: No rash noted. Not diaphoretic. No erythema. No pallor. MUSCULOSKELETAL: Normal range of motion. No edema noted. NEUROLOGIC: Alert and oriented to person, place, and time. Normal muscle tone coordination. No cranial nerve deficit noted. PSYCHIATRIC: Normal mood and affect. Normal behavior. Normal judgment and thought content. CARDIOVASCULAR: Normal heart rate noted RESPIRATORY: Effort and breath sounds normal, no problems with respiration noted ABDOMEN: No masses noted. No other overt distention noted.   PELVIC: External genitalia with erythema around introitus, moderate atrophy noted. White discharge noted, testing sample obtained. Performed in the presence of a chaperone      Assessment and Plan:    1. Vulvovaginitis Will presumptively treat for yeast vulvovaginitis, but will also follow up test results. POCT UA negative for UTI. - fluconazole (DIFLUCAN) 150 MG tablet; Take 1 tablet (150 mg total) by mouth every 3 (three) days. For three doses  Dispense: 3 tablet; Refill: 3 - Cervicovaginal ancillary only - nystatin cream (MYCOSTATIN); Apply 1 Application topically 2 (two) times daily.  Dispense: 30 g; Refill: 1 Proper vulvar hygiene emphasized: discussed avoidance of perfumed soaps, detergents, lotions  and any type of douches; in addition to wearing cotton underwear and no underwear at night.  Also recommended cleaning front to back. Routine preventative health maintenance measures emphasized. Please refer to After Visit Summary for other counseling recommendations.   Return for any gynecologic concerns.    I  spent 20 minutes dedicated to the care of this patient including pre-visit review of records, face to face time with the patient discussing her conditions and treatments and post visit orders.    Verita Schneiders, MD, [employer] for [employer], [place]

## 2022-04-11 ENCOUNTER — Other Ambulatory Visit (INDEPENDENT_AMBULATORY_CARE_PROVIDER_SITE_OTHER): Payer: Self-pay | Admitting: Vascular Surgery

## 2022-04-11 DIAGNOSIS — Z95820 Peripheral vascular angioplasty status with implants and grafts: Secondary | ICD-10-CM

## 2022-04-11 DIAGNOSIS — I701 Atherosclerosis of renal artery: Secondary | ICD-10-CM

## 2022-04-15 ENCOUNTER — Ambulatory Visit (INDEPENDENT_AMBULATORY_CARE_PROVIDER_SITE_OTHER): Payer: Medicare Other

## 2022-04-15 ENCOUNTER — Ambulatory Visit (INDEPENDENT_AMBULATORY_CARE_PROVIDER_SITE_OTHER): Payer: Medicare Other | Admitting: Nurse Practitioner

## 2022-04-15 ENCOUNTER — Encounter (INDEPENDENT_AMBULATORY_CARE_PROVIDER_SITE_OTHER): Payer: Self-pay | Admitting: Nurse Practitioner

## 2022-04-15 VITALS — BP 191/72 | HR 57 | Resp 16 | Wt 133.0 lb

## 2022-04-15 DIAGNOSIS — Z95820 Peripheral vascular angioplasty status with implants and grafts: Secondary | ICD-10-CM | POA: Diagnosis not present

## 2022-04-15 DIAGNOSIS — I701 Atherosclerosis of renal artery: Secondary | ICD-10-CM | POA: Diagnosis not present

## 2022-04-15 DIAGNOSIS — I6523 Occlusion and stenosis of bilateral carotid arteries: Secondary | ICD-10-CM | POA: Diagnosis not present

## 2022-04-15 LAB — CERVICOVAGINAL ANCILLARY ONLY
Bacterial Vaginitis (gardnerella): NEGATIVE
Candida Glabrata: NEGATIVE
Candida Vaginitis: NEGATIVE
Comment: NEGATIVE
Comment: NEGATIVE
Comment: NEGATIVE

## 2022-04-16 ENCOUNTER — Encounter (INDEPENDENT_AMBULATORY_CARE_PROVIDER_SITE_OTHER): Payer: Self-pay | Admitting: Nurse Practitioner

## 2022-04-16 NOTE — Progress Notes (Signed)
Subjective:    Patient ID: Dawn Mcintyre, female    DOB: 01-Nov-1932, 88 y.o.   MRN: [id number] Chief Complaint  Patient presents with   Follow-up    [facility] 3 week follow up    Dawn Mcintyre is a 88 year old female that returns today following right renal artery stent placement on 03/05/2022.  It is noted that the patient's blood pressure is elevated currently but she notes that she has not had any medication this morning.  The patient notes that typically when she takes her medication her blood pressure typically ranges in the 370 systolic range.  She notes that in fact she has been having some recent low blood pressures as low as 99 systolic.  She notes that since the stent placement her blood pressure has been getting lower than before she has had to adjust her medications to ensure she does not have hypotensive episodes.  She is planning on visiting her cardiologist soon for adjustment of her medications.  It was also noted that during angiogram there was a distal 70% stenosis of the left renal artery today the patient has a patent right stent with no evidence of significant stenosis.  The left has a 1 to 59% stenosis.    Review of Systems  All other systems reviewed and are negative.      Objective:   Physical Exam Vitals reviewed.  HENT:     Head: Normocephalic.  Cardiovascular:     Rate and Rhythm: Normal rate.     Pulses: Normal pulses.  Pulmonary:     Effort: Pulmonary effort is normal.  Skin:    General: Skin is warm and dry.  Neurological:     Mental Status: She is alert and oriented to person, place, and time.  Psychiatric:        Mood and Affect: Mood normal.        Behavior: Behavior normal.        Thought Content: Thought content normal.        Judgment: Judgment normal.     BP (!) 191/72 (BP Location: Left Arm)   Pulse (!) 57   Resp 16   Wt 133 lb (60.3 kg)   BMI 20.83 kg/m   Past Medical History:  Diagnosis Date   Arteritis ([place])    Arthritis     Atrophic vaginitis 11/16/2014   Carotid artery stenosis    Cataract    Difficult intubation    Environmental and seasonal allergies    GERD (gastroesophageal reflux disease)    Hyperlipidemia    Hypertension    Lack of bladder control    Neuropathy    Osteoporosis    Pneumonia     x 75yr ago   Pre-diabetes    Shingles    Ulcer     Social History   Socioeconomic History   Marital status: Widowed    Spouse name: Not on file   Number of children: Not on file   Years of education: Not on file   Highest education level: Not on file  Occupational History   Not on file  Tobacco Use   Smoking status: Never    Passive exposure: Never   Smokeless tobacco: Never  Vaping Use   Vaping Use: Never used  Substance and Sexual Activity   Alcohol use: No    Alcohol/week: 0.0 standard drinks of alcohol   Drug use: No   Sexual activity: Not Currently  Other Topics Concern   Not on file  Social History Narrative   Not on file   Social Determinants of Health   Financial Resource Strain: Not on file  Food Insecurity: Not on file  Transportation Needs: Not on file  Physical Activity: Not on file  Stress: Not on file  Social Connections: Not on file  Intimate Partner Violence: Not on file    Past Surgical History:  Procedure Laterality Date   [place] Left 12/24/2017   Procedure: ENDARTERECTOMY CAROTID;  Surgeon: Katha Cabal, MD;  Location: [facility];  Service: Vascular;  Laterality: Left;   ENDARTERECTOMY Right 03/13/2018   Procedure: ENDARTERECTOMY CAROTID;  Surgeon: Katha Cabal, MD;  Location: [facility];  Service: Vascular;  Laterality: Right;   ESOPHAGOGASTRODUODENOSCOPY (EGD) WITH PROPOFOL N/A 11/28/2020   Procedure: ESOPHAGOGASTRODUODENOSCOPY (EGD) WITH PROPOFOL;  Surgeon: Lucilla Lame, MD;  Location: [facility];  Service: Endoscopy;  Laterality: N/A;   EYE  SURGERY     RENAL ANGIOGRAPHY N/A 03/05/2022   Procedure: RENAL ANGIOGRAPHY;  Surgeon: Katha Cabal, MD;  Location: [facility];  Service: Cardiovascular;  Laterality: N/A;   THROAT SURGERY     x 10 yrs ago. throat mass.   TONSILLECTOMY      Family History  Problem Relation Age of Onset   Anuerysm Mother    Prostate cancer Father    Esophageal cancer Sister    Lung cancer Sister    Diabetes Brother    Hearing loss Brother    Bladder Cancer Neg Hx    Kidney cancer Neg Hx    Breast cancer Neg Hx     Allergies  Allergen Reactions   Codeine Hives   Hydrocodone Nausea Only   Metronidazole Nausea Only   Propoxyphene Nausea Only   Sulfa Antibiotics Hives   Amoxicillin-Pot Clavulanate Diarrhea   Latex Rash       Latest Ref Rng & Units 01/07/2022   10:46 AM 12/29/2021   12:36 PM 12/19/2021    2:27 PM  CBC  WBC 4.0 - 10.5 K/uL 7.2  8.1  8.8   Hemoglobin 12.0 - 15.0 g/dL 11.7  11.6  11.4   Hematocrit 36.0 - 46.0 % 36.7  36.8  36.4   Platelets 150 - 400 K/uL 200  198  218       CMP     Component Value Date/Time   NA 138 01/07/2022 1046   NA 139 09/12/2011 0250   K 3.4 (L) 01/07/2022 1046   K 3.5 09/12/2011 0250   CL 103 01/07/2022 1046   CL 102 09/12/2011 0250   CO2 27 01/07/2022 1046   CO2 23 09/12/2011 0250   GLUCOSE 128 (H) 01/07/2022 1046   GLUCOSE 162 (H) 09/12/2011 0250   BUN 14 03/05/2022 0725   BUN 20 05/30/2016 1358   BUN 23 (H) 09/12/2011 0250   CREATININE 0.90 03/05/2022 0725   CREATININE 0.94 09/12/2011 0250   CALCIUM 10.1 01/07/2022 1046   CALCIUM 9.1 09/12/2011 0250   PROT 6.6 01/07/2022 1046   PROT 7.2 09/12/2011 0250   ALBUMIN 3.7 01/07/2022 1046   ALBUMIN 3.6 09/12/2011 0250   AST 20 01/07/2022 1046   AST 21 09/12/2011 0250   ALT 10 01/07/2022 1046   ALT 14 09/12/2011 0250   ALKPHOS 69 01/07/2022 1046   ALKPHOS 58 09/12/2011 0250   BILITOT 0.7 01/07/2022 1046   BILITOT 0.5 09/12/2011 0250  GFRNONAA >60 03/05/2022 0725    GFRNONAA >60 09/12/2011 0250   GFRAA 55 (L) 10/04/2019 1828   GFRAA >60 09/12/2011 0250     No results found.     Assessment & Plan:   1. Renal artery stenosis (HCC) Although the patient's blood pressure is elevated today, per the patient's reports they have been drastically improved postintervention.  Based on this we will not plan on intervention on the left renal artery currently.  Patient advised to continue with Plavix and aspirin as she is also encouraged to continue to follow with cardiologist in order for evaluation and treatment of hypertension  2. Carotid stenosis, bilateral Most recent duplex on 07/24/2021 was stable with velocities.  We will reevaluate patient in 6 months.   Current Outpatient Medications on File Prior to Visit  Medication Sig Dispense Refill   acetaminophen (TYLENOL) 500 MG tablet Take 500 mg by mouth daily as needed for headache.     ALPRAZolam (XANAX) 0.25 MG tablet Take 0.25 mg by mouth at bedtime as needed for sleep.     ascorbic acid (VITAMIN C) 1000 MG tablet Take 1,000 mg by mouth daily.     aspirin 81 MG tablet Take 81 mg by mouth daily.     atorvastatin (LIPITOR) 10 MG tablet Take 1 tablet (10 mg total) by mouth daily. 30 tablet 4   Cholecalciferol (VITAMIN D3) 1.25 MG (50000 UT) CAPS Take 1 capsule by mouth daily.     cloNIDine (CATAPRES) 0.1 MG tablet Take 0.1 mg by mouth at bedtime.     clopidogrel (PLAVIX) 75 MG tablet Take 1 tablet (75 mg total) by mouth daily. 30 tablet 4   conjugated estrogens (PREMARIN) vaginal cream Apply one pea-sized amount around the opening of the urethra three times weekly. 30 g 3   felodipine (PLENDIL) 2.5 MG 24 hr tablet Take 2.5 mg by mouth daily. May take a second 2.5 mg dose as needed if bp is over 150/90     fexofenadine (ALLEGRA) 180 MG tablet Take 180 mg by mouth daily as needed for allergies.      fluconazole (DIFLUCAN) 150 MG tablet Take 1 tablet (150 mg total) by mouth every 3 (three) days. For three doses  3 tablet 3   gabapentin (NEURONTIN) 100 MG capsule Take 100 mg by mouth 3 (three) times daily.     hydrALAZINE (APRESOLINE) 25 MG tablet Take 25 mg by mouth 3 (three) times daily.     hydrochlorothiazide (HYDRODIURIL) 12.5 MG tablet Take 1 tablet by mouth daily.     isosorbide mononitrate (IMDUR) 60 MG 24 hr tablet Take 60 mg by mouth daily.     losartan (COZAAR) 100 MG tablet Take 100 mg by mouth daily.     metoprolol tartrate (LOPRESSOR) 100 MG tablet Take 100 mg by mouth 2 (two) times daily.     metroNIDAZOLE (METROGEL) 0.75 % vaginal gel Place vaginally at bedtime.     mirabegron ER (MYRBETRIQ) 25 MG TB24 tablet Take 25 mg by mouth daily.     nystatin cream (MYCOSTATIN) Apply 1 Application topically 2 (two) times daily. 30 g 1   ondansetron (ZOFRAN) 4 MG tablet Take 4 mg by mouth every 8 (eight) hours as needed.     predniSONE (DELTASONE) 2.5 MG tablet Take 2.5 mg by mouth daily.     vitamin E 180 MG (400 UNITS) capsule Take 400 Units by mouth daily.     pantoprazole (PROTONIX) 40 MG tablet Take 1 tablet (40 mg  total) by mouth 2 (two) times daily before a meal. 60 tablet 0   No current facility-administered medications on file prior to visit.    There are no Patient Instructions on file for this visit. No follow-ups on file.   Kris Hartmann, NP

## 2022-04-28 IMAGING — CT CT ABD-PELV W/O CM
2 of 4 series · 16 of 46 positions shown, 18 images · non-contrast
Comparison: 11/26/2020

CLINICAL DATA: 87-year-old female with left lower quadrant
abdominal pain.

EXAM:
CT ABDOMEN AND PELVIS WITHOUT CONTRAST
TECHNIQUE: Multidetector CT imaging of the abdomen and pelvis was performed
following the standard protocol without IV contrast.

[Series 2: routine abd/pel wo · axial · 0.81mm/px · z∈[-478,-28]mm · 13 of 100 slices shown, 15 images]
[im 5/100  soft-tissue]
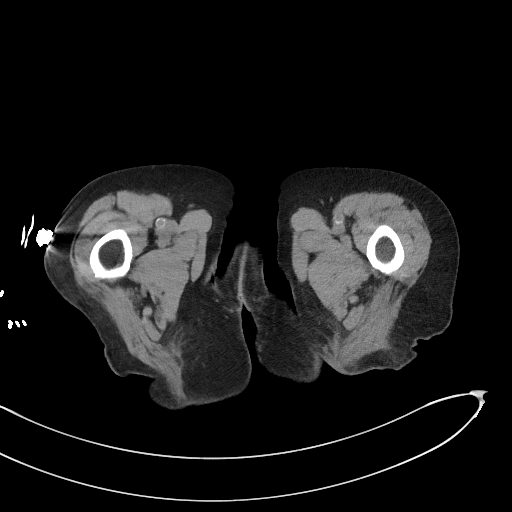
[im 5/100  bone]
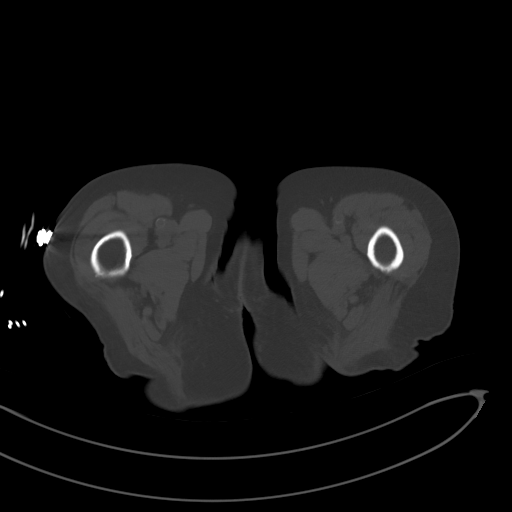
[im 13/100  soft-tissue]
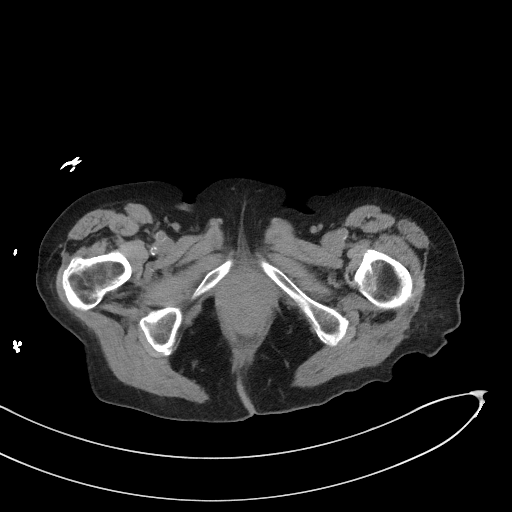
[im 22/100  soft-tissue]
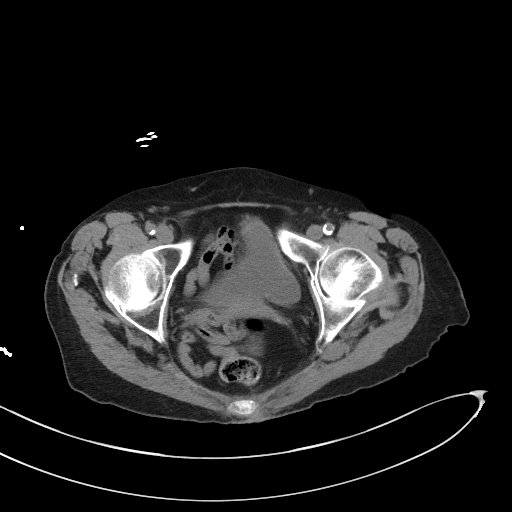
[im 26/100  soft-tissue]
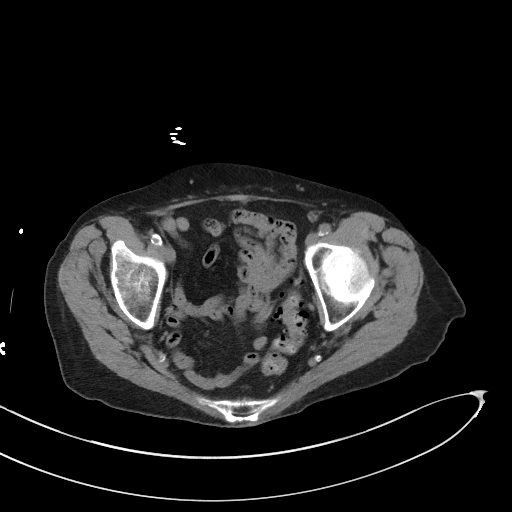
[im 35/100  soft-tissue]
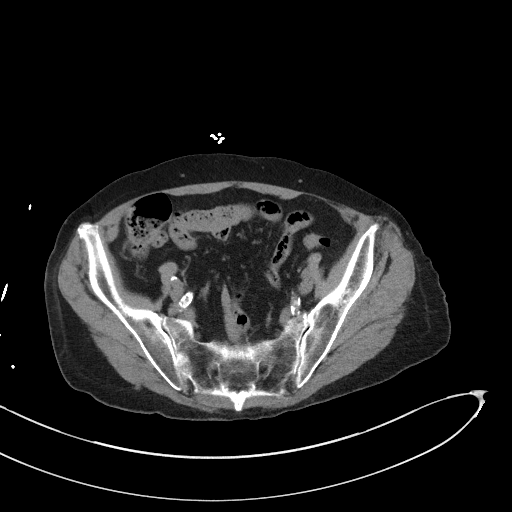
[im 44/100  soft-tissue]
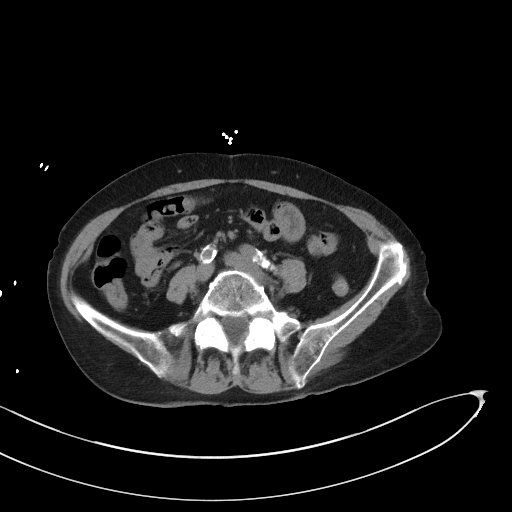
[im 52/100  soft-tissue]
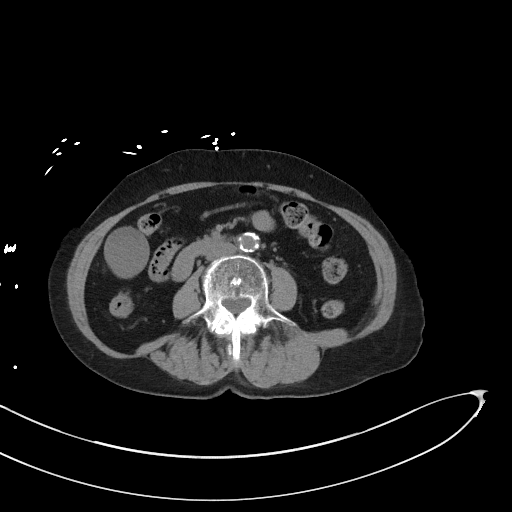
[im 56/100  soft-tissue]
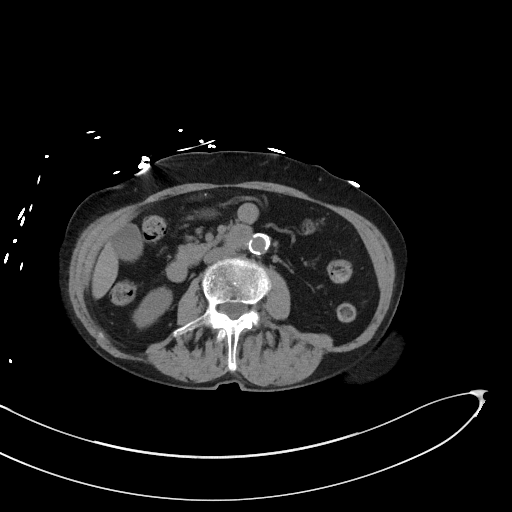
[im 65/100  soft-tissue]
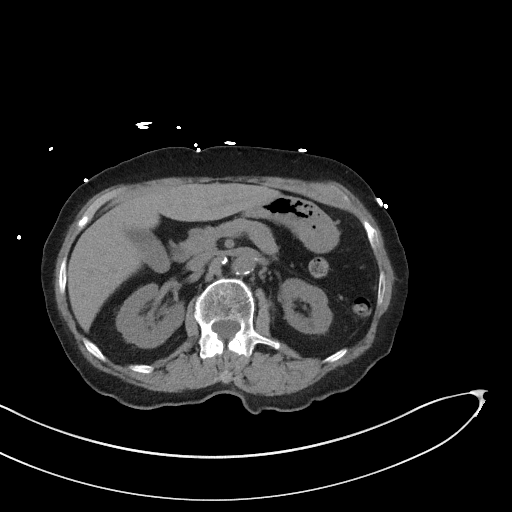
[im 65/100  bone]
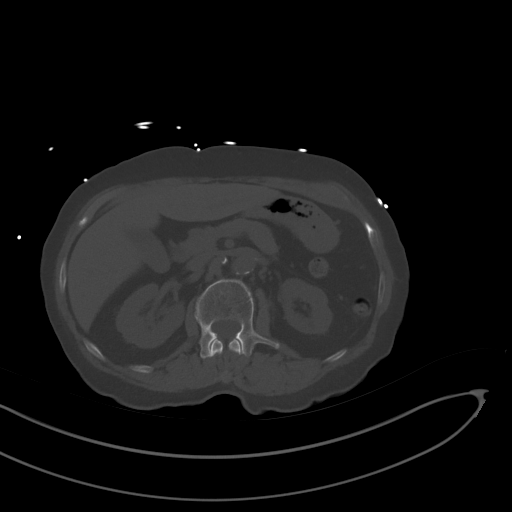
[im 74/100  soft-tissue]
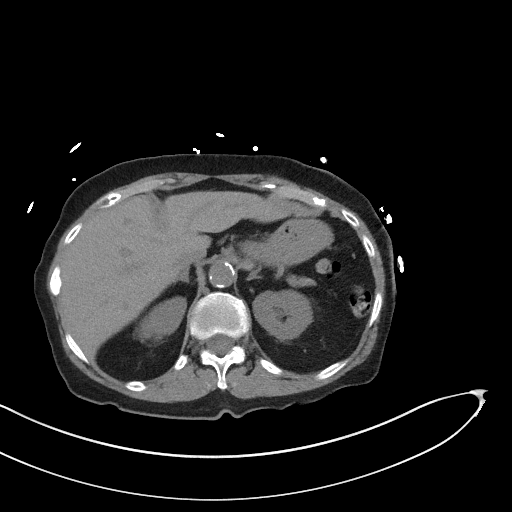
[im 78/100  soft-tissue]
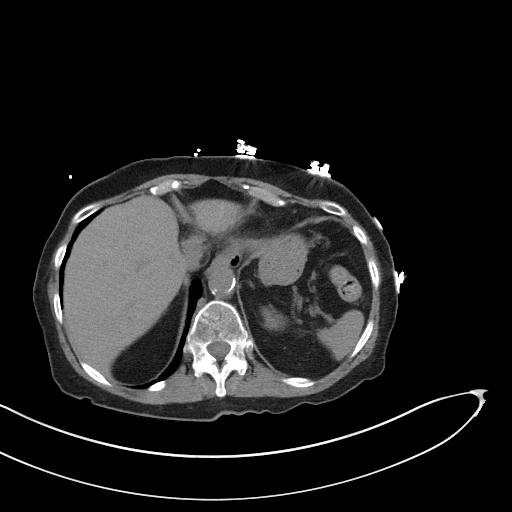
[im 87/100  soft-tissue]
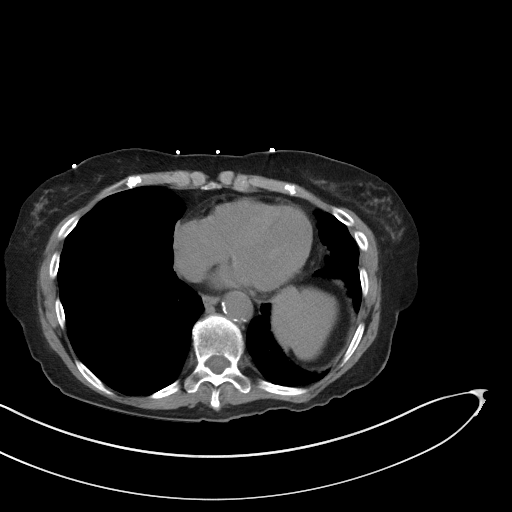
[im 95/100  soft-tissue]
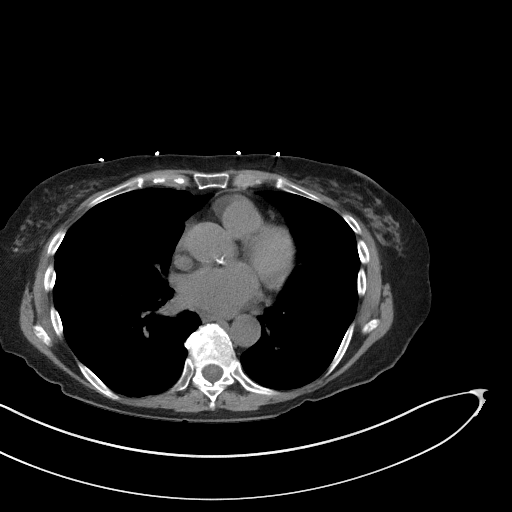

[Series 5: coronal st · coronal · 0.75mm/px · 3 of 75 slices shown]
[im 25/75  soft-tissue]
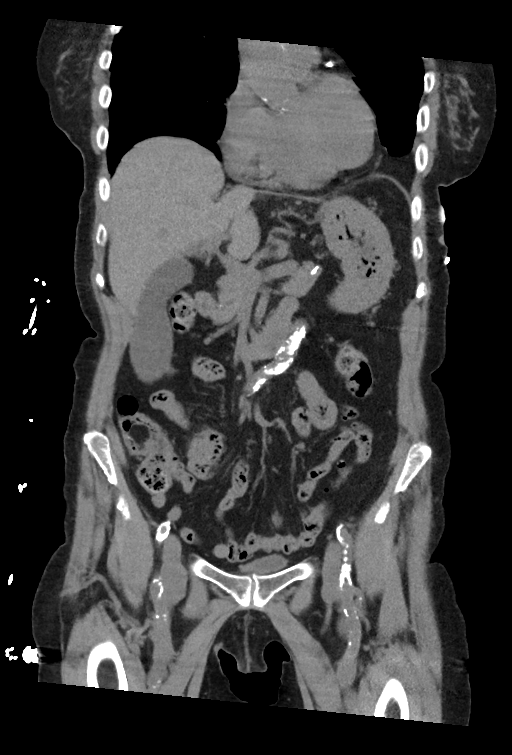
[im 33/75  soft-tissue]
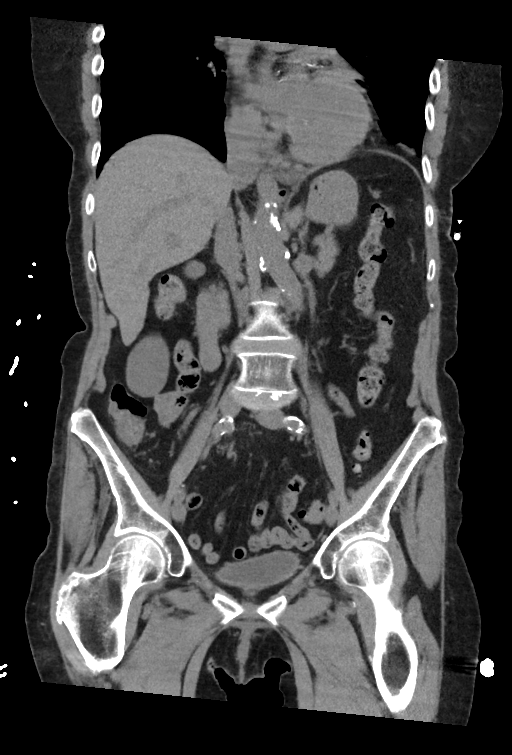
[im 42/75  soft-tissue]
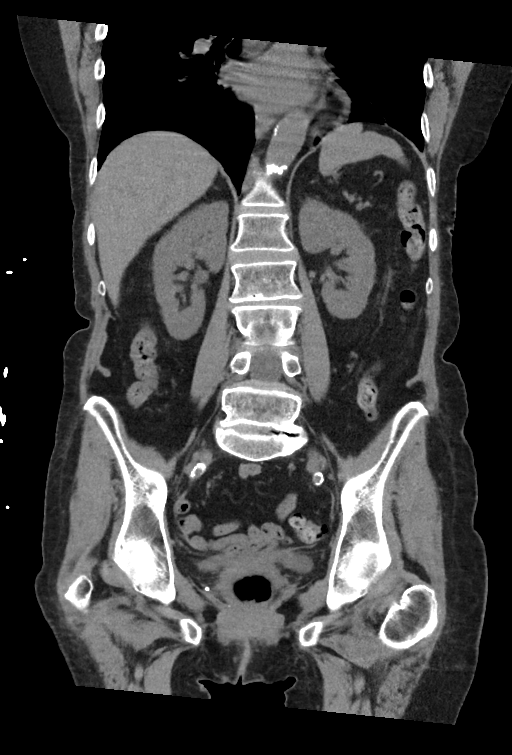

[16 of 46 positions shown; findings below may reference images not displayed]

FINDINGS: Lower chest: No acute abnormality.

Hepatobiliary: No focal liver abnormality is seen. No gallstones,
gallbladder wall thickening, or biliary dilatation.

Pancreas: Unremarkable. No pancreatic ductal dilatation or
surrounding inflammatory changes.

Spleen: Normal in size without focal abnormality.

Adrenals/Urinary Tract: Adrenal glands are unremarkable. Kidneys are
normal, without renal calculi, focal lesion, or hydronephrosis.
Bladder is unremarkable.

Stomach/Bowel: Unchanged small hiatal hernia. Stomach is within
otherwise normal limits. Appendix appears normal. Similar appearing
scattered descending and sigmoid colonic diverticula without
surrounding inflammatory changes. No evidence of bowel wall
thickening, distention, or inflammatory changes.

Vascular/Lymphatic: Aortic atherosclerosis. No enlarged abdominal or
pelvic lymph nodes.

Reproductive: Status post hysterectomy. No adnexal masses.

Other: No abdominal wall hernia or abnormality. No abdominopelvic
ascites.

Musculoskeletal: No acute or significant osseous findings.
IMPRESSION: 1. No acute abnormality in the abdomen or pelvis to explain left
lower quadrant pain.
2. Unchanged descending and sigmoid colonic diverticula, no evidence
of diverticulitis.
3. Unchanged small hiatal hernia.
4.  Aortic Atherosclerosis ([id number]-LHT.T).

## 2022-05-29 ENCOUNTER — Ambulatory Visit
Admission: RE | Admit: 2022-05-29 | Discharge: 2022-05-29 | Disposition: A | Discharge status: Home / Self Care | Payer: Medicare Other | Source: Ambulatory Visit | Attending: Internal Medicine | Admitting: Internal Medicine

## 2022-05-29 DIAGNOSIS — Z1231 Encounter for screening mammogram for malignant neoplasm of breast: Secondary | ICD-10-CM | POA: Insufficient documentation

## 2022-06-03 ENCOUNTER — Encounter (INDEPENDENT_AMBULATORY_CARE_PROVIDER_SITE_OTHER): Payer: Self-pay

## 2022-06-12 ENCOUNTER — Inpatient Hospital Stay: Payer: Medicare Other | Attending: Internal Medicine

## 2022-06-19 ENCOUNTER — Ambulatory Visit: Payer: Medicare Other | Admitting: Internal Medicine

## 2022-06-20 ENCOUNTER — Ambulatory Visit: Payer: Medicare Other | Admitting: Nurse Practitioner

## 2022-07-08 ENCOUNTER — Ambulatory Visit: Payer: Medicare Other | Admitting: Podiatry

## 2022-07-08 ENCOUNTER — Encounter: Payer: Self-pay | Admitting: Podiatry

## 2022-07-08 DIAGNOSIS — G5790 Unspecified mononeuropathy of unspecified lower limb: Secondary | ICD-10-CM

## 2022-07-08 NOTE — Progress Notes (Signed)
Subjective:  Patient ID: Dawn Mcintyre, female    DOB: 1932-08-15,  MRN: [id number] HPI Chief Complaint  Patient presents with   Foot Pain    Neuropathy bilateral - Rx'd gabapentin '100mg'$  TID, but makes sleepy, so takes '200mg'$  QHS-feet still hurt, also 2nd toe right medial side-tender, wears toe spacer   New Patient (Initial Visit)    88 y.o. female presents with the above complaint.   ROS: Denies fever chills nausea vomit muscle aches pains calf pain back pain chest pain shortness of breath.  Past Medical History:  Diagnosis Date   Arteritis ([place])    Arthritis    Atrophic vaginitis 11/16/2014   Carotid artery stenosis    Cataract    Difficult intubation    Environmental and seasonal allergies    GERD (gastroesophageal reflux disease)    Hyperlipidemia    Hypertension    Lack of bladder control    Neuropathy    Osteoporosis    Pneumonia     x 78yr ago   Pre-diabetes    Shingles    Ulcer    Past Surgical History:  Procedure Laterality Date   ABDOMINAL HYSTERECTOMY  1972   BREAST CYST EXCISION Right 1972   BREAST CYST EXCISION Left 1972   ENDARTERECTOMY Left 12/24/2017   Procedure: ENDARTERECTOMY CAROTID;  Surgeon: SKatha Cabal MD;  Location: [facility];  Service: Vascular;  Laterality: Left;   ENDARTERECTOMY Right 03/13/2018   Procedure: ENDARTERECTOMY CAROTID;  Surgeon: SKatha Cabal MD;  Location: [facility];  Service: Vascular;  Laterality: Right;   ESOPHAGOGASTRODUODENOSCOPY (EGD) WITH PROPOFOL N/A 11/28/2020   Procedure: ESOPHAGOGASTRODUODENOSCOPY (EGD) WITH PROPOFOL;  Surgeon: WLucilla Lame MD;  Location: [facility];  Service: Endoscopy;  Laterality: N/A;   EYE SURGERY     RENAL ANGIOGRAPHY N/A 03/05/2022   Procedure: RENAL ANGIOGRAPHY;  Surgeon: SKatha Cabal MD;  [facility];  Service: Cardiovascular;  Laterality: N/A;   THROAT SURGERY     x 10 yrs ago. throat mass.   TONSILLECTOMY      Current Outpatient Medications:     fluticasone (FLONASE) 50 MCG/ACT nasal spray, Place into both nostrils., Disp: , Rfl:    hydrOXYzine (ATARAX) 25 MG tablet, Take 25 mg by mouth daily., Disp: , Rfl:    acetaminophen (TYLENOL) 500 MG tablet, Take 500 mg by mouth daily as needed for headache., Disp: , Rfl:    ALPRAZolam (XANAX) 0.25 MG tablet, Take 0.25 mg by mouth at bedtime as needed for sleep., Disp: , Rfl:    ascorbic acid (VITAMIN C) 1000 MG tablet, Take 1,000 mg by mouth daily., Disp: , Rfl:    aspirin 81 MG tablet, Take 81 mg by mouth daily., Disp: , Rfl:    atorvastatin (LIPITOR) 10 MG tablet, Take 1 tablet (10 mg total) by mouth daily., Disp: 30 tablet, Rfl: 4   Cholecalciferol (VITAMIN D3) 1.25 MG (50000 UT) CAPS, Take 1 capsule by mouth daily., Disp: , Rfl:    cloNIDine (CATAPRES) 0.1 MG tablet, Take 0.1 mg by mouth at bedtime., Disp: , Rfl:    clopidogrel (PLAVIX) 75 MG tablet, Take 1 tablet (75 mg total) by mouth daily., Disp: 30 tablet, Rfl: 4   conjugated estrogens (PREMARIN) vaginal cream, Apply one pea-sized amount around the opening of the urethra three times weekly., Disp: 30 g, Rfl: 3   felodipine (PLENDIL) 2.5 MG 24 hr tablet, Take 2.5 mg by mouth daily. May take a second 2.5 mg dose as needed  if bp is over 150/90, Disp: , Rfl:    fexofenadine (ALLEGRA) 180 MG tablet, Take 180 mg by mouth daily as needed for allergies. , Disp: , Rfl:    fluconazole (DIFLUCAN) 150 MG tablet, Take 1 tablet (150 mg total) by mouth every 3 (three) days. For three doses, Disp: 3 tablet, Rfl: 3   gabapentin (NEURONTIN) 100 MG capsule, Take 100 mg by mouth 3 (three) times daily., Disp: , Rfl:    hydrALAZINE (APRESOLINE) 25 MG tablet, Take 25 mg by mouth 3 (three) times daily., Disp: , Rfl:    hydrochlorothiazide (HYDRODIURIL) 12.5 MG tablet, Take 1 tablet by mouth daily., Disp: , Rfl:    isosorbide mononitrate (IMDUR) 60 MG 24 hr tablet, Take 60 mg by mouth daily., Disp: , Rfl:    losartan (COZAAR) 100 MG tablet, Take 100 mg by mouth  daily., Disp: , Rfl:    metoprolol tartrate (LOPRESSOR) 100 MG tablet, Take 100 mg by mouth 2 (two) times daily., Disp: , Rfl:    metroNIDAZOLE (METROGEL) 0.75 % vaginal gel, Place vaginally at bedtime., Disp: , Rfl:    mirabegron ER (MYRBETRIQ) 25 MG TB24 tablet, Take 25 mg by mouth daily., Disp: , Rfl:    nystatin cream (MYCOSTATIN), Apply 1 Application topically 2 (two) times daily., Disp: 30 g, Rfl: 1   ondansetron (ZOFRAN) 4 MG tablet, Take 4 mg by mouth every 8 (eight) hours as needed., Disp: , Rfl:    pantoprazole (PROTONIX) 40 MG tablet, Take 1 tablet (40 mg total) by mouth 2 (two) times daily before a meal., Disp: 60 tablet, Rfl: 0   predniSONE (DELTASONE) 2.5 MG tablet, Take 2.5 mg by mouth daily., Disp: , Rfl:    vitamin E 180 MG (400 UNITS) capsule, Take 400 Units by mouth daily., Disp: , Rfl:   Allergies  Allergen Reactions   Codeine Hives   Hydrocodone Nausea Only   Metronidazole Nausea Only   Propoxyphene Nausea Only   Sulfa Antibiotics Hives   Amoxicillin-Pot Clavulanate Diarrhea   Latex Rash   Review of Systems Objective:  There were no vitals filed for this visit.  General: Well developed, nourished, in no acute distress, alert and oriented x3   Dermatological: Skin is warm, dry and supple bilateral. Nails x 10 are well maintained; remaining integument appears unremarkable at this time. There are no open sores, no preulcerative lesions, no rash or signs of infection present.  Vascular: Dorsalis Pedis artery and Posterior Tibial artery pedal pulses are 2/4 bilateral with immedate capillary fill time. Pedal hair growth present. No varicosities and no lower extremity edema present bilateral.   Neruologic: Grossly intact via light touch bilateral. Vibratory intact via tuning fork bilateral. Protective threshold with Semmes Wienstein monofilament intact to all pedal sites bilateral. Patellar and Achilles deep tendon reflexes 2+ bilateral. No Babinski or clonus noted  bilateral.   Musculoskeletal: No gross boney pedal deformities bilateral. No pain, crepitus, or limitation noted with foot and ankle range of motion bilateral. Muscular strength 5/5 in all groups tested bilateral.  Mallet toe second digit right foot with mild hallux valgus Heberden's nodes are noted to the dorsal medial dorsal lateral aspect of the DIPJ second digit right  Gait: Unassisted, Nonantalgic.    Radiographs:  None taken  Assessment & Plan:   Assessment: Peripheral neuropathy borderline diabetic.  Hammertoe second right  Plan: Discussed etiology pathology conservative versus surgical therapies.  At this point we did discuss padding of the second toe versus surgery.  We  did discuss the use of her gabapentin she would like to try to increase the gabapentin.  She states that it may make her too dizzy to take though.  I recommended that she increase from 100 mg tablets 3 times a day to 100 in the morning 100 at lunch and 200 at bedtime.  Should this be effective for her we will consider filling her prescription as this.     Tayten Heber T. [place], [place]

## 2022-07-11 ENCOUNTER — Other Ambulatory Visit (INDEPENDENT_AMBULATORY_CARE_PROVIDER_SITE_OTHER): Payer: Self-pay | Admitting: Nurse Practitioner

## 2022-07-11 DIAGNOSIS — I701 Atherosclerosis of renal artery: Secondary | ICD-10-CM

## 2022-07-11 DIAGNOSIS — Z959 Presence of cardiac and vascular implant and graft, unspecified: Secondary | ICD-10-CM

## 2022-07-15 ENCOUNTER — Ambulatory Visit (INDEPENDENT_AMBULATORY_CARE_PROVIDER_SITE_OTHER): Payer: Medicare Other | Admitting: Nurse Practitioner

## 2022-07-15 ENCOUNTER — Encounter (INDEPENDENT_AMBULATORY_CARE_PROVIDER_SITE_OTHER): Payer: Self-pay | Admitting: Nurse Practitioner

## 2022-07-15 ENCOUNTER — Ambulatory Visit (INDEPENDENT_AMBULATORY_CARE_PROVIDER_SITE_OTHER): Payer: Medicare Other

## 2022-07-15 VITALS — BP 137/65 | HR 62 | Resp 16 | Wt 141.6 lb

## 2022-07-15 DIAGNOSIS — I6523 Occlusion and stenosis of bilateral carotid arteries: Secondary | ICD-10-CM | POA: Diagnosis not present

## 2022-07-15 DIAGNOSIS — Z959 Presence of cardiac and vascular implant and graft, unspecified: Secondary | ICD-10-CM | POA: Diagnosis not present

## 2022-07-15 DIAGNOSIS — E1122 Type 2 diabetes mellitus with diabetic chronic kidney disease: Secondary | ICD-10-CM

## 2022-07-15 DIAGNOSIS — I701 Atherosclerosis of renal artery: Secondary | ICD-10-CM | POA: Diagnosis not present

## 2022-07-15 DIAGNOSIS — I10 Essential (primary) hypertension: Secondary | ICD-10-CM

## 2022-07-15 DIAGNOSIS — N1831 Chronic kidney disease, stage 3a: Secondary | ICD-10-CM

## 2022-07-22 ENCOUNTER — Ambulatory Visit (INDEPENDENT_AMBULATORY_CARE_PROVIDER_SITE_OTHER): Payer: Medicare Other | Admitting: Vascular Surgery

## 2022-07-22 ENCOUNTER — Encounter (INDEPENDENT_AMBULATORY_CARE_PROVIDER_SITE_OTHER): Payer: Medicare Other

## 2022-07-30 ENCOUNTER — Encounter (INDEPENDENT_AMBULATORY_CARE_PROVIDER_SITE_OTHER): Payer: Self-pay | Admitting: Nurse Practitioner

## 2022-07-30 NOTE — Progress Notes (Signed)
Subjective:    Patient ID: Dawn Mcintyre, female    DOB: January 23, 1933, 88 y.o.   MRN: [id number] Chief Complaint  Patient presents with   Follow-up    Ultrasound follow up    Dawn Mcintyre is a 88 year old female that returns today following right renal artery stent placement on 03/05/2022.  It is noted that the patient's blood pressure is elevated currently but she notes that she has not had any medication this morning.  The patient notes that typically when she takes her medication her blood pressure typically ranges in the 601 systolic range.  She notes that in fact she has been having some recent low blood pressures as low as 99 systolic.  Previously, she notes that since the stent placement her blood pressure has been getting lower than before she has had to adjust her medications to ensure she does not have hypotensive episodes.  Today, she notes that she has been having more elevated blood pressures.  But she notes that she continues to self adjust her medications.  She is planning on visiting her cardiologist soon for adjustment of her medications.  It was also noted that during angiogram there was a distal 70% stenosis of the left renal artery today the patient has a patent right stent with no evidence of significant stenosis.  The left has a 1 to 59% stenosis.  The patient's bilateral internal carotid arteries also have no evidence of significant stenosis post bilateral carotid endarterectomies.       Review of Systems  All other systems reviewed and are negative.      Objective:   Physical Exam Vitals reviewed.  HENT:     Head: Normocephalic.  Cardiovascular:     Rate and Rhythm: Normal rate.     Pulses: Normal pulses.  Pulmonary:     Effort: Pulmonary effort is normal.  Skin:    General: Skin is warm and dry.  Neurological:     Mental Status: She is alert and oriented to person, place, and time.  Psychiatric:        Mood and Affect: Mood normal.        Behavior:  Behavior normal.        Thought Content: Thought content normal.        Judgment: Judgment normal.     BP 137/65 (BP Location: Left Arm)   Pulse 62   Resp 16   Wt 141 lb 9.6 oz (64.2 kg)   BMI 22.18 kg/m   Past Medical History:  Diagnosis Date   Arteritis ([place])    Arthritis    Atrophic vaginitis 11/16/2014   Carotid artery stenosis    Cataract    Difficult intubation    Environmental and seasonal allergies    GERD (gastroesophageal reflux disease)    Hyperlipidemia    Hypertension    Lack of bladder control    Neuropathy    Osteoporosis    Pneumonia     x 29yr ago   Pre-diabetes    Shingles    Ulcer     Social History   Socioeconomic History   Marital status: Widowed    Spouse name: Not on file   Number of children: Not on file   Years of education: Not on file   Highest education level: Not on file  Occupational History   Not on file  Tobacco Use   Smoking status: Never    Passive exposure: Never   Smokeless tobacco: Never  Vaping Use  Vaping Use: Never used  Substance and Sexual Activity   Alcohol use: No    Alcohol/week: 0.0 standard drinks of alcohol   Drug use: No   Sexual activity: Not Currently  Other Topics Concern   Not on file  Social History Narrative   Not on file   Social Determinants of Health   Financial Resource Strain: Not on file  Food Insecurity: Not on file  Transportation Needs: Not on file  Physical Activity: Not on file  Stress: Not on file  Social Connections: Not on file  Intimate Partner Violence: Not on file    Past Surgical History:  Procedure Laterality Date   [place] Left 12/24/2017   Procedure: ENDARTERECTOMY CAROTID;  Surgeon: Katha Cabal, MD;  Location: [facility];  Service: Vascular;  Laterality: Left;   ENDARTERECTOMY Right 03/13/2018   Procedure: ENDARTERECTOMY CAROTID;  Surgeon: Katha Cabal, MD;  Location: [facility];  Service: Vascular;  Laterality: Right;   ESOPHAGOGASTRODUODENOSCOPY (EGD) WITH PROPOFOL N/A 11/28/2020   Procedure: ESOPHAGOGASTRODUODENOSCOPY (EGD) WITH PROPOFOL;  Surgeon: Lucilla Lame, MD;  Location: [facility];  Service: Endoscopy;  Laterality: N/A;   EYE SURGERY     RENAL ANGIOGRAPHY N/A 03/05/2022   Procedure: RENAL ANGIOGRAPHY;  Surgeon: Katha Cabal, MD;  Location: [facility];  Service: Cardiovascular;  Laterality: N/A;   THROAT SURGERY     x 10 yrs ago. throat mass.   TONSILLECTOMY      Family History  Problem Relation Age of Onset   Anuerysm Mother    Prostate cancer Father    Esophageal cancer Sister    Lung cancer Sister    Diabetes Brother    Hearing loss Brother    Bladder Cancer Neg Hx    Kidney cancer Neg Hx    Breast cancer Neg Hx     Allergies  Allergen Reactions   Codeine Hives   Hydrocodone Nausea Only   Metronidazole Nausea Only   Propoxyphene Nausea Only   Sulfa Antibiotics Hives   Amoxicillin-Pot Clavulanate Diarrhea   Latex Rash       Latest Ref Rng & Units 01/07/2022   10:46 AM 12/29/2021   12:36 PM 12/19/2021    2:27 PM  CBC  WBC 4.0 - 10.5 K/uL 7.2  8.1  8.8   Hemoglobin 12.0 - 15.0 g/dL 11.7  11.6  11.4   Hematocrit 36.0 - 46.0 % 36.7  36.8  36.4   Platelets 150 - 400 K/uL 200  198  218       CMP     Component Value Date/Time   NA 138 01/07/2022 1046   NA 139 09/12/2011 0250   K 3.4 (L) 01/07/2022 1046   K 3.5 09/12/2011 0250   CL 103 01/07/2022 1046   CL 102 09/12/2011 0250   CO2 27 01/07/2022 1046   CO2 23 09/12/2011 0250   GLUCOSE 128 (H) 01/07/2022 1046   GLUCOSE 162 (H) 09/12/2011 0250   BUN 14 03/05/2022 0725   BUN 20 05/30/2016 1358   BUN 23 (H) 09/12/2011 0250   CREATININE 0.90 03/05/2022 0725   CREATININE 0.94 09/12/2011 0250   CALCIUM 10.1 01/07/2022 1046   CALCIUM 9.1 09/12/2011 0250   PROT 6.6 01/07/2022 1046   PROT 7.2 09/12/2011 0250   ALBUMIN 3.7 01/07/2022 1046    ALBUMIN 3.6 09/12/2011 0250   AST 20  01/07/2022 1046   AST 21 09/12/2011 0250   ALT 10 01/07/2022 1046   ALT 14 09/12/2011 0250   ALKPHOS 69 01/07/2022 1046   ALKPHOS 58 09/12/2011 0250   BILITOT 0.7 01/07/2022 1046   BILITOT 0.5 09/12/2011 0250   GFRNONAA >60 03/05/2022 0725   GFRNONAA >60 09/12/2011 0250   GFRAA 55 (L) 10/04/2019 1828   GFRAA >60 09/12/2011 0250     No results found.     Assessment & Plan:   1. Renal artery stenosis (HCC) Renal artery stenosis today appears stable.  The right stent is patent with no evidence of significant stenosis in the left kidney.  Will have the patient follow-up 3 months as noted below.  2. Essential hypertension The patient notes that her blood pressures have been somewhat not as well-controlled despite recent intervention.  However based on the patient's description it is noted that she is not taking her blood pressure medications consistently as prescribed.  The patient is taking some medications some days and then difference and on other days.  She will be seeing her cardiologist soon.  I have discussed with her that she should discuss with him about finding a consistent regimen of blood pressure medications as the stopping and starting the medications may also be what is affecting her blood pressure.  Based on this we will not plan any further intervention at this time but will follow-up in 3 months.  3. Type 2 diabetes mellitus with stage 3a chronic kidney disease, without long-term current use of insulin (HCC) Continue hypoglycemic medications as already ordered, these medications have been reviewed and there are no changes at this time.  Hgb A1C to be monitored as already arranged by primary service  4. Carotid stenosis, bilateral Recommend:  Given the patient's asymptomatic subcritical stenosis no further invasive testing or surgery at this time.  Duplex ultrasound shows widely patent carotids post bilateral carotid  endarterectomies  Continue antiplatelet therapy as prescribed Continue management of CAD, HTN and Hyperlipidemia Healthy heart diet,  encouraged exercise at least 4 times per week Follow up in 12 months with duplex ultrasound and physical exam     Current Outpatient Medications on File Prior to Visit  Medication Sig Dispense Refill   acetaminophen (TYLENOL) 500 MG tablet Take 500 mg by mouth daily as needed for headache.     ALPRAZolam (XANAX) 0.25 MG tablet Take 0.25 mg by mouth at bedtime as needed for sleep.     ascorbic acid (VITAMIN C) 1000 MG tablet Take 1,000 mg by mouth daily.     aspirin 81 MG tablet Take 81 mg by mouth daily.     atorvastatin (LIPITOR) 10 MG tablet Take 1 tablet (10 mg total) by mouth daily. 30 tablet 4   Cholecalciferol (VITAMIN D3) 1.25 MG (50000 UT) CAPS Take 1 capsule by mouth daily.     cloNIDine (CATAPRES) 0.1 MG tablet Take 0.1 mg by mouth at bedtime.     clopidogrel (PLAVIX) 75 MG tablet Take 1 tablet (75 mg total) by mouth daily. 30 tablet 4   conjugated estrogens (PREMARIN) vaginal cream Apply one pea-sized amount around the opening of the urethra three times weekly. 30 g 3   felodipine (PLENDIL) 2.5 MG 24 hr tablet Take 2.5 mg by mouth daily. May take a second 2.5 mg dose as needed if bp is over 150/90     fexofenadine (ALLEGRA) 180 MG tablet Take 180 mg by mouth daily as needed for allergies.  fluconazole (DIFLUCAN) 150 MG tablet Take 1 tablet (150 mg total) by mouth every 3 (three) days. For three doses 3 tablet 3   fluticasone (FLONASE) 50 MCG/ACT nasal spray Place into both nostrils.     gabapentin (NEURONTIN) 100 MG capsule Take 100 mg by mouth 3 (three) times daily.     hydrALAZINE (APRESOLINE) 25 MG tablet Take 25 mg by mouth 3 (three) times daily.     hydrochlorothiazide (HYDRODIURIL) 12.5 MG tablet Take 1 tablet by mouth daily.     hydrOXYzine (ATARAX) 25 MG tablet Take 25 mg by mouth daily.     isosorbide mononitrate (IMDUR) 60 MG 24 hr  tablet Take 60 mg by mouth daily.     losartan (COZAAR) 100 MG tablet Take 100 mg by mouth daily.     metoprolol tartrate (LOPRESSOR) 100 MG tablet Take 100 mg by mouth 2 (two) times daily.     metroNIDAZOLE (METROGEL) 0.75 % vaginal gel Place vaginally at bedtime.     mirabegron ER (MYRBETRIQ) 25 MG TB24 tablet Take 25 mg by mouth daily.     nystatin cream (MYCOSTATIN) Apply 1 Application topically 2 (two) times daily. 30 g 1   ondansetron (ZOFRAN) 4 MG tablet Take 4 mg by mouth every 8 (eight) hours as needed.     predniSONE (DELTASONE) 2.5 MG tablet Take 2.5 mg by mouth daily.     vitamin E 180 MG (400 UNITS) capsule Take 400 Units by mouth daily.     pantoprazole (PROTONIX) 40 MG tablet Take 1 tablet (40 mg total) by mouth 2 (two) times daily before a meal. 60 tablet 0   No current facility-administered medications on file prior to visit.    There are no Patient Instructions on file for this visit. No follow-ups on file.   Kris Hartmann, NP

## 2022-08-04 ENCOUNTER — Other Ambulatory Visit (INDEPENDENT_AMBULATORY_CARE_PROVIDER_SITE_OTHER): Payer: Self-pay | Admitting: Vascular Surgery

## 2022-09-16 ENCOUNTER — Ambulatory Visit: Payer: Medicare Other | Admitting: Urology

## 2022-09-16 VITALS — BP 173/71 | HR 67 | Wt 140.0 lb

## 2022-09-16 DIAGNOSIS — N3020 Other chronic cystitis without hematuria: Secondary | ICD-10-CM

## 2022-09-16 DIAGNOSIS — R350 Frequency of micturition: Secondary | ICD-10-CM

## 2022-09-16 LAB — URINALYSIS, COMPLETE
Bilirubin, UA: NEGATIVE
Glucose, UA: NEGATIVE
Ketones, UA: NEGATIVE
Nitrite, UA: POSITIVE — AB
RBC, UA: NEGATIVE
Specific Gravity, UA: 1.02 (ref 1.005–1.030)
Urobilinogen, Ur: 0.2 mg/dL (ref 0.2–1.0)
pH, UA: 5.5 (ref 5.0–7.5)

## 2022-09-16 LAB — MICROSCOPIC EXAMINATION: WBC, UA: >30 /hpf — AB (ref 0–5)

## 2022-09-16 MED ORDER — NITROFURANTOIN MONOHYD MACRO 100 MG PO CAPS: 100.0000 mg | ORAL_CAPSULE | Freq: Every day | ORAL | 11 refills | Status: DC

## 2022-09-16 MED ORDER — NITROFURANTOIN MONOHYD MACRO 100 MG PO CAPS: 100.0000 mg | ORAL_CAPSULE | Freq: Two times a day (BID) | ORAL | 0 refills | Status: DC

## 2022-09-16 NOTE — Progress Notes (Signed)
09/16/2022 1:39 PM   Makynli Vasicek Doffing 09/03/1932 CC:[id number]  Referring provider: Idelle Crouch, MD [facility] [place],  [place] [address]  No chief complaint on file.   HPI: Multiple providers-recurrent UTIs 2020 and had a negative CT scan  Today Patient has a bladder infection every 2 months with abdominal pain frequency and foul-smelling urine that respond favorably to antibiotics.  She thinks she might be infected today because she has a little bit of terminal burning  At baseline she voids every 2 hours gets up least 4 times a day.  No ankle edema.  She is continent  She describes an arterial procedure on her renal artery that helped her hypertension with a stent in the last many months.  Kidneys were otherwise healthy January 17, 2022.  She had positive cultures in the medical record   PMH: Past Medical History:  Diagnosis Date   Arteritis ([place])    Arthritis    Atrophic vaginitis 11/16/2014   Carotid artery stenosis    Cataract    Difficult intubation    Environmental and seasonal allergies    GERD (gastroesophageal reflux disease)    Hyperlipidemia    Hypertension    Lack of bladder control    Neuropathy    Osteoporosis    Pneumonia     x 54yr ago   Pre-diabetes    Shingles    Ulcer     Surgical History: Past Surgical History:  Procedure Laterality Date   ABDOMINAL HYSTERECTOMY  1972   BREAST CYST EXCISION Right 1972   BREAST CYST EXCISION Left 1972   ENDARTERECTOMY Left 12/24/2017   Procedure: ENDARTERECTOMY CAROTID;  Surgeon: SKatha Cabal MD;  Location: [facility] ORS;  Service: Vascular;  Laterality: Left;   ENDARTERECTOMY Right 03/13/2018   Procedure: ENDARTERECTOMY CAROTID;  Surgeon: SKatha Cabal MD;  Location: [facility] ORS;  Service: Vascular;  Laterality: Right;   ESOPHAGOGASTRODUODENOSCOPY (EGD) WITH PROPOFOL N/A 11/28/2020   Procedure: ESOPHAGOGASTRODUODENOSCOPY (EGD) WITH PROPOFOL;  Surgeon: WLucilla Lame MD;   Location: [facility] ENDOSCOPY;  Service: Endoscopy;  Laterality: N/A;   EYE SURGERY     RENAL ANGIOGRAPHY N/A 03/05/2022   Procedure: RENAL ANGIOGRAPHY;  Surgeon: SKatha Cabal MD;  [facility];  Service: Cardiovascular;  Laterality: N/A;   THROAT SURGERY     x 10 yrs ago. throat mass.   TONSILLECTOMY      Home Medications:  Allergies as of 09/16/2022       Reactions   Codeine Hives   Hydrocodone Nausea Only   Metronidazole Nausea Only   Propoxyphene Nausea Only   Sulfa Antibiotics Hives   Amoxicillin-pot Clavulanate Diarrhea   Latex Rash        Medication List        Accurate as of September 16, 2022  1:39 PM. If you have any questions, ask your nurse or doctor.          acetaminophen 500 MG tablet Commonly known as: TYLENOL Take 500 mg by mouth daily as needed for headache.   ALPRAZolam 0.25 MG tablet Commonly known as: XANAX Take 0.25 mg by mouth at bedtime as needed for sleep.   ascorbic acid 1000 MG tablet Commonly known as: VITAMIN C Take 1,000 mg by mouth daily.   aspirin 81 MG tablet Take 81 mg by mouth daily.   atorvastatin 10 MG tablet Commonly known as: Lipitor Take 1 tablet (10 mg total) by mouth daily.   cloNIDine  0.1 MG tablet Commonly known as: CATAPRES Take 0.1 mg by mouth at bedtime.   clopidogrel 75 MG tablet Commonly known as: PLAVIX TAKE 1 TABLET BY MOUTH EVERY DAY   felodipine 2.5 MG 24 hr tablet Commonly known as: PLENDIL Take 2.5 mg by mouth daily. May take a second 2.5 mg dose as needed if bp is over 150/90   fexofenadine 180 MG tablet Commonly known as: ALLEGRA Take 180 mg by mouth daily as needed for allergies.   fluconazole 150 MG tablet Commonly known as: DIFLUCAN Take 1 tablet (150 mg total) by mouth every 3 (three) days. For three doses   fluticasone 50 MCG/ACT nasal spray Commonly known as: FLONASE Place into both nostrils.   gabapentin 100 MG capsule Commonly known as: NEURONTIN Take 100 mg  by mouth 3 (three) times daily.   hydrALAZINE 25 MG tablet Commonly known as: APRESOLINE Take 25 mg by mouth 3 (three) times daily.   hydrochlorothiazide 12.5 MG tablet Commonly known as: HYDRODIURIL Take 1 tablet by mouth daily.   hydrOXYzine 25 MG tablet Commonly known as: ATARAX Take 25 mg by mouth daily.   isosorbide mononitrate 60 MG 24 hr tablet Commonly known as: IMDUR Take 60 mg by mouth daily.   losartan 100 MG tablet Commonly known as: COZAAR Take 100 mg by mouth daily.   metoprolol tartrate 100 MG tablet Commonly known as: LOPRESSOR Take 100 mg by mouth 2 (two) times daily.   metroNIDAZOLE 0.75 % vaginal gel Commonly known as: METROGEL Place vaginally at bedtime.   mirabegron ER 25 MG Tb24 tablet Commonly known as: MYRBETRIQ Take 25 mg by mouth daily.   nystatin cream Commonly known as: MYCOSTATIN Apply 1 Application topically 2 (two) times daily.   ondansetron 4 MG tablet Commonly known as: ZOFRAN Take 4 mg by mouth every 8 (eight) hours as needed.   pantoprazole 40 MG tablet Commonly known as: Protonix Take 1 tablet (40 mg total) by mouth 2 (two) times daily before a meal.   predniSONE 2.5 MG tablet Commonly known as: DELTASONE Take 2.5 mg by mouth daily.   Premarin vaginal cream Generic drug: conjugated estrogens Apply one pea-sized amount around the opening of the urethra three times weekly.   Vitamin D3 1.25 MG (50000 UT) Caps Take 1 capsule by mouth daily.   vitamin E 180 MG (400 UNITS) capsule Take 400 Units by mouth daily.        Allergies:  Allergies  Allergen Reactions   Codeine Hives   Hydrocodone Nausea Only   Metronidazole Nausea Only   Propoxyphene Nausea Only   Sulfa Antibiotics Hives   Amoxicillin-Pot Clavulanate Diarrhea   Latex Rash    Family History: Family History  Problem Relation Age of Onset   Anuerysm Mother    Prostate cancer Father    Esophageal cancer Sister    Lung cancer Sister    Diabetes  Brother    Hearing loss Brother    Bladder Cancer Neg Hx    Kidney cancer Neg Hx    Breast cancer Neg Hx     Social History:  reports that she has never smoked. She has never been exposed to tobacco smoke. She has never used smokeless tobacco. She reports that she does not drink alcohol and does not use drugs.  ROS:  Physical Exam: There were no vitals taken for this visit.  Constitutional:  Alert and oriented, No acute distress. HEENT: [place] AT, moist mucus membranes.  Trachea midline, no masses.  Laboratory Data: Lab Results  Component Value Date   WBC 7.2 01/07/2022   HGB 11.7 (L) 01/07/2022   HCT 36.7 01/07/2022   MCV 90.8 01/07/2022   PLT 200 01/07/2022    Lab Results  Component Value Date   CREATININE 0.90 03/05/2022    No results found for: "PSA"  No results found for: "TESTOSTERONE"  Lab Results  Component Value Date   HGBA1C 6.0 (H) 10/28/2021    Urinalysis    Component Value Date/Time   COLORURINE YELLOW (A) 12/19/2021 1427   APPEARANCEUR HAZY (A) 12/19/2021 1427   APPEARANCEUR Cloudy (A) 05/18/2019 1404   LABSPEC 1.019 12/19/2021 1427   LABSPEC 1.026 09/12/2011 2304   PHURINE 5.0 12/19/2021 1427   GLUCOSEU NEGATIVE 12/19/2021 1427   GLUCOSEU Negative 09/12/2011 2304   HGBUR NEGATIVE 12/19/2021 1427   BILIRUBINUR NEGATIVE 12/19/2021 1427   BILIRUBINUR neg 04/12/2021 1144   BILIRUBINUR Negative 05/18/2019 1404   BILIRUBINUR Negative 09/12/2011 2304   KETONESUR NEGATIVE 12/19/2021 1427   PROTEINUR 30 (A) 12/19/2021 1427   UROBILINOGEN 0.2 04/12/2021 1144   NITRITE NEGATIVE 12/19/2021 1427   LEUKOCYTESUR SMALL (A) 12/19/2021 1427   LEUKOCYTESUR 2+ 09/12/2011 2304    Pertinent Imaging: Urine reviewed.  Urine sent for culture.  Chart reviewed  Assessment & Plan: Has recurrent bladder infections.  Pathophysiology described.  Based upon last positive culture and allergies I called in  Macrodantin 100 mg 3 twice a day for 7 days.  Then I did put her on daily Macrodantin 100 mg 30 x 11.d  Return in 8 weeks for cystoscopy.  She gets hives with sulfa drugs.  She gets diarrhea with Augmentin  1. Recurrent UTI  - Urinalysis, Complete   No follow-ups on file.  Reece Packer, MD  [facility] [address] [place], [place] [address] [phone]

## 2022-09-21 LAB — CULTURE, URINE COMPREHENSIVE

## 2022-09-25 ENCOUNTER — Telehealth (INDEPENDENT_AMBULATORY_CARE_PROVIDER_SITE_OTHER): Payer: Self-pay | Admitting: Nurse Practitioner

## 2022-09-25 NOTE — Telephone Encounter (Signed)
Patient was needing refill for cholesterol medication. Patient was recommended to have pharmacy contact her PCP for medication refill

## 2022-09-25 NOTE — Telephone Encounter (Signed)
Patient called and LVM stating stating she needs a refill on her medication.  She did not specify which medication.  Please advise.

## 2022-10-03 ENCOUNTER — Inpatient Hospital Stay: Payer: Medicare Other | Attending: Internal Medicine

## 2022-10-03 DIAGNOSIS — N183 Chronic kidney disease, stage 3 unspecified: Secondary | ICD-10-CM | POA: Diagnosis not present

## 2022-10-03 DIAGNOSIS — D472 Monoclonal gammopathy: Secondary | ICD-10-CM | POA: Insufficient documentation

## 2022-10-03 DIAGNOSIS — D631 Anemia in chronic kidney disease: Secondary | ICD-10-CM | POA: Diagnosis not present

## 2022-10-03 LAB — CBC WITH DIFFERENTIAL/PLATELET
Abs Immature Granulocytes: 0.03 10*3/uL (ref 0.00–0.07)
Basophils Absolute: 0 10*3/uL (ref 0.0–0.1)
Basophils Relative: 1 %
Eosinophils Absolute: 0.1 10*3/uL (ref 0.0–0.5)
Eosinophils Relative: 2 %
HCT: 35.7 % — ABNORMAL LOW (ref 36.0–46.0)
Hemoglobin: 11.6 g/dL — ABNORMAL LOW (ref 12.0–15.0)
Immature Granulocytes: 1 %
Lymphocytes Relative: 38 %
Lymphs Abs: 2.2 10*3/uL (ref 0.7–4.0)
MCH: 30.9 pg (ref 26.0–34.0)
MCHC: 32.5 g/dL (ref 30.0–36.0)
MCV: 95.2 fL (ref 80.0–100.0)
Monocytes Absolute: 0.6 10*3/uL (ref 0.1–1.0)
Monocytes Relative: 11 %
Neutro Abs: 2.7 10*3/uL (ref 1.7–7.7)
Neutrophils Relative %: 47 %
Platelets: 199 10*3/uL (ref 150–400)
RBC: 3.75 MIL/uL — ABNORMAL LOW (ref 3.87–5.11)
RDW: 12.7 % (ref 11.5–15.5)
WBC: 5.6 10*3/uL (ref 4.0–10.5)
nRBC: 0 % (ref 0.0–0.2)

## 2022-10-03 LAB — COMPREHENSIVE METABOLIC PANEL
ALT: 11 U/L (ref 0–44)
AST: 17 U/L (ref 15–41)
Albumin: 3.7 g/dL (ref 3.5–5.0)
Alkaline Phosphatase: 62 U/L (ref 38–126)
Anion gap: 9 (ref 5–15)
BUN: 16 mg/dL (ref 8–23)
CO2: 27 mmol/L (ref 22–32)
Calcium: 9.8 mg/dL (ref 8.9–10.3)
Chloride: 104 mmol/L (ref 98–111)
Creatinine, Ser: 0.89 mg/dL (ref 0.44–1.00)
GFR, Estimated: >60 mL/min (ref >60)
Glucose, Bld: 99 mg/dL (ref 70–99)
Potassium: 3.6 mmol/L (ref 3.5–5.1)
Sodium: 140 mmol/L (ref 135–145)
Total Bilirubin: 0.6 mg/dL (ref 0.3–1.2)
Total Protein: 6.5 g/dL (ref 6.5–8.1)

## 2022-10-03 LAB — IRON AND TIBC
Iron: 81 ug/dL (ref 28–170)
Saturation Ratios: 21 % (ref 10.4–31.8)
TIBC: 388 ug/dL (ref 250–450)
UIBC: 307 ug/dL

## 2022-10-03 LAB — FERRITIN: Ferritin: 20 ng/mL (ref 11–307)

## 2022-10-04 LAB — KAPPA/LAMBDA LIGHT CHAINS
Kappa free light chain: 16.3 mg/L (ref 3.3–19.4)
Kappa, lambda light chain ratio: 1.28 (ref 0.26–1.65)
Lambda free light chains: 12.7 mg/L (ref 5.7–26.3)

## 2022-10-08 ENCOUNTER — Inpatient Hospital Stay: Payer: Medicare Other | Admitting: Internal Medicine

## 2022-10-08 NOTE — Assessment & Plan Note (Deleted)
#  October 2022 [neurology] IgG lambda 0.2 g/dL; kappa lambda light chain ratio normal.  Chronic kidney disease GFR 50-60; mild anemia hemoglobin 11; normal calcium.  No worsening bone pain.  Clinical suggestive of MGUS.  FEB 2024-stable hemoglobin; normal calcium; kappa lambda light chain ratio normal; M protein pending..  # MGUS-long discussion with the patient regarding natural history of MGUS; small risk of progression to multiple myeloma. Patient is less likely at this time patient has any active myeloma-although given renal insufficiency/anemia [see discussion below]. For now will keep checking 6 months; and then yearly.   #Anemia-secondary to CKD-mild around 11; recommend gentle iron- once  Day.   #Chronic kidney disease-stage-stage III [Dr.K]; followed by nephrology.    # DISPOSITION: # NO labs today # Follow up in 6  months- 1 week Prior- cbc/cmp/MM panel; K/l light chains; iron studies/ferritin-Dr.B

## 2022-10-08 NOTE — Progress Notes (Deleted)
[facility] NOTE  Patient Care Team: Idelle Crouch, MD as PCP - General (Internal Medicine) Cammie Sickle, MD as Consulting Physician (Hematology) Vladimir Crofts, MD as Consulting Physician (Neurology)  CHIEF COMPLAINTS/PURPOSE OF CONSULTATION: Monoclonal gammopathy  HEMATOLOGY HISTORY  # OCT 2022 [Dr.Shah; Neuropathy-]Immunofixation shows IgG monoclonal protein with lambda light chain specificity; K/L=WNL; MAY 2023- 0.'1mg'$ /dl; K/l= N.   # CKD stage- IIIA- [Dr.Korrpati]; # Temporal arteritis: Continue the small dose of prednisone daily; OA  HISTORY OF PRESENTING ILLNESS: Alone.  Ambulating independently.  Dawn Mcintyre 89 y.o.  female is here for a follow up for work-up for monoclonal gammopathy.  Patient denies any new symptoms.  Denies any worsening joint pains or bone pain.  Chronic osteoarthritis.  Review of Systems  Constitutional:  Negative for chills, diaphoresis, fever, malaise/fatigue and weight loss.  HENT:  Negative for nosebleeds and sore throat.   Eyes:  Negative for double vision.  Respiratory:  Negative for cough, hemoptysis, sputum production, shortness of breath and wheezing.   Cardiovascular:  Negative for chest pain, palpitations, orthopnea and leg swelling.  Gastrointestinal:  Negative for abdominal pain, blood in stool, constipation, diarrhea, heartburn, melena, nausea and vomiting.  Genitourinary:  Negative for dysuria, frequency and urgency.  Musculoskeletal:  Negative for back pain and joint pain.  Skin: Negative.  Negative for itching and rash.  Neurological:  Positive for tingling. Negative for dizziness, focal weakness, weakness and headaches.  Endo/Heme/Allergies:  Does not bruise/bleed easily.  Psychiatric/Behavioral:  Negative for depression. The patient is not nervous/anxious and does not have insomnia.    MEDICAL HISTORY:  Past Medical History:  Diagnosis Date  . Arteritis ([place])   . Arthritis   . Atrophic  vaginitis 11/16/2014  . Carotid artery stenosis   . Cataract   . Difficult intubation   . Environmental and seasonal allergies   . GERD (gastroesophageal reflux disease)   . Hyperlipidemia   . Hypertension   . Lack of bladder control   . Neuropathy   . Osteoporosis   . Pneumonia     x 60yr ago  . Pre-diabetes   . Shingles   . Ulcer     SURGICAL HISTORY: Past Surgical History:  Procedure Laterality Date  . ABDOMINAL HYSTERECTOMY  1972  . BREAST CYST EXCISION Right 1972  . BREAST CYST EXCISION Left 1972  . ENDARTERECTOMY Left 12/24/2017   Procedure: ENDARTERECTOMY CAROTID;  Surgeon: SKatha Cabal MD;  Location: [facility] ORS;  Service: Vascular;  Laterality: Left;  . ENDARTERECTOMY Right 03/13/2018   Procedure: ENDARTERECTOMY CAROTID;  Surgeon: SKatha Cabal MD;  Location: [facility] ORS;  Service: Vascular;  Laterality: Right;  . ESOPHAGOGASTRODUODENOSCOPY (EGD) WITH PROPOFOL N/A 11/28/2020   Procedure: ESOPHAGOGASTRODUODENOSCOPY (EGD) WITH PROPOFOL;  Surgeon: WLucilla Lame MD;  [facility];  Service: Endoscopy;  Laterality: N/A;  . EYE SURGERY    . RENAL ANGIOGRAPHY N/A 03/05/2022   Procedure: RENAL ANGIOGRAPHY;  Surgeon: SKatha Cabal MD;  [facility];  Service: Cardiovascular;  Laterality: N/A;  . THROAT SURGERY     x 10 yrs ago. throat mass.  . TONSILLECTOMY      SOCIAL HISTORY: Social History   Socioeconomic History  . Marital status: Widowed    Spouse name: Not on file  . Number of children: Not on file  . Years of education: Not on file  . Highest education level: Not on file  Occupational History  . Not on file  Tobacco  Use  . Smoking status: Never    Passive exposure: Never  . Smokeless tobacco: Never  Vaping Use  . Vaping Use: Never used  Substance and Sexual Activity  . Alcohol use: No    Alcohol/week: 0.0 standard drinks of alcohol  . Drug use: No  . Sexual activity: Not Currently  Other Topics Concern  . Not on  file  Social History Narrative  . Not on file   Social Determinants of Health   Financial Resource Strain: Not on file  Food Insecurity: Not on file  Transportation Needs: Not on file  Physical Activity: Not on file  Stress: Not on file  Social Connections: Not on file  Intimate Partner Violence: Not on file    FAMILY HISTORY: Family History  Problem Relation Age of Onset  . Anuerysm Mother   . Prostate cancer Father   . Esophageal cancer Sister   . Lung cancer Sister   . Diabetes Brother   . Hearing loss Brother   . Bladder Cancer Neg Hx   . Kidney cancer Neg Hx   . Breast cancer Neg Hx     ALLERGIES:  is allergic to codeine, hydrocodone, metronidazole, propoxyphene, sulfa antibiotics, amoxicillin-pot clavulanate, and latex.  MEDICATIONS:  Current Outpatient Medications  Medication Sig Dispense Refill  . acetaminophen (TYLENOL) 500 MG tablet Take 500 mg by mouth daily as needed for headache.    . ALPRAZolam (XANAX) 0.25 MG tablet Take 0.25 mg by mouth at bedtime as needed for sleep.    Marland Kitchen ascorbic acid (VITAMIN C) 1000 MG tablet Take 1,000 mg by mouth daily.    Marland Kitchen aspirin 81 MG tablet Take 81 mg by mouth daily.    Marland Kitchen atorvastatin (LIPITOR) 10 MG tablet Take 1 tablet (10 mg total) by mouth daily. 30 tablet 4  . Cholecalciferol (VITAMIN D3) 1.25 MG (50000 UT) CAPS Take 1 capsule by mouth daily.    . clopidogrel (PLAVIX) 75 MG tablet TAKE 1 TABLET BY MOUTH EVERY DAY 30 tablet 4  . conjugated estrogens (PREMARIN) vaginal cream Apply one pea-sized amount around the opening of the urethra three times weekly. 30 g 3  . felodipine (PLENDIL) 2.5 MG 24 hr tablet Take 2.5 mg by mouth daily. May take a second 2.5 mg dose as needed if bp is over 150/90    . fexofenadine (ALLEGRA) 180 MG tablet Take 180 mg by mouth daily as needed for allergies.     . fluconazole (DIFLUCAN) 150 MG tablet Take 1 tablet (150 mg total) by mouth every 3 (three) days. For three doses 3 tablet 3  . fluticasone  (FLONASE) 50 MCG/ACT nasal spray Place into both nostrils.    Marland Kitchen gabapentin (NEURONTIN) 100 MG capsule Take 100 mg by mouth 3 (three) times daily.    . hydrALAZINE (APRESOLINE) 25 MG tablet Take 25 mg by mouth 3 (three) times daily.    . hydrochlorothiazide (HYDRODIURIL) 12.5 MG tablet Take 1 tablet by mouth daily.    . hydrOXYzine (ATARAX) 25 MG tablet Take 25 mg by mouth daily.    . isosorbide mononitrate (IMDUR) 60 MG 24 hr tablet Take 60 mg by mouth daily.    Marland Kitchen losartan (COZAAR) 100 MG tablet Take 100 mg by mouth daily.    . metoprolol tartrate (LOPRESSOR) 100 MG tablet Take 100 mg by mouth 2 (two) times daily.    . metroNIDAZOLE (METROGEL) 0.75 % vaginal gel Place vaginally at bedtime.    . mirabegron ER (MYRBETRIQ) 25 MG TB24  tablet Take 25 mg by mouth daily.    . nitrofurantoin, macrocrystal-monohydrate, (MACROBID) 100 MG capsule Take 1 capsule (100 mg total) by mouth 2 (two) times daily. 14 capsule 0  . nitrofurantoin, macrocrystal-monohydrate, (MACROBID) 100 MG capsule Take 1 capsule (100 mg total) by mouth daily. 30 capsule 11  . nystatin cream (MYCOSTATIN) Apply 1 Application topically 2 (two) times daily. 30 g 1  . ondansetron (ZOFRAN) 4 MG tablet Take 4 mg by mouth every 8 (eight) hours as needed.    . pantoprazole (PROTONIX) 40 MG tablet Take 1 tablet (40 mg total) by mouth 2 (two) times daily before a meal. 60 tablet 0  . predniSONE (DELTASONE) 2.5 MG tablet Take 2.5 mg by mouth daily.    . vitamin E 180 MG (400 UNITS) capsule Take 400 Units by mouth daily.     No current facility-administered medications for this visit.      PHYSICAL EXAMINATION:   There were no vitals filed for this visit.  There were no vitals filed for this visit.   Physical Exam Vitals and nursing note reviewed.  HENT:     Head: Normocephalic and atraumatic.     Mouth/Throat:     Pharynx: Oropharynx is clear.  Eyes:     Extraocular Movements: Extraocular movements intact.     Pupils: Pupils  are equal, round, and reactive to light.  Cardiovascular:     Rate and Rhythm: Normal rate and regular rhythm.  Pulmonary:     Comments: Decreased breath sounds bilaterally.  Abdominal:     Palpations: Abdomen is soft.  Musculoskeletal:        General: Normal range of motion.     Cervical back: Normal range of motion.  Skin:    General: Skin is warm.  Neurological:     General: No focal deficit present.     Mental Status: She is alert and oriented to person, place, and time.  Psychiatric:        Behavior: Behavior normal.        Judgment: Judgment normal.    LABORATORY DATA:  I have reviewed the data as listed Lab Results  Component Value Date   WBC 5.6 10/03/2022   HGB 11.6 (L) 10/03/2022   HCT 35.7 (L) 10/03/2022   MCV 95.2 10/03/2022   PLT 199 10/03/2022   Recent Labs    12/10/21 1430 12/19/21 1427 12/29/21 1236 01/07/22 1046 03/05/22 0725 10/03/22 0804  NA 136   < > 138 138  --  140  K 4.0   < > 4.0 3.4*  --  3.6  CL 103   < > 102 103  --  104  CO2 28   < > 29 27  --  27  GLUCOSE 142*   < > 136* 128*  --  99  BUN 18   < > '15 13 14 16  '$ CREATININE 0.94   < > 0.87 0.89 0.90 0.89  CALCIUM 9.3   < > 9.9 10.1  --  9.8  GFRNONAA 59*   < > >60 >60 >60 >60  PROT 6.7  --   --  6.6  --  6.5  ALBUMIN 3.6  --   --  3.7  --  3.7  AST 15  --   --  20  --  17  ALT 10  --   --  10  --  11  ALKPHOS 71  --   --  69  --  62  BILITOT 0.4  --   --  0.7  --  0.6   < > = values in this interval not displayed.      No results found.  Lab Results  Component Value Date   KPAFRELGTCHN 16.3 10/03/2022   KPAFRELGTCHN 15.6 12/10/2021   LAMBDASER 12.7 10/03/2022   LAMBDASER 10.1 12/10/2021   KAPLAMBRATIO 1.28 10/03/2022   KAPLAMBRATIO 1.54 12/10/2021     No problem-specific Assessment & Plan notes found for this encounter.    All questions were answered. The patient knows to call the clinic with any problems, questions or concerns.      Cammie Sickle,  MD 10/08/2022 10:22 AM

## 2022-10-09 LAB — MULTIPLE MYELOMA PANEL, SERUM
Albumin SerPl Elph-Mcnc: 3.6 g/dL (ref 2.9–4.4)
Albumin/Glob SerPl: 1.6 (ref 0.7–1.7)
Alpha 1: 0.2 g/dL (ref 0.0–0.4)
Alpha2 Glob SerPl Elph-Mcnc: 0.7 g/dL (ref 0.4–1.0)
B-Globulin SerPl Elph-Mcnc: 0.9 g/dL (ref 0.7–1.3)
Gamma Glob SerPl Elph-Mcnc: 0.4 g/dL (ref 0.4–1.8)
Globulin, Total: 2.3 g/dL (ref 2.2–3.9)
IgA: 155 mg/dL (ref 64–422)
IgG (Immunoglobin G), Serum: 566 mg/dL — ABNORMAL LOW (ref 586–1602)
IgM (Immunoglobulin M), Srm: 26 mg/dL (ref 26–217)
Total Protein ELP: 5.9 g/dL — ABNORMAL LOW (ref 6.0–8.5)

## 2022-10-10 ENCOUNTER — Other Ambulatory Visit (INDEPENDENT_AMBULATORY_CARE_PROVIDER_SITE_OTHER): Payer: Self-pay | Admitting: Nurse Practitioner

## 2022-10-10 ENCOUNTER — Ambulatory Visit: Payer: Medicare Other | Admitting: Podiatry

## 2022-10-10 DIAGNOSIS — I701 Atherosclerosis of renal artery: Secondary | ICD-10-CM

## 2022-10-12 NOTE — Progress Notes (Signed)
MRN : [id number]  Dawn Mcintyre is a 89 y.o. (Apr 01, 1933) female who presents with chief complaint of check circulation.  History of Present Illness:   The patient returns today for follow up regarding right renal artery stent placement on 03/05/2022.   It is noted that the patient's blood pressure is elevated currently but she notes that she has not had any medication this morning. The patient notes that typically when she takes her medication her blood pressure typically ranges in the [id number] systolic range. She notes that in fact she has been having some recent low blood pressures as low as 99 systolic. Previously, she notes that since the stent placement her blood pressure has been getting lower than before she has had to adjust her medications to ensure she does not have hypotensive episodes. Today, she notes that she has been having more elevated blood pressures. But she notes that she continues to self adjust her medications.    It was also noted that during angiogram there was a distal 70% stenosis of the left renal artery today the patient has a patent right stent with no evidence of significant stenosis. The left has a 1 to 59% stenosis.   The patient's bilateral internal carotid arteries also have no evidence of significant stenosis post bilateral carotid endarterectomies.   No outpatient medications have been marked as taking for the 10/14/22 encounter (Appointment) with Delana Meyer, Dolores Lory, MD.    Past Medical History:  Diagnosis Date   Arteritis [facility])    Arthritis    Atrophic vaginitis 11/16/2014   Carotid artery stenosis    Cataract    Difficult intubation    Environmental and seasonal allergies    GERD (gastroesophageal reflux disease)    Hyperlipidemia    Hypertension    Lack of bladder control    Neuropathy    Osteoporosis    Pneumonia     x 36yr ago   Pre-diabetes    Shingles    Ulcer     Past Surgical History:  Procedure Laterality Date    ABDOMINAL HYSTERECTOMY  1972   BREAST CYST EXCISION Right 1972   BREAST CYST EXCISION Left 1972   ENDARTERECTOMY Left 12/24/2017   Procedure: ENDARTERECTOMY CAROTID;  Surgeon: SKatha Cabal MD;  Location: [facility] ORS;  Service: Vascular;  Laterality: Left;   ENDARTERECTOMY Right 03/13/2018   Procedure: ENDARTERECTOMY CAROTID;  Surgeon: SKatha Cabal MD;  Location: [facility] ORS;  Service: Vascular;  Laterality: Right;   ESOPHAGOGASTRODUODENOSCOPY (EGD) WITH PROPOFOL N/A 11/28/2020   Procedure: ESOPHAGOGASTRODUODENOSCOPY (EGD) WITH PROPOFOL;  Surgeon: WLucilla Lame MD;  Location: [facility] ENDOSCOPY;  Service: Endoscopy;  Laterality: N/A;   EYE SURGERY     RENAL ANGIOGRAPHY N/A 03/05/2022   Procedure: RENAL ANGIOGRAPHY;  Surgeon: SKatha Cabal MD;  [place] [facility];  Service: Cardiovascular;  Laterality: N/A;   THROAT SURGERY     x 10 yrs ago. throat mass.   TONSILLECTOMY      Social History Social History   Tobacco Use   Smoking status: Never    Passive exposure: Never   Smokeless tobacco: Never  Vaping Use   Vaping Use: Never used  Substance Use Topics   Alcohol use: No    Alcohol/week: 0.0 standard drinks of alcohol   Drug use: No    Family History Family History  Problem Relation Age of Onset   Anuerysm Mother  Prostate cancer Father    Esophageal cancer Sister    Lung cancer Sister    Diabetes Brother    Hearing loss Brother    Bladder Cancer Neg Hx    Kidney cancer Neg Hx    Breast cancer Neg Hx     Allergies  Allergen Reactions   Codeine Hives   Hydrocodone Nausea Only   Metronidazole Nausea Only   Propoxyphene Nausea Only   Sulfa Antibiotics Hives   Amoxicillin-Pot Clavulanate Diarrhea   Latex Rash     REVIEW OF SYSTEMS (Negative unless checked)  Constitutional: '[]'$ Weight loss  '[]'$ Fever  '[]'$ Chills Cardiac: '[]'$ Chest pain   '[]'$ Chest pressure   '[]'$ Palpitations   '[]'$ Shortness of breath when laying flat   '[]'$ Shortness of breath with  exertion. Vascular:  '[x]'$ Pain in legs with walking   '[]'$ Pain in legs at rest  '[]'$ History of DVT   '[]'$ Phlebitis   '[]'$ Swelling in legs   '[]'$ Varicose veins   '[]'$ Non-healing ulcers Pulmonary:   '[]'$ Uses home oxygen   '[]'$ Productive cough   '[]'$ Hemoptysis   '[]'$ Wheeze  '[]'$ COPD   '[]'$ Asthma Neurologic:  '[]'$ Dizziness   '[]'$ Seizures   '[]'$ History of stroke   '[]'$ History of TIA  '[]'$ Aphasia   '[]'$ Vissual changes   '[]'$ Weakness or numbness in arm   '[]'$ Weakness or numbness in leg Musculoskeletal:   '[]'$ Joint swelling   '[]'$ Joint pain   '[]'$ Low back pain Hematologic:  '[]'$ Easy bruising  '[]'$ Easy bleeding   '[]'$ Hypercoagulable state   '[]'$ Anemic Gastrointestinal:  '[]'$ Diarrhea   '[]'$ Vomiting  '[]'$ Gastroesophageal reflux/heartburn   '[]'$ Difficulty swallowing. Genitourinary:  '[]'$ Chronic kidney disease   '[]'$ Difficult urination  '[]'$ Frequent urination   '[]'$ Blood in urine Skin:  '[]'$ Rashes   '[]'$ Ulcers  Psychological:  '[]'$ History of anxiety   '[]'$  History of major depression.  Physical Examination  There were no vitals filed for this visit. There is no height or weight on file to calculate BMI. Gen: WD/WN, NAD Head: [place]/AT, No temporalis wasting.  Ear/Nose/Throat: Hearing grossly intact, nares w/o erythema or drainage Eyes: PER, EOMI, sclera nonicteric.  Neck: Supple, no masses.  No bruit or JVD.  Pulmonary:  Good air movement, no audible wheezing, no use of accessory muscles.  Cardiac: RRR, normal S1, S2, no Murmurs. Vascular:  mild trophic changes, no open wounds Vessel Right Left  Radial Palpable Palpable  PT Not Palpable Not Palpable  DP Not Palpable Not Palpable  Gastrointestinal: soft, non-distended. No guarding/no peritoneal signs.  Musculoskeletal: M/S 5/5 throughout.  No visible deformity.  Neurologic: CN 2-12 intact. Pain and light touch intact in extremities.  Symmetrical.  Speech is fluent. Motor exam as listed above. Psychiatric: Judgment intact, Mood & affect appropriate for pt's clinical situation. Dermatologic: No rashes or ulcers noted.  No changes  consistent with cellulitis.   CBC Lab Results  Component Value Date   WBC 5.6 10/03/2022   HGB 11.6 (L) 10/03/2022   HCT 35.7 (L) 10/03/2022   MCV 95.2 10/03/2022   PLT 199 10/03/2022    BMET    Component Value Date/Time   NA 140 10/03/2022 0804   NA 139 09/12/2011 0250   K 3.6 10/03/2022 0804   K 3.5 09/12/2011 0250   CL 104 10/03/2022 0804   CL 102 09/12/2011 0250   CO2 27 10/03/2022 0804   CO2 23 09/12/2011 0250   GLUCOSE 99 10/03/2022 0804   GLUCOSE 162 (H) 09/12/2011 0250   BUN 16 10/03/2022 0804   BUN 20 05/30/2016 1358   BUN 23 (H) 09/12/2011 0250   CREATININE 0.89 10/03/2022  0804   CREATININE 0.94 09/12/2011 0250   CALCIUM 9.8 10/03/2022 0804   CALCIUM 9.1 09/12/2011 0250   GFRNONAA >60 10/03/2022 0804   GFRNONAA >60 09/12/2011 0250   GFRAA 55 (L) 10/04/2019 1828   GFRAA >60 09/12/2011 0250   CrCl cannot be calculated (Unknown ideal weight.).  COAG Lab Results  Component Value Date   INR 1.1 10/28/2021   INR 0.99 03/05/2018   INR 1.02 12/16/2017    Radiology No results found.   Assessment/Plan There are no diagnoses linked to this encounter.   [name], MD  10/12/2022 2:12 PM

## 2022-10-14 ENCOUNTER — Ambulatory Visit (INDEPENDENT_AMBULATORY_CARE_PROVIDER_SITE_OTHER): Payer: Medicare Other

## 2022-10-14 ENCOUNTER — Ambulatory Visit (INDEPENDENT_AMBULATORY_CARE_PROVIDER_SITE_OTHER): Payer: Medicare Other | Admitting: Vascular Surgery

## 2022-10-14 ENCOUNTER — Encounter (INDEPENDENT_AMBULATORY_CARE_PROVIDER_SITE_OTHER): Payer: Self-pay | Admitting: Vascular Surgery

## 2022-10-14 VITALS — BP 137/67 | HR 70 | Resp 16 | Wt 140.0 lb

## 2022-10-14 DIAGNOSIS — I10 Essential (primary) hypertension: Secondary | ICD-10-CM | POA: Diagnosis not present

## 2022-10-14 DIAGNOSIS — I6523 Occlusion and stenosis of bilateral carotid arteries: Secondary | ICD-10-CM

## 2022-10-14 DIAGNOSIS — I739 Peripheral vascular disease, unspecified: Secondary | ICD-10-CM | POA: Diagnosis not present

## 2022-10-14 DIAGNOSIS — I701 Atherosclerosis of renal artery: Secondary | ICD-10-CM | POA: Diagnosis not present

## 2022-10-14 DIAGNOSIS — I25118 Atherosclerotic heart disease of native coronary artery with other forms of angina pectoris: Secondary | ICD-10-CM

## 2022-10-30 ENCOUNTER — Inpatient Hospital Stay: Payer: Medicare Other | Admitting: Internal Medicine

## 2022-11-18 ENCOUNTER — Encounter: Payer: Self-pay | Admitting: Urology

## 2022-11-18 ENCOUNTER — Ambulatory Visit: Payer: Medicare Other | Admitting: Urology

## 2022-11-18 VITALS — BP 178/77 | HR 77 | Ht 67.0 in | Wt 140.0 lb

## 2022-11-18 DIAGNOSIS — N3020 Other chronic cystitis without hematuria: Secondary | ICD-10-CM

## 2022-11-18 LAB — URINALYSIS, COMPLETE
Bilirubin, UA: NEGATIVE
Glucose, UA: NEGATIVE
Ketones, UA: NEGATIVE
Leukocytes,UA: NEGATIVE
Nitrite, UA: NEGATIVE
RBC, UA: NEGATIVE
Specific Gravity, UA: 1.025 (ref 1.005–1.030)
Urobilinogen, Ur: 0.2 mg/dL (ref 0.2–1.0)
pH, UA: 6 (ref 5.0–7.5)

## 2022-11-18 LAB — MICROSCOPIC EXAMINATION

## 2022-11-18 NOTE — Progress Notes (Signed)
11/18/2022 11:24 AM   Dawn Mcintyre [birth year] [id number]  Referring provider: Marguarite Arbour, MD [address] [facility] [place],  [place] [address]  Chief Complaint  Patient presents with   Cysto    HPI: Multiple providers-recurrent UTIs 2020 and had a negative CT scan   Today Patient has a bladder infection every 2 months with abdominal pain frequency and foul-smelling urine that respond favorably to antibiotics.  She thinks she might be infected today because she has a little bit of terminal burning   At baseline she voids every 2 hours gets up least 4 times a day.  No ankle edema.  She is continent   She describes an arterial procedure on her renal artery that helped her hypertension with a stent in the last many months.  Kidneys were otherwise healthy January 17, 2022.  She had positive cultures in the medical record    Has recurrent bladder infections.  Pathophysiology described.  Based upon last positive culture and allergies I called in Macrodantin 100 mg 3 twice a day for 7 days.  Then I did put her on daily Macrodantin 100 mg 30 x 11.d  Return in 8 weeks for cystoscopy.  She gets hives with sulfa drugs.  She gets diarrhea with Augmentin   Today Frequent stable.  Last culture positive Clinically infection free and she chose not to have cystoscopy which I thought was fine.  She still gets up 3 times at night.  She states she had some Myrbetriq at home and she may or may not go back on it.   PMH: Past Medical History:  Diagnosis Date   Arteritis    Arthritis    Atrophic vaginitis 11/16/2014   Carotid artery stenosis    Cataract    Difficult intubation    Environmental and seasonal allergies    GERD (gastroesophageal reflux disease)    Hyperlipidemia    Hypertension    Lack of bladder control    Neuropathy    Osteoporosis    Pneumonia     x 73yrs ago   Pre-diabetes    Shingles    Ulcer     Surgical History: Past Surgical History:   Procedure Laterality Date   ABDOMINAL HYSTERECTOMY  1972   BREAST CYST EXCISION Right 1972   BREAST CYST EXCISION Left 1972   ENDARTERECTOMY Left 12/24/2017   Procedure: ENDARTERECTOMY CAROTID;  Surgeon: Renford Dills, MD;  Location: [facility] ORS;  Service: Vascular;  Laterality: Left;   ENDARTERECTOMY Right 03/13/2018   Procedure: ENDARTERECTOMY CAROTID;  Surgeon: Renford Dills, MD;  Location: [facility] ORS;  Service: Vascular;  Laterality: Right;   ESOPHAGOGASTRODUODENOSCOPY (EGD) WITH PROPOFOL N/A 11/28/2020   Procedure: ESOPHAGOGASTRODUODENOSCOPY (EGD) WITH PROPOFOL;  Surgeon: Midge Minium, MD;  Location: [facility] ENDOSCOPY;  Service: Endoscopy;  Laterality: N/A;   EYE SURGERY     RENAL ANGIOGRAPHY N/A 03/05/2022   Procedure: RENAL ANGIOGRAPHY;  Surgeon: Renford Dills, MD;  Location: [facility];  Service: Cardiovascular;  Laterality: N/A;   THROAT SURGERY     x 10 yrs ago. throat mass.   TONSILLECTOMY      Home Medications:  Allergies as of 11/18/2022       Reactions   Codeine Hives   Hydrocodone Nausea Only   Metronidazole Nausea Only   Propoxyphene Nausea Only   Sulfa Antibiotics Hives   Amoxicillin-pot Clavulanate Diarrhea   Latex Rash        Medication List  Accurate as of November 18, 2022 11:24 AM. If you have any questions, ask your nurse or doctor.          acetaminophen 500 MG tablet Commonly known as: TYLENOL Take 500 mg by mouth daily as needed for headache.   ALPRAZolam 0.25 MG tablet Commonly known as: XANAX Take 0.25 mg by mouth at bedtime as needed for sleep.   ascorbic acid 1000 MG tablet Commonly known as: VITAMIN C Take 1,000 mg by mouth daily.   aspirin 81 MG tablet Take 81 mg by mouth daily.   atorvastatin 10 MG tablet Commonly known as: Lipitor Take 1 tablet (10 mg total) by mouth daily.   clopidogrel 75 MG tablet Commonly known as: PLAVIX TAKE 1 TABLET BY MOUTH EVERY DAY   felodipine 2.5 MG 24 hr tablet Commonly  known as: PLENDIL Take 2.5 mg by mouth daily. May take a second 2.5 mg dose as needed if bp is over 150/90   fexofenadine 180 MG tablet Commonly known as: ALLEGRA Take 180 mg by mouth daily as needed for allergies.   fluconazole 150 MG tablet Commonly known as: DIFLUCAN Take 1 tablet (150 mg total) by mouth every 3 (three) days. For three doses   fluticasone 50 MCG/ACT nasal spray Commonly known as: FLONASE Place into both nostrils.   gabapentin 100 MG capsule Commonly known as: NEURONTIN Take 100 mg by mouth 3 (three) times daily.   hydrALAZINE 25 MG tablet Commonly known as: APRESOLINE Take 25 mg by mouth 3 (three) times daily.   hydrochlorothiazide 12.5 MG tablet Commonly known as: HYDRODIURIL Take 1 tablet by mouth daily.   hydrOXYzine 25 MG tablet Commonly known as: ATARAX Take 25 mg by mouth daily.   isosorbide mononitrate 60 MG 24 hr tablet Commonly known as: IMDUR Take 60 mg by mouth daily.   losartan 100 MG tablet Commonly known as: COZAAR Take 100 mg by mouth daily.   metoprolol tartrate 100 MG tablet Commonly known as: LOPRESSOR Take 100 mg by mouth 2 (two) times daily.   metroNIDAZOLE 0.75 % vaginal gel Commonly known as: METROGEL Place vaginally at bedtime.   mirabegron ER 25 MG Tb24 tablet Commonly known as: MYRBETRIQ Take 25 mg by mouth daily.   nitrofurantoin (macrocrystal-monohydrate) 100 MG capsule Commonly known as: MACROBID Take 1 capsule (100 mg total) by mouth daily. What changed: Another medication with the same name was removed. Continue taking this medication, and follow the directions you see here. Changed by: Martina Sinner, MD   nystatin cream Commonly known as: MYCOSTATIN Apply 1 Application topically 2 (two) times daily.   ondansetron 4 MG tablet Commonly known as: ZOFRAN Take 4 mg by mouth every 8 (eight) hours as needed.   pantoprazole 40 MG tablet Commonly known as: Protonix Take 1 tablet (40 mg total) by mouth 2  (two) times daily before a meal.   predniSONE 2.5 MG tablet Commonly known as: DELTASONE Take 2.5 mg by mouth daily.   Premarin vaginal cream Generic drug: conjugated estrogens Apply one pea-sized amount around the opening of the urethra three times weekly.   Vitamin D3 1.25 MG (50000 UT) Caps Take 1 capsule by mouth daily.   vitamin E 180 MG (400 UNITS) capsule Take 400 Units by mouth daily.        Allergies:  Allergies  Allergen Reactions   Codeine Hives   Hydrocodone Nausea Only   Metronidazole Nausea Only   Propoxyphene Nausea Only   Sulfa Antibiotics Hives  Amoxicillin-Pot Clavulanate Diarrhea   Latex Rash    Family History: Family History  Problem Relation Age of Onset   Anuerysm Mother    Prostate cancer Father    Esophageal cancer Sister    Lung cancer Sister    Diabetes Brother    Hearing loss Brother    Bladder Cancer Neg Hx    Kidney cancer Neg Hx    Breast cancer Neg Hx     Social History:  reports that she has never smoked. She has never been exposed to tobacco smoke. She has never used smokeless tobacco. She reports that she does not drink alcohol and does not use drugs.  ROS:                                        Physical Exam: BP (!) 178/77   Pulse 77   Ht  (1.702 m)   Wt 63.5 kg   BMI 21.93 kg/m   Constitutional:  Alert and oriented, No acute distress. HEENT: [place] AT, moist mucus membranes.  Trachea midline, no masses.  Laboratory Data: Lab Results  Component Value Date   WBC 5.6 10/03/2022   HGB 11.6 (L) 10/03/2022   HCT 35.7 (L) 10/03/2022   MCV 95.2 10/03/2022   PLT 199 10/03/2022    Lab Results  Component Value Date   CREATININE 0.89 10/03/2022    No results found for: "PSA"  No results found for: "TESTOSTERONE"  Lab Results  Component Value Date   HGBA1C 6.0 (H) 10/28/2021    Urinalysis    Component Value Date/Time   COLORURINE YELLOW (A) 12/19/2021 1427   APPEARANCEUR Cloudy  (A) 09/16/2022 1344   LABSPEC 1.019 12/19/2021 1427   LABSPEC 1.026 09/12/2011 2304   PHURINE 5.0 12/19/2021 1427   GLUCOSEU Negative 09/16/2022 1344   GLUCOSEU Negative 09/12/2011 2304   HGBUR NEGATIVE 12/19/2021 1427   BILIRUBINUR Negative 09/16/2022 1344   BILIRUBINUR Negative 09/12/2011 2304   KETONESUR NEGATIVE 12/19/2021 1427   PROTEINUR 1+ (A) 09/16/2022 1344   PROTEINUR 30 (A) 12/19/2021 1427   UROBILINOGEN 0.2 04/12/2021 1144   NITRITE Positive (A) 09/16/2022 1344   NITRITE NEGATIVE 12/19/2021 1427   LEUKOCYTESUR 1+ (A) 09/16/2022 1344   LEUKOCYTESUR SMALL (A) 12/19/2021 1427   LEUKOCYTESUR 2+ 09/12/2011 2304    Pertinent Imaging:   Assessment & Plan: Reassess in 6 months  1. Chronic cystitis  - Urinalysis, Complete   No follow-ups on file.  Martina Sinner, MD  [facility] [address], [address] [place], [place] [address] [phone]

## 2022-11-20 ENCOUNTER — Inpatient Hospital Stay: Payer: Medicare Other

## 2022-11-20 ENCOUNTER — Inpatient Hospital Stay: Payer: Medicare Other | Attending: Internal Medicine

## 2022-11-20 DIAGNOSIS — N183 Chronic kidney disease, stage 3 unspecified: Secondary | ICD-10-CM | POA: Diagnosis not present

## 2022-11-20 DIAGNOSIS — D649 Anemia, unspecified: Secondary | ICD-10-CM | POA: Insufficient documentation

## 2022-11-20 DIAGNOSIS — D472 Monoclonal gammopathy: Secondary | ICD-10-CM | POA: Insufficient documentation

## 2022-11-20 LAB — CMP (CANCER CENTER ONLY)
ALT: 12 U/L (ref 0–44)
AST: 20 U/L (ref 15–41)
Albumin: 4 g/dL (ref 3.5–5.0)
Alkaline Phosphatase: 74 U/L (ref 38–126)
Anion gap: 7 (ref 5–15)
BUN: 24 mg/dL — ABNORMAL HIGH (ref 8–23)
CO2: 22 mmol/L (ref 22–32)
Calcium: 10 mg/dL (ref 8.9–10.3)
Chloride: 107 mmol/L (ref 98–111)
Creatinine: 1.31 mg/dL — ABNORMAL HIGH (ref 0.44–1.00)
GFR, Estimated: 39 mL/min — ABNORMAL LOW (ref >60)
Glucose, Bld: 139 mg/dL — ABNORMAL HIGH (ref 70–99)
Potassium: 4 mmol/L (ref 3.5–5.1)
Sodium: 136 mmol/L (ref 135–145)
Total Bilirubin: 0.7 mg/dL (ref 0.3–1.2)
Total Protein: 6.7 g/dL (ref 6.5–8.1)

## 2022-11-20 LAB — CBC WITH DIFFERENTIAL (CANCER CENTER ONLY)
Abs Immature Granulocytes: 0.04 10*3/uL (ref 0.00–0.07)
Basophils Absolute: 0.1 10*3/uL (ref 0.0–0.1)
Basophils Relative: 1 %
Eosinophils Absolute: 0 10*3/uL (ref 0.0–0.5)
Eosinophils Relative: 0 %
HCT: 35.9 % — ABNORMAL LOW (ref 36.0–46.0)
Hemoglobin: 11.7 g/dL — ABNORMAL LOW (ref 12.0–15.0)
Immature Granulocytes: 1 %
Lymphocytes Relative: 16 %
Lymphs Abs: 1.3 10*3/uL (ref 0.7–4.0)
MCH: 30.8 pg (ref 26.0–34.0)
MCHC: 32.6 g/dL (ref 30.0–36.0)
MCV: 94.5 fL (ref 80.0–100.0)
Monocytes Absolute: 0.6 10*3/uL (ref 0.1–1.0)
Monocytes Relative: 8 %
Neutro Abs: 5.8 10*3/uL (ref 1.7–7.7)
Neutrophils Relative %: 74 %
Platelet Count: 216 10*3/uL (ref 150–400)
RBC: 3.8 MIL/uL — ABNORMAL LOW (ref 3.87–5.11)
RDW: 12.8 % (ref 11.5–15.5)
WBC Count: 7.8 10*3/uL (ref 4.0–10.5)
nRBC: 0 % (ref 0.0–0.2)

## 2022-11-20 LAB — IRON AND TIBC
Iron: 89 ug/dL (ref 28–170)
Saturation Ratios: 22 % (ref 10.4–31.8)
TIBC: 399 ug/dL (ref 250–450)
UIBC: 310 ug/dL

## 2022-11-20 LAB — FERRITIN: Ferritin: 16 ng/mL (ref 11–307)

## 2022-11-21 LAB — KAPPA/LAMBDA LIGHT CHAINS
Kappa free light chain: 15.2 mg/L (ref 3.3–19.4)
Kappa, lambda light chain ratio: 1.36 (ref 0.26–1.65)
Lambda free light chains: 11.2 mg/L (ref 5.7–26.3)

## 2022-11-24 LAB — MULTIPLE MYELOMA PANEL, SERUM
Albumin SerPl Elph-Mcnc: 3.7 g/dL (ref 2.9–4.4)
Albumin/Glob SerPl: 1.5 (ref 0.7–1.7)
Alpha 1: 0.3 g/dL (ref 0.0–0.4)
Alpha2 Glob SerPl Elph-Mcnc: 0.8 g/dL (ref 0.4–1.0)
B-Globulin SerPl Elph-Mcnc: 1 g/dL (ref 0.7–1.3)
Gamma Glob SerPl Elph-Mcnc: 0.5 g/dL (ref 0.4–1.8)
Globulin, Total: 2.6 g/dL (ref 2.2–3.9)
IgA: 160 mg/dL (ref 64–422)
IgG (Immunoglobin G), Serum: 645 mg/dL (ref 586–1602)
IgM (Immunoglobulin M), Srm: 28 mg/dL (ref 26–217)
Total Protein ELP: 6.3 g/dL (ref 6.0–8.5)

## 2022-11-25 ENCOUNTER — Telehealth: Payer: Self-pay | Admitting: *Deleted

## 2022-11-25 ENCOUNTER — Inpatient Hospital Stay: Payer: Medicare Other | Admitting: Internal Medicine

## 2022-11-25 ENCOUNTER — Encounter: Payer: Self-pay | Admitting: Internal Medicine

## 2022-11-25 VITALS — BP 144/59 | HR 62 | Temp 96.5°F | Ht 67.0 in | Wt 140.8 lb

## 2022-11-25 DIAGNOSIS — D472 Monoclonal gammopathy: Secondary | ICD-10-CM | POA: Diagnosis not present

## 2022-11-25 NOTE — Patient Instructions (Signed)
#  Recommend gentle iron [iron biglycinate; 28 mg ] 1 pill a day.  This pill is unlikely to cause stomach upset or cause constipation.  

## 2022-11-25 NOTE — Telephone Encounter (Signed)
Informed pt that Dr Donneta Romberg spoke to her kidney doctor. Patient will call them for an appt for next week.

## 2022-11-25 NOTE — Assessment & Plan Note (Addendum)
#  October 2022 [neurology] IgG lambda 0.2 g/dL; kappa lambda light chain ratio normal.  Chronic kidney disease GFR 50-60; mild anemia hemoglobin 11; normal calcium.  No worsening bone pain.   # APRIL 2024- Immunofixation shows IgG monoclonal protein with kappa light chain Specificity; K/L light chain= normal.  Clinically MGUS, no significant concerns of any progression of disease. Not related to multiple myeloma.    # Anemia-hemoglobin around 11 stable.  April 2024 ferritin- 16; I sat- 22  recommend gentle iron.   #Chronic kidney disease-stage-stage III [Dr.K]; followed by nephrology.  Renal function slightly worse 39  Recommend increased fluid intake.  Discussed with Dr. Verne Spurr. [place].  # DISPOSITION: # NO labs today # Follow up in 6  months- 1 week Prior- cbc/cmp/MM panel; K/l light chains; iron studies/ferritin-Dr.B

## 2022-11-25 NOTE — Progress Notes (Signed)
Had labs done last week, here.  No other concerns.

## 2022-11-25 NOTE — Progress Notes (Signed)
[facility] CONSULT NOTE  Patient Care Team: Marguarite Arbour, MD as PCP - General (Internal Medicine) Earna Coder, MD as Consulting Physician (Hematology) Lonell Face, MD as Consulting Physician (Neurology)  CHIEF COMPLAINTS/PURPOSE OF CONSULTATION: Monoclonal gammopathy  HEMATOLOGY HISTORY  # OCT 2022 [Dr.Shah; Neuropathy-]Immunofixation shows IgG monoclonal protein with lambda light chain specificity; K/L=WNL; MAY 2023- 0.1mg /dl; K/l= N.   # CKD stage- IIIA- [Dr.Korrpati]; # Temporal arteritis: Continue the small dose of prednisone daily; OA  HISTORY OF PRESENTING ILLNESS: Alone.  Ambulating independently.  Dawn Mcintyre 89 y.o.  female is here for a follow up for work-up for monoclonal gammopathy.  Patient denies any new symptoms.  Denies any worsening joint pains or bone pain.  Chronic osteoarthritis.  Review of Systems  Constitutional:  Positive for malaise/fatigue. Negative for chills, diaphoresis, fever and weight loss.  HENT:  Negative for nosebleeds and sore throat.   Eyes:  Negative for double vision.  Respiratory:  Negative for cough, hemoptysis, sputum production, shortness of breath and wheezing.   Cardiovascular:  Negative for chest pain, palpitations, orthopnea and leg swelling.  Gastrointestinal:  Negative for abdominal pain, blood in stool, constipation, diarrhea, heartburn, melena, nausea and vomiting.  Genitourinary:  Negative for dysuria, frequency and urgency.  Musculoskeletal:  Negative for back pain and joint pain.  Skin: Negative.  Negative for itching and rash.  Neurological:  Positive for tingling. Negative for dizziness, focal weakness, weakness and headaches.  Endo/Heme/Allergies:  Does not bruise/bleed easily.  Psychiatric/Behavioral:  Negative for depression. The patient is not nervous/anxious and does not have insomnia.     MEDICAL HISTORY:  Past Medical History:  Diagnosis Date   Arteritis    Arthritis     Atrophic vaginitis 11/16/2014   Carotid artery stenosis    Cataract    Difficult intubation    Environmental and seasonal allergies    GERD (gastroesophageal reflux disease)    Hyperlipidemia    Hypertension    Lack of bladder control    Neuropathy    Osteoporosis    Pneumonia     x 48yrs ago   Pre-diabetes    Shingles    Ulcer     SURGICAL HISTORY: Past Surgical History:  Procedure Laterality Date   ABDOMINAL HYSTERECTOMY  1972   BREAST CYST EXCISION Right 1972   BREAST CYST EXCISION Left 1972   ENDARTERECTOMY Left 12/24/2017   Procedure: ENDARTERECTOMY CAROTID;  Surgeon: Renford Dills, MD;  Location: [facility];  Service: Vascular;  Laterality: Left;   ENDARTERECTOMY Right 03/13/2018   Procedure: ENDARTERECTOMY CAROTID;  Surgeon: Renford Dills, MD;  Location: [facility];  Service: Vascular;  Laterality: Right;   ESOPHAGOGASTRODUODENOSCOPY (EGD) WITH PROPOFOL N/A 11/28/2020   Procedure: ESOPHAGOGASTRODUODENOSCOPY (EGD) WITH PROPOFOL;  Surgeon: Midge Minium, MD;  Location: [facility];  Service: Endoscopy;  Laterality: N/A;   EYE SURGERY     RENAL ANGIOGRAPHY N/A 03/05/2022   Procedure: RENAL ANGIOGRAPHY;  Surgeon: Renford Dills, MD;  Location: [facility] IN[facility];  Service: Cardiovascular;  Laterality: N/A;   THROAT SURGERY     x 10 yrs ago. throat mass.   TONSILLECTOMY      SOCIAL HISTORY: Social History   Socioeconomic History   Marital status: Widowed    Spouse name: Not on file   Number of children: Not on file   Years of education: Not on file   Highest education level: Not on file  Occupational History   Not on file  Tobacco Use   Smoking status: Never    Passive exposure: Never   Smokeless tobacco: Never  Vaping Use   Vaping Use: Never used  Substance and Sexual Activity   Alcohol use: No    Alcohol/week: 0.0 standard drinks of alcohol   Drug use: No   Sexual activity: Not Currently  Other Topics Concern   Not on file  Social History  Narrative   Not on file   Social Determinants of Health   Financial Resource Strain: Not on file  Food Insecurity: Not on file  Transportation Needs: Not on file  Physical Activity: Not on file  Stress: Not on file  Social Connections: Not on file  Intimate Partner Violence: Not on file    FAMILY HISTORY: Family History  Problem Relation Age of Onset   Anuerysm Mother    Prostate cancer Father    Esophageal cancer Sister    Lung cancer Sister    Diabetes Brother    Hearing loss Brother    Bladder Cancer Neg Hx    Kidney cancer Neg Hx    Breast cancer Neg Hx     ALLERGIES:  is allergic to codeine, hydrocodone, metronidazole, propoxyphene, sulfa antibiotics, amoxicillin-pot clavulanate, and latex.  MEDICATIONS:  Current Outpatient Medications  Medication Sig Dispense Refill   acetaminophen (TYLENOL) 500 MG tablet Take 500 mg by mouth daily as needed for headache.     ALPRAZolam (XANAX) 0.25 MG tablet Take 0.25 mg by mouth at bedtime as needed for sleep.     ascorbic acid (VITAMIN C) 1000 MG tablet Take 1,000 mg by mouth daily.     aspirin 81 MG tablet Take 81 mg by mouth daily.     atorvastatin (LIPITOR) 10 MG tablet Take 1 tablet (10 mg total) by mouth daily. 30 tablet 4   Cholecalciferol (VITAMIN D3) 1.25 MG (50000 UT) CAPS Take 1 capsule by mouth daily.     clopidogrel (PLAVIX) 75 MG tablet TAKE 1 TABLET BY MOUTH EVERY DAY 30 tablet 4   conjugated estrogens (PREMARIN) vaginal cream Apply one pea-sized amount around the opening of the urethra three times weekly. 30 g 3   felodipine (PLENDIL) 2.5 MG 24 hr tablet Take 2.5 mg by mouth daily. May take a second 2.5 mg dose as needed if bp is over 150/90     fexofenadine (ALLEGRA) 180 MG tablet Take 180 mg by mouth daily as needed for allergies.      fluconazole (DIFLUCAN) 150 MG tablet Take 1 tablet (150 mg total) by mouth every 3 (three) days. For three doses 3 tablet 3   fluticasone (FLONASE) 50 MCG/ACT nasal spray Place into  both nostrils.     gabapentin (NEURONTIN) 100 MG capsule Take 100 mg by mouth 3 (three) times daily.     hydrALAZINE (APRESOLINE) 25 MG tablet Take 25 mg by mouth 3 (three) times daily.     hydrochlorothiazide (HYDRODIURIL) 12.5 MG tablet Take 1 tablet by mouth daily.     hydrOXYzine (ATARAX) 25 MG tablet Take 25 mg by mouth daily.     isosorbide mononitrate (IMDUR) 60 MG 24 hr tablet Take 60 mg by mouth daily.     losartan (COZAAR) 100 MG tablet Take 100 mg by mouth daily.     metoprolol tartrate (LOPRESSOR) 100 MG tablet Take 100 mg by mouth 2 (two) times daily.     metroNIDAZOLE (METROGEL) 0.75 % vaginal gel Place vaginally at bedtime.     mirabegron ER (MYRBETRIQ) 25 MG  TB24 tablet Take 25 mg by mouth daily.     nitrofurantoin, macrocrystal-monohydrate, (MACROBID) 100 MG capsule Take 1 capsule (100 mg total) by mouth daily. 30 capsule 11   nystatin cream (MYCOSTATIN) Apply 1 Application topically 2 (two) times daily. 30 g 1   ondansetron (ZOFRAN) 4 MG tablet Take 4 mg by mouth every 8 (eight) hours as needed.     predniSONE (DELTASONE) 2.5 MG tablet Take 2.5 mg by mouth daily.     vitamin E 180 MG (400 UNITS) capsule Take 400 Units by mouth daily.     pantoprazole (PROTONIX) 40 MG tablet Take 1 tablet (40 mg total) by mouth 2 (two) times daily before a meal. 60 tablet 0   No current facility-administered medications for this visit.      PHYSICAL EXAMINATION:   Vitals:   11/25/22 1456  BP: (!) 144/59  Pulse: 62  Temp: (!) 96.5 F (35.8 C)  SpO2: 98%   Filed Weights   11/25/22 1456  Weight: 140 lb 12.8 oz (63.9 kg)    Physical Exam Vitals and nursing note reviewed.  HENT:     Head: Normocephalic and atraumatic.     Mouth/Throat:     Pharynx: Oropharynx is clear.  Eyes:     Extraocular Movements: Extraocular movements intact.     Pupils: Pupils are equal, round, and reactive to light.  Cardiovascular:     Rate and Rhythm: Normal rate and regular rhythm.  Pulmonary:      Comments: Decreased breath sounds bilaterally.  Abdominal:     Palpations: Abdomen is soft.  Musculoskeletal:        General: Normal range of motion.     Cervical back: Normal range of motion.  Skin:    General: Skin is warm.  Neurological:     General: No focal deficit present.     Mental Status: She is alert and oriented to person, place, and time.  Psychiatric:        Behavior: Behavior normal.        Judgment: Judgment normal.     LABORATORY DATA:  I have reviewed the data as listed Lab Results  Component Value Date   WBC 7.8 11/20/2022   HGB 11.7 (L) 11/20/2022   HCT 35.9 (L) 11/20/2022   MCV 94.5 11/20/2022   PLT 216 11/20/2022   Recent Labs    01/07/22 1046 03/05/22 0725 10/03/22 0804 11/20/22 1332  NA 138  --  140 136  K 3.4*  --  3.6 4.0  CL 103  --  104 107  CO2 27  --  27 22  GLUCOSE 128*  --  99 139*  BUN 24*  CREATININE 0.89 0.90 0.89 1.31*  CALCIUM 10.1  --  9.8 10.0  GFRNONAA >60 >60 >60 39*  PROT 6.6  --  6.5 6.7  ALBUMIN 3.7  --  3.7 4.0  AST 20  --  17 20  ALT 10  --  11 12  ALKPHOS 69  --  62 74  BILITOT 0.7  --  0.6 0.7     No results found.  Lab Results  Component Value Date   KPAFRELGTCHN 15.2 11/20/2022   KPAFRELGTCHN 16.3 10/03/2022   KPAFRELGTCHN 15.6 12/10/2021   LAMBDASER 11.2 11/20/2022   LAMBDASER 12.7 10/03/2022   LAMBDASER 10.1 12/10/2021   KAPLAMBRATIO 1.36 11/20/2022   KAPLAMBRATIO 1.28 10/03/2022   KAPLAMBRATIO 1.54 12/10/2021     MGUS (monoclonal gammopathy of unknown significance) #October  2022 [neurology] IgG lambda 0.2 g/dL; kappa lambda light chain ratio normal.  Chronic kidney disease GFR 50-60; mild anemia hemoglobin 11; normal calcium.  No worsening bone pain.   # APRIL 2024- Immunofixation shows IgG monoclonal protein with kappa light chain Specificity; K/L light chain= normal.  Clinically MGUS, no significant concerns of any progression of disease. Not related to multiple myeloma.    #  Anemia-hemoglobin around 11 stable.  April 2024 ferritin- 16; I sat- 22  recommend gentle iron.   #Chronic kidney disease-stage-stage III [Dr.K]; followed by nephrology.  Renal function slightly worse 39  Recommend increased fluid intake.  Discussed with Dr. Verne Spurr. [place].  # DISPOSITION: # NO labs today # Follow up in 6  months- 1 week Prior- cbc/cmp/MM panel; K/l light chains; iron studies/ferritin-Dr.B    All questions were answered. The patient knows to call the clinic with any problems, questions or concerns.      Earna Coder, MD 11/25/2022 3:33 PM

## 2023-01-22 ENCOUNTER — Other Ambulatory Visit (INDEPENDENT_AMBULATORY_CARE_PROVIDER_SITE_OTHER): Payer: Self-pay | Admitting: Vascular Surgery

## 2023-02-11 ENCOUNTER — Ambulatory Visit: Payer: Medicare Other | Admitting: Obstetrics & Gynecology

## 2023-02-13 ENCOUNTER — Other Ambulatory Visit (HOSPITAL_COMMUNITY)
Admission: RE | Admit: 2023-02-13 | Discharge: 2023-02-13 | Disposition: A | Discharge status: Home / Self Care | Payer: Medicare Other | Source: Ambulatory Visit | Attending: Obstetrics & Gynecology | Admitting: Obstetrics & Gynecology

## 2023-02-13 ENCOUNTER — Ambulatory Visit: Payer: Medicare Other | Admitting: Obstetrics & Gynecology

## 2023-02-13 ENCOUNTER — Encounter: Payer: Self-pay | Admitting: Obstetrics & Gynecology

## 2023-02-13 VITALS — BP 138/73 | HR 68

## 2023-02-13 DIAGNOSIS — R1030 Lower abdominal pain, unspecified: Secondary | ICD-10-CM | POA: Diagnosis not present

## 2023-02-13 DIAGNOSIS — R8271 Bacteriuria: Secondary | ICD-10-CM | POA: Diagnosis not present

## 2023-02-13 LAB — POCT URINALYSIS DIPSTICK
Blood, UA: NEGATIVE
Glucose, UA: NEGATIVE
Leukocytes, UA: NEGATIVE
Nitrite, UA: NEGATIVE
Protein, UA: NEGATIVE

## 2023-02-13 NOTE — Progress Notes (Signed)
GYNECOLOGY OFFICE VISIT NOTE  History:   Dawn Mcintyre is a 89 y.o. [id number] here today for evaluation of possible vaginal infection.  She reports lower abdominal/pelvic pain which she says feels is vaginal pain. Also has pain after urination. History of hysterectomy and removal of ovaries decades ago for benign indications. She denies any abnormal vaginal bleeding or other concerns.    Past Medical History:  Diagnosis Date   Arteritis (HCC)    Arthritis    Atrophic vaginitis 11/16/2014   Carotid artery stenosis    Cataract    Difficult intubation    Environmental and seasonal allergies    GERD (gastroesophageal reflux disease)    Hyperlipidemia    Hypertension    Lack of bladder control    Neuropathy    Osteoporosis    Pneumonia     x 71yrs ago   Pre-diabetes    Shingles    Ulcer     Past Surgical History:  Procedure Laterality Date   ABDOMINAL HYSTERECTOMY  1972   BREAST CYST EXCISION Right 1972   BREAST CYST EXCISION Left 1972   ENDARTERECTOMY Left 12/24/2017   Procedure: ENDARTERECTOMY CAROTID;  Surgeon: Renford Dills, MD;  Location: [facility];  Service: Vascular;  Laterality: Left;   ENDARTERECTOMY Right 03/13/2018   Procedure: ENDARTERECTOMY CAROTID;  Surgeon: Renford Dills, MD;  Location: [facility];  Service: Vascular;  Laterality: Right;   ESOPHAGOGASTRODUODENOSCOPY (EGD) WITH PROPOFOL N/A 11/28/2020   Procedure: ESOPHAGOGASTRODUODENOSCOPY (EGD) WITH PROPOFOL;  Surgeon: Midge Minium, MD;  Location: [facility];  Service: Endoscopy;  Laterality: N/A;   EYE SURGERY     RENAL ANGIOGRAPHY N/A 03/05/2022   Procedure: RENAL ANGIOGRAPHY;  Surgeon: Renford Dills, MD;  Location: [facility];  Service: Cardiovascular;  Laterality: N/A;   THROAT SURGERY     x 10 yrs ago. throat mass.   TONSILLECTOMY     The following portions of the patient's history were reviewed and updated as appropriate: allergies, current medications, past family history, past  medical history, past social history, past surgical history and problem list.   Review of Systems:  Pertinent items noted in HPI and remainder of comprehensive ROS otherwise negative.  Physical Exam:  BP 138/73   Pulse 68  CONSTITUTIONAL: Well-developed, well-nourished female in no acute distress.  HEENT:  Normocephalic, atraumatic. External right and left ear normal. No scleral icterus.  NECK: Normal range of motion, supple, no masses noted on observation SKIN: No rash noted. Not diaphoretic. No erythema. No pallor. MUSCULOSKELETAL: Normal range of motion. No edema noted. NEUROLOGIC: Alert and oriented to person, place, and time. Normal muscle tone coordination. No cranial nerve deficit noted. PSYCHIATRIC: Normal mood and affect. Normal behavior. Normal judgment and thought content. CARDIOVASCULAR: Normal heart rate noted RESPIRATORY: Effort and breath sounds normal, no problems with respiration noted ABDOMEN: Mid lower abdominal tenderness to palpation in suprapubic area. No masses noted. No other overt distention noted.   PELVIC: External genitalia with erythema around introitus, moderate atrophy noted. White discharge noted, testing sample obtained. Performed in the presence of a chaperone  Results for orders placed or performed in visit on 02/13/23 (from the past 24 hour(s))  POCT Urinalysis Dipstick     Status: Normal   Collection Time: 02/13/23  4:13 PM  Result Value Ref Range   Color, UA     Clarity, UA     Glucose, UA Negative Negative   Bilirubin, UA     Ketones, UA  Spec Grav, UA     Blood, UA Negative    pH, UA     Protein, UA Negative Negative   Urobilinogen, UA     Nitrite, UA Negative    Leukocytes, UA Negative Negative   Appearance Yellow    Odor None        Assessment and Plan:     1. Lower abdominal pain, unspecified - POCT Urinalysis Dipstick negative - Urine Culture sent - Cervicovaginal ancillary only( [place]) done Recommended OTC pain meds  as needed. Will follow up results and manage accordingly. If pain worsens/continues, will need further evaluation.   Return for any gynecologic concerns.    I spent 20 minutes dedicated to the care of this patient including pre-visit review of records, face to face time with the patient discussing her conditions and treatments and post visit orders.    Jaynie Collins, MD, FACOG Obstetrician & Gynecologist, [facility] for [employer], [facility]

## 2023-02-17 ENCOUNTER — Telehealth: Payer: Self-pay | Admitting: *Deleted

## 2023-02-17 LAB — URINE CULTURE

## 2023-02-17 LAB — CERVICOVAGINAL ANCILLARY ONLY
Bacterial Vaginitis (gardnerella): NEGATIVE
Candida Glabrata: NEGATIVE
Candida Vaginitis: NEGATIVE
Comment: NEGATIVE
Comment: NEGATIVE
Comment: NEGATIVE
Comment: NEGATIVE
Trichomonas: NEGATIVE

## 2023-02-17 MED ORDER — CEFADROXIL 500 MG PO CAPS
500.0000 mg | ORAL_CAPSULE | Freq: Two times a day (BID) | ORAL | 0 refills | Status: DC
Start: 2023-02-17 — End: 2023-02-19

## 2023-02-17 NOTE — Telephone Encounter (Signed)
-----   Message from [place] sent at 02/17/2023 12:48 PM EDT ----- Small amount of bacteria seen, not enough usually to treat for UTI, but given symptoms will err on the side of overtreating.  Cefadroxil prescribed.  Please call to inform patient of results and advise her to pick up prescription and take as directed.

## 2023-02-17 NOTE — Telephone Encounter (Signed)
Pt informed of results and medication sent in .  

## 2023-02-17 NOTE — Addendum Note (Signed)
Addended by: Jaynie Collins A on: 02/17/2023 12:48 PM   Modules accepted: Orders

## 2023-02-19 ENCOUNTER — Inpatient Hospital Stay
Admission: EM | Admit: 2023-02-19 | Discharge: 2023-02-21 | DRG: 193 | Disposition: A | Discharge status: Home / Self Care | Payer: Medicare Other | Attending: Internal Medicine | Admitting: Internal Medicine

## 2023-02-19 ENCOUNTER — Other Ambulatory Visit: Payer: Self-pay

## 2023-02-19 DIAGNOSIS — K529 Noninfective gastroenteritis and colitis, unspecified: Secondary | ICD-10-CM | POA: Diagnosis present

## 2023-02-19 DIAGNOSIS — I129 Hypertensive chronic kidney disease with stage 1 through stage 4 chronic kidney disease, or unspecified chronic kidney disease: Secondary | ICD-10-CM | POA: Diagnosis present

## 2023-02-19 DIAGNOSIS — R54 Age-related physical debility: Secondary | ICD-10-CM | POA: Diagnosis present

## 2023-02-19 DIAGNOSIS — J1100 Influenza due to unidentified influenza virus with unspecified type of pneumonia: Secondary | ICD-10-CM | POA: Diagnosis present

## 2023-02-19 DIAGNOSIS — G4700 Insomnia, unspecified: Secondary | ICD-10-CM | POA: Diagnosis present

## 2023-02-19 DIAGNOSIS — B961 Klebsiella pneumoniae [K. pneumoniae] as the cause of diseases classified elsewhere: Secondary | ICD-10-CM | POA: Diagnosis present

## 2023-02-19 DIAGNOSIS — Z882 Allergy status to sulfonamides status: Secondary | ICD-10-CM

## 2023-02-19 DIAGNOSIS — E1122 Type 2 diabetes mellitus with diabetic chronic kidney disease: Secondary | ICD-10-CM | POA: Diagnosis present

## 2023-02-19 DIAGNOSIS — E1151 Type 2 diabetes mellitus with diabetic peripheral angiopathy without gangrene: Secondary | ICD-10-CM | POA: Diagnosis present

## 2023-02-19 DIAGNOSIS — Z888 Allergy status to other drugs, medicaments and biological substances status: Secondary | ICD-10-CM

## 2023-02-19 DIAGNOSIS — J9601 Acute respiratory failure with hypoxia: Secondary | ICD-10-CM | POA: Diagnosis present

## 2023-02-19 DIAGNOSIS — J189 Pneumonia, unspecified organism: Secondary | ICD-10-CM | POA: Diagnosis not present

## 2023-02-19 DIAGNOSIS — M810 Age-related osteoporosis without current pathological fracture: Secondary | ICD-10-CM | POA: Diagnosis present

## 2023-02-19 DIAGNOSIS — Z8042 Family history of malignant neoplasm of prostate: Secondary | ICD-10-CM

## 2023-02-19 DIAGNOSIS — E860 Dehydration: Secondary | ICD-10-CM | POA: Diagnosis present

## 2023-02-19 DIAGNOSIS — R112 Nausea with vomiting, unspecified: Principal | ICD-10-CM

## 2023-02-19 DIAGNOSIS — Z800 Family history of malignant neoplasm of digestive organs: Secondary | ICD-10-CM

## 2023-02-19 DIAGNOSIS — Z1152 Encounter for screening for COVID-19: Secondary | ICD-10-CM

## 2023-02-19 DIAGNOSIS — Z9889 Other specified postprocedural states: Secondary | ICD-10-CM

## 2023-02-19 DIAGNOSIS — K219 Gastro-esophageal reflux disease without esophagitis: Secondary | ICD-10-CM | POA: Diagnosis present

## 2023-02-19 DIAGNOSIS — N1831 Chronic kidney disease, stage 3a: Secondary | ICD-10-CM | POA: Diagnosis present

## 2023-02-19 DIAGNOSIS — E1140 Type 2 diabetes mellitus with diabetic neuropathy, unspecified: Secondary | ICD-10-CM | POA: Diagnosis present

## 2023-02-19 DIAGNOSIS — R1110 Vomiting, unspecified: Secondary | ICD-10-CM

## 2023-02-19 DIAGNOSIS — I10 Essential (primary) hypertension: Secondary | ICD-10-CM | POA: Diagnosis present

## 2023-02-19 DIAGNOSIS — Z90710 Acquired absence of both cervix and uterus: Secondary | ICD-10-CM

## 2023-02-19 DIAGNOSIS — I2510 Atherosclerotic heart disease of native coronary artery without angina pectoris: Secondary | ICD-10-CM | POA: Diagnosis present

## 2023-02-19 DIAGNOSIS — Z885 Allergy status to narcotic agent status: Secondary | ICD-10-CM

## 2023-02-19 DIAGNOSIS — Z7982 Long term (current) use of aspirin: Secondary | ICD-10-CM

## 2023-02-19 DIAGNOSIS — E785 Hyperlipidemia, unspecified: Secondary | ICD-10-CM | POA: Diagnosis present

## 2023-02-19 DIAGNOSIS — E119 Type 2 diabetes mellitus without complications: Secondary | ICD-10-CM

## 2023-02-19 DIAGNOSIS — Z833 Family history of diabetes mellitus: Secondary | ICD-10-CM

## 2023-02-19 DIAGNOSIS — Z79899 Other long term (current) drug therapy: Secondary | ICD-10-CM

## 2023-02-19 DIAGNOSIS — N390 Urinary tract infection, site not specified: Secondary | ICD-10-CM | POA: Diagnosis present

## 2023-02-19 DIAGNOSIS — Z801 Family history of malignant neoplasm of trachea, bronchus and lung: Secondary | ICD-10-CM

## 2023-02-19 DIAGNOSIS — I739 Peripheral vascular disease, unspecified: Secondary | ICD-10-CM | POA: Diagnosis present

## 2023-02-19 DIAGNOSIS — Z7902 Long term (current) use of antithrombotics/antiplatelets: Secondary | ICD-10-CM

## 2023-02-19 DIAGNOSIS — T50905A Adverse effect of unspecified drugs, medicaments and biological substances, initial encounter: Secondary | ICD-10-CM

## 2023-02-19 DIAGNOSIS — F419 Anxiety disorder, unspecified: Secondary | ICD-10-CM | POA: Diagnosis present

## 2023-02-19 LAB — CBC WITH DIFFERENTIAL/PLATELET
Abs Immature Granulocytes: 0.02 10*3/uL (ref 0.00–0.07)
Basophils Absolute: 0 10*3/uL (ref 0.0–0.1)
Basophils Relative: 0 %
Eosinophils Absolute: 0.1 10*3/uL (ref 0.0–0.5)
Eosinophils Relative: 1 %
HCT: 43.6 % (ref 36.0–46.0)
Hemoglobin: 14 g/dL (ref 12.0–15.0)
Immature Granulocytes: 0 %
Lymphocytes Relative: 2 %
Lymphs Abs: 0.2 10*3/uL — ABNORMAL LOW (ref 0.7–4.0)
MCH: 30.7 pg (ref 26.0–34.0)
MCHC: 32.1 g/dL (ref 30.0–36.0)
MCV: 95.6 fL (ref 80.0–100.0)
Monocytes Absolute: 0.6 10*3/uL (ref 0.1–1.0)
Monocytes Relative: 5 %
Neutro Abs: 10.3 10*3/uL — ABNORMAL HIGH (ref 1.7–7.7)
Neutrophils Relative %: 92 %
Platelets: 251 10*3/uL (ref 150–400)
RBC: 4.56 MIL/uL (ref 3.87–5.11)
RDW: 13.2 % (ref 11.5–15.5)
WBC: 11.2 10*3/uL — ABNORMAL HIGH (ref 4.0–10.5)
nRBC: 0 % (ref 0.0–0.2)

## 2023-02-19 LAB — COMPREHENSIVE METABOLIC PANEL
ALT: 16 U/L (ref 0–44)
AST: 24 U/L (ref 15–41)
Albumin: 4.3 g/dL (ref 3.5–5.0)
Alkaline Phosphatase: 73 U/L (ref 38–126)
Anion gap: 13 (ref 5–15)
BUN: 24 mg/dL — ABNORMAL HIGH (ref 8–23)
CO2: 20 mmol/L — ABNORMAL LOW (ref 22–32)
Calcium: 9.7 mg/dL (ref 8.9–10.3)
Chloride: 105 mmol/L (ref 98–111)
Creatinine, Ser: 1.11 mg/dL — ABNORMAL HIGH (ref 0.44–1.00)
GFR, Estimated: 48 mL/min — ABNORMAL LOW (ref >60)
Glucose, Bld: 134 mg/dL — ABNORMAL HIGH (ref 70–99)
Potassium: 3.7 mmol/L (ref 3.5–5.1)
Sodium: 138 mmol/L (ref 135–145)
Total Bilirubin: 0.6 mg/dL (ref 0.3–1.2)
Total Protein: 7.2 g/dL (ref 6.5–8.1)

## 2023-02-19 LAB — LIPASE, BLOOD: Lipase: 111 U/L — ABNORMAL HIGH (ref 11–51)

## 2023-02-19 MED ORDER — CIPROFLOXACIN HCL 500 MG PO TABS
250.0000 mg | ORAL_TABLET | Freq: Once | ORAL | Status: AC
  Administered 2023-02-19: 250 mg via ORAL
  Filled 2023-02-19: qty 1

## 2023-02-19 MED ORDER — SODIUM CHLORIDE 0.9 % IV BOLUS
1000.0000 mL | Freq: Once | INTRAVENOUS | Status: AC
  Administered 2023-02-19: 1000 mL via INTRAVENOUS

## 2023-02-19 MED ORDER — CIPROFLOXACIN HCL 250 MG PO TABS: 250.0000 mg | ORAL_TABLET | Freq: Two times a day (BID) | ORAL | 0 refills | Status: DC

## 2023-02-19 MED ORDER — ONDANSETRON 4 MG PO TBDP
4.0000 mg | ORAL_TABLET | Freq: Once | ORAL | Status: AC
  Administered 2023-02-19: 4 mg via ORAL
  Filled 2023-02-19: qty 1

## 2023-02-19 MED ORDER — ONDANSETRON HCL 4 MG PO TABS: 4.0000 mg | ORAL_TABLET | Freq: Every day | ORAL | 1 refills | Status: DC | PRN

## 2023-02-19 NOTE — Discharge Instructions (Addendum)
It is likely that your nausea vomiting diarrhea was a reaction to the new medication you started taking.  I have switched you to another medication that will work well for urine infection.  Take this for the full 3-day course as prescribed.  Stop taking your other medication (cefadroxil.)  Drink plenty of fluids to stay well-hydrated.  Find Pedialyte or similar electrolyte rehydration formulas at your local pharmacy. Take Zofran as prescribed for nausea.  Call your doctor for a follow-up appointment this week.  Thank you for choosing [place] for your health care today!  Please see your primary doctor this week for a follow up appointment.   If you have any new, worsening, or unexpected symptoms call your doctor right away or come back to the emergency department for reevaluation.  It was my pleasure to care for you today.   Daneil Dan Modesto Charon, MD

## 2023-02-19 NOTE — ED Provider Notes (Addendum)
-----------------------------------------   11:07 PM on 02/19/2023 -----------------------------------------  Blood pressure (!) 156/60, pulse 86, temperature 98.5 F (36.9 C), temperature source Oral, resp. rate (!) 22, height 5\' 7"  (1.702 m), weight 63 kg, SpO2 90%.  Assuming care from Dr. Modesto Charon.  In short, Dawn Mcintyre is a 89 y.o. female with a chief complaint of Emesis .  Refer to the original H&P for additional details.  The current plan of care is to reassess and PO challenge.  ----------------------------------------- 12:32 AM on 02/20/2023 ----------------------------------------- Patient reports feeling better following nausea medication and is requesting to be discharged home.  She is tolerating oral intake without difficulty and workup has been reassuring with renal function stable compared to previous, lipase also elevated but similar compared to previous.  Patient did briefly drop oxygen saturations while sleeping, however this quickly improves when she is awake.  Patient has been prescribed Zofran and Cipro for her UTI by Dr. Modesto Charon, she was counseled to follow-up with her PCP and return to the ED for new or worsening symptoms.  Patient agrees with plan.  ----------------------------------------- 1:01 AM on 02/20/2023 ----------------------------------------- Prior to discharge, patient noted to be more persistently hypoxic, also appears more somnolent than my previous evaluation.  She has a nonfocal neurologic exam but we will check CT head.  Will also check EKG and chest x-ray, add on troponin to previous labs.  Change in mental status could be due to UTI as patient had a culture from 7 days ago that grew Klebsiella.  We will give dose of IV Rocephin to cover for UTI as well as possible pneumonia.  ----------------------------------------- 2:12 AM on 02/20/2023 ----------------------------------------- EKG shows no evidence of arrhythmia or ischemia, troponin within normal  limits.  Chest x-ray does show likely pneumonia with small left-sided effusion.  We will add on azithromycin in addition to Rocephin.  CT head is negative for acute process.  Case discussed with hospitalist for admission.   Chesley Noon, MD 02/20/23 [phone]

## 2023-02-19 NOTE — ED Triage Notes (Signed)
Pt presents to ER via ems from home with c/o n/v that started this morning after pt started some cefadroxil that she was rx for UTI.  Pt states shortly after, she began to vomit.  Pt reports she at a few hours before she took the meds.  Pt states she has vomited 10+ times today.  Denies any abd pain, but states she has had some diarrhea.  Took some imodium at home pta.  Pt otherwise A&O x4 and in NAD.

## 2023-02-19 NOTE — ED Provider Notes (Signed)
[facility] Provider Note    Event Date/Time   First MD Initiated Contact with Patient 02/19/23 2157     (approximate)   History   Emesis   HPI  Dawn Mcintyre is a 89 y.o. female   Past medical history of GERD, hypertension, hyperlipidemia, recently diagnosed urinary tract infection and started on cefadroxil today and subsequently developed nausea vomiting and diarrhea after taking this medication.  She is otherwise been in her regular state of health and feeling well except for some dysuria.  Denies fevers or chills.  Denies abdominal pain.  No GI bleeding.  Aside from the nausea vomiting and diarrhea she has no other adverse effects after taking medication including skin rash, itching, respiratory symptoms.   Independent Historian contributed to assessment above: Husband is at bedside to corroborate information given above  External Medical Documents Reviewed: Telephone encounter dated 02/17/2023 noting small amount of bacteria in the urine, treating with cefadroxil.      Physical Exam   Triage Vital Signs: ED Triage Vitals  Encounter Vitals Group     BP 02/19/23 2026 (!) 149/74     Systolic BP Percentile --      Diastolic BP Percentile --      Pulse Rate 02/19/23 2026 80     Resp 02/19/23 2026 18     Temp 02/19/23 2026 98.5 F (36.9 C)     Temp Source 02/19/23 2026 Oral     SpO2 02/19/23 2026 98 %     Weight 02/19/23 2027 139 lb (63 kg)     Height 02/19/23 2027 5\' 7"  (1.702 m)     Head Circumference --      Peak Flow --      Pain Score 02/19/23 2027 0     Pain Loc --      Pain Education --      Exclude from Growth Chart --     Most recent vital signs: Vitals:   02/19/23 2200 02/19/23 2230  BP: (!) 165/54 (!) 156/60  Pulse: 83 86  Resp: (!) 28 (!) 22  Temp:    SpO2: 92% 90%    General: Awake, no distress.  CV:  Good peripheral perfusion.  Resp:  Normal effort.  Abd:  No distention.  Other:  .Slightly dehydrated with dry  mucous membranes.  Otherwise nontoxic-appearing and afebrile with a soft nontender abdomen to deep palpation in all quadrants.   ED Results / Procedures / Treatments   Labs (all labs ordered are listed, but only abnormal results are displayed) Labs Reviewed  CBC WITH DIFFERENTIAL/PLATELET - Abnormal; Notable for the following components:      Result Value   WBC 11.2 (*)    Neutro Abs 10.3 (*)    Lymphs Abs 0.2 (*)    All other components within normal limits  COMPREHENSIVE METABOLIC PANEL - Abnormal; Notable for the following components:   CO2 20 (*)    Glucose, Bld 134 (*)    BUN 24 (*)    Creatinine, Ser 1.11 (*)    GFR, Estimated 48 (*)    All other components within normal limits  LIPASE, BLOOD - Abnormal; Notable for the following components:   Lipase 111 (*)    All other components within normal limits  URINALYSIS, ROUTINE W REFLEX MICROSCOPIC     I ordered and reviewed the above labs they are notable for mild leukocytosis 11.2.  Creatinine is 1.11 and her electrolytes are within normal limits otherwise.  Her lipase is 111 and she has a similar elevation noted last year on lab testing.  PROCEDURES:  Critical Care performed: No  Procedures   MEDICATIONS ORDERED IN ED: Medications  ondansetron (ZOFRAN-ODT) disintegrating tablet 4 mg (4 mg Oral Given 02/19/23 2032)  sodium chloride 0.9 % bolus 1,000 mL (1,000 mLs Intravenous New Bag/Given 02/19/23 2216)  ciprofloxacin (CIPRO) tablet 250 mg (250 mg Oral Given 02/19/23 2243)    IMPRESSION / MDM / ASSESSMENT AND PLAN / ED COURSE  I reviewed the triage vital signs and the nursing notes.                                Patient's presentation is most consistent with acute presentation with potential threat to life or bodily function.  Differential diagnosis includes, but is not limited to, adverse reaction to medication, allergic reaction, gastroenteritis, intra-abdominal infection, dehydration electrolyte disturbance,  urinary tract infection or sepsis   The patient is on the cardiac monitor to evaluate for evidence of arrhythmia and/or significant heart rate changes.  MDM:    Patient developed nausea vomiting and diarrhea after taking first dose of cefadroxil, has otherwise been in her regular state of health except for some dysuria attributed to her urinary tract infection.  Considered adverse effect of medication versus gastroenteritis.  Will switch her to ciprofloxacin after review of most recent urine culture sensitivities Klebsiella UTI sensitive to ciprofloxacin.  In terms of her nausea vomiting diarrhea, I doubt intra-abdominal infection given soft benign abdominal exam.  She looks dehydrated and I will give her Zofran for nausea and IV crystalloid bolus.  She is intent on going home.  Will p.o. challenge and ambulate and if she is well is well enough will discharge with a prescription for Zofran and ciprofloxacin and she will follow-up with PMD.       FINAL CLINICAL IMPRESSION(S) / ED DIAGNOSES   Final diagnoses:  Nausea vomiting and diarrhea  Adverse drug interaction with prescription medication     Rx / DC Orders   ED Discharge Orders          Ordered    ciprofloxacin (CIPRO) 250 MG tablet  2 times daily        02/19/23 2310    ondansetron (ZOFRAN) 4 MG tablet  Daily PRN        02/19/23 2310             Note:  This document was prepared using Dragon voice recognition software and may include unintentional dictation errors.    Pilar Jarvis, MD 02/19/23 [id number]

## 2023-02-20 ENCOUNTER — Emergency Department: Payer: Medicare Other

## 2023-02-20 DIAGNOSIS — R112 Nausea with vomiting, unspecified: Secondary | ICD-10-CM | POA: Diagnosis not present

## 2023-02-20 DIAGNOSIS — E785 Hyperlipidemia, unspecified: Secondary | ICD-10-CM | POA: Diagnosis present

## 2023-02-20 DIAGNOSIS — N390 Urinary tract infection, site not specified: Secondary | ICD-10-CM | POA: Diagnosis present

## 2023-02-20 DIAGNOSIS — Z800 Family history of malignant neoplasm of digestive organs: Secondary | ICD-10-CM | POA: Diagnosis not present

## 2023-02-20 DIAGNOSIS — Z801 Family history of malignant neoplasm of trachea, bronchus and lung: Secondary | ICD-10-CM | POA: Diagnosis not present

## 2023-02-20 DIAGNOSIS — Z8042 Family history of malignant neoplasm of prostate: Secondary | ICD-10-CM | POA: Diagnosis not present

## 2023-02-20 DIAGNOSIS — N1831 Chronic kidney disease, stage 3a: Secondary | ICD-10-CM | POA: Diagnosis present

## 2023-02-20 DIAGNOSIS — I129 Hypertensive chronic kidney disease with stage 1 through stage 4 chronic kidney disease, or unspecified chronic kidney disease: Secondary | ICD-10-CM | POA: Diagnosis present

## 2023-02-20 DIAGNOSIS — R197 Diarrhea, unspecified: Secondary | ICD-10-CM

## 2023-02-20 DIAGNOSIS — I2510 Atherosclerotic heart disease of native coronary artery without angina pectoris: Secondary | ICD-10-CM | POA: Diagnosis present

## 2023-02-20 DIAGNOSIS — J1100 Influenza due to unidentified influenza virus with unspecified type of pneumonia: Secondary | ICD-10-CM | POA: Diagnosis present

## 2023-02-20 DIAGNOSIS — G4700 Insomnia, unspecified: Secondary | ICD-10-CM | POA: Diagnosis present

## 2023-02-20 DIAGNOSIS — J189 Pneumonia, unspecified organism: Secondary | ICD-10-CM | POA: Diagnosis present

## 2023-02-20 DIAGNOSIS — B961 Klebsiella pneumoniae [K. pneumoniae] as the cause of diseases classified elsewhere: Secondary | ICD-10-CM | POA: Diagnosis present

## 2023-02-20 DIAGNOSIS — E1140 Type 2 diabetes mellitus with diabetic neuropathy, unspecified: Secondary | ICD-10-CM | POA: Diagnosis present

## 2023-02-20 DIAGNOSIS — Z882 Allergy status to sulfonamides status: Secondary | ICD-10-CM | POA: Diagnosis not present

## 2023-02-20 DIAGNOSIS — R1110 Vomiting, unspecified: Secondary | ICD-10-CM

## 2023-02-20 DIAGNOSIS — Z9889 Other specified postprocedural states: Secondary | ICD-10-CM

## 2023-02-20 DIAGNOSIS — Z885 Allergy status to narcotic agent status: Secondary | ICD-10-CM | POA: Diagnosis not present

## 2023-02-20 DIAGNOSIS — E1151 Type 2 diabetes mellitus with diabetic peripheral angiopathy without gangrene: Secondary | ICD-10-CM | POA: Diagnosis present

## 2023-02-20 DIAGNOSIS — Z888 Allergy status to other drugs, medicaments and biological substances status: Secondary | ICD-10-CM | POA: Diagnosis not present

## 2023-02-20 DIAGNOSIS — Z1152 Encounter for screening for COVID-19: Secondary | ICD-10-CM | POA: Diagnosis not present

## 2023-02-20 DIAGNOSIS — J9601 Acute respiratory failure with hypoxia: Secondary | ICD-10-CM | POA: Diagnosis present

## 2023-02-20 DIAGNOSIS — Z90710 Acquired absence of both cervix and uterus: Secondary | ICD-10-CM | POA: Diagnosis not present

## 2023-02-20 DIAGNOSIS — E860 Dehydration: Secondary | ICD-10-CM | POA: Diagnosis present

## 2023-02-20 DIAGNOSIS — M810 Age-related osteoporosis without current pathological fracture: Secondary | ICD-10-CM | POA: Diagnosis present

## 2023-02-20 DIAGNOSIS — K529 Noninfective gastroenteritis and colitis, unspecified: Secondary | ICD-10-CM | POA: Diagnosis present

## 2023-02-20 DIAGNOSIS — F419 Anxiety disorder, unspecified: Secondary | ICD-10-CM | POA: Diagnosis present

## 2023-02-20 DIAGNOSIS — E1122 Type 2 diabetes mellitus with diabetic chronic kidney disease: Secondary | ICD-10-CM | POA: Diagnosis present

## 2023-02-20 LAB — CBC
HCT: 38.4 % (ref 36.0–46.0)
Hemoglobin: 12.4 g/dL (ref 12.0–15.0)
MCH: 30.7 pg (ref 26.0–34.0)
MCHC: 32.3 g/dL (ref 30.0–36.0)
MCV: 95 fL (ref 80.0–100.0)
Platelets: 197 10*3/uL (ref 150–400)
RBC: 4.04 MIL/uL (ref 3.87–5.11)
RDW: 13.3 % (ref 11.5–15.5)
WBC: 9.2 10*3/uL (ref 4.0–10.5)
nRBC: 0 % (ref 0.0–0.2)

## 2023-02-20 LAB — URINALYSIS, ROUTINE W REFLEX MICROSCOPIC
Bilirubin Urine: NEGATIVE
Glucose, UA: NEGATIVE mg/dL
Hgb urine dipstick: NEGATIVE
Ketones, ur: NEGATIVE mg/dL
Leukocytes,Ua: NEGATIVE
Nitrite: NEGATIVE
Protein, ur: 100 mg/dL — AB
Specific Gravity, Urine: 1.021 (ref 1.005–1.030)
pH: 5 (ref 5.0–8.0)

## 2023-02-20 LAB — RESP PANEL BY RT-PCR (RSV, FLU A&B, COVID)  RVPGX2
Influenza A by PCR: NEGATIVE
Influenza B by PCR: NEGATIVE
Resp Syncytial Virus by PCR: NEGATIVE
SARS Coronavirus 2 by RT PCR: NEGATIVE

## 2023-02-20 LAB — TROPONIN I (HIGH SENSITIVITY): Troponin I (High Sensitivity): 7 ng/L (ref <18)

## 2023-02-20 LAB — CREATININE, SERUM
Creatinine, Ser: 0.84 mg/dL (ref 0.44–1.00)
GFR, Estimated: >60 mL/min (ref >60)

## 2023-02-20 MED ORDER — ASPIRIN 81 MG PO TBEC
81.0000 mg | DELAYED_RELEASE_TABLET | Freq: Every day | ORAL | Status: DC
  Administered 2023-02-20 – 2023-02-21 (×2): 81 mg via ORAL
  Filled 2023-02-20 (×2): qty 1

## 2023-02-20 MED ORDER — SODIUM CHLORIDE 0.9 % IV SOLN: INTRAVENOUS | Status: DC

## 2023-02-20 MED ORDER — ALBUTEROL SULFATE (2.5 MG/3ML) 0.083% IN NEBU: 2.5000 mg | INHALATION_SOLUTION | RESPIRATORY_TRACT | Status: DC | PRN

## 2023-02-20 MED ORDER — SODIUM CHLORIDE 0.9 % IV SOLN
500.0000 mg | Freq: Once | INTRAVENOUS | Status: AC
  Administered 2023-02-20: 500 mg via INTRAVENOUS
  Filled 2023-02-20: qty 5

## 2023-02-20 MED ORDER — SODIUM CHLORIDE 0.9 % IV SOLN
1.0000 g | Freq: Once | INTRAVENOUS | Status: AC
  Administered 2023-02-20: 1 g via INTRAVENOUS
  Filled 2023-02-20: qty 10

## 2023-02-20 MED ORDER — ATORVASTATIN CALCIUM 20 MG PO TABS
10.0000 mg | ORAL_TABLET | Freq: Every day | ORAL | Status: DC
  Administered 2023-02-20 – 2023-02-21 (×2): 10 mg via ORAL
  Filled 2023-02-20 (×2): qty 1

## 2023-02-20 MED ORDER — ONDANSETRON HCL 4 MG/2ML IJ SOLN: 4.0000 mg | Freq: Four times a day (QID) | INTRAMUSCULAR | Status: DC | PRN

## 2023-02-20 MED ORDER — ENOXAPARIN SODIUM 40 MG/0.4ML IJ SOSY
40.0000 mg | PREFILLED_SYRINGE | INTRAMUSCULAR | Status: DC
  Administered 2023-02-20 – 2023-02-21 (×2): 40 mg via SUBCUTANEOUS
  Filled 2023-02-20 (×2): qty 0.4

## 2023-02-20 MED ORDER — ACETAMINOPHEN 650 MG RE SUPP: 650.0000 mg | Freq: Four times a day (QID) | RECTAL | Status: DC | PRN

## 2023-02-20 MED ORDER — SODIUM CHLORIDE 0.9 % IV SOLN
500.0000 mg | INTRAVENOUS | Status: DC
  Administered 2023-02-21: 500 mg via INTRAVENOUS
  Filled 2023-02-20: qty 5

## 2023-02-20 MED ORDER — ACETAMINOPHEN 325 MG PO TABS: 650.0000 mg | ORAL_TABLET | Freq: Four times a day (QID) | ORAL | Status: DC | PRN

## 2023-02-20 MED ORDER — ONDANSETRON HCL 4 MG PO TABS: 4.0000 mg | ORAL_TABLET | Freq: Four times a day (QID) | ORAL | Status: DC | PRN

## 2023-02-20 MED ORDER — SODIUM CHLORIDE 0.9 % IV SOLN
2.0000 g | INTRAVENOUS | Status: DC
  Administered 2023-02-21: 2 g via INTRAVENOUS
  Filled 2023-02-20 (×2): qty 20

## 2023-02-20 NOTE — Progress Notes (Signed)
Interval events noted.  She complains of tightness in the right lower chest/right upper abdomen which occurred after eating breakfast.  No cough, shortness of breath, vomiting or diarrhea.  She said she vomited about 6 times yesterday.  This occurred after she ate a chicken sandwich from [employer] yesterday afternoon.  She also had 4 episodes of watery diarrhea yesterday afternoon.  Vomiting and diarrhea appear to have resolved.  Continue IV ceftriaxone azithromycin for suspected pneumonia.  Oxygen therapy has been weaned down from 2 L to 1 L/min.  Wean off oxygen as able.  Possible discharge tomorrow if she continues to improve.

## 2023-02-20 NOTE — Assessment & Plan Note (Signed)
Possible infectious etiology.  Appears resolved COVID-negative Supportive care with IV hydration, IV antiemetics

## 2023-02-20 NOTE — Assessment & Plan Note (Signed)
Renal function at baseline 

## 2023-02-20 NOTE — H&P (Addendum)
History and Physical    Patient: Dawn Mcintyre DOB: Mar 08, 1933 DOA: 02/19/2023 DOS: the patient was seen and examined on 02/20/2023 PCP: Marguarite Arbour, MD  Patient coming from: Home  Chief Complaint:  Chief Complaint  Patient presents with   Emesis    HPI: Dawn Mcintyre is a 89 y.o. female with medical history significant for HTN, HLD, carotid stenosis s/p b/l CEA's, PAD, CAD, diet-controlled type 2 diabetes, neuropathy, CKD 3a, anxiety, insomnia who presents to the ED for evaluation of vomiting and diarrhea.  Patient was recently diagnosed with Klebsiella pneumonia UTI after developing lower abdominal pain on 7/11.  She was prescribed cefadroxil which she started taking 2 days ago.  The following day she developed vomiting and diarrhea and presented to the ED. she had several episodes of vomiting.  Stool was nonbloody and nonblack.  She denied fever or chills, abdominal pain.. ED course and data review: Vitals were within normal limits, however during workup in the ED she was noted to be lethargic with O2 sats of 88%, improving to mid 90s with O2 at 2 L.  Labs were notable for WBC 11.2, lipase 111 with normal LFTs and baseline creatinine of 1.1.  Urinalysis was unremarkable and respiratory viral panel negative. EKG, personally viewed and interpreted showed sinus rhythm at 86. CT head was nonacute Chest x-ray showed left greater than right atelectasis or pneumonia and small left pleural effusion. Patient started on Rocephin and azithromycin. She was also treated with an IV fluid bolus and Zofran Hospitalist consulted for admission for pneumonia with hypoxia.   Review of Systems: As mentioned in the history of present illness. All other systems reviewed and are negative.  Past Medical History:  Diagnosis Date   Arteritis (HCC)    Arthritis    Atrophic vaginitis 11/16/2014   Carotid artery stenosis    Cataract    Difficult intubation    Environmental and seasonal  allergies    GERD (gastroesophageal reflux disease)    Hyperlipidemia    Hypertension    Lack of bladder control    Neuropathy    Osteoporosis    Pneumonia     x 35yrs ago   Pre-diabetes    Shingles    Ulcer    Past Surgical History:  Procedure Laterality Date   ABDOMINAL HYSTERECTOMY  1972   BREAST CYST EXCISION Right 1972   BREAST CYST EXCISION Left 1972   ENDARTERECTOMY Left 12/24/2017   Procedure: ENDARTERECTOMY CAROTID;  Surgeon: Renford Dills, MD;  Location: [facility] ORS;  Service: Vascular;  Laterality: Left;   ENDARTERECTOMY Right 03/13/2018   Procedure: ENDARTERECTOMY CAROTID;  Surgeon: Renford Dills, MD;  Location: [facility] ORS;  Service: Vascular;  Laterality: Right;   ESOPHAGOGASTRODUODENOSCOPY (EGD) WITH PROPOFOL N/A 11/28/2020   Procedure: ESOPHAGOGASTRODUODENOSCOPY (EGD) WITH PROPOFOL;  Surgeon: Midge Minium, MD;  Location: [facility] ENDOSCOPY;  Service: Endoscopy;  Laterality: N/A;   EYE SURGERY     RENAL ANGIOGRAPHY N/A 03/05/2022   Procedure: RENAL ANGIOGRAPHY;  Surgeon: Renford Dills, MD;  Location: [facility] INV[facility]IVE CV [facility];  Service: Cardiovascular;  Laterality: N/A;   THROAT SURGERY     x 10 yrs ago. throat mass.   TONSILLECTOMY     Social History:  reports that she has never smoked. She has never been exposed to tobacco smoke. She has never used smokeless tobacco. She reports that she does not drink alcohol and does not use drugs.  Allergies  Allergen Reactions   Codeine Hives  Hydrocodone Nausea Only   Metronidazole Nausea Only   Propoxyphene Nausea Only   Sulfa Antibiotics Hives   Amoxicillin-Pot Clavulanate Diarrhea   Latex Rash    Family History  Problem Relation Age of Onset   Anuerysm Mother    Prostate cancer Father    Esophageal cancer Sister    Lung cancer Sister    Diabetes Brother    Hearing loss Brother    Bladder Cancer Neg Hx    Kidney cancer Neg Hx    Breast cancer Neg Hx     Prior to Admission medications   Medication Sig Start  Date End Date Taking? Authorizing Provider  ciprofloxacin (CIPRO) 250 MG tablet Take 1 tablet (250 mg total) by mouth 2 (two) times daily for 3 days. 02/19/23 02/22/23 Yes Pilar Jarvis, MD  ondansetron [facility]) 4 MG tablet Take 1 tablet (4 mg total) by mouth daily as needed for nausea or vomiting. 02/19/23 02/19/24 Yes Pilar Jarvis, MD  acetaminophen (TYLENOL) 500 MG tablet Take 500 mg by mouth daily as needed for headache.    [provider]  ALPRAZolam Prudy Feeler) 0.25 MG tablet Take 0.25 mg by mouth at bedtime as needed for sleep.    [provider]  ascorbic acid (VITAMIN C) 1000 MG tablet Take 1,000 mg by mouth daily.    [provider]  aspirin 81 MG tablet Take 81 mg by mouth daily.    [provider]  atorvastatin (LIPITOR) 10 MG tablet Take 1 tablet (10 mg total) by mouth daily. 03/05/22 03/05/23  Schnier, Latina Craver, MD  Cholecalciferol (VITAMIN D3) 1.25 MG (50000 UT) CAPS Take 1 capsule by mouth daily.    [provider]  clopidogrel (PLAVIX) 75 MG tablet TAKE 1 TABLET BY MOUTH EVERY DAY 01/22/23   Schnier, Latina Craver, MD  conjugated estrogens (PREMARIN) vaginal cream Apply one pea-sized amount around the opening of the urethra three times weekly. 10/19/20   Vaillancourt, Lelon Mast, PA-C  felodipine (PLENDIL) 2.5 MG 24 hr tablet Take 2.5 mg by mouth daily. May take a second 2.5 mg dose as needed if bp is over 150/90    [provider]  fexofenadine (ALLEGRA) 180 MG tablet Take 180 mg by mouth daily as needed for allergies.     [provider]  fluconazole (DIFLUCAN) 150 MG tablet Take 1 tablet (150 mg total) by mouth every 3 (three) days. For three doses 04/09/22   Anyanwu, Jethro Bastos, MD  fluticasone (FLONASE) 50 MCG/ACT nasal spray Place into both nostrils. 05/31/22   [provider]  gabapentin (NEURONTIN) 100 MG capsule Take 100 mg by mouth 3 (three) times daily. 08/08/21   [provider]  hydrALAZINE (APRESOLINE) 25 MG tablet  Take 25 mg by mouth 3 (three) times daily. 02/05/22   [provider]  hydrochlorothiazide (HYDRODIURIL) 12.5 MG tablet Take 1 tablet by mouth daily. 06/15/20   [provider]  hydrOXYzine (ATARAX) 25 MG tablet Take 25 mg by mouth daily. 06/12/22   [provider]  isosorbide mononitrate (IMDUR) 60 MG 24 hr tablet Take 60 mg by mouth daily.    [provider]  losartan (COZAAR) 100 MG tablet Take 100 mg by mouth daily.    [provider]  metoprolol tartrate (LOPRESSOR) 100 MG tablet Take 100 mg by mouth 2 (two) times daily. 09/27/20   [provider]  metroNIDAZOLE (METROGEL) 0.75 % vaginal gel Place vaginally at bedtime. 10/16/21   [provider]  mirabegron ER (MYRBETRIQ) 25  MG TB24 tablet Take 25 mg by mouth daily.    [provider]  nystatin cream (MYCOSTATIN) Apply 1 Application topically 2 (two) times daily. 04/09/22   Anyanwu, Jethro Bastos, MD  pantoprazole (PROTONIX) 40 MG tablet Take 1 tablet (40 mg total) by mouth 2 (two) times daily before a meal. 11/28/20 03/05/22  Delfino Lovett, MD  predniSONE (DELTASONE) 2.5 MG tablet Take 2.5 mg by mouth daily. 09/26/21   [provider]  vitamin E 180 MG (400 UNITS) capsule Take 400 Units by mouth daily.    [provider]    Physical Exam: Vitals:   02/19/23 2230 02/19/23 2300 02/19/23 2330 02/20/23 0218  BP: (!) 156/60 (!) 163/59 (!) 158/67   Pulse: 86 83 85   Resp: (!) 22 (!) 22 19   Temp:    98.6 F (37 C)  TempSrc:    Oral  SpO2: 90% 92% 93%   Weight:      Height:       Physical Exam Vitals and nursing note reviewed.  Constitutional:      General: She is not in acute distress.    Comments: Frail-appearing elderly female but alert and oriented  HENT:     Head: Normocephalic and atraumatic.  Cardiovascular:     Rate and Rhythm: Normal rate and regular rhythm.     Heart sounds: Normal heart sounds.  Pulmonary:     Effort: Pulmonary effort is normal.      Breath sounds: Normal breath sounds.  Abdominal:     Palpations: Abdomen is soft.     Tenderness: There is no abdominal tenderness.  Neurological:     Mental Status: Mental status is at baseline.     Labs on Admission: I have personally reviewed following labs and imaging studies  CBC: Recent Labs  Lab 02/19/23 2029  WBC 11.2*  NEUTROABS 10.3*  HGB 14.0  HCT 43.6  MCV 95.6  PLT 251   Basic Metabolic Panel: Recent Labs  Lab 02/19/23 2029  NA 138  K 3.7  CL 105  CO2 20*  GLUCOSE 134*  BUN 24*  CREATININE 1.11*  CALCIUM 9.7   GFR: Estimated Creatinine Clearance: 34.1 mL/min (A) (by C-G formula based on SCr of 1.11 mg/dL (H)). Liver Function Tests: Recent Labs  Lab 02/19/23 2029  AST 24  ALT 16  ALKPHOS 73  BILITOT 0.6  PROT 7.2  ALBUMIN 4.3   Recent Labs  Lab 02/19/23 2029  LIPASE 111*   No results for input(s): "AMMONIA" in the last 168 hours. Coagulation Profile: No results for input(s): "INR", "PROTIME" in the last 168 hours. Cardiac Enzymes: No results for input(s): "CKTOTAL", "CKMB", "CKMBINDEX", "TROPONINI" in the last 168 hours. BNP (last 3 results) No results for input(s): "PROBNP" in the last 8760 hours. HbA1C: No results for input(s): "HGBA1C" in the last 72 hours. CBG: No results for input(s): "GLUCAP" in the last 168 hours. Lipid Profile: No results for input(s): "CHOL", "HDL", "LDLCALC", "TRIG", "CHOLHDL", "LDLDIRECT" in the last 72 hours. Thyroid Function Tests: No results for input(s): "TSH", "T4TOTAL", "FREET4", "T3FREE", "THYROIDAB" in the last 72 hours. Anemia Panel: No results for input(s): "VITAMINB12", "FOLATE", "FERRITIN", "TIBC", "IRON", "RETICCTPCT" in the last 72 hours. Urine analysis:    Component Value Date/Time   COLORURINE YELLOW (A) 02/19/2023 2029   APPEARANCEUR CLEAR (A) 02/19/2023 2029   APPEARANCEUR Clear 11/18/2022 1118   LABSPEC 1.021 02/19/2023 2029   LABSPEC 1.026 09/12/2011 2304   PHURINE 5.0  02/19/2023 2029  GLUCOSEU NEGATIVE 02/19/2023 2029   GLUCOSEU Negative 09/12/2011 2304   HGBUR NEGATIVE 02/19/2023 2029   BILIRUBINUR NEGATIVE 02/19/2023 2029   BILIRUBINUR Negative 11/18/2022 1118   BILIRUBINUR Negative 09/12/2011 2304   KETONESUR NEGATIVE 02/19/2023 2029   PROTEINUR 100 (A) 02/19/2023 2029   UROBILINOGEN 0.2 04/12/2021 1144   NITRITE NEGATIVE 02/19/2023 2029   LEUKOCYTESUR NEGATIVE 02/19/2023 2029   LEUKOCYTESUR 2+ 09/12/2011 2304    Radiological Exams on Admission: CT Head Wo Contrast  Result Date: 02/20/2023 CLINICAL DATA:  Altered mental status EXAM: CT HEAD WITHOUT CONTRAST TECHNIQUE: Contiguous axial images were obtained from the base of the skull through the vertex without intravenous contrast. RADIATION DOSE REDUCTION: This exam was performed according to the departmental dose-optimization program which includes automated exposure control, adjustment of the mA and/or kV according to patient size and/or use of iterative reconstruction technique. COMPARISON:  12/06/2020 FINDINGS: Brain: There is no mass, hemorrhage or extra-axial collection. The size and configuration of the ventricles and extra-axial CSF spaces are normal. There is hypoattenuation of the white matter, most commonly indicating chronic small vessel disease. Vascular: Atherosclerotic calcification of the vertebral and internal carotid arteries at the skull base. No abnormal hyperdensity of the major intracranial arteries or dural venous sinuses. Skull: The visualized skull base, calvarium and extracranial soft tissues are normal. Sinuses/Orbits: No fluid levels or advanced mucosal thickening of the visualized paranasal sinuses. No mastoid or middle ear effusion. The orbits are normal. IMPRESSION: 1. No acute intracranial abnormality. 2. Chronic small vessel disease. Electronically Signed   By: Deatra Robinson M.D.   On: 02/20/2023 01:52   DG Chest 2 View  Result Date: 02/20/2023 CLINICAL DATA:  Shortness  breath EXAM: CHEST - 2 VIEW COMPARISON:  Chest radiograph 06/05/3 FINDINGS: Stable cardiomediastinal silhouette. Aortic atherosclerotic calcification. Left-greater-than-right basilar airspace opacities. Small left no pneumothorax. No displaced rib fractures IMPRESSION: Left-greater-than-right atelectasis or pneumonia. Small left pleural effusion. Electronically Signed   By: Minerva Fester M.D.   On: 02/20/2023 01:28     Data Reviewed: Relevant notes from primary care and specialist visits, past discharge summaries as available in EHR, including Care Everywhere. Prior diagnostic testing as pertinent to current admission diagnoses Updated medications and problem lists for reconciliation ED course, including vitals, labs, imaging, treatment and response to treatment Triage notes, nursing and pharmacy notes and ED provider's notes Notable results as noted in HPI   Assessment and Plan: * PNA (pneumonia) Acute respiratory failure with hypoxia Chest x-ray showing possible pneumonia and patient with desaturations to 88% in the ED Respiratory viral panel negative Continue ceftriaxone and azithromycin Supplemental O2 to keep sats over 94%  Vomiting and diarrhea Possible infectious etiology.  Appears resolved COVID-negative Supportive care with IV hydration, IV antiemetics  UTI due to Klebsiella species Klebsiella pneumonia UTI based on culture from 02/13/2023 Urinalysis appears clean though patient has taken antibiotic for only 1 day Follow urine culture Continue ceftriaxone as above  Chronic kidney disease, stage 3a (HCC) Renal function at baseline  Diet-controlled type 2 diabetes mellitus (HCC) Consistent carbohydrate diet  HTN (hypertension) Continue metoprolol and losartan pending med rec  CAD (coronary artery disease) PAD S/p bilateral carotid endarterectomy Continue metoprolol, atorvastatin, aspirin and isosorbide     DVT prophylaxis: Lovenox  Consults: none  Advance  Care Planning:   Code Status: Prior   Family Communication: cousin at bedside  Disposition Plan: Back to previous home environment  Severity of Illness: The appropriate patient status for this patient is INPATIENT. Inpatient status  is judged to be reasonable and necessary in order to provide the required intensity of service to ensure the patient's safety. The patient's presenting symptoms, physical exam findings, and initial radiographic and laboratory data in the context of their chronic comorbidities is felt to place them at high risk for further clinical deterioration. Furthermore, it is not anticipated that the patient will be medically stable for discharge from the hospital within 2 midnights of admission.   * I certify that at the point of admission it is my clinical judgment that the patient will require inpatient hospital care spanning beyond 2 midnights from the point of admission due to high intensity of service, high risk for further deterioration and high frequency of surveillance required.*  Author: Andris Baumann, MD 02/20/2023 3:02 AM  For on call review [email or web address].

## 2023-02-20 NOTE — ED Notes (Signed)
Upon discharge patient noted to be sleepy and lethargic. O2 sats on RA 88%.  MD aware and O2  applied

## 2023-02-20 NOTE — Progress Notes (Signed)
Transition of Care [facility]) - Inpatient Brief Assessment   Patient Details  Name: Dawn Mcintyre MRN: [id number] Date of Birth: 05/22/1933  [facility] CM/SW Contact:    Garret Reddish, RN Phone Number: 02/20/2023, 12:54 PM   Clinical Narrative:   Chart reviewed.  Noted that patient was admitted with PNA (pneumonia) Acute respiratory failure with hypoxia.  Patient is currently receiving IV Ceftriaxone and Azithromycin.  I have spoken with Dawn Mcintyre.  She reports that prior to admission she lived at home by herself. She reports that she was Independent of ADL's.  She reports that she has assistance that comes out 4 days a week for 3 hours a day to assist with house work, washing clothes, and light house work duties.    Dawn Mcintyre reports that she is still driving and does not require any assistive devices for ambulation.  Dawn Mcintyre reports that she has walker, wheelchair, and cane at home but does not use any assistive devices.  She plans to return home on discharge.   No TOC need identified.      Transition of Care Asessment: Insurance and Status: Insurance coverage has been reviewed Patient has primary care physician: Yes Home environment has been reviewed: Patient lives at home by herself and assistance that comes out 4 times weekly for 3 hours a day Prior level of function:: Independent prior to admission Prior/Current Home Services: No current home services Social Determinants of Health Reivew: SDOH reviewed no interventions necessary Readmission risk has been reviewed: Yes Transition of care needs: no transition of care needs at this time

## 2023-02-20 NOTE — Assessment & Plan Note (Addendum)
PAD S/p bilateral carotid endarterectomy Continue metoprolol, atorvastatin, aspirin and isosorbide

## 2023-02-20 NOTE — Assessment & Plan Note (Signed)
Consistent carbohydrate diet

## 2023-02-20 NOTE — Assessment & Plan Note (Signed)
Klebsiella pneumonia UTI based on culture from 02/13/2023 Urinalysis appears clean though patient has taken antibiotic for only 1 day Follow urine culture Continue ceftriaxone as above

## 2023-02-20 NOTE — Assessment & Plan Note (Signed)
Continue metoprolol and losartan pending med rec

## 2023-02-20 NOTE — Assessment & Plan Note (Signed)
Acute respiratory failure with hypoxia Chest x-ray showing possible pneumonia and patient with desaturations to 88% in the ED Respiratory viral panel negative Continue ceftriaxone and azithromycin Supplemental O2 to keep sats over 94%

## 2023-02-21 DIAGNOSIS — J189 Pneumonia, unspecified organism: Secondary | ICD-10-CM | POA: Diagnosis not present

## 2023-02-21 LAB — URINE CULTURE: Culture: NO GROWTH

## 2023-02-21 MED ORDER — CEFDINIR 300 MG PO CAPS: 300.0000 mg | ORAL_CAPSULE | Freq: Two times a day (BID) | ORAL | 0 refills | Status: DC

## 2023-02-21 MED ORDER — CEFDINIR 300 MG PO CAPS: 300.0000 mg | ORAL_CAPSULE | Freq: Two times a day (BID) | ORAL | 0 refills | Status: AC

## 2023-02-21 NOTE — TOC Transition Note (Signed)
Transition of Care [facility]) - CM/SW Discharge Note   Patient Details  Name: Shiah Berhow Emert MRN: [id number] Date of Birth: 07-20-1933  Transition of Care [facility]) CM/SW Contact:  Darleene Cleaver, LCSW Phone Number: 02/21/2023, 12:03 PM   Clinical Narrative:     Patient will be going home under selfcare.  TOC signing off please reconsult with any other TOC needs.  Patient has reports she does not have any other needs.    Final next level of care: Home/Self Care Barriers to Discharge: Barriers Resolved   Patient Goals and CMS Choice      Discharge Placement                      Patient and family notified of of transfer: 02/21/23  Discharge Plan and Services Additional resources added to the After Visit Summary for                                       Social Determinants of Health (SDOH) Interventions SDOH Screenings   Food Insecurity: No Food Insecurity (02/20/2023)  Housing: Low Risk  (02/20/2023)  Transportation Needs: No Transportation Needs (02/20/2023)  Utilities: Not At Risk (02/20/2023)  Tobacco Use: Low Risk  (02/19/2023)     Readmission Risk Interventions     No data to display

## 2023-02-21 NOTE — Discharge Summary (Signed)
Physician Discharge Summary   Patient: Dawn Mcintyre MRN: [id number] DOB: Nov 27, 1932  Admit date:     02/19/2023  Discharge date: 02/21/23  Discharge Physician: Lurene Shadow   PCP: Marguarite Arbour, MD   Recommendations at discharge:   Follow-up with PCP in 1 week  Discharge Diagnoses: Principal Problem:   PNA (pneumonia) Active Problems:   Acute respiratory failure with hypoxia (HCC)   Vomiting and diarrhea   UTI due to Klebsiella species   Chronic kidney disease, stage 3a (HCC)   Diet-controlled type 2 diabetes mellitus (HCC)   HTN (hypertension)   CAD (coronary artery disease)   PAD (peripheral artery disease) (HCC)   History of bilateral carotid endarterectomy  Resolved Problems:   * No resolved hospital problems. *  Hospital Course:  Dawn Mcintyre is a 89 y.o. female with medical history significant for HTN, HLD, carotid stenosis s/p b/l CEA's, PAD, CAD, diet-controlled type 2 diabetes, neuropathy, CKD 3a, anxiety, insomnia. She was recently diagnosed with Klebsiella pneumoniae UTI and prescribed cefadroxil on 02/17/2023. She presented to the hospital with vomiting and diarrhea.  She said symptoms started  after taking cefadroxil.  She also reported that she had eaten chicken sandwich at [employer]  and her symptoms started after that.  She had multiple episodes of vomiting and diarrhea. In the emergency department, patient was noted to be lethargic and hypoxic with oxygen saturation of 88% on room air.  Chest x-ray showed findings concerning for possible pneumonia.   She was admitted to the hospital for pneumonia complicated by acute hypoxic respiratory failure, and vomiting and diarrhea likely from gastroenteritis.  She was treated with empiric IV antibiotics and required 2 L/min oxygen via nasal cannula..  Her condition has improved and she is tolerating room air.  She is asymptomatic and she is deemed stable for discharge to home today.  She will be  discharged on Omnicef for pneumonia and recently diagnosed UTI (prior to admission).       Consultants: None Procedures performed: None  Disposition: Home Diet recommendation:  Discharge Diet Orders (From admission, onward)     Start     Ordered   02/21/23 0000  Diet - low sodium heart healthy        02/21/23 1009           Cardiac diet DISCHARGE MEDICATION: Allergies as of 02/21/2023       Reactions   Codeine Hives   Hydrocodone Nausea Only   Metronidazole Nausea Only   Propoxyphene Nausea Only   Sulfa Antibiotics Hives   Amoxicillin-pot Clavulanate Diarrhea   Latex Rash        Medication List     STOP taking these medications    fluconazole 150 MG tablet Commonly known as: DIFLUCAN   hydrOXYzine 25 MG tablet Commonly known as: ATARAX   mirabegron ER 25 MG Tb24 tablet Commonly known as: MYRBETRIQ   nystatin cream Commonly known as: MYCOSTATIN       TAKE these medications    acetaminophen 500 MG tablet Commonly known as: TYLENOL Take 500 mg by mouth daily as needed for headache.   ALPRAZolam 0.25 MG tablet Commonly known as: XANAX Take 0.25 mg by mouth at bedtime as needed for sleep.   ascorbic acid 1000 MG tablet Commonly known as: VITAMIN C Take 1,000 mg by mouth daily.   aspirin 81 MG tablet Take 81 mg by mouth daily.   atorvastatin 10 MG tablet Commonly known as: Lipitor Take 1 tablet (  10 mg total) by mouth daily.   cefdinir 300 MG capsule Commonly known as: OMNICEF Take 1 capsule (300 mg total) by mouth 2 (two) times daily for 3 days.   clopidogrel 75 MG tablet Commonly known as: PLAVIX TAKE 1 TABLET BY MOUTH EVERY DAY   felodipine 5 MG 24 hr tablet Commonly known as: PLENDIL Take 5 mg by mouth every morning.   fexofenadine 180 MG tablet Commonly known as: ALLEGRA Take 180 mg by mouth daily as needed for allergies.   fluticasone 50 MCG/ACT nasal spray Commonly known as: FLONASE Place into both nostrils.   gabapentin  100 MG capsule Commonly known as: NEURONTIN Take 100 mg by mouth 3 (three) times daily.   hydrALAZINE 25 MG tablet Commonly known as: APRESOLINE Take 25 mg by mouth 3 (three) times daily.   hydrochlorothiazide 12.5 MG tablet Commonly known as: HYDRODIURIL Take 1 tablet by mouth daily.   isosorbide mononitrate 60 MG 24 hr tablet Commonly known as: IMDUR Take 60 mg by mouth daily.   losartan 100 MG tablet Commonly known as: COZAAR Take 100 mg by mouth daily.   metoprolol tartrate 100 MG tablet Commonly known as: LOPRESSOR Take 100 mg by mouth 2 (two) times daily.   metroNIDAZOLE 0.75 % vaginal gel Commonly known as: METROGEL Place vaginally at bedtime.   ondansetron 4 MG tablet Commonly known as: Zofran Take 1 tablet (4 mg total) by mouth daily as needed for nausea or vomiting. What changed:  when to take this reasons to take this   pantoprazole 40 MG tablet Commonly known as: Protonix Take 1 tablet (40 mg total) by mouth 2 (two) times daily before a meal.   predniSONE 2.5 MG tablet Commonly known as: DELTASONE Take 2.5 mg by mouth daily.   Premarin vaginal cream Generic drug: conjugated estrogens Apply one pea-sized amount around the opening of the urethra three times weekly.   Vitamin D3 1.25 MG (50000 UT) Caps Take 1 capsule by mouth daily.   vitamin E 180 MG (400 UNITS) capsule Take 400 Units by mouth daily.        Follow-up Information     Schedule an appointment as soon as possible for a visit  with Marguarite Arbour, MD.   Specialty: Internal Medicine Contact information: [address] [place] [address] [phone]                Discharge Exam: Ceasar Mons Weights   02/19/23 2027 02/20/23 0359  Weight: 63 kg 64.7 kg   GEN: NAD SKIN: No rash EYES: EOMI ENT: MMM CV: RRR PULM: CTA B ABD: soft, ND, NT, +BS CNS: AAO x 3, non focal EXT: No edema or tenderness   Condition at discharge: good  The  results of significant diagnostics from this hospitalization (including imaging, microbiology, ancillary and laboratory) are listed below for reference.   Imaging Studies: CT Head Wo Contrast  Result Date: 02/20/2023 CLINICAL DATA:  Altered mental status EXAM: CT HEAD WITHOUT CONTRAST TECHNIQUE: Contiguous axial images were obtained from the base of the skull through the vertex without intravenous contrast. RADIATION DOSE REDUCTION: This exam was performed according to the departmental dose-optimization program which includes automated exposure control, adjustment of the mA and/or kV according to patient size and/or use of iterative reconstruction technique. COMPARISON:  12/06/2020 FINDINGS: Brain: There is no mass, hemorrhage or extra-axial collection. The size and configuration of the ventricles and extra-axial CSF spaces are normal. There is hypoattenuation of the white  matter, most commonly indicating chronic small vessel disease. Vascular: Atherosclerotic calcification of the vertebral and internal carotid arteries at the skull base. No abnormal hyperdensity of the major intracranial arteries or dural venous sinuses. Skull: The visualized skull base, calvarium and extracranial soft tissues are normal. Sinuses/Orbits: No fluid levels or advanced mucosal thickening of the visualized paranasal sinuses. No mastoid or middle ear effusion. The orbits are normal. IMPRESSION: 1. No acute intracranial abnormality. 2. Chronic small vessel disease. Electronically Signed   By: Deatra Robinson M.D.   On: 02/20/2023 01:52   DG Chest 2 View  Result Date: 02/20/2023 CLINICAL DATA:  Shortness breath EXAM: CHEST - 2 VIEW COMPARISON:  Chest radiograph 06/05/3 FINDINGS: Stable cardiomediastinal silhouette. Aortic atherosclerotic calcification. Left-greater-than-right basilar airspace opacities. Small left no pneumothorax. No displaced rib fractures IMPRESSION: Left-greater-than-right atelectasis or pneumonia. Small left  pleural effusion. Electronically Signed   By: Minerva Fester M.D.   On: 02/20/2023 01:28    Microbiology: Results for orders placed or performed during the hospital encounter of 02/19/23  Urine Culture     Status: None   Collection Time: 02/19/23  8:29 PM   Specimen: Urine, Clean Catch  Result Value Ref Range Status   Specimen Description   Final    URINE, CLEAN CATCH Performed at [facility], [address] [place], [place] [address]    Special Requests   Final    NONE Performed at [facility], [address] [place], [place] [address]    Culture   Final    NO GROWTH Performed at [facility], [address] [place], [place] [address]    Report Status 02/21/2023 FINAL  Final  Resp panel by RT-PCR (RSV, Flu A&B, Covid) Anterior Nasal Swab     Status: None   Collection Time: 02/20/23 12:51 AM   Specimen: Anterior Nasal Swab  Result Value Ref Range Status   SARS Coronavirus 2 by RT PCR NEGATIVE NEGATIVE Final    Comment: (NOTE) SARS-CoV-2 target nucleic acids are NOT DETECTED.  The SARS-CoV-2 RNA is generally detectable in upper respiratory specimens during the acute phase of infection. The lowest concentration of SARS-CoV-2 viral copies this assay can detect is 138 copies/mL. A negative result does not preclude SARS-Cov-2 infection and should not be used as the sole basis for treatment or other patient management decisions. A negative result may occur with  improper specimen collection/handling, submission of specimen other than nasopharyngeal swab, presence of viral mutation(s) within the areas targeted by this assay, and inadequate number of viral copies(<138 copies/mL). A negative result must be combined with clinical observations, patient history, and epidemiological information. The expected result is Negative.  Fact Sheet for Patients:  BloggerCourse.com  Fact Sheet for Healthcare Providers:   SeriousBroker.it  This test is no t yet approved or cleared by the [place] FDA and  has been authorized for detection and/or diagnosis of SARS-CoV-2 by FDA under an Emergency Use Authorization (EUA). This EUA will remain  in effect (meaning this test can be used) for the duration of the COVID-19 declaration under Section 564(b)(1) of the Act, 21 U.S.C.section 360bbb-3(b)(1), unless the authorization is terminated  or revoked sooner.       Influenza A by PCR NEGATIVE NEGATIVE Final   Influenza B by PCR NEGATIVE NEGATIVE Final    Comment: (NOTE) The Xpert Xpress SARS-CoV-2/FLU/RSV plus assay is intended as an aid in the diagnosis of influenza from Nasopharyngeal swab specimens and should not be used as  a sole basis for treatment. Nasal washings and aspirates are unacceptable for Xpert Xpress SARS-CoV-2/FLU/RSV testing.  Fact Sheet for Patients: BloggerCourse.com  Fact Sheet for Healthcare Providers: SeriousBroker.it  This test is not yet approved or cleared by the [place] FDA and has been authorized for detection and/or diagnosis of SARS-CoV-2 by FDA under an Emergency Use Authorization (EUA). This EUA will remain in effect (meaning this test can be used) for the duration of the COVID-19 declaration under Section 564(b)(1) of the Act, 21 U.S.C. section 360bbb-3(b)(1), unless the authorization is terminated or revoked.     Resp Syncytial Virus by PCR NEGATIVE NEGATIVE Final    Comment: (NOTE) Fact Sheet for Patients: BloggerCourse.com  Fact Sheet for Healthcare Providers: SeriousBroker.it  This test is not yet approved or cleared by the [place] FDA and has been authorized for detection and/or diagnosis of SARS-CoV-2 by FDA under an Emergency Use Authorization (EUA). This EUA will remain in effect (meaning this test can be used) for  the duration of the COVID-19 declaration under Section 564(b)(1) of the Act, 21 U.S.C. section 360bbb-3(b)(1), unless the authorization is terminated or revoked.  Performed at [facility], [address] [place], [place] [address]     Labs: CBC: Recent Labs  Lab 02/19/23 2029 02/20/23 0545  WBC 11.2* 9.2  NEUTROABS 10.3*  --   HGB 14.0 12.4  HCT 43.6 38.4  MCV 95.6 95.0  PLT 251 197   Basic Metabolic Panel: Recent Labs  Lab 02/19/23 2029 02/20/23 0545  NA 138  --   K 3.7  --   CL 105  --   CO2 20*  --   GLUCOSE 134*  --   BUN 24*  --   CREATININE 1.11* 0.84  CALCIUM 9.7  --    Liver Function Tests: Recent Labs  Lab 02/19/23 2029  AST 24  ALT 16  ALKPHOS 73  BILITOT 0.6  PROT 7.2  ALBUMIN 4.3   CBG: No results for input(s): "GLUCAP" in the last 168 hours.  Discharge time spent: greater than 30 minutes.  Signed: Lurene Shadow, MD [facility] 02/21/2023

## 2023-02-21 NOTE — Plan of Care (Signed)

## 2023-04-14 ENCOUNTER — Ambulatory Visit (INDEPENDENT_AMBULATORY_CARE_PROVIDER_SITE_OTHER): Payer: Medicare Other | Admitting: Vascular Surgery

## 2023-04-14 ENCOUNTER — Encounter (INDEPENDENT_AMBULATORY_CARE_PROVIDER_SITE_OTHER): Payer: Self-pay | Admitting: Vascular Surgery

## 2023-04-14 ENCOUNTER — Ambulatory Visit (INDEPENDENT_AMBULATORY_CARE_PROVIDER_SITE_OTHER): Payer: Medicare Other

## 2023-04-14 VITALS — BP 128/57 | HR 57 | Resp 18 | Ht 67.0 in | Wt 140.4 lb

## 2023-04-14 DIAGNOSIS — I6523 Occlusion and stenosis of bilateral carotid arteries: Secondary | ICD-10-CM

## 2023-04-14 DIAGNOSIS — I701 Atherosclerosis of renal artery: Secondary | ICD-10-CM

## 2023-04-14 DIAGNOSIS — E1159 Type 2 diabetes mellitus with other circulatory complications: Secondary | ICD-10-CM

## 2023-04-14 DIAGNOSIS — I739 Peripheral vascular disease, unspecified: Secondary | ICD-10-CM | POA: Diagnosis not present

## 2023-04-14 DIAGNOSIS — I10 Essential (primary) hypertension: Secondary | ICD-10-CM

## 2023-04-14 DIAGNOSIS — I25118 Atherosclerotic heart disease of native coronary artery with other forms of angina pectoris: Secondary | ICD-10-CM

## 2023-04-14 NOTE — Progress Notes (Signed)
MRN : [id number]  Dawn Mcintyre is a 89 y.o. ([birth year]) female who presents with chief complaint of check carotid arteries.  History of Present Illness:   The patient returns today for follow up regarding right renal artery stent placement on 03/05/2022.    Today she is a little concerned that her blood pressure is still so variable.  She reports that frequently in the morning it seems to be somewhat elevated anywhere from the 150s up to 170 but most of the time it is in the 120s to 130s systolic.  She did report 1 episode of systolic pressures of 200 for which she took an extra clonidine.  No other complaints today blood pressure seems to be typical but she does note that it does vary somewhat day-to-day.    It was also noted that during a previous angiogram there was a distal 70% stenosis of the left renal artery.  Today the patient open okay to okay got to get. The left has a 1 to 59% stenosis.    The patient's bilateral internal carotid arteries also have no evidence of significant stenosis post bilateral carotid endarterectomies. The RICA 1-39% and the LICA 1-39%   Duplex ultrasound of the renal arteries demonstrates the right and left renal arteries are widely patent.  The right renal artery stent is free of any flow abnormalities.  There is no evidence for restenosis.  There is a normal resistive index bilaterally with normal size kidneys bilaterally.  No significant change when compared to last year's study.    No outpatient medications have been marked as taking for the 04/14/23 encounter (Appointment) with Gilda Crease, Latina Craver, MD.    Past Medical History:  Diagnosis Date   Arteritis [facility])    Arthritis    Atrophic vaginitis 11/16/2014   Carotid artery stenosis    Cataract    Difficult intubation    Environmental and seasonal allergies    GERD (gastroesophageal reflux disease)    Hyperlipidemia    Hypertension    Lack of bladder control    Neuropathy     Osteoporosis    Pneumonia     x 86yrs ago   Pre-diabetes    Shingles    Ulcer     Past Surgical History:  Procedure Laterality Date   ABDOMINAL HYSTERECTOMY  1972   BREAST CYST EXCISION Right 1972   BREAST CYST EXCISION Left 1972   ENDARTERECTOMY Left 12/24/2017   Procedure: ENDARTERECTOMY CAROTID;  Surgeon: Renford Dills, MD;  Location: [facility] ORS;  Service: Vascular;  Laterality: Left;   ENDARTERECTOMY Right 03/13/2018   Procedure: ENDARTERECTOMY CAROTID;  Surgeon: Renford Dills, MD;  Location: [facility] ORS;  Service: Vascular;  Laterality: Right;   ESOPHAGOGASTRODUODENOSCOPY (EGD) WITH PROPOFOL N/A 11/28/2020   Procedure: ESOPHAGOGASTRODUODENOSCOPY (EGD) WITH PROPOFOL;  Surgeon: Midge Minium, MD;  Location: [facility] ENDOSCOPY;  Service: Endoscopy;  Laterality: N/A;   EYE SURGERY     RENAL ANGIOGRAPHY N/A 03/05/2022   Procedure: RENAL ANGIOGRAPHY;  Surgeon: Renford Dills, MD;  Location: [facility] INVASIVE CV LAB;  Service: Cardiovascular;  Laterality: N/A;   THROAT SURGERY     x 10 yrs ago. throat mass.   TONSILLECTOMY      Social History Social History   Tobacco Use   Smoking status: Never    Passive exposure: Never   Smokeless tobacco: Never  Vaping Use   Vaping status: Never Used  Substance Use Topics   Alcohol use: No    Alcohol/week: 0.0 standard drinks of alcohol   Drug use: No    Family History Family History  Problem Relation Age of Onset   Anuerysm Mother    Prostate cancer Father    Esophageal cancer Sister    Lung cancer Sister    Diabetes Brother    Hearing loss Brother    Bladder Cancer Neg Hx    Kidney cancer Neg Hx    Breast cancer Neg Hx     Allergies  Allergen Reactions   Codeine Hives   Hydrocodone Nausea Only   Metronidazole Nausea Only   Propoxyphene Nausea Only   Sulfa Antibiotics Hives   Amoxicillin-Pot Clavulanate Diarrhea   Latex Rash     REVIEW OF SYSTEMS (Negative unless checked)  Constitutional: [] Weight loss  [] Fever   [] Chills Cardiac: [] Chest pain   [] Chest pressure   [] Palpitations   [] Shortness of breath when laying flat   [] Shortness of breath with exertion. Vascular:  [x] Pain in legs with walking   [] Pain in legs at rest  [] History of DVT   [] Phlebitis   [] Swelling in legs   [] Varicose veins   [] Non-healing ulcers Pulmonary:   [] Uses home oxygen   [] Productive cough   [] Hemoptysis   [] Wheeze  [] COPD   [] Asthma Neurologic:  [] Dizziness   [] Seizures   [] History of stroke   [] History of TIA  [] Aphasia   [] Vissual changes   [] Weakness or numbness in arm   [] Weakness or numbness in leg Musculoskeletal:   [] Joint swelling   [] Joint pain   [] Low back pain Hematologic:  [] Easy bruising  [] Easy bleeding   [] Hypercoagulable state   [] Anemic Gastrointestinal:  [] Diarrhea   [] Vomiting  [] Gastroesophageal reflux/heartburn   [] Difficulty swallowing. Genitourinary:  [] Chronic kidney disease   [] Difficult urination  [] Frequent urination   [] Blood in urine Skin:  [] Rashes   [] Ulcers  Psychological:  [] History of anxiety   []  History of major depression.  Physical Examination  There were no vitals filed for this visit. There is no height or weight on file to calculate BMI. Gen: WD/WN, NAD Head: [place]/AT, No temporalis wasting.  Ear/Nose/Throat: Hearing grossly intact, nares w/o erythema or drainage Eyes: PER, EOMI, sclera nonicteric.  Neck: Supple, no masses.  No bruit or JVD.  Pulmonary:  Good air movement, no audible wheezing, no use of accessory muscles.  Cardiac: RRR, normal S1, S2, no Murmurs. Vascular:  carotid bruit noted Vessel Right Left  Radial Palpable Palpable  Carotid  Palpable  Palpable  Gastrointestinal: soft, non-distended. No guarding/no peritoneal signs.  Musculoskeletal: M/S 5/5 throughout.  No visible deformity.  Neurologic: CN 2-12 intact. Pain and light touch intact in extremities.  Symmetrical.  Speech is fluent. Motor exam as listed above. Psychiatric: Judgment intact, Mood & affect  appropriate for pt's clinical situation. Dermatologic: No rashes or ulcers noted.  No changes consistent with cellulitis.   CBC Lab Results  Component Value Date   WBC 9.2 02/20/2023   HGB 12.4 02/20/2023   HCT 38.4 02/20/2023   MCV 95.0 02/20/2023   PLT 197 02/20/2023    BMET    Component Value Date/Time   NA 138 02/19/2023 2029   NA 139 09/12/2011 0250   K 3.7 02/19/2023 2029   K 3.5 09/12/2011 0250   CL 105 02/19/2023 2029   CL 102 09/12/2011 0250   CO2 20 (L) 02/19/2023 2029  CO2 23 09/12/2011 0250   GLUCOSE 134 (H) 02/19/2023 2029   GLUCOSE 162 (H) 09/12/2011 0250   BUN 24 (H) 02/19/2023 2029   BUN 20 05/30/2016 1358   BUN 23 (H) 09/12/2011 0250   CREATININE 0.84 02/20/2023 0545   CREATININE 1.31 (H) 11/20/2022 1332   CREATININE 0.94 09/12/2011 0250   CALCIUM 9.7 02/19/2023 2029   CALCIUM 9.1 09/12/2011 0250   GFRNONAA >60 02/20/2023 0545   GFRNONAA 39 (L) 11/20/2022 1332   GFRNONAA >60 09/12/2011 0250   GFRAA 55 (L) 10/04/2019 1828   GFRAA >60 09/12/2011 0250   CrCl cannot be calculated (Patient's most recent lab result is older than the maximum 21 days allowed.).  COAG Lab Results  Component Value Date   INR 1.1 10/28/2021   INR 0.99 03/05/2018   INR 1.02 12/16/2017    Radiology No results found.   Assessment/Plan 1. Bilateral carotid artery stenosis Recommend:   Given the patient's asymptomatic subcritical stenosis no further invasive testing or surgery at this time.   Duplex ultrasound shows widely patent carotids post bilateral carotid endarterectomies. There is 1-39% stenosis noted bilaterally.  Unchanged from the previous exam.   Continue antiplatelet therapy as prescribed Continue management of CAD, HTN and Hyperlipidemia Healthy heart diet,  encouraged exercise at least 4 times per week Follow up in 6 months with duplex ultrasound and physical exam  - VAS US CAROTID; Future  2. Renal artery stenosis (HCC) Recommend:   BP today was  acceptable Given patient's atherosclerosis and PAD optimal control of the patient's hypertension is important.   The patient's BP and noninvasive studies support the previous right renal artery intervention is patent.   No further intervention is indicated at this time.   Therefore the patient  will continue the current medications, no changes at this time.   The primary medical service will continue aggressive antihypertensive therapy as per the AHA guidelines.   We will obtain a follow-up renal artery duplex in 12 months - VAS US RENAL ARTERY DUPLEX; Future  3. PAD (peripheral artery disease) (HCC) Recommend:   I do not find evidence of life style limiting vascular disease. The patient specifically denies life style limitation.   Previous noninvasive studies including ABI's of the legs do not identify critical vascular problems.   The patient should continue walking and begin a more formal exercise program. The patient should continue his antiplatelet therapy and aggressive treatment of the lipid abnormalities.   The patient is instructed to call the office if there is a significant change in the lower extremity symptoms, particularly if a wound develops or there is an abrupt increase in leg pain.   Patient will follow-up with me as ordered above  4. Essential hypertension Continue antihypertensive medications as already ordered, these medications have been reviewed and there are no changes at this time.  5. Coronary artery disease of native artery of native heart with stable angina pectoris (HCC) Continue cardiac and antihypertensive medications as already ordered and reviewed, no changes at this time.  Continue statin as ordered and reviewed, no changes at this time  Nitrates PRN for chest pain  6. Type 2 diabetes mellitus with other circulatory complication, without long-term current use of insulin (HCC) Continue hypoglycemic medications as already ordered, these  medications have been reviewed and there are no changes at this time.  Hgb A1C to be monitored as already arranged by primary service    Levora Dredge, MD  04/14/2023 8:42 AM

## 2023-05-19 ENCOUNTER — Ambulatory Visit: Payer: Medicare Other | Admitting: Urology

## 2023-05-19 VITALS — BP 135/72 | Wt 142.0 lb

## 2023-05-19 DIAGNOSIS — N3020 Other chronic cystitis without hematuria: Secondary | ICD-10-CM | POA: Diagnosis not present

## 2023-05-19 LAB — URINALYSIS, COMPLETE
Bilirubin, UA: NEGATIVE
Glucose, UA: NEGATIVE
Ketones, UA: NEGATIVE
Nitrite, UA: NEGATIVE
RBC, UA: NEGATIVE
Specific Gravity, UA: >1.03 — ABNORMAL HIGH (ref 1.005–1.030)
Urobilinogen, Ur: 0.2 mg/dL (ref 0.2–1.0)
pH, UA: 5.5 (ref 5.0–7.5)

## 2023-05-19 LAB — MICROSCOPIC EXAMINATION: Epithelial Cells (non renal): >10 /[HPF] — AB (ref 0–10)

## 2023-05-19 MED ORDER — NITROFURANTOIN MONOHYD MACRO 100 MG PO CAPS: 100.0000 mg | ORAL_CAPSULE | Freq: Every day | ORAL | 3 refills | Status: DC

## 2023-05-19 MED ORDER — MIRABEGRON ER 50 MG PO TB24: 50.0000 mg | ORAL_TABLET | Freq: Every day | ORAL | 3 refills | Status: DC

## 2023-05-19 NOTE — Progress Notes (Signed)
05/19/2023 9:51 AM   Dawn Mcintyre 1933/06/03 [id number]  Referring provider: Marguarite Arbour, MD [address] [facility] [place],  [place] [address]  No chief complaint on file.   HPI: Multiple providers-recurrent UTIs 2020 and had a negative CT scan   Today Patient has a bladder infection every 2 months with abdominal pain frequency and foul-smelling urine that respond favorably to antibiotics.  She thinks she might be infected today because she has a little bit of terminal burning   At baseline she voids every 2 hours gets up least 4 times a day.  No ankle edema.  She is continent   She describes an arterial procedure on her renal artery that helped her hypertension with a stent in the last many months.  Kidneys were otherwise healthy January 17, 2022.  She had positive cultures in the medical record     Has recurrent bladder infections.  Pathophysiology described.  Based upon last positive culture and allergies I called in Macrodantin 100 mg 3 twice a day for 7 days.  Then I did put her on daily Macrodantin 100 mg 30 x 11.d  Return in 8 weeks for cystoscopy.  She gets hives with sulfa drugs.  She gets diarrhea with Augmentin    Today Frequent stable.  Last culture positive Clinically infection free and she chose not to have cystoscopy which I thought was fine.  She still gets up 3 times at night.  She states she had some Myrbetriq at home and she may or may not go back on it.  Today Infection free on Macrodantin. Did not go back on Myrbetriq but she really thinks this helps the nighttime frequency getting up 3-4 times a night.  Clinically not infected       PMH: Past Medical History:  Diagnosis Date   Arteritis (HCC)    Arthritis    Atrophic vaginitis 11/16/2014   Carotid artery stenosis    Cataract    Difficult intubation    Environmental and seasonal allergies    GERD (gastroesophageal reflux disease)    Hyperlipidemia    Hypertension    Lack of  bladder control    Neuropathy    Osteoporosis    Pneumonia     x 14yrs ago   Pre-diabetes    Shingles    Ulcer     Surgical History: Past Surgical History:  Procedure Laterality Date   ABDOMINAL HYSTERECTOMY  1972   BREAST CYST EXCISION Right 1972   BREAST CYST EXCISION Left 1972   ENDARTERECTOMY Left 12/24/2017   Procedure: ENDARTERECTOMY CAROTID;  Surgeon: Renford Dills, MD;  Location: [facility];  Service: Vascular;  Laterality: Left;   ENDARTERECTOMY Right 03/13/2018   Procedure: ENDARTERECTOMY CAROTID;  Surgeon: Renford Dills, MD;  Location: [facility];  Service: Vascular;  Laterality: Right;   ESOPHAGOGASTRODUODENOSCOPY (EGD) WITH PROPOFOL N/A 11/28/2020   Procedure: ESOPHAGOGASTRODUODENOSCOPY (EGD) WITH PROPOFOL;  Surgeon: Midge Minium, MD;  Location: [facility];  Service: Endoscopy;  Laterality: N/A;   EYE SURGERY     RENAL ANGIOGRAPHY N/A 03/05/2022   Procedure: RENAL ANGIOGRAPHY;  Surgeon: Renford Dills, MD;  Location: [facility];  Service: Cardiovascular;  Laterality: N/A;   THROAT SURGERY     x 10 yrs ago. throat mass.   TONSILLECTOMY      Home Medications:  Allergies as of 05/19/2023       Reactions   Codeine Hives   Hydrocodone Nausea Only  Metronidazole Nausea Only   Propoxyphene Nausea Only   Sulfa Antibiotics Hives   Amoxicillin-pot Clavulanate Diarrhea   Latex Rash        Medication List        Accurate as of May 19, 2023  9:51 AM. If you have any questions, ask your nurse or doctor.          acetaminophen 500 MG tablet Commonly known as: TYLENOL Take 500 mg by mouth daily as needed for headache.   ALPRAZolam 0.25 MG tablet Commonly known as: XANAX Take 0.25 mg by mouth at bedtime as needed for sleep.   ascorbic acid 1000 MG tablet Commonly known as: VITAMIN C Take 1,000 mg by mouth daily.   aspirin 81 MG tablet Take 81 mg by mouth daily.   atorvastatin 10 MG tablet Commonly known as: Lipitor Take 1  tablet (10 mg total) by mouth daily.   cloNIDine 0.1 MG tablet Commonly known as: CATAPRES Take by mouth.   clopidogrel 75 MG tablet Commonly known as: PLAVIX TAKE 1 TABLET BY MOUTH EVERY DAY   cyanocobalamin 1000 MCG/ML injection Commonly known as: VITAMIN B12 Inject into the muscle.   felodipine 5 MG 24 hr tablet Commonly known as: PLENDIL Take 5 mg by mouth every morning.   fexofenadine 180 MG tablet Commonly known as: ALLEGRA Take 180 mg by mouth daily as needed for allergies.   fluticasone 50 MCG/ACT nasal spray Commonly known as: FLONASE Place into both nostrils.   gabapentin 100 MG capsule Commonly known as: NEURONTIN Take 100 mg by mouth 3 (three) times daily.   hydrALAZINE 25 MG tablet Commonly known as: APRESOLINE Take 25 mg by mouth 3 (three) times daily.   hydrochlorothiazide 12.5 MG tablet Commonly known as: HYDRODIURIL Take 1 tablet by mouth daily.   isosorbide mononitrate 60 MG 24 hr tablet Commonly known as: IMDUR Take 60 mg by mouth daily.   lidocaine 5 % Commonly known as: LIDODERM Place onto the skin.   losartan 100 MG tablet Commonly known as: COZAAR Take 100 mg by mouth daily.   metoprolol tartrate 100 MG tablet Commonly known as: LOPRESSOR Take 100 mg by mouth 2 (two) times daily.   metroNIDAZOLE 0.75 % vaginal gel Commonly known as: METROGEL Place vaginally at bedtime.   nitrofurantoin (macrocrystal-monohydrate) 100 MG capsule Commonly known as: MACROBID Take by mouth.   ondansetron 4 MG tablet Commonly known as: Zofran Take 1 tablet (4 mg total) by mouth daily as needed for nausea or vomiting.   pantoprazole 40 MG tablet Commonly known as: Protonix Take 1 tablet (40 mg total) by mouth 2 (two) times daily before a meal.   predniSONE 2.5 MG tablet Commonly known as: DELTASONE Take 2.5 mg by mouth daily.   Premarin vaginal cream Generic drug: conjugated estrogens Apply one pea-sized amount around the opening of the  urethra three times weekly.   Vitamin D3 1.25 MG (50000 UT) Caps Take 1 capsule by mouth daily.   vitamin E 180 MG (400 UNITS) capsule Take 400 Units by mouth daily.        Allergies:  Allergies  Allergen Reactions   Codeine Hives   Hydrocodone Nausea Only   Metronidazole Nausea Only   Propoxyphene Nausea Only   Sulfa Antibiotics Hives   Amoxicillin-Pot Clavulanate Diarrhea   Latex Rash    Family History: Family History  Problem Relation Age of Onset   Anuerysm Mother    Prostate cancer Father    Esophageal cancer Sister  Lung cancer Sister    Diabetes Brother    Hearing loss Brother    Bladder Cancer Neg Hx    Kidney cancer Neg Hx    Breast cancer Neg Hx     Social History:  reports that she has never smoked. She has never been exposed to tobacco smoke. She has never used smokeless tobacco. She reports that she does not drink alcohol and does not use drugs.  ROS:                                        Physical Exam: There were no vitals taken for this visit.  Constitutional:  Alert and oriented, No acute distress. HEENT: [place] AT, moist mucus membranes.  Trachea midline, no masses.  Laboratory Data: Lab Results  Component Value Date   WBC 9.2 02/20/2023   HGB 12.4 02/20/2023   HCT 38.4 02/20/2023   MCV 95.0 02/20/2023   PLT 197 02/20/2023    Lab Results  Component Value Date   CREATININE 0.84 02/20/2023    No results found for: "PSA"  No results found for: "TESTOSTERONE"  Lab Results  Component Value Date   HGBA1C 6.0 (H) 10/28/2021    Urinalysis    Component Value Date/Time   COLORURINE YELLOW (A) 02/19/2023 2029   APPEARANCEUR CLEAR (A) 02/19/2023 2029   APPEARANCEUR Clear 11/18/2022 1118   LABSPEC 1.021 02/19/2023 2029   LABSPEC 1.026 09/12/2011 2304   PHURINE 5.0 02/19/2023 2029   GLUCOSEU NEGATIVE 02/19/2023 2029   GLUCOSEU Negative 09/12/2011 2304   HGBUR NEGATIVE 02/19/2023 2029   BILIRUBINUR NEGATIVE  02/19/2023 2029   BILIRUBINUR Negative 11/18/2022 1118   BILIRUBINUR Negative 09/12/2011 2304   KETONESUR NEGATIVE 02/19/2023 2029   PROTEINUR 100 (A) 02/19/2023 2029   UROBILINOGEN 0.2 04/12/2021 1144   NITRITE NEGATIVE 02/19/2023 2029   LEUKOCYTESUR NEGATIVE 02/19/2023 2029   LEUKOCYTESUR 2+ 09/12/2011 2304    Pertinent Imaging:   Assessment & Plan: Both Myrbetriq and Macrodantin 90 x 3 sent to pharmacy and I will see in a year  1. Chronic cystitis  - Urinalysis, Complete   No follow-ups on file.  Martina Sinner, MD  [facility] [address], [address] [place], [place] [address] [phone]

## 2023-05-19 NOTE — Addendum Note (Signed)
Addended by: Sueanne Margarita on: 05/19/2023 10:06 AM   Modules accepted: Orders

## 2023-05-20 ENCOUNTER — Inpatient Hospital Stay: Payer: Medicare Other | Attending: Internal Medicine

## 2023-05-20 DIAGNOSIS — N183 Chronic kidney disease, stage 3 unspecified: Secondary | ICD-10-CM | POA: Insufficient documentation

## 2023-05-20 DIAGNOSIS — D472 Monoclonal gammopathy: Secondary | ICD-10-CM | POA: Diagnosis present

## 2023-05-20 DIAGNOSIS — D649 Anemia, unspecified: Secondary | ICD-10-CM | POA: Diagnosis not present

## 2023-05-20 LAB — CBC WITH DIFFERENTIAL (CANCER CENTER ONLY)
Abs Immature Granulocytes: 0.05 10*3/uL (ref 0.00–0.07)
Basophils Absolute: 0.1 10*3/uL (ref 0.0–0.1)
Basophils Relative: 1 %
Eosinophils Absolute: 0.1 10*3/uL (ref 0.0–0.5)
Eosinophils Relative: 1 %
HCT: 36.1 % (ref 36.0–46.0)
Hemoglobin: 11.6 g/dL — ABNORMAL LOW (ref 12.0–15.0)
Immature Granulocytes: 1 %
Lymphocytes Relative: 19 %
Lymphs Abs: 1.5 10*3/uL (ref 0.7–4.0)
MCH: 30.4 pg (ref 26.0–34.0)
MCHC: 32.1 g/dL (ref 30.0–36.0)
MCV: 94.8 fL (ref 80.0–100.0)
Monocytes Absolute: 0.6 10*3/uL (ref 0.1–1.0)
Monocytes Relative: 8 %
Neutro Abs: 5.7 10*3/uL (ref 1.7–7.7)
Neutrophils Relative %: 70 %
Platelet Count: 218 10*3/uL (ref 150–400)
RBC: 3.81 MIL/uL — ABNORMAL LOW (ref 3.87–5.11)
RDW: 12.4 % (ref 11.5–15.5)
WBC Count: 8 10*3/uL (ref 4.0–10.5)
nRBC: 0 % (ref 0.0–0.2)

## 2023-05-20 LAB — FERRITIN: Ferritin: 18 ng/mL (ref 11–307)

## 2023-05-20 LAB — IRON AND TIBC
Iron: 82 ug/dL (ref 28–170)
Saturation Ratios: 21 % (ref 10.4–31.8)
TIBC: 386 ug/dL (ref 250–450)
UIBC: 304 ug/dL

## 2023-05-20 LAB — CMP (CANCER CENTER ONLY)
ALT: 13 U/L (ref 0–44)
AST: 22 U/L (ref 15–41)
Albumin: 4 g/dL (ref 3.5–5.0)
Alkaline Phosphatase: 71 U/L (ref 38–126)
Anion gap: 8 (ref 5–15)
BUN: 18 mg/dL (ref 8–23)
CO2: 23 mmol/L (ref 22–32)
Calcium: 9.8 mg/dL (ref 8.9–10.3)
Chloride: 105 mmol/L (ref 98–111)
Creatinine: 1.05 mg/dL — ABNORMAL HIGH (ref 0.44–1.00)
GFR, Estimated: 51 mL/min — ABNORMAL LOW (ref >60)
Glucose, Bld: 152 mg/dL — ABNORMAL HIGH (ref 70–99)
Potassium: 4 mmol/L (ref 3.5–5.1)
Sodium: 136 mmol/L (ref 135–145)
Total Bilirubin: 0.5 mg/dL (ref 0.3–1.2)
Total Protein: 6.6 g/dL (ref 6.5–8.1)

## 2023-05-21 LAB — CULTURE, URINE COMPREHENSIVE

## 2023-05-21 LAB — KAPPA/LAMBDA LIGHT CHAINS
Kappa free light chain: 17.5 mg/L (ref 3.3–19.4)
Kappa, lambda light chain ratio: 1.27 (ref 0.26–1.65)
Lambda free light chains: 13.8 mg/L (ref 5.7–26.3)

## 2023-05-22 LAB — MULTIPLE MYELOMA PANEL, SERUM
Albumin SerPl Elph-Mcnc: 3.7 g/dL (ref 2.9–4.4)
Albumin/Glob SerPl: 1.5 (ref 0.7–1.7)
Alpha 1: 0.3 g/dL (ref 0.0–0.4)
Alpha2 Glob SerPl Elph-Mcnc: 0.7 g/dL (ref 0.4–1.0)
B-Globulin SerPl Elph-Mcnc: 1 g/dL (ref 0.7–1.3)
Gamma Glob SerPl Elph-Mcnc: 0.6 g/dL (ref 0.4–1.8)
Globulin, Total: 2.5 g/dL (ref 2.2–3.9)
IgA: 158 mg/dL (ref 64–422)
IgG (Immunoglobin G), Serum: 659 mg/dL (ref 586–1602)
IgM (Immunoglobulin M), Srm: 31 mg/dL (ref 26–217)
M Protein SerPl Elph-Mcnc: 0.1 g/dL — ABNORMAL HIGH
Total Protein ELP: 6.2 g/dL (ref 6.0–8.5)

## 2023-05-23 ENCOUNTER — Telehealth: Payer: Self-pay | Admitting: *Deleted

## 2023-05-23 MED ORDER — CIPROFLOXACIN HCL 250 MG PO TABS: 250.0000 mg | ORAL_TABLET | Freq: Two times a day (BID) | ORAL | 0 refills | Status: AC

## 2023-05-23 NOTE — Telephone Encounter (Signed)
-----   Message from Pound A Macdiarmid sent at 05/23/2023 10:36 AM EDT ----- Ciprofloxacin 250 mg twice a day for 7 days Then go back on daily antibiotic ----- Message ----- From: Interface, [facility] Lab Results In Sent: 05/19/2023  11:36 AM EDT To: Alfredo Martinez, MD

## 2023-05-23 NOTE — Telephone Encounter (Signed)
Left detailed message on VM, rx sent to pharmacy by e-script Advised pt to call if any questions

## 2023-05-27 ENCOUNTER — Encounter: Payer: Self-pay | Admitting: Nurse Practitioner

## 2023-05-27 ENCOUNTER — Ambulatory Visit: Payer: Medicare Other | Admitting: Internal Medicine

## 2023-05-27 ENCOUNTER — Inpatient Hospital Stay: Payer: Medicare Other | Admitting: Nurse Practitioner

## 2023-05-27 ENCOUNTER — Other Ambulatory Visit: Payer: Self-pay

## 2023-05-27 VITALS — BP 133/61 | HR 56 | Temp 96.9°F | Resp 18 | Ht 67.0 in | Wt 143.1 lb

## 2023-05-27 DIAGNOSIS — D509 Iron deficiency anemia, unspecified: Secondary | ICD-10-CM

## 2023-05-27 DIAGNOSIS — D472 Monoclonal gammopathy: Secondary | ICD-10-CM

## 2023-05-27 NOTE — Progress Notes (Signed)
[facility] CONSULT NOTE  Patient Care Team: Marguarite Arbour, MD as PCP - General (Internal Medicine) Earna Coder, MD as Consulting Physician (Hematology) Lonell Face, MD as Consulting Physician (Neurology)  CHIEF COMPLAINTS/PURPOSE OF CONSULTATION: Monoclonal gammopathy  HEMATOLOGY HISTORY  # OCT 2022 [Dr.Shah; Neuropathy-] Immunofixation shows IgG monoclonal protein with lambda light chain specificity; K/L=WNL; MAY 2023- 0.1mg /dl; K/l= N.   # CKD stage- IIIA- [Dr.Korrpati]; # Temporal arteritis: Continue the small dose of prednisone daily; OA  HISTORY OF PRESENTING ILLNESS: Alone.  Ambulating independently.  Dawn Mcintyre 89 y.o. female returns to clinic for follow up and continued observation of her MGUS. She has ongoing pain of the right hip but it is unchanged and is followed by orthopedics. No worsening bone pain. Denies numbness or tingling. Denies new complaints. Denies unintentional weight loss.   Review of Systems  Constitutional:  Positive for malaise/fatigue. Negative for chills, diaphoresis, fever and weight loss.  HENT:  Negative for nosebleeds and sore throat.   Eyes:  Negative for double vision.  Respiratory:  Negative for cough, hemoptysis, sputum production, shortness of breath and wheezing.   Cardiovascular:  Negative for chest pain, palpitations, orthopnea and leg swelling.  Gastrointestinal:  Negative for abdominal pain, blood in stool, constipation, diarrhea, heartburn, melena, nausea and vomiting.  Genitourinary:  Negative for dysuria, frequency and urgency.  Musculoskeletal:  Negative for back pain and joint pain.  Skin: Negative.  Negative for itching and rash.  Neurological:  Positive for tingling. Negative for dizziness, focal weakness, weakness and headaches.  Endo/Heme/Allergies:  Does not bruise/bleed easily.  Psychiatric/Behavioral:  Negative for depression. The patient is not nervous/anxious and does not have insomnia.      MEDICAL HISTORY:  Past Medical History:  Diagnosis Date   Arteritis (HCC)    Arthritis    Atrophic vaginitis 11/16/2014   Carotid artery stenosis    Cataract    Difficult intubation    Environmental and seasonal allergies    GERD (gastroesophageal reflux disease)    Hyperlipidemia    Hypertension    Lack of bladder control    Neuropathy    Osteoporosis    Pneumonia     x 54yrs ago   Pre-diabetes    Shingles    Ulcer     SURGICAL HISTORY: Past Surgical History:  Procedure Laterality Date   ABDOMINAL HYSTERECTOMY  1972   BREAST CYST EXCISION Right 1972   BREAST CYST EXCISION Left 1972   ENDARTERECTOMY Left 12/24/2017   Procedure: ENDARTERECTOMY CAROTID;  Surgeon: Renford Dills, MD;  Location: [facility];  Service: Vascular;  Laterality: Left;   ENDARTERECTOMY Right 03/13/2018   Procedure: ENDARTERECTOMY CAROTID;  Surgeon: Renford Dills, MD;  Location: [facility];  Service: Vascular;  Laterality: Right;   ESOPHAGOGASTRODUODENOSCOPY (EGD) WITH PROPOFOL N/A 11/28/2020   Procedure: ESOPHAGOGASTRODUODENOSCOPY (EGD) WITH PROPOFOL;  Surgeon: Midge Minium, MD;  Location: [facility];  Service: Endoscopy;  Laterality: N/A;   EYE SURGERY     RENAL ANGIOGRAPHY N/A 03/05/2022   Procedure: RENAL ANGIOGRAPHY;  Surgeon: Renford Dills, MD;  Location: [facility];  Service: Cardiovascular;  Laterality: N/A;   THROAT SURGERY     x 10 yrs ago. throat mass.   TONSILLECTOMY      SOCIAL HISTORY: Social History   Socioeconomic History   Marital status: Widowed    Spouse name: Not on file   Number of children: Not on file   Years of education: Not on  file   Highest education level: Not on file  Occupational History   Not on file  Tobacco Use   Smoking status: Never    Passive exposure: Never   Smokeless tobacco: Never  Vaping Use   Vaping status: Never Used  Substance and Sexual Activity   Alcohol use: No    Alcohol/week: 0.0 standard drinks of alcohol    Drug use: No   Sexual activity: Not Currently  Other Topics Concern   Not on file  Social History Narrative   Not on file   Social Determinants of Health   Financial Resource Strain: Not on file  Food Insecurity: No Food Insecurity (02/20/2023)   Hunger Vital Sign    Worried About Running Out of Food in the Last Year: Never true    Ran Out of Food in the Last Year: Never true  Transportation Needs: No Transportation Needs (02/20/2023)   PRAPARE - Administrator, Civil Service (Medical): No    Lack of Transportation (Non-Medical): No  Physical Activity: Not on file  Stress: Not on file  Social Connections: Not on file  Intimate Partner Violence: Not At Risk (02/20/2023)   Humiliation, Afraid, Rape, and Kick questionnaire    Fear of Current or Ex-Partner: No    Emotionally Abused: No    Physically Abused: No    Sexually Abused: No    FAMILY HISTORY: Family History  Problem Relation Age of Onset   Anuerysm Mother    Prostate cancer Father    Esophageal cancer Sister    Lung cancer Sister    Diabetes Brother    Hearing loss Brother    Bladder Cancer Neg Hx    Kidney cancer Neg Hx    Breast cancer Neg Hx     ALLERGIES:  is allergic to codeine, hydrocodone, metronidazole, propoxyphene, sulfa antibiotics, amoxicillin-pot clavulanate, and latex.  MEDICATIONS:  Current Outpatient Medications  Medication Sig Dispense Refill   acetaminophen (TYLENOL) 500 MG tablet Take 500 mg by mouth daily as needed for headache.     ALPRAZolam (XANAX) 0.25 MG tablet Take 0.25 mg by mouth at bedtime as needed for sleep.     ascorbic acid (VITAMIN C) 1000 MG tablet Take 1,000 mg by mouth daily.     aspirin 81 MG tablet Take 81 mg by mouth daily.     Cholecalciferol (VITAMIN D3) 1.25 MG (50000 UT) CAPS Take 1 capsule by mouth daily.     ciprofloxacin (CIPRO) 250 MG tablet Take 1 tablet (250 mg total) by mouth 2 (two) times daily for 7 days. 14 tablet 0   cloNIDine (CATAPRES) 0.1 MG  tablet Take by mouth.     clopidogrel (PLAVIX) 75 MG tablet TAKE 1 TABLET BY MOUTH EVERY DAY 30 tablet 4   conjugated estrogens (PREMARIN) vaginal cream Apply one pea-sized amount around the opening of the urethra three times weekly. 30 g 3   cyanocobalamin (VITAMIN B12) 1000 MCG/ML injection Inject into the muscle.     felodipine (PLENDIL) 5 MG 24 hr tablet Take 5 mg by mouth every morning.     fexofenadine (ALLEGRA) 180 MG tablet Take 180 mg by mouth daily as needed for allergies.      fluticasone (FLONASE) 50 MCG/ACT nasal spray Place into both nostrils.     gabapentin (NEURONTIN) 100 MG capsule Take 100 mg by mouth 3 (three) times daily.     hydrALAZINE (APRESOLINE) 25 MG tablet Take 25 mg by mouth 3 (three) times daily.  hydrochlorothiazide (HYDRODIURIL) 12.5 MG tablet Take 1 tablet by mouth daily.     isosorbide mononitrate (IMDUR) 60 MG 24 hr tablet Take 60 mg by mouth daily.     lidocaine (LIDODERM) 5 % Place onto the skin.     losartan (COZAAR) 100 MG tablet Take 100 mg by mouth daily.     metoprolol tartrate (LOPRESSOR) 100 MG tablet Take 100 mg by mouth 2 (two) times daily.     metroNIDAZOLE (METROGEL) 0.75 % vaginal gel Place vaginally at bedtime.     mirabegron ER (MYRBETRIQ) 50 MG TB24 tablet Take 1 tablet (50 mg total) by mouth daily. 90 tablet 3   nitrofurantoin, macrocrystal-monohydrate, (MACROBID) 100 MG capsule Take 1 capsule (100 mg total) by mouth daily. 90 capsule 3   ondansetron (ZOFRAN) 4 MG tablet Take 1 tablet (4 mg total) by mouth daily as needed for nausea or vomiting. 30 tablet 1   predniSONE (DELTASONE) 2.5 MG tablet Take 2.5 mg by mouth daily.     vitamin E 180 MG (400 UNITS) capsule Take 400 Units by mouth daily.     atorvastatin (LIPITOR) 10 MG tablet Take 1 tablet (10 mg total) by mouth daily. 30 tablet 4   pantoprazole (PROTONIX) 40 MG tablet Take 1 tablet (40 mg total) by mouth 2 (two) times daily before a meal. 60 tablet 0   No current  facility-administered medications for this visit.    PHYSICAL EXAMINATION: Vitals:   05/27/23 1052  BP: 133/61  Pulse: (!) 56  Resp: 18  Temp: (!) 96.9 F (36.1 C)   Filed Weights   05/27/23 1052  Weight: 143 lb 1.6 oz (64.9 kg)   Physical Exam Vitals reviewed.  HENT:     Head: Normocephalic and atraumatic.  Pulmonary:     Effort: No respiratory distress.  Abdominal:     General: There is no distension.  Musculoskeletal:     Comments: ambulating  Skin:    General: Skin is warm.     Coloration: Skin is not pale.  Neurological:     Mental Status: She is alert and oriented to person, place, and time.  Psychiatric:        Mood and Affect: Mood normal.        Behavior: Behavior normal.   LABORATORY DATA:  I have reviewed the data as listed Lab Results  Component Value Date   WBC 8.0 05/20/2023   HGB 11.6 (L) 05/20/2023   HCT 36.1 05/20/2023   MCV 94.8 05/20/2023   PLT 218 05/20/2023   Recent Labs    11/20/22 1332 02/19/23 2029 02/20/23 0545 05/20/23 1349  NA 136 138  --  136  K 4.0 3.7  --  4.0  CL 107 105  --  105  CO2 22 20*  --  23  GLUCOSE 139* 134*  --  152*  BUN 24* 24*  --  18  CREATININE 1.31* 1.11* 0.84 1.05*  CALCIUM 10.0 9.7  --  9.8  GFRNONAA 39* 48* >60 51*  PROT 6.7 7.2  --  6.6  ALBUMIN 4.0 4.3  --  4.0  AST 20 24  --  22  ALT 12 16  --  13  ALKPHOS 74 73  --  71  BILITOT 0.7 0.6  --  0.5   Iron/TIBC/Ferritin/ %Sat    Component Value Date/Time   IRON 82 05/20/2023 1349   TIBC 386 05/20/2023 1349   FERRITIN 18 05/20/2023 1349   IRONPCTSAT 21 05/20/2023 1349  Component Ref Range & Units 1 mo ago 7 mo ago 8 mo ago 1 yr ago  IgG (Immunoglobin G), Serum 586 - 1,602 mg/dL 191 478 295 Low  621  IgA 64 - 422 mg/dL 308 657 846 962  IgM (Immunoglobulin M), Srm 26 - 217 mg/dL 31 28 26  CM 26 CM  Total Protein ELP 6.0 - 8.5 g/dL 6.2 VC 6.3 VC 5.9 Low  VC 5.9 Low  VC  Albumin SerPl Elph-Mcnc 2.9 - 4.4 g/dL 3.7 VC 3.7 VC 3.6 VC 3.4 VC   Alpha 1 0.0 - 0.4 g/dL 0.3 VC 0.3 VC 0.2 VC 0.3 VC  Alpha2 Glob SerPl Elph-Mcnc 0.4 - 1.0 g/dL 0.7 VC 0.8 VC 0.7 VC 0.7 VC  B-Globulin SerPl Elph-Mcnc 0.7 - 1.3 g/dL 1.0 VC 1.0 VC 0.9 VC 0.9 VC  Gamma Glob SerPl Elph-Mcnc 0.4 - 1.8 g/dL 0.6 VC 0.5 VC 0.4 VC 0.6 VC  M Protein SerPl Elph-Mcnc Not Observed g/dL 0.1 High  VC Not Observed VC Not Observed VC 0.1 High  VC  Globulin, Total 2.2 - 3.9 g/dL 2.5 VC 2.6 VC 2.3 VC 2.5 VC  Albumin/Glob SerPl 0.7 - 1.7 1.5 VC 1.5 VC 1.6 VC 1.4 VC  IFE 1 Comment Abnormal  VC Comment Abnormal  VC, CM Comment Abnormal  VC, CM Comment Abnormal  VC, CM  Comment: (NOTE) A faint band was noted in IgG; light chain specificity could not be verified. Recommend retesting in 4-6 months.   Lab Results  Component Value Date   KPAFRELGTCHN 17.5 05/20/2023   KPAFRELGTCHN 15.2 11/20/2022   KPAFRELGTCHN 16.3 10/03/2022   LAMBDASER 13.8 05/20/2023   LAMBDASER 11.2 11/20/2022   LAMBDASER 12.7 10/03/2022   KAPLAMBRATIO 1.27 05/20/2023   KAPLAMBRATIO 1.36 11/20/2022   KAPLAMBRATIO 1.28 10/03/2022     Assessment & Plan:   # MGUS (monoclonal gammopathy of unknown significance)- October 2022 [neurology] IgG lambda 0.2 g/dL; kappa lambda light chain ratio normal.  Chronic kidney disease GFR 50-60; mild anemia hemoglobin 11; normal calcium.  No worsening bone pain.    # July 2024- Immunofixation shows IgG monoclonal protein with kappa light chain specificity; K/L light chain= normal.  Clinically MGUS, no significant concerns of any progression of disease. Anemia, not related to multiple myeloma.   # Anemia- baseline around 11. Today, hmg 11.6. Ferritin 18, Iron sat 21. She had discontinued iron supplement. Recommend restarting gentle iron daily.    # Chronic kidney disease-stage-stage IIIb [Dr.K]; followed by nephrology.  Renal function slightly worse 51. Improved. Continue follow up with Dr. Verne Spurr. Wynelle Link.    DISPOSITION: 6 mo- labs (cbc, cmp, mm panel, k/l  light chains, ferritin, iron studies) Week later- see Dr Donneta Romberg- la  No problem-specific Assessment & Plan notes found for this encounter.  All questions were answered. The patient knows to call the clinic with any problems, questions or concerns.   Alinda Dooms, NP 05/27/2023

## 2023-05-27 NOTE — Patient Instructions (Signed)
Please restart your iron supplement. I like 'Gentle Iron' which is over the counter and doesn't have side effects. Take 1 a day. It was a pleasure meeting you today and thank you for allowing me to participate in your care. -Consuello Masse, NP

## 2023-06-30 ENCOUNTER — Ambulatory Visit (INDEPENDENT_AMBULATORY_CARE_PROVIDER_SITE_OTHER): Payer: Medicare Other

## 2023-06-30 ENCOUNTER — Other Ambulatory Visit (HOSPITAL_COMMUNITY)
Admission: RE | Admit: 2023-06-30 | Discharge: 2023-06-30 | Disposition: A | Discharge status: Home / Self Care | Payer: Medicare Other | Source: Ambulatory Visit | Attending: Obstetrics and Gynecology | Admitting: Obstetrics and Gynecology

## 2023-06-30 DIAGNOSIS — N760 Acute vaginitis: Secondary | ICD-10-CM | POA: Insufficient documentation

## 2023-06-30 DIAGNOSIS — R300 Dysuria: Secondary | ICD-10-CM | POA: Diagnosis not present

## 2023-06-30 MED ORDER — NITROFURANTOIN MONOHYD MACRO 100 MG PO CAPS: 100.0000 mg | ORAL_CAPSULE | Freq: Two times a day (BID) | ORAL | 0 refills | Status: AC

## 2023-06-30 MED ORDER — FLUCONAZOLE 150 MG PO TABS: 150.0000 mg | ORAL_TABLET | Freq: Once | ORAL | 1 refills | Status: AC

## 2023-06-30 NOTE — Progress Notes (Signed)
SUBJECTIVE: Dawn Mcintyre is a 89 y.o. female who complains of urinary frequency, urgency and dysuria x 3-4 days, without flank pain, fever, chills, or abnormal vaginal discharge or bleeding.   OBJECTIVE: Appears well, in no apparent distress.  Vital signs are normal. Urine dipstick shows negative for all components.    ASSESSMENT: Dysuria  PLAN: Treatment per orders.  Pt requested treatment medication be sent for UTI and Yeast. Pt Call or return to clinic prn if these symptoms worsen or fail to improve as anticipated.

## 2023-07-01 LAB — URINE CULTURE

## 2023-07-01 LAB — CERVICOVAGINAL ANCILLARY ONLY
Comment: NEGATIVE
Comment: NEGATIVE
Comment: NEGATIVE

## 2023-07-07 ENCOUNTER — Telehealth: Payer: Self-pay

## 2023-07-07 NOTE — Telephone Encounter (Signed)
TC to pt regarding results Pt made aware vaginal swab was insufficient pt notes using OTC vaginal cream  And took diflucan wants to see how she feels in a few days . Pt will call for appt if no relief by Wed.

## 2023-07-08 ENCOUNTER — Other Ambulatory Visit (HOSPITAL_COMMUNITY)
Admission: RE | Admit: 2023-07-08 | Discharge: 2023-07-08 | Disposition: A | Discharge status: Home / Self Care | Payer: Medicare Other | Source: Ambulatory Visit | Attending: Obstetrics & Gynecology | Admitting: Obstetrics & Gynecology

## 2023-07-08 ENCOUNTER — Ambulatory Visit: Payer: Medicare Other | Admitting: Obstetrics & Gynecology

## 2023-07-08 ENCOUNTER — Encounter: Payer: Self-pay | Admitting: Obstetrics & Gynecology

## 2023-07-08 VITALS — BP 147/70 | HR 60 | Wt 138.2 lb

## 2023-07-08 DIAGNOSIS — N760 Acute vaginitis: Secondary | ICD-10-CM

## 2023-07-08 DIAGNOSIS — R1030 Lower abdominal pain, unspecified: Secondary | ICD-10-CM | POA: Diagnosis not present

## 2023-07-08 MED ORDER — CLOBETASOL PROPIONATE 0.05 % EX CREA: 1.0000 | TOPICAL_CREAM | Freq: Two times a day (BID) | CUTANEOUS | 2 refills | Status: DC

## 2023-07-08 NOTE — Progress Notes (Signed)
GYNECOLOGY OFFICE VISIT NOTE  History:   Dawn Mcintyre is a 89 y.o. [phone] with history of hysterectomy and removal of both ovaries decades ago for benign indications,here today for evaluation of vaginal discomfort x 4 days.  She reports lower abdominal/pelvic pain which she says feels is vaginal pain, "it is on the inside" and is there upon awakening.  Also has pain after urination, had negative UTI evaluation on 06/30/2023 (negative urine culture abut insufficient cervicovaginal ancillary).  Feels the outside vaginal irritation is worse, not helped after taking Diflucan x 1 dose 4 days ago.  She denies any abnormal vaginal bleeding or other concerns. Of note, patient has been evaluated for lower abdominal pain and had several imaging studies over the last several years.    Past Medical History:  Diagnosis Date   Arteritis (HCC)    Arthritis    Atrophic vaginitis 11/16/2014   Carotid artery stenosis    Cataract    Difficult intubation    Environmental and seasonal allergies    GERD (gastroesophageal reflux disease)    Hyperlipidemia    Hypertension    Lack of bladder control    Neuropathy    Osteoporosis    Pneumonia     x 85yrs ago   Pre-diabetes    Shingles    Ulcer     Past Surgical History:  Procedure Laterality Date   ABDOMINAL HYSTERECTOMY  1972   BREAST CYST EXCISION Right 1972   BREAST CYST EXCISION Left 1972   ENDARTERECTOMY Left 12/24/2017   Procedure: ENDARTERECTOMY CAROTID;  Surgeon: Renford Dills, MD;  Location: [facility];  Service: Vascular;  Laterality: Left;   ENDARTERECTOMY Right 03/13/2018   Procedure: ENDARTERECTOMY CAROTID;  Surgeon: Renford Dills, MD;  Location: [facility];  Service: Vascular;  Laterality: Right;   ESOPHAGOGASTRODUODENOSCOPY (EGD) WITH PROPOFOL N/A 11/28/2020   Procedure: ESOPHAGOGASTRODUODENOSCOPY (EGD) WITH PROPOFOL;  Surgeon: Midge Minium, MD;  Location: [facility];  Service: Endoscopy;  Laterality: N/A;   EYE SURGERY      RENAL ANGIOGRAPHY N/A 03/05/2022   Procedure: RENAL ANGIOGRAPHY;  Surgeon: Renford Dills, MD;  Location: [facility] IN[facility];  Service: Cardiovascular;  Laterality: N/A;   THROAT SURGERY     x 10 yrs ago. throat mass.   TONSILLECTOMY     The following portions of the patient's history were reviewed and updated as appropriate: allergies, current medications, past family history, past medical history, past social history, past surgical history and problem list.   Review of Systems:  Pertinent items noted in HPI and remainder of comprehensive ROS otherwise negative.  Physical Exam:  BP (!) 147/70   Pulse 60   Wt 138 lb 3.2 oz (62.7 kg)   BMI 21.65 kg/m  CONSTITUTIONAL: Well-developed, well-nourished female in no acute distress.  MUSCULOSKELETAL: Normal range of motion. No edema noted. NEUROLOGIC: Alert and oriented to person, place, and time. Normal muscle tone coordination. No cranial nerve deficit noted. PSYCHIATRIC: Normal mood and affect. Normal behavior. Normal judgment and thought content. CARDIOVASCULAR: Normal heart rate noted RESPIRATORY: Effort and breath sounds normal, no problems with respiration noted ABDOMEN: Mild lower abdominal tenderness to palpation in suprapubic area. No masses noted. No other overt distention noted.   PELVIC: Atrophic external genitalia. Clear discharge noted, testing sample obtained. Performed in the presence of a chaperone  Results for orders placed or performed in visit on 06/30/23 (from the past 336 hour(s))  Cervicovaginal ancillary only( [place])   Collection Time: 06/30/23  1:49 PM  Result Value Ref Range   Bacterial Vaginitis (gardnerella) Other    Candida Vaginitis Other    Candida Glabrata Other    Molecular Comment      Quantity Not Sufficient. Insufficient material to adequately perform   Molecular Comment test.    Comment      Normal Reference Range Bacterial Vaginosis - Negative   Comment Normal Reference Range Candida  Species - Negative    Comment Normal Reference Range Candida Galbrata - Negative   Urine Culture   Collection Time: 06/30/23  2:14 PM   Specimen: Urine, Clean Catch   Urine  Result Value Ref Range   Urine Culture, Routine Final report    Organism ID, Bacteria Comment       Assessment and Plan:     1. Vulvovaginitis Unsure etiology for her symptoms, repeat testing obtained, will follow up results and manage accordingly. For now, recommended Clobetasol trial, will see if this helps. Also told to apply Premarin cream as instructed on previous visit as this could help with any atrophy related discomfort. Proper vulvar hygiene recommended. - Cervicovaginal ancillary only - clobetasol cream (TEMOVATE) 0.05 %; Apply 1 Application topically 2 (two) times daily. Apply to affected area  Dispense: 60 g; Refill: 2  2. Lower abdominal pain, unspecified Unsure etiology. Recommended OTC pain meds as needed. Will follow up results and manage accordingly. If pain worsens/continues, will need further evaluation.   Return for follow up as recommended.    I spent 25 minutes dedicated to the care of this patient including pre-visit review of records, face to face time with the patient discussing her conditions and treatments and post visit orders.    Jaynie Collins, MD, FACOG Obstetrician & Gynecologist, [facility] for [employer], [facility]

## 2023-07-09 LAB — CERVICOVAGINAL ANCILLARY ONLY
Bacterial Vaginitis (gardnerella): NEGATIVE
Candida Glabrata: NEGATIVE
Candida Vaginitis: NEGATIVE
Comment: NEGATIVE
Comment: NEGATIVE
Comment: NEGATIVE

## 2023-08-12 ENCOUNTER — Inpatient Hospital Stay
Admission: EM | Admit: 2023-08-12 | Discharge: 2023-08-16 | DRG: 871 | Disposition: A | Discharge status: Home Health | Payer: Medicare Other | Attending: Family Medicine | Admitting: Family Medicine

## 2023-08-12 DIAGNOSIS — Z833 Family history of diabetes mellitus: Secondary | ICD-10-CM

## 2023-08-12 DIAGNOSIS — R6520 Severe sepsis without septic shock: Secondary | ICD-10-CM | POA: Diagnosis present

## 2023-08-12 DIAGNOSIS — K219 Gastro-esophageal reflux disease without esophagitis: Secondary | ICD-10-CM | POA: Diagnosis present

## 2023-08-12 DIAGNOSIS — Z1152 Encounter for screening for COVID-19: Secondary | ICD-10-CM

## 2023-08-12 DIAGNOSIS — E538 Deficiency of other specified B group vitamins: Secondary | ICD-10-CM | POA: Diagnosis present

## 2023-08-12 DIAGNOSIS — M8580 Other specified disorders of bone density and structure, unspecified site: Secondary | ICD-10-CM | POA: Diagnosis present

## 2023-08-12 DIAGNOSIS — Z880 Allergy status to penicillin: Secondary | ICD-10-CM

## 2023-08-12 DIAGNOSIS — J1100 Influenza due to unidentified influenza virus with unspecified type of pneumonia: Secondary | ICD-10-CM | POA: Diagnosis present

## 2023-08-12 DIAGNOSIS — Z882 Allergy status to sulfonamides status: Secondary | ICD-10-CM

## 2023-08-12 DIAGNOSIS — Z91040 Latex allergy status: Secondary | ICD-10-CM

## 2023-08-12 DIAGNOSIS — N1831 Chronic kidney disease, stage 3a: Secondary | ICD-10-CM | POA: Diagnosis present

## 2023-08-12 DIAGNOSIS — J9601 Acute respiratory failure with hypoxia: Secondary | ICD-10-CM | POA: Diagnosis present

## 2023-08-12 DIAGNOSIS — J101 Influenza due to other identified influenza virus with other respiratory manifestations: Secondary | ICD-10-CM

## 2023-08-12 DIAGNOSIS — E1122 Type 2 diabetes mellitus with diabetic chronic kidney disease: Secondary | ICD-10-CM | POA: Diagnosis present

## 2023-08-12 DIAGNOSIS — Z79899 Other long term (current) drug therapy: Secondary | ICD-10-CM

## 2023-08-12 DIAGNOSIS — Z800 Family history of malignant neoplasm of digestive organs: Secondary | ICD-10-CM

## 2023-08-12 DIAGNOSIS — Z801 Family history of malignant neoplasm of trachea, bronchus and lung: Secondary | ICD-10-CM

## 2023-08-12 DIAGNOSIS — Z7952 Long term (current) use of systemic steroids: Secondary | ICD-10-CM

## 2023-08-12 DIAGNOSIS — J1008 Influenza due to other identified influenza virus with other specified pneumonia: Secondary | ICD-10-CM | POA: Diagnosis present

## 2023-08-12 DIAGNOSIS — A419 Sepsis, unspecified organism: Secondary | ICD-10-CM | POA: Diagnosis not present

## 2023-08-12 DIAGNOSIS — I5A Non-ischemic myocardial injury (non-traumatic): Secondary | ICD-10-CM | POA: Diagnosis present

## 2023-08-12 DIAGNOSIS — E785 Hyperlipidemia, unspecified: Secondary | ICD-10-CM | POA: Diagnosis present

## 2023-08-12 DIAGNOSIS — E876 Hypokalemia: Secondary | ICD-10-CM

## 2023-08-12 DIAGNOSIS — G9341 Metabolic encephalopathy: Secondary | ICD-10-CM | POA: Diagnosis present

## 2023-08-12 DIAGNOSIS — Z885 Allergy status to narcotic agent status: Secondary | ICD-10-CM

## 2023-08-12 DIAGNOSIS — Z90710 Acquired absence of both cervix and uterus: Secondary | ICD-10-CM

## 2023-08-12 DIAGNOSIS — R7989 Other specified abnormal findings of blood chemistry: Secondary | ICD-10-CM

## 2023-08-12 DIAGNOSIS — Z7982 Long term (current) use of aspirin: Secondary | ICD-10-CM

## 2023-08-12 DIAGNOSIS — Z7902 Long term (current) use of antithrombotics/antiplatelets: Secondary | ICD-10-CM

## 2023-08-12 DIAGNOSIS — Z8042 Family history of malignant neoplasm of prostate: Secondary | ICD-10-CM

## 2023-08-12 DIAGNOSIS — I2489 Other forms of acute ischemic heart disease: Secondary | ICD-10-CM

## 2023-08-12 DIAGNOSIS — R4182 Altered mental status, unspecified: Secondary | ICD-10-CM

## 2023-08-12 DIAGNOSIS — D696 Thrombocytopenia, unspecified: Secondary | ICD-10-CM | POA: Diagnosis present

## 2023-08-12 DIAGNOSIS — Z888 Allergy status to other drugs, medicaments and biological substances status: Secondary | ICD-10-CM

## 2023-08-12 DIAGNOSIS — Z794 Long term (current) use of insulin: Secondary | ICD-10-CM

## 2023-08-12 DIAGNOSIS — J189 Pneumonia, unspecified organism: Secondary | ICD-10-CM

## 2023-08-12 DIAGNOSIS — I129 Hypertensive chronic kidney disease with stage 1 through stage 4 chronic kidney disease, or unspecified chronic kidney disease: Secondary | ICD-10-CM | POA: Diagnosis present

## 2023-08-12 DIAGNOSIS — E1142 Type 2 diabetes mellitus with diabetic polyneuropathy: Secondary | ICD-10-CM | POA: Diagnosis present

## 2023-08-12 MED ORDER — ACETAMINOPHEN 500 MG PO TABS
1000.0000 mg | ORAL_TABLET | Freq: Once | ORAL | Status: AC
  Administered 2023-08-13: 1000 mg via ORAL
  Filled 2023-08-12: qty 2

## 2023-08-12 MED ORDER — SODIUM CHLORIDE 0.9 % IV SOLN
2.0000 g | Freq: Once | INTRAVENOUS | Status: AC
  Administered 2023-08-13: 2 g via INTRAVENOUS
  Filled 2023-08-12: qty 12.5

## 2023-08-12 MED ORDER — LACTATED RINGERS IV BOLUS (SEPSIS): 1000.0000 mL | Freq: Once | INTRAVENOUS | Status: DC

## 2023-08-12 MED ORDER — VANCOMYCIN HCL IN DEXTROSE 1-5 GM/200ML-% IV SOLN
1000.0000 mg | Freq: Once | INTRAVENOUS | Status: AC
  Administered 2023-08-13: 1000 mg via INTRAVENOUS
  Filled 2023-08-12: qty 200

## 2023-08-13 ENCOUNTER — Encounter: Payer: Self-pay | Admitting: Emergency Medicine

## 2023-08-13 ENCOUNTER — Emergency Department: Payer: Medicare Other

## 2023-08-13 ENCOUNTER — Other Ambulatory Visit: Payer: Self-pay

## 2023-08-13 DIAGNOSIS — I5A Non-ischemic myocardial injury (non-traumatic): Secondary | ICD-10-CM | POA: Diagnosis present

## 2023-08-13 DIAGNOSIS — E785 Hyperlipidemia, unspecified: Secondary | ICD-10-CM | POA: Diagnosis present

## 2023-08-13 DIAGNOSIS — Z8042 Family history of malignant neoplasm of prostate: Secondary | ICD-10-CM | POA: Diagnosis not present

## 2023-08-13 DIAGNOSIS — Z7902 Long term (current) use of antithrombotics/antiplatelets: Secondary | ICD-10-CM | POA: Diagnosis not present

## 2023-08-13 DIAGNOSIS — J1100 Influenza due to unidentified influenza virus with unspecified type of pneumonia: Secondary | ICD-10-CM

## 2023-08-13 DIAGNOSIS — R6520 Severe sepsis without septic shock: Secondary | ICD-10-CM | POA: Diagnosis present

## 2023-08-13 DIAGNOSIS — G9341 Metabolic encephalopathy: Secondary | ICD-10-CM | POA: Diagnosis present

## 2023-08-13 DIAGNOSIS — Z1152 Encounter for screening for COVID-19: Secondary | ICD-10-CM | POA: Diagnosis not present

## 2023-08-13 DIAGNOSIS — D696 Thrombocytopenia, unspecified: Secondary | ICD-10-CM | POA: Diagnosis present

## 2023-08-13 DIAGNOSIS — N1831 Chronic kidney disease, stage 3a: Secondary | ICD-10-CM | POA: Diagnosis present

## 2023-08-13 DIAGNOSIS — I2489 Other forms of acute ischemic heart disease: Secondary | ICD-10-CM | POA: Diagnosis present

## 2023-08-13 DIAGNOSIS — E538 Deficiency of other specified B group vitamins: Secondary | ICD-10-CM | POA: Diagnosis present

## 2023-08-13 DIAGNOSIS — Z801 Family history of malignant neoplasm of trachea, bronchus and lung: Secondary | ICD-10-CM | POA: Diagnosis not present

## 2023-08-13 DIAGNOSIS — A419 Sepsis, unspecified organism: Secondary | ICD-10-CM | POA: Diagnosis present

## 2023-08-13 DIAGNOSIS — Z794 Long term (current) use of insulin: Secondary | ICD-10-CM | POA: Diagnosis not present

## 2023-08-13 DIAGNOSIS — J189 Pneumonia, unspecified organism: Secondary | ICD-10-CM | POA: Diagnosis not present

## 2023-08-13 DIAGNOSIS — I129 Hypertensive chronic kidney disease with stage 1 through stage 4 chronic kidney disease, or unspecified chronic kidney disease: Secondary | ICD-10-CM | POA: Diagnosis present

## 2023-08-13 DIAGNOSIS — Z800 Family history of malignant neoplasm of digestive organs: Secondary | ICD-10-CM | POA: Diagnosis not present

## 2023-08-13 DIAGNOSIS — J9601 Acute respiratory failure with hypoxia: Secondary | ICD-10-CM | POA: Diagnosis present

## 2023-08-13 DIAGNOSIS — J1008 Influenza due to other identified influenza virus with other specified pneumonia: Secondary | ICD-10-CM | POA: Diagnosis present

## 2023-08-13 DIAGNOSIS — E1122 Type 2 diabetes mellitus with diabetic chronic kidney disease: Secondary | ICD-10-CM | POA: Diagnosis present

## 2023-08-13 DIAGNOSIS — K219 Gastro-esophageal reflux disease without esophagitis: Secondary | ICD-10-CM | POA: Diagnosis present

## 2023-08-13 DIAGNOSIS — Z833 Family history of diabetes mellitus: Secondary | ICD-10-CM | POA: Diagnosis not present

## 2023-08-13 DIAGNOSIS — E876 Hypokalemia: Secondary | ICD-10-CM | POA: Diagnosis present

## 2023-08-13 DIAGNOSIS — E1142 Type 2 diabetes mellitus with diabetic polyneuropathy: Secondary | ICD-10-CM | POA: Diagnosis present

## 2023-08-13 DIAGNOSIS — Z90710 Acquired absence of both cervix and uterus: Secondary | ICD-10-CM | POA: Diagnosis not present

## 2023-08-13 LAB — COMPREHENSIVE METABOLIC PANEL
ALT: 12 U/L (ref 0–44)
AST: 20 U/L (ref 15–41)
Albumin: 3.8 g/dL (ref 3.5–5.0)
Alkaline Phosphatase: 56 U/L (ref 38–126)
Anion gap: 10 (ref 5–15)
BUN: 13 mg/dL (ref 8–23)
CO2: 24 mmol/L (ref 22–32)
Calcium: 9.6 mg/dL (ref 8.9–10.3)
Chloride: 103 mmol/L (ref 98–111)
Creatinine, Ser: 0.99 mg/dL (ref 0.44–1.00)
GFR, Estimated: 55 mL/min — ABNORMAL LOW (ref >60)
Glucose, Bld: 124 mg/dL — ABNORMAL HIGH (ref 70–99)
Potassium: 3.1 mmol/L — ABNORMAL LOW (ref 3.5–5.1)
Sodium: 137 mmol/L (ref 135–145)
Total Bilirubin: 0.5 mg/dL (ref 0.0–1.2)
Total Protein: 6.6 g/dL (ref 6.5–8.1)

## 2023-08-13 LAB — CBC
HCT: 28.3 % — ABNORMAL LOW (ref 36.0–46.0)
Hemoglobin: 9 g/dL — ABNORMAL LOW (ref 12.0–15.0)
MCH: 30.5 pg (ref 26.0–34.0)
MCHC: 31.8 g/dL (ref 30.0–36.0)
MCV: 95.9 fL (ref 80.0–100.0)
Platelets: 140 10*3/uL — ABNORMAL LOW (ref 150–400)
RBC: 2.95 MIL/uL — ABNORMAL LOW (ref 3.87–5.11)
RDW: 13 % (ref 11.5–15.5)
WBC: 4.7 10*3/uL (ref 4.0–10.5)
nRBC: 0 % (ref 0.0–0.2)

## 2023-08-13 LAB — CBC WITH DIFFERENTIAL/PLATELET
Abs Immature Granulocytes: 0.01 10*3/uL (ref 0.00–0.07)
Basophils Absolute: 0 10*3/uL (ref 0.0–0.1)
Basophils Relative: 0 %
Eosinophils Absolute: 0 10*3/uL (ref 0.0–0.5)
Eosinophils Relative: 1 %
HCT: 33.8 % — ABNORMAL LOW (ref 36.0–46.0)
Hemoglobin: 10.9 g/dL — ABNORMAL LOW (ref 12.0–15.0)
Immature Granulocytes: 0 %
Lymphocytes Relative: 8 %
Lymphs Abs: 0.5 10*3/uL — ABNORMAL LOW (ref 0.7–4.0)
MCH: 30.4 pg (ref 26.0–34.0)
MCHC: 32.2 g/dL (ref 30.0–36.0)
MCV: 94.4 fL (ref 80.0–100.0)
Monocytes Absolute: 0.8 10*3/uL (ref 0.1–1.0)
Monocytes Relative: 14 %
Neutro Abs: 4.5 10*3/uL (ref 1.7–7.7)
Neutrophils Relative %: 77 %
Platelets: 173 10*3/uL (ref 150–400)
RBC: 3.58 MIL/uL — ABNORMAL LOW (ref 3.87–5.11)
RDW: 12.7 % (ref 11.5–15.5)
WBC: 5.9 10*3/uL (ref 4.0–10.5)
nRBC: 0 % (ref 0.0–0.2)

## 2023-08-13 LAB — BASIC METABOLIC PANEL
Anion gap: 5 (ref 5–15)
Anion gap: 9 (ref 5–15)
BUN: 10 mg/dL (ref 8–23)
BUN: 10 mg/dL (ref 8–23)
CO2: 22 mmol/L (ref 22–32)
CO2: 23 mmol/L (ref 22–32)
Calcium: 7.7 mg/dL — ABNORMAL LOW (ref 8.9–10.3)
Calcium: 8.5 mg/dL — ABNORMAL LOW (ref 8.9–10.3)
Chloride: 112 mmol/L — ABNORMAL HIGH (ref 98–111)
Chloride: 109 mmol/L (ref 98–111)
Creatinine, Ser: 0.76 mg/dL (ref 0.44–1.00)
Creatinine, Ser: 0.95 mg/dL (ref 0.44–1.00)
GFR, Estimated: >60 mL/min (ref >60)
GFR, Estimated: 58 mL/min — ABNORMAL LOW (ref >60)
Glucose, Bld: 110 mg/dL — ABNORMAL HIGH (ref 70–99)
Glucose, Bld: 109 mg/dL — ABNORMAL HIGH (ref 70–99)
Potassium: 3.1 mmol/L — ABNORMAL LOW (ref 3.5–5.1)
Potassium: 3.6 mmol/L (ref 3.5–5.1)
Sodium: 139 mmol/L (ref 135–145)
Sodium: 141 mmol/L (ref 135–145)

## 2023-08-13 LAB — URINALYSIS, W/ REFLEX TO CULTURE (INFECTION SUSPECTED)
Bilirubin Urine: NEGATIVE
Glucose, UA: NEGATIVE mg/dL
Ketones, ur: NEGATIVE mg/dL
Leukocytes,Ua: NEGATIVE
Nitrite: NEGATIVE
Protein, ur: 100 mg/dL — AB
Specific Gravity, Urine: 1.013 (ref 1.005–1.030)
Squamous Epithelial / HPF: 0 /[HPF] (ref 0–5)
pH: 5 (ref 5.0–8.0)

## 2023-08-13 LAB — RESP PANEL BY RT-PCR (RSV, FLU A&B, COVID)  RVPGX2
Influenza A by PCR: POSITIVE — AB
Influenza B by PCR: NEGATIVE
Resp Syncytial Virus by PCR: NEGATIVE
SARS Coronavirus 2 by RT PCR: NEGATIVE

## 2023-08-13 LAB — C DIFFICILE QUICK SCREEN W PCR REFLEX
C Diff antigen: NEGATIVE
C Diff interpretation: NOT DETECTED
C Diff toxin: NEGATIVE

## 2023-08-13 LAB — MRSA NEXT GEN BY PCR, NASAL: MRSA by PCR Next Gen: DETECTED — AB

## 2023-08-13 LAB — PROTIME-INR
INR: 1.2 (ref 0.8–1.2)
Prothrombin Time: 14.9 s (ref 11.4–15.2)

## 2023-08-13 LAB — TROPONIN I (HIGH SENSITIVITY)
Troponin I (High Sensitivity): 130 ng/L (ref <18)
Troponin I (High Sensitivity): 137 ng/L (ref <18)
Troponin I (High Sensitivity): 149 ng/L (ref <18)
Troponin I (High Sensitivity): 60 ng/L — ABNORMAL HIGH (ref <18)

## 2023-08-13 LAB — STREP PNEUMONIAE URINARY ANTIGEN: Strep Pneumo Urinary Antigen: NEGATIVE

## 2023-08-13 LAB — LACTIC ACID, PLASMA: Lactic Acid, Venous: 1.5 mmol/L (ref 0.5–1.9)

## 2023-08-13 LAB — LIPASE, BLOOD: Lipase: 25 U/L (ref 11–51)

## 2023-08-13 LAB — PROCALCITONIN: Procalcitonin: 0.11 ng/mL

## 2023-08-13 MED ORDER — ACETAMINOPHEN 650 MG RE SUPP: 650.0000 mg | Freq: Four times a day (QID) | RECTAL | Status: DC | PRN

## 2023-08-13 MED ORDER — ONDANSETRON HCL 4 MG/2ML IJ SOLN
4.0000 mg | INTRAMUSCULAR | Status: AC
  Administered 2023-08-13: 4 mg via INTRAVENOUS
  Filled 2023-08-13: qty 2

## 2023-08-13 MED ORDER — OSELTAMIVIR PHOSPHATE 30 MG PO CAPS
30.0000 mg | ORAL_CAPSULE | Freq: Two times a day (BID) | ORAL | Status: DC
  Administered 2023-08-13 – 2023-08-16 (×7): 30 mg via ORAL
  Filled 2023-08-13 (×8): qty 1

## 2023-08-13 MED ORDER — ENOXAPARIN SODIUM 40 MG/0.4ML IJ SOSY
40.0000 mg | PREFILLED_SYRINGE | INTRAMUSCULAR | Status: DC
  Administered 2023-08-13 – 2023-08-16 (×4): 40 mg via SUBCUTANEOUS
  Filled 2023-08-13 (×4): qty 0.4

## 2023-08-13 MED ORDER — POTASSIUM CHLORIDE CRYS ER 20 MEQ PO TBCR
40.0000 meq | EXTENDED_RELEASE_TABLET | ORAL | Status: AC
  Administered 2023-08-13 (×2): 40 meq via ORAL
  Filled 2023-08-13 (×2): qty 2

## 2023-08-13 MED ORDER — VANCOMYCIN HCL 750 MG/150ML IV SOLN
750.0000 mg | INTRAVENOUS | Status: DC
Start: 2023-08-14 — End: 2023-08-13

## 2023-08-13 MED ORDER — ALPRAZOLAM 0.25 MG PO TABS
0.2500 mg | ORAL_TABLET | Freq: Every evening | ORAL | Status: DC | PRN
  Administered 2023-08-13: 0.25 mg via ORAL
  Filled 2023-08-13: qty 1

## 2023-08-13 MED ORDER — VANCOMYCIN HCL 500 MG/100ML IV SOLN
500.0000 mg | Freq: Once | INTRAVENOUS | Status: DC
  Filled 2023-08-13: qty 100

## 2023-08-13 MED ORDER — HYDRALAZINE HCL 20 MG/ML IJ SOLN
5.0000 mg | Freq: Once | INTRAMUSCULAR | Status: AC
  Administered 2023-08-13: 5 mg via INTRAVENOUS
  Filled 2023-08-13: qty 1

## 2023-08-13 MED ORDER — SODIUM CHLORIDE 0.9 % IV SOLN: INTRAVENOUS | Status: DC

## 2023-08-13 MED ORDER — POTASSIUM CHLORIDE CRYS ER 20 MEQ PO TBCR
40.0000 meq | EXTENDED_RELEASE_TABLET | Freq: Once | ORAL | Status: AC
  Administered 2023-08-13: 40 meq via ORAL
  Filled 2023-08-13: qty 2

## 2023-08-13 MED ORDER — OSELTAMIVIR PHOSPHATE 75 MG PO CAPS: 75.0000 mg | ORAL_CAPSULE | Freq: Two times a day (BID) | ORAL | Status: DC

## 2023-08-13 MED ORDER — LOSARTAN POTASSIUM 50 MG PO TABS
50.0000 mg | ORAL_TABLET | Freq: Every day | ORAL | Status: DC
  Administered 2023-08-13 – 2023-08-16 (×4): 50 mg via ORAL
  Filled 2023-08-13 (×4): qty 1

## 2023-08-13 MED ORDER — SODIUM CHLORIDE 0.9 % IV SOLN
500.0000 mg | INTRAVENOUS | Status: DC
  Administered 2023-08-13: 500 mg via INTRAVENOUS
  Filled 2023-08-13 (×2): qty 5

## 2023-08-13 MED ORDER — ONDANSETRON HCL 4 MG/2ML IJ SOLN
4.0000 mg | Freq: Four times a day (QID) | INTRAMUSCULAR | Status: DC | PRN
  Administered 2023-08-13: 4 mg via INTRAVENOUS
  Filled 2023-08-13 (×2): qty 2

## 2023-08-13 MED ORDER — VANCOMYCIN HCL 1500 MG/300ML IV SOLN
1500.0000 mg | Freq: Once | INTRAVENOUS | Status: DC
  Filled 2023-08-13: qty 300

## 2023-08-13 MED ORDER — ONDANSETRON HCL 4 MG PO TABS: 4.0000 mg | ORAL_TABLET | Freq: Four times a day (QID) | ORAL | Status: DC | PRN

## 2023-08-13 MED ORDER — METOPROLOL TARTRATE 50 MG PO TABS
50.0000 mg | ORAL_TABLET | Freq: Two times a day (BID) | ORAL | Status: DC
  Administered 2023-08-13 – 2023-08-16 (×6): 50 mg via ORAL
  Filled 2023-08-13 (×6): qty 1

## 2023-08-13 MED ORDER — SODIUM CHLORIDE 0.9 % IV BOLUS
1000.0000 mL | Freq: Once | INTRAVENOUS | Status: AC
  Administered 2023-08-13: 1000 mL via INTRAVENOUS

## 2023-08-13 MED ORDER — SODIUM CHLORIDE 0.9 % IV SOLN: INTRAVENOUS | Status: AC

## 2023-08-13 MED ORDER — DM-GUAIFENESIN ER 30-600 MG PO TB12
1.0000 | ORAL_TABLET | Freq: Two times a day (BID) | ORAL | Status: DC
  Administered 2023-08-13 – 2023-08-16 (×7): 1 via ORAL
  Filled 2023-08-13 (×7): qty 1

## 2023-08-13 MED ORDER — SODIUM CHLORIDE 0.9 % IV SOLN
500.0000 mg | Freq: Once | INTRAVENOUS | Status: AC
  Administered 2023-08-13: 500 mg via INTRAVENOUS
  Filled 2023-08-13: qty 5

## 2023-08-13 MED ORDER — SODIUM CHLORIDE 0.9 % IV BOLUS (SEPSIS)
1000.0000 mL | Freq: Once | INTRAVENOUS | Status: AC
  Administered 2023-08-13: 1000 mL via INTRAVENOUS

## 2023-08-13 MED ORDER — LACTATED RINGERS IV SOLN: INTRAVENOUS | Status: DC

## 2023-08-13 MED ORDER — IOHEXOL 300 MG/ML  SOLN
100.0000 mL | Freq: Once | INTRAMUSCULAR | Status: AC | PRN
  Administered 2023-08-13: 100 mL via INTRAVENOUS

## 2023-08-13 MED ORDER — LOPERAMIDE HCL 2 MG PO CAPS
2.0000 mg | ORAL_CAPSULE | Freq: Two times a day (BID) | ORAL | Status: DC | PRN
  Administered 2023-08-13: 2 mg via ORAL
  Filled 2023-08-13: qty 1

## 2023-08-13 MED ORDER — ACETAMINOPHEN 325 MG PO TABS
650.0000 mg | ORAL_TABLET | Freq: Four times a day (QID) | ORAL | Status: DC | PRN
  Administered 2023-08-13 (×2): 650 mg via ORAL
  Filled 2023-08-13 (×3): qty 2

## 2023-08-13 MED ORDER — SODIUM CHLORIDE 0.9 % IV SOLN
2.0000 g | INTRAVENOUS | Status: DC
  Administered 2023-08-13 – 2023-08-15 (×3): 2 g via INTRAVENOUS
  Filled 2023-08-13 (×3): qty 20

## 2023-08-13 NOTE — Sepsis Progress Note (Signed)
 Elink monitoring for the code sepsis protocol.

## 2023-08-13 NOTE — ED Notes (Signed)
 Patient repositioned in bed and given a lunch tray.

## 2023-08-13 NOTE — Progress Notes (Signed)
 Pharmacy Antibiotic Note  Dawn Mcintyre is a 89 y.o. female admitted on 08/12/2023 with pneumonia. Pharmacy has been consulted for vancomycin  dosing.  Plan: Give vancomycin  500 mg IV x1 to complete total load of 1500 mg IV today Start vancomycin  750 mg IV every 24 hours (eAUC 449.5, Cmin 12.2, Scr 0.95, IBW used, Vd 0.72 L/kg) Patient already receiving ceftriaxone  2 g IV every 24 hours and azithromycin  500 mg IV every 24 hours, per MD Monitor renal function, clinical status, culture data, and LOT  Height: 5' 6 (167.6 cm) IBW/kg (Calculated) : 59.3  Temp (24hrs), Avg:101 F (38.3 C), Min:98.8 F (37.1 C), Max:104 F (40 C)  Recent Labs  Lab 08/12/23 2346 08/13/23 0414 08/13/23 1034  WBC 5.9 4.7  --   CREATININE 0.99 0.76 0.95  LATICACIDVEN 1.5  --   --     CrCl cannot be calculated (Unknown ideal weight.).    Allergies  Allergen Reactions   Codeine Hives   Hydrocodone  Nausea Only   Metronidazole Nausea Only   Propoxyphene Nausea Only   Sulfa  Antibiotics Hives   Amoxicillin-Pot Clavulanate Diarrhea   Latex Rash    Antimicrobials this admission: ceftriaxone  1/7 >>  azithromycin  1/7 >>  Vancomycin  1/8 >>  Dose adjustments this admission: N/A  Microbiology results: 1/8 BCx: NGTD  1/8 MRSA PCR: positive  Thank you for involving pharmacy in this patient's care.   Damien Napoleon, PharmD Clinical Pharmacist 08/13/2023 12:31 PM

## 2023-08-13 NOTE — ED Notes (Signed)
 Patient with loose stool. Pt's bedding changed. Pt is currently on the bedpan.

## 2023-08-13 NOTE — ED Notes (Signed)
 Patient ambulatory with assistance of PT, and had another occurrence of diarrhea.

## 2023-08-13 NOTE — Evaluation (Signed)
 Physical Therapy Evaluation Patient Details Name: Dawn Mcintyre MRN: [id number] DOB: 07/30/1933 Today's Date: 08/13/2023  History of Present Illness  Pt admitted for sepsis secondary to pneumonia with positive flu. History includes HTN, DM, CKD, GERD, and urinary incontience.  Clinical Impression  Pt is a pleasant 89 year old female who was admitted for sepsis secondary to pneumonia and flu. Pt performs bed mobility with min/mod, transfers with min assist, and ambulation with mod assist and therapist hands. Pt demonstrates deficits with strength/mobility. Pt appears weaker R >L LE and has poor safety awareness. Would recommend use of AD and BSC at bedside at this time. Incontinence noted and bed linens changed along with brief. Doesn't appear to be at baseline level at this time. Would benefit from skilled PT to address above deficits and promote optimal return to PLOF. Pt will continue to receive skilled PT services while admitted and will defer to TOC/care team for updates regarding disposition planning.       If plan is discharge home, recommend the following: A lot of help with walking and/or transfers;A lot of help with bathing/dressing/bathroom;Help with stairs or ramp for entrance   Can travel by private vehicle   No    Equipment Recommendations Rolling walker (2 wheels)  Recommendations for Other Services       Functional Status Assessment Patient has had a recent decline in their functional status and demonstrates the ability to make significant improvements in function in a reasonable and predictable amount of time.     Precautions / Restrictions Precautions Precautions: Fall Restrictions Weight Bearing Restrictions Per Provider Order: No      Mobility  Bed Mobility Overal bed mobility: Needs Assistance Bed Mobility: Supine to Sit, Sit to Supine     Supine to sit: Min assist Sit to supine: Mod assist   General bed mobility comments: needs assist for trunkal  elevation and assist for B LE back into bed as well as assistance needed for scooting up towards HOB.    Transfers Overall transfer level: Needs assistance Equipment used: 1 person hand held assist Transfers: Sit to/from Stand Sit to Stand: Min assist           General transfer comment: RW not available, used 2 hand assistance and still required min assist. Unsteady with R side lateral lean    Ambulation/Gait Ambulation/Gait assistance: Mod assist Gait Distance (Feet): 5 Feet Assistive device: 1 person hand held assist Gait Pattern/deviations: Step-to pattern       General Gait Details: pt with increased weakness on R LE with difficulty taking steps. RW not available, therefore 2 hand held assist used. Not safe to attempt further ambulation without +2 assist and AD available.  Stairs            Wheelchair Mobility     Tilt Bed    Modified Rankin (Stroke Patients Only)       Balance Overall balance assessment: Needs assistance Sitting-balance support: Feet supported Sitting balance-Leahy Scale: Good     Standing balance support: Bilateral upper extremity supported Standing balance-Leahy Scale: Poor                               Pertinent Vitals/Pain Pain Assessment Pain Assessment: No/denies pain    Home Living Family/patient expects to be discharged to:: Private residence Living Arrangements: Alone   Type of Home: House Home Access: Level entry       Home Layout: One  level Home Equipment: None      Prior Function Prior Level of Function : Independent/Modified Independent;Driving             Mobility Comments: pt reports zero falls history ADLs Comments: indep     Extremity/Trunk Assessment   Upper Extremity Assessment Upper Extremity Assessment: Generalized weakness (B UE grossly 3+/5)    Lower Extremity Assessment Lower Extremity Assessment: Generalized weakness (R LE grossly 3+/5; L LE grossly 3/5)        Communication   Communication Communication: No apparent difficulties  Cognition Arousal: Alert Behavior During Therapy: WFL for tasks assessed/performed Overall Cognitive Status: Within Functional Limits for tasks assessed                                 General Comments: pleasant and agreeable to session        General Comments      Exercises Other Exercises Other Exercises: Pt needing to urgently use bathroom. Assisted with SPT to [facility]. Needs mod assist for doffing brief/hygiene and transfers on/off BSC.   Assessment/Plan    PT Assessment Patient needs continued PT services  PT Problem List Decreased strength;Decreased activity tolerance;Decreased balance;Decreased mobility;Decreased safety awareness       PT Treatment Interventions DME instruction;Gait training;Therapeutic activities;Therapeutic exercise    PT Goals (Current goals can be found in the Care Plan section)  Acute Rehab PT Goals Patient Stated Goal: to go home PT Goal Formulation: With patient Time For Goal Achievement: 08/27/23 Potential to Achieve Goals: Good    Frequency Min 1X/week     Co-evaluation               AM-PAC PT 6 Clicks Mobility  Outcome Measure Help needed turning from your back to your side while in a flat bed without using bedrails?: A Little Help needed moving from lying on your back to sitting on the side of a flat bed without using bedrails?: A Little Help needed moving to and from a bed to a chair (including a wheelchair)?: A Lot Help needed standing up from a chair using your arms (e.g., wheelchair or bedside chair)?: A Lot Help needed to walk in hospital room?: A Lot Help needed climbing 3-5 steps with a railing? : Total 6 Click Score: 13    End of Session Equipment Utilized During Treatment: Oxygen Activity Tolerance: Patient limited by fatigue Patient left: in bed;with bed alarm set Nurse Communication: Mobility status PT Visit Diagnosis: Muscle  weakness (generalized) (M62.81);Difficulty in walking, not elsewhere classified (R26.2)    Time: 8444-8377 PT Time Calculation (min) (ACUTE ONLY): 27 min   Charges:   PT Evaluation $PT Eval Moderate Complexity: 1 Mod PT Treatments $Therapeutic Activity: 8-22 mins PT General Charges $$ ACUTE PT VISIT: 1 Visit         Corean Dade, PT, DPT, GCS [phone]   Iker Nuttall 08/13/2023, 4:46 PM

## 2023-08-13 NOTE — ED Triage Notes (Signed)
 Pt arrived via [facility] from home where neighbor made well-fair check per request of pts son who spoke with pt earlier and noted AMS from baseline. Pt lives with son but son has been out of town. Per EMS, pt with recent diagnosis of UTI with prescribed antibiotics. EMS reports pt not compliant with medication due to amount left in RX bottles. Pt alert to person and place but disoriented to time and situation.

## 2023-08-13 NOTE — Progress Notes (Signed)
 PHARMACY NOTE:  ANTIMICROBIAL RENAL DOSAGE ADJUSTMENT  Current antimicrobial regimen includes a mismatch between antimicrobial dosage and estimated renal function.  As per policy approved by the Pharmacy & Therapeutics and Medical Executive Committees, the antimicrobial dosage will be adjusted accordingly.  Current antimicrobial dosage:  Oseltamivir  75mg  bid  Indication: influenza  Renal Function:  Estimated Creatinine Clearance: 38.3 mL/min (by C-G formula based on SCr of 0.95 mg/dL). []      On intermittent HD, scheduled: []      On CRRT    Antimicrobial dosage has been changed to:  Oseltamivir  30mg  bid  Additional comments:   Thank you for allowing pharmacy to be a part of this patient's care.  Olam KANDICE Fritter, [facility] 08/13/2023 2:06 PM

## 2023-08-13 NOTE — ED Notes (Signed)
 Patient repositioned in bed, and given a breakfast tray. MD Montez Morita at bedside with patient at this time.

## 2023-08-13 NOTE — ED Notes (Signed)
 Pt with another episode of loose stool. MD Montez Morita notified via secure chat.

## 2023-08-13 NOTE — Progress Notes (Signed)
 CODE SEPSIS - PHARMACY COMMUNICATION  **Broad Spectrum Antibiotics should be administered within 1 hour of Sepsis diagnosis**  Time Code Sepsis Called/Page Received: 0002  Antibiotics Ordered: Cefepime  & Vancomycin   Time of 1st antibiotic administration: 0008  Rankin CANDIE Dills, PharmD, [facility] 08/13/2023 12:08 AM

## 2023-08-13 NOTE — Progress Notes (Signed)
 Progress Note   Patient: Dawn Mcintyre FMW:[id number] DOB: 25-Jan-1933 DOA: 08/12/2023     0 DOS: the patient was seen and examined on 08/13/2023   Brief hospital course: 89 y.o  female with significant PMH of HTN, diet-controlled T2DM, CKD stage III, HDL, GERD, Barrett's esophagus, B12 deficiency, anemia, temporal arteritis, peripheral neuropathy, urinary incontinence and osteopenia who presented to the ED with chief complaints of altered mental status.   Patient's son requested a neighbor to check on the patient, and on their arrival she was found to be confused, lethargic and not acting herself.  Family report she was being treated for urinary tract infection with amoxicillin although per care everywhere she was sent a prescription of cefuroxime 1 tablet (250 mg) twice daily x 5 days and ciprofloxacin  250 mg 1 tablet twice daily x 7 days on 08/05/2023.  Per EMS report it appears that she had not completed her medications as prescribed.   ED Course: Initial vital signs showed HR of 76 beats/minute, BP 163/57 mm Hg, the RR 21 breaths/minute, and the oxygen saturation 91% on RA and a temperature of !)104 F (40 C)   Patient was found to be flu A positive and she was noted to have possible pneumonia. She was started on cefepime  and vancomycin  due to her confusion, fever, and unclear infectious source. She was eventually transitioned to azithromycin  and ceftriaxone  for possible CAP. Her flu A came back positive and she was started on tamiflu    Assessment and Plan: Influenza A infection  Possible CAP  Oxygen requirement  Sepsis resolved  Patient's oxygen requirement likely due to her being flu A positive. Low suspicion for bacterial pneumonia. Will continue antibiotics for now. Ceftriaxone  and azithromycin   Start tamiflu   Acapella/IS Mucinex  DM   Nonischemic myocardial injury in the setting of sepsis  Nothing to do   Normocytic anemia  Mild thrombocytopenia  Will check iron  panel and  vitb12 Transfuse <7hgb   Acute metabolic encephalopathy resolved  Hypertension Appears to be on losartan  100mg  daily, metoprolol  tartrate 100mg  BID, HCTZ12.5mg  daily, and PRN clonidine   -Resume losartan  at reduced dose 50mg , and metop at 50mg  BID   CKD 3 a Appears at baseline.  Continue to monitor      Subjective: Patient's breathing has improved. She does have some chest congestion.   Physical Exam: Vitals:   08/13/23 1545 08/13/23 1600 08/13/23 1615 08/13/23 1630  BP:  (!) 168/52    Pulse: 69 76 76 71  Resp: 20 (!) 24 (!) 22 19  Temp:      TempSrc:      SpO2: 98% 97% 93% 94%  Weight:      Height:       Physical Exam  Constitutional: In no distress. thin Cardiovascular: Normal rate, regular rhythm. No lower extremity edema  Pulmonary: Non labored breathing on [place], no wheezing or rales.   Abdominal: Soft. Normal bowel sounds. Non distended and non tender Musculoskeletal: Normal range of motion.     Neurological: Alert and oriented to person, place, and time. Non focal  Skin: Skin is warm and dry.   Data Reviewed:  I have reviewed all images and labs.      Latest Ref Rng & Units 08/13/2023    4:14 AM 08/12/2023   11:46 PM 05/20/2023    1:49 PM  CBC  WBC 4.0 - 10.5 K/uL 4.7  5.9  8.0   Hemoglobin 12.0 - 15.0 g/dL 9.0  89.0  88.3   Hematocrit 36.0 -  46.0 % 28.3  33.8  36.1   Platelets 150 - 400 K/uL 140  173  218       Latest Ref Rng & Units 08/13/2023   10:34 AM 08/13/2023    4:14 AM 08/12/2023   11:46 PM  BMP  Glucose 70 - 99 mg/dL 890  889  875   BUN 8 - 23 mg/dL 10  10  13    Creatinine 0.44 - 1.00 mg/dL 9.04  9.23  9.00   Sodium 135 - 145 mmol/L 141  139  137   Potassium 3.5 - 5.1 mmol/L 3.6  3.1  3.1   Chloride 98 - 111 mmol/L 109  112  103   CO2 22 - 32 mmol/L 23  22  24    Calcium  8.9 - 10.3 mg/dL 8.5  7.7  9.6      Family Communication: spoke with son.   Disposition: Status is: Inpatient Remains inpatient appropriate because: Oxygen requirement   Planned Discharge Destination:  Pending clinical course   PT/OT  Time spent: 35 minutes  Author: Alban Pepper, MD 08/13/2023 5:08 PM  For on call review [email or web address].

## 2023-08-13 NOTE — ED Provider Notes (Signed)
 [facility] Provider Note    Event Date/Time   First MD Initiated Contact with Patient 08/12/23 2326     (approximate)   History   Altered Mental Status  Level 5 caveat:  history/ROS limited by acute/critical illness  HPI Dawn Mcintyre is a 89 y.o. female who presents for evaluation of altered mental status.  Her nephew was at bedside and confirmed that the patient was very confused earlier, lethargic, not acting herself.  She has been treated for several days for urinary tract infection with amoxicillin.  She has had some nausea but no vomiting but she has also had a thick cough that is causing her to hold an emesis bag and occasionally spit into it.  She denies shortness of breath.  She is not sure if she has had a fever.  She is feeling better now but when she first arrived to the emergency department she was minimally responsive.  She has no weakness in her extremities and denies any pain of any sort including chest pain and abdominal pain.     Physical Exam   Triage Vital Signs: ED Triage Vitals  Encounter Vitals Group     BP 08/12/23 2335 (!) 163/57     Systolic BP Percentile --      Diastolic BP Percentile --      Pulse Rate 08/12/23 2335 76     Resp 08/12/23 2335 (!) 21     Temp 08/12/23 2340 (!) 104 F (40 C)     Temp Source 08/12/23 2340 Rectal     SpO2 08/12/23 2335 91 %     Weight --      Height --      Head Circumference --      Peak Flow --      Pain Score --      Pain Loc --      Pain Education --      Exclude from Growth Chart --     Most recent vital signs: Vitals:   08/13/23 0200 08/13/23 0209  BP: (!) 118/37   Pulse: 80   Resp: (!) 26   Temp:  (!) 101.9 F (38.8 C)  SpO2: 92%     General: Awake, alert, ill-appearing but nontoxic. CV:  Good peripheral perfusion.  Borderline tachycardia with a heart rate greater than 90 (SIRS criterion). Resp:  Normal effort. Speaking easily and comfortably, no accessory muscle usage  nor intercostal retractions.  Lungs are clear to auscultation but the patient is coughing thickly and frequently. Abd:  No distention.  No tenderness to palpation of the abdomen Other:  Patient is currently alert and oriented x 3 although she was not earlier   ED Results / Procedures / Treatments   Labs (all labs ordered are listed, but only abnormal results are displayed) Labs Reviewed  RESP PANEL BY RT-PCR (RSV, FLU A&B, COVID)  RVPGX2 - Abnormal; Notable for the following components:      Result Value   Influenza A by PCR POSITIVE (*)    All other components within normal limits  COMPREHENSIVE METABOLIC PANEL - Abnormal; Notable for the following components:   Potassium 3.1 (*)    Glucose, Bld 124 (*)    GFR, Estimated 55 (*)    All other components within normal limits  CBC WITH DIFFERENTIAL/PLATELET - Abnormal; Notable for the following components:   RBC 3.58 (*)    Hemoglobin 10.9 (*)    HCT 33.8 (*)  Lymphs Abs 0.5 (*)    All other components within normal limits  URINALYSIS, W/ REFLEX TO CULTURE (INFECTION SUSPECTED) - Abnormal; Notable for the following components:   Color, Urine YELLOW (*)    APPearance CLEAR (*)    Hgb urine dipstick MODERATE (*)    Protein, ur 100 (*)    Bacteria, UA RARE (*)    All other components within normal limits  TROPONIN I (HIGH SENSITIVITY) - Abnormal; Notable for the following components:   Troponin I (High Sensitivity) 60 (*)    All other components within normal limits  CULTURE, BLOOD (ROUTINE X 2)  CULTURE, BLOOD (ROUTINE X 2)  URINE CULTURE  LACTIC ACID, PLASMA  PROTIME-INR  LIPASE, BLOOD  LEGIONELLA PNEUMOPHILA SEROGP 1 UR AG  STREP PNEUMONIAE URINARY ANTIGEN  CBC  BASIC METABOLIC PANEL  TROPONIN I (HIGH SENSITIVITY)     EKG  ED ECG REPORT I, Darleene Dome, the attending physician, personally viewed and interpreted this ECG.  Date: 08/13/2023 EKG Time: 2:46 AM Rate: 77 Rhythm: normal sinus rhythm QRS Axis:  normal Intervals: normal ST/T Wave abnormalities: Non-specific ST segment / T-wave changes, but no clear evidence of acute ischemia. Narrative Interpretation: no definitive evidence of acute ischemia; does not meet STEMI criteria.    RADIOLOGY I viewed and interpreted the patient's one-view portable chest x-ray.  No obvious sign of an infiltrate.  I also read the radiologist's report, which confirmed no acute findings.  I also viewed and interpreted the patient's CT of the abdomen and pelvis.  See hospital course for details.   PROCEDURES:  Critical Care performed: Yes, see critical care procedure note(s)  .Critical Care  Performed by: Dome Darleene, MD Authorized by: Dome Darleene, MD   Critical care provider statement:    Critical care time (minutes):  45   Critical care time was exclusive of:  Separately billable procedures and treating other patients   Critical care was necessary to treat or prevent imminent or life-threatening deterioration of the following conditions:  Sepsis   Critical care was time spent personally by me on the following activities:  Development of treatment plan with patient or surrogate, evaluation of patient's response to treatment, examination of patient, obtaining history from patient or surrogate, ordering and performing treatments and interventions, ordering and review of laboratory studies, ordering and review of radiographic studies, pulse oximetry, re-evaluation of patient's condition and review of old charts .1-3 Lead EKG Interpretation  Performed by: Dome Darleene, MD Authorized by: Dome Darleene, MD     Interpretation: normal     ECG rate:  97   ECG rate assessment: normal     Rhythm: sinus rhythm     Ectopy: none     Conduction: normal       IMPRESSION / MDM / ASSESSMENT AND PLAN / ED COURSE  I reviewed the triage vital signs and the nursing notes.                              Differential diagnosis includes, but is not limited to,  sepsis, urinary tract infection, pneumonia, respiratory viral illness, acute intra-abdominal infection such as diverticulitis.  Patient's presentation is most consistent with acute presentation with potential threat to life or bodily function.  Labs/studies ordered: I ordered standard sepsis labs including the following: Respiratory viral panel PCR swab, blood cultures x2, pro time-INR, CMP, urinalysis, urine culture, lactic acid, CBC with differential, high-sensitivity troponin, lipase.  Also ordered  CT abdomen/pelvis, portable chest x-ray  Interventions/Medications given:  Medications  lactated ringers  bolus 1,000 mL ( Intravenous Canceled Entry 08/13/23 0011)  azithromycin  (ZITHROMAX ) 500 mg in sodium chloride  0.9 % 250 mL IVPB (500 mg Intravenous New Bag/Given 08/13/23 0216)  cefTRIAXone  (ROCEPHIN ) 2 g in sodium chloride  0.9 % 100 mL IVPB (has no administration in time range)  azithromycin  (ZITHROMAX ) 500 mg in sodium chloride  0.9 % 250 mL IVPB (has no administration in time range)  enoxaparin  (LOVENOX ) injection 40 mg (has no administration in time range)  0.9 %  sodium chloride  infusion (has no administration in time range)  acetaminophen  (TYLENOL ) tablet 650 mg (has no administration in time range)    Or  acetaminophen  (TYLENOL ) suppository 650 mg (has no administration in time range)  ondansetron  (ZOFRAN ) tablet 4 mg (has no administration in time range)    Or  ondansetron  (ZOFRAN ) injection 4 mg (has no administration in time range)  ceFEPIme  (MAXIPIME ) 2 g in sodium chloride  0.9 % 100 mL IVPB (0 g Intravenous Stopped 08/13/23 0158)  vancomycin  (VANCOCIN ) IVPB 1000 mg/200 mL premix (0 mg Intravenous Stopped 08/13/23 0158)  acetaminophen  (TYLENOL ) tablet 1,000 mg (1,000 mg Oral Given 08/13/23 0013)  sodium chloride  0.9 % bolus 1,000 mL (1,000 mLs Intravenous New Bag/Given 08/13/23 0013)  ondansetron  (ZOFRAN ) injection 4 mg (4 mg Intravenous Given 08/13/23 0046)  iohexol  (OMNIPAQUE ) 300 MG/ML  solution 100 mL (100 mLs Intravenous Contrast Given 08/13/23 0049)  sodium chloride  0.9 % bolus 1,000 mL (0 mLs Intravenous Stopped 08/13/23 0253)  potassium chloride  SA (KLOR-CON  M) CR tablet 40 mEq (40 mEq Oral Given 08/13/23 0216)    (Note:  hospital course my include additional interventions and/or labs/studies not listed above.)   Patient arrives with tachypnea, heart rate greater than 90, and a fever of 104, consistent with sepsis.  I made her a code sepsis and started with 1 L of IV fluids, cefepime  2 g IV, and vancomycin  1 g IV per pharmacy consult.  Initiating full sepsis workup.  Because of the cough I am ordering respiratory viral panel.  She has some mild tenderness to palpation of the abdomen and I will investigate with a CT of the abdomen and pelvis.  Initial labs notable for mild hypokalemia at 3.1 for which I ordered 40 mill equivalents of potassium by mouth.  Troponin at 60 but without chest pain, suspect demand ischemia in the setting of acute infection/sepsis.  Urinalysis shows hemoglobinuria which could reflect the ongoing infection but she is being treated with antibiotics as previously described.  The patient is on the cardiac monitor to evaluate for evidence of arrhythmia and/or significant heart rate changes.   Clinical Course as of 08/13/23 0307  Wed Aug 13, 2023  0158 CT ABDOMEN PELVIS W CONTRAST I viewed and interpreted the patient's CTA of the abdomen and pelvis.  No intra-abdominal issues, but the lungs look like some pneumonia.  Radiologist confirmed probable bronchopneumonia.  Respiratory viral panel positive for influenza.  However besides influenza it seems like she has developed some pneumonia as well.  she is not severe sepsis nor septic shock.  She has already received empiric antibiotics as described above.  I am ordering additional IV fluids to meet the 30 mL/kg IV fluid bolus goal (another 1 L based on her weight of 63 kg).  Consulting the hospitalist for  admission. [CF]  0200 Since before she received cefepime  2 g IV and vancomycin  1 g IV, I will order azithromycin  500 mg IV  as well for community-acquired pneumonia coverage anticipating that she will be transition to a narrow spectrum of treatment. [CF]  0202 Completed sepsis reassessment.  Patient feeling better, not hypotensive, stable. [CF]  0216 Consulted with Dawn Mcintyre with the hospitalist service.  She will admit the patient. [CF]    Clinical Course User Index [CF] Gordan Huxley, MD     FINAL CLINICAL IMPRESSION(S) / ED DIAGNOSES   Final diagnoses:  Sepsis without acute organ dysfunction, due to unspecified organism [facility])  Community acquired pneumonia, unspecified laterality  Influenza A  Altered mental status, unspecified altered mental status type  Hypokalemia  Elevated troponin level  Demand ischemia (HCC)     Rx / DC Orders   ED Discharge Orders     None        Note:  This document was prepared using Dragon voice recognition software and may include unintentional dictation errors.   Gordan Huxley, MD 08/13/23 [phone]

## 2023-08-13 NOTE — IPAL (Signed)
  Interdisciplinary Goals of Care Family Meeting   Date carried out: 08/13/2023  Location of the meeting: Phone conference  Member's involved: Nurse Practitioner and Family Member or next of kin  Durable Power of Attorney or acting medical decision makerBETHA Mcintyre, Home Phone: [phone]   Discussion: Advance Care Planning/Goals of Care discussion was performed during the course of treatment to decide on type of care right for this patient following admission to the hospital as follows:  Code status:   Code Status: Full Code   Disposition: Continue current acute care  Family are satisfied with Plan of action and management. All questions answered     Total Time Spent Face to Face addressing advance care planning in the presence of the Patient: 35 minutes       Dawn Nose, DNP, FNP-C, AGACNP-BC Acute Care Nurse Practitioner [facility] Pager: [phone] Dawn Mcintyre at [facility]

## 2023-08-13 NOTE — H&P (Signed)
 HISTORY AND PHYSICAL    NAME:  Dawn Mcintyre,  MRN:  [id number],  DOB:  29-Jun-1933,  LOS: 0 ADMISSION DATE:  08/12/2023,  REFERRING MD:  Gordan Huxley,  CHIEF COMPLAINT:  Altered Mental Status   HPI  89 y.o  female with significant PMH of HTN, diet-controlled T2DM, CKD stage III, HDL, GERD, Barrett's esophagus, B12 deficiency, anemia, temporal arteritis, peripheral neuropathy, urinary incontinence and osteopenia who presented to the ED with chief complaints of altered mental status.  Per ED reports, patient lives with her son who was currently out of town.  Patient's son requested a neighbor to check on the patient, and on their arrival she was found to be confused, lethargic and not acting herself.  Family report she was being treated for urinary tract infection with amoxicillin although per care everywhere she was sent a prescription of cefuroxime 1 tablet (250 mg) twice daily x 5 days and ciprofloxacin  250 mg 1 tablet twice daily x 7 days on 08/05/2023.  Per EMS report it appears that she had not completed her medications as prescribed.  ED Course: Initial vital signs showed HR of 76 beats/minute, BP 163/57 mm Hg, the RR 21 breaths/minute, and the oxygen saturation 91% on RA and a temperature of !)104 F (40 C)   Pertinent Labs/Diagnostics Findings: Na+/ K+: 137/3.1 Glucose: 124  WBC: 5.9 K/L without bands or neutrophil predominance   Hgb/Hct: 10.9/33.8  PCT: negative <0.10  Lactic acid:1.5  COVID PCR: Negative,  Influenza A + Troponin: 60~137 UA-Rare bacteria CXR> no active disease CT Abd/pelvis> infiltrates in the lower lobes concerning for bronchopneumonia and or aspiration otherwise negative. ED Treatment:Patient given 30 cc/kg of fluids and started on broad-spectrum antibiotics Vanco cefepime  and Azithromycin  for sepsis due to Influenza A pneumonia. Disposition: Admitted to MedSurg unit  Past Medical History  HTN, diet-controlled T2DM, CKD stage III, HDL, GERD, Barrett's  esophagus, B12 deficiency, anemia, temporal arteritis, peripheral neuropathy, urinary incontinence and osteopenia   Significant Hospital Events   08/13/23: Admit to MedSurg unit with sepsis secondary to influenza A Bronchopneumonia  Consults:  None  Interim History / Subjective:  -Mental status slightly improved   Significant Diagnostic Tests:  08/13/23: Chest Xray> IMPRESSION: No active disease.  08/13/23: CTA abdomen and pelvis> IMPRESSION: 1. No acute abnormality in the abdomen or pelvis. 2. Bronchial wall thickening and infiltrates in the lower lobes likely due to bronchopneumonia and/or aspiration. 3. Extensive sigmoid diverticulosis without diverticulitis.  Micro Data:  1/7: SARS-CoV-2 PCR> negative 1/7: Influenza A PCR> POSITIVE 1/7: Blood culture x2> 1/8: MRSA PCR>>  1/8: Strep pneumo urinary antigen> 1/8: Legionella urinary antigen>  Antimicrobials:  Vancomycin   x 1 in the ED Cefepime  x 1 in the ED Azithromycin  1/8> Ceftriaxone  1/8>  OBJECTIVE  Blood pressure (!) 118/37, pulse 80, temperature (!) 101.9 F (38.8 C), temperature source Rectal, resp. rate (!) 26, height 5' 6 (1.676 m), SpO2 92%.       No intake or output data in the 24 hours ending 08/13/23 0235 There were no vitals filed for this visit.  Physical Examination  GENERAL: 89 year-old ill appearing patient lying in the bed in no acute distress EYES: PEERLA. No scleral icterus. Extraocular muscles intact.  HEENT: Head atraumatic, normocephalic. Oropharynx and nasopharynx clear.  NECK:  No JVD, supple  LUNGS: Decreased breath sounds bilaterally.  No use of accessory muscles of respiration.  CARDIOVASCULAR: S1, S2 normal. No murmurs, rubs, or gallops.  ABDOMEN: Soft, NTND EXTREMITIES: No swelling or erythema.  Capillary refill >3 seconds in all extremities. Pulses palpable distally. NEUROLOGIC: The patient is oriented to person and place . No focal neurological deficit appreciated. Cranial nerves are  intact.  SKIN: No obvious rash, lesion, or ulcer. Warm to touch Labs/imaging that I havepersonally reviewed  (right click and Reselect all SmartList Selections daily)  ED ECG REPORT I, Almarie Nose, NP , personally viewed and interpreted this ECG.   Date: 08/13/2023 EKG Time: 2:46 AM Rate: 77 Rhythm: normal sinus rhythm QRS Axis: normal Intervals: normal ST/T Wave abnormalities: Non-specific ST segment / T-wave changes, but no clear evidence of acute ischemia. Narrative Interpretation: no definitive evidence of acute ischemia; does not meet STEMI criteria.    Labs   CBC: Recent Labs  Lab 08/12/23 2346  WBC 5.9  NEUTROABS 4.5  HGB 10.9*  HCT 33.8*  MCV 94.4  PLT 173    Basic Metabolic Panel: Recent Labs  Lab 08/12/23 2346  NA 137  K 3.1*  CL 103  CO2 24  GLUCOSE 124*  BUN 13  CREATININE 0.99  CALCIUM  9.6   GFR: CrCl cannot be calculated (Unknown ideal weight.). Recent Labs  Lab 08/12/23 2346  WBC 5.9  LATICACIDVEN 1.5    Liver Function Tests: Recent Labs  Lab 08/12/23 2346  AST 20  ALT 12  ALKPHOS 56  BILITOT 0.5  PROT 6.6  ALBUMIN 3.8   Recent Labs  Lab 08/12/23 2346  LIPASE 25   No results for input(s): AMMONIA in the last 168 hours.  ABG No results found for: PHART, PCO2ART, PO2ART, HCO3, TCO2, ACIDBASEDEF, O2SAT   Coagulation Profile: Recent Labs  Lab 08/12/23 2346  INR 1.2    Cardiac Enzymes: No results for input(s): CKTOTAL, CKMB, CKMBINDEX, TROPONINI in the last 168 hours.  HbA1C: Hgb A1c MFr Bld  Date/Time Value Ref Range Status  10/28/2021 04:15 AM 6.0 (H) 4.8 - 5.6 % Final    Comment:    (NOTE) Pre diabetes:          5.7%-6.4%  Diabetes:              >6.4%  Glycemic control for   <7.0% adults with diabetes     CBG: No results for input(s): GLUCAP in the last 168 hours.  Review of Systems:   Unable to be obtained secondary to the patient's altered mental status.   Past Medical  History  She,  has a past medical history of Arteritis (HCC), Arthritis, Atrophic vaginitis (11/16/2014), Carotid artery stenosis, Cataract, Difficult intubation, Environmental and seasonal allergies, GERD (gastroesophageal reflux disease), Hyperlipidemia, Hypertension, Lack of bladder control, Neuropathy, Osteoporosis, Pneumonia, Pre-diabetes, Shingles, and Ulcer.   Surgical History    Past Surgical History:  Procedure Laterality Date   ABDOMINAL HYSTERECTOMY  1972   BREAST CYST EXCISION Right 1972   BREAST CYST EXCISION Left 1972   ENDARTERECTOMY Left 12/24/2017   Procedure: ENDARTERECTOMY CAROTID;  Surgeon: Jama Cordella MATSU, MD;  Location: [facility] ORS;  Service: Vascular;  Laterality: Left;   ENDARTERECTOMY Right 03/13/2018   Procedure: ENDARTERECTOMY CAROTID;  Surgeon: Jama Cordella MATSU, MD;  Location: [facility];  Service: Vascular;  Laterality: Right;   ESOPHAGOGASTRODUODENOSCOPY (EGD) WITH PROPOFOL  N/A 11/28/2020   Procedure: ESOPHAGOGASTRODUODENOSCOPY (EGD) WITH PROPOFOL ;  Surgeon: Jinny Carmine, MD;  Location: [facility] END[facility];  Service: Endoscopy;  Laterality: N/A;   EYE SURGERY     RENAL ANGIOGRAPHY N/A 03/05/2022   Procedure: RENAL ANGIOGRAPHY;  Surgeon: Jama Cordella MATSU, MD;  Location: [facility];  Service:  Cardiovascular;  Laterality: N/A;   THROAT SURGERY     x 10 yrs ago. throat mass.   TONSILLECTOMY       Social History   reports that she has never smoked. She has never been exposed to tobacco smoke. She has never used smokeless tobacco. She reports that she does not drink alcohol and does not use drugs.   Family History   Her family history includes Anuerysm in her mother; Diabetes in her brother; Esophageal cancer in her sister; Hearing loss in her brother; Lung cancer in her sister; Prostate cancer in her father. There is no history of Bladder Cancer, Kidney cancer, or Breast cancer.   Allergies Allergies  Allergen Reactions   Codeine Hives   Hydrocodone  Nausea  Only   Metronidazole Nausea Only   Propoxyphene Nausea Only   Sulfa  Antibiotics Hives   Amoxicillin-Pot Clavulanate Diarrhea   Latex Rash     Home Medications  Prior to Admission medications   Medication Sig Start Date End Date Taking? Authorizing Provider  acetaminophen  (TYLENOL ) 500 MG tablet Take 500 mg by mouth daily as needed for headache.    [provider]  ALPRAZolam  (XANAX ) 0.25 MG tablet Take 0.25 mg by mouth at bedtime as needed for sleep.    [provider]  ascorbic acid  (VITAMIN C) 1000 MG tablet Take 1,000 mg by mouth daily.    [provider]  aspirin  81 MG tablet Take 81 mg by mouth daily.    [provider]  atorvastatin  (LIPITOR) 10 MG tablet Take 1 tablet (10 mg total) by mouth daily. 03/05/22 03/05/23  Schnier, Cordella MATSU, MD  Cholecalciferol (VITAMIN D3) 1.25 MG (50000 UT) CAPS Take 1 capsule by mouth daily.    [provider]  clobetasol  cream (TEMOVATE ) 0.05 % Apply 1 Application topically 2 (two) times daily. Apply to affected area 07/08/23   Anyanwu, Gloris LABOR, MD  cloNIDine  (CATAPRES ) 0.1 MG tablet Take by mouth. 04/03/23 04/02/24  [provider]  clopidogrel  (PLAVIX ) 75 MG tablet TAKE 1 TABLET BY MOUTH EVERY DAY 01/22/23   Schnier, Gregory G, MD  conjugated estrogens  (PREMARIN ) vaginal cream Apply one pea-sized amount around the opening of the urethra three times weekly. 10/19/20   Vaillancourt, Samantha, PA-C  cyanocobalamin (VITAMIN B12) 1000 MCG/ML injection Inject into the muscle. 05/19/18   [provider]  felodipine  (PLENDIL ) 5 MG 24 hr tablet Take 5 mg by mouth every morning. 12/06/22   [provider]  fexofenadine (ALLEGRA) 180 MG tablet Take 180 mg by mouth daily as needed for allergies.     [provider]  fluticasone  (FLONASE ) 50 MCG/ACT nasal spray Place into both nostrils. 05/31/22   [provider]  gabapentin  (NEURONTIN ) 100 MG capsule Take 100 mg by mouth 3 (three)  times daily. 08/08/21   [provider]  hydrALAZINE  (APRESOLINE ) 25 MG tablet Take 25 mg by mouth 3 (three) times daily. 02/05/22   [provider]  hydrochlorothiazide  (HYDRODIURIL ) 12.5 MG tablet Take 1 tablet by mouth daily. 06/15/20   [provider]  isosorbide  mononitrate (IMDUR ) 60 MG 24 hr tablet Take 60 mg by mouth daily.    [provider]  lidocaine  (LIDODERM ) 5 % Place onto the skin. 04/10/23   [provider]  losartan  (COZAAR ) 100 MG tablet Take 100 mg by mouth daily.    [provider]  metoprolol  tartrate (LOPRESSOR ) 100 MG tablet Take 100 mg by mouth 2 (two) times daily. 09/27/20   [provider]  metroNIDAZOLE (METROGEL) 0.75 % vaginal gel Place vaginally at bedtime. 10/16/21   [provider]  mirabegron  ER (MYRBETRIQ ) 50 MG TB24 tablet Take 1 tablet (50 mg total) by mouth daily. 05/19/23   MacDiarmid, Glendia, MD  nitrofurantoin , macrocrystal-monohydrate, (MACROBID ) 100 MG capsule Take 1 capsule (100 mg total) by mouth daily. 05/19/23   Gaston Glendia, MD  ondansetron  (ZOFRAN ) 4 MG tablet Take 1 tablet (4 mg total) by mouth daily as needed for nausea or vomiting. 02/19/23 02/19/24  Cyrena Mylar, MD  pantoprazole  (PROTONIX ) 40 MG tablet Take 1 tablet (40 mg total) by mouth 2 (two) times daily before a meal. 11/28/20 02/20/23  Maree Hue, MD  predniSONE  (DELTASONE ) 2.5 MG tablet Take 2.5 mg by mouth daily. 09/26/21   [provider]  vitamin E 180 MG (400 UNITS) capsule Take 400 Units by mouth daily.    [provider]  Scheduled Meds:  enoxaparin  (LOVENOX ) injection  40 mg Subcutaneous Q24H   Continuous Infusions:  sodium chloride  40 mL/hr at 08/13/23 0308   [START ON 08/14/2023] azithromycin      cefTRIAXone  (ROCEPHIN )  IV     lactated ringers      PRN Meds:.acetaminophen  **OR** acetaminophen , ondansetron  **OR** ondansetron  (ZOFRAN ) IV   Active Hospital Problem list   See systems  below  Assessment & Plan:  #Sepsis without shock secondary to Influenza A Bronchopneumonia-present on admission Initial interventions/workup included: 2 L of NS/LR & Cefepime / Vancomycin / Azithromycin  meets SIRS criteria: Heart Rate 75 beats/minute, Respiratory Rate 24 breaths/minute,Temperature 104. -Supplemental oxygen as needed, to maintain SpO2 > 90% -F/u cultures, trend lactic/ PCT -Obtain MRSA PCR Urine streptococcal & legionella antigens. -Monitor WBC/ fever curve -IV antibiotics: Ceftriaxone  & Azithromycin  -IVF hydration as needed -Strict I/O's  #Elevated Troponin Likely demand in the setting of sepsis #Hypertension EKG with no dynamic changes -trend troponin until peaked -Continuous cardiac monitoring -Maintain MAP greater than 65 -Obtain 2D Echocardiogram if appropriate   #Acute Metabolic Encephalopathy due to Sepsis -Provide supportive care -Treat underlying cause as above -Avoid sedatives as able   #CKD Stage III #Hypokalemia -Monitor I&O's / urinary output -Follow BMP -Ensure adequate renal perfusion -Avoid nephrotoxic agents as able -Replace electrolytes as indicated   #T2 Diabetes mellitus -Check hemoglobin A1c -CBGs -Sliding scale insulin     Best practice:  Diet:  Oral DVT prophylaxis: LMWH GI prophylaxis: PPI Glucose control:  SSI Yes Mobility:  bed rest  PT consulted: N/A Last date of multidisciplinary goals of care discussion [discussed over the phone with patient's son at the time of admission] Code Status:  full code Disposition: Plan to discharge back home with son, who is the primary caregiver in about 1-2 days   = Goals of Care = Code Status Order: FULL  Primary Emergency Contact: Golab,Timothy, Home Phone: [phone] Discussed goals of care with patient's son (POA) who wishes to pursue full aggressive treatment and intervention options, including CPR and intubation, but goals of care will be addressed on going with family if that  should become necessary.   Critical care time: 45 minutes        Almarie Nose DNP, CCRN, FNP-C, AGACNP-BC Acute Care & Family Nurse Practitioner [facility] PCCM on call pager [phone]

## 2023-08-14 LAB — IRON AND TIBC
Iron: 21 ug/dL — ABNORMAL LOW (ref 28–170)
Saturation Ratios: 7 % — ABNORMAL LOW (ref 10.4–31.8)
TIBC: 315 ug/dL (ref 250–450)
UIBC: 294 ug/dL

## 2023-08-14 LAB — BASIC METABOLIC PANEL
Anion gap: 9 (ref 5–15)
BUN: 8 mg/dL (ref 8–23)
CO2: 23 mmol/L (ref 22–32)
Calcium: 8.8 mg/dL — ABNORMAL LOW (ref 8.9–10.3)
Chloride: 101 mmol/L (ref 98–111)
Creatinine, Ser: 0.86 mg/dL (ref 0.44–1.00)
GFR, Estimated: >60 mL/min (ref >60)
Glucose, Bld: 101 mg/dL — ABNORMAL HIGH (ref 70–99)
Potassium: 3.4 mmol/L — ABNORMAL LOW (ref 3.5–5.1)
Sodium: 136 mmol/L (ref 135–145)

## 2023-08-14 LAB — CBC WITH DIFFERENTIAL/PLATELET
Abs Immature Granulocytes: 0.01 10*3/uL (ref 0.00–0.07)
Basophils Absolute: 0 10*3/uL (ref 0.0–0.1)
Basophils Relative: 0 %
Eosinophils Absolute: 0 10*3/uL (ref 0.0–0.5)
Eosinophils Relative: 0 %
HCT: 34.8 % — ABNORMAL LOW (ref 36.0–46.0)
Hemoglobin: 11.3 g/dL — ABNORMAL LOW (ref 12.0–15.0)
Immature Granulocytes: 0 %
Lymphocytes Relative: 21 %
Lymphs Abs: 1.1 10*3/uL (ref 0.7–4.0)
MCH: 30.3 pg (ref 26.0–34.0)
MCHC: 32.5 g/dL (ref 30.0–36.0)
MCV: 93.3 fL (ref 80.0–100.0)
Monocytes Absolute: 0.6 10*3/uL (ref 0.1–1.0)
Monocytes Relative: 12 %
Neutro Abs: 3.3 10*3/uL (ref 1.7–7.7)
Neutrophils Relative %: 67 %
Platelets: 131 10*3/uL — ABNORMAL LOW (ref 150–400)
RBC: 3.73 MIL/uL — ABNORMAL LOW (ref 3.87–5.11)
RDW: 12.8 % (ref 11.5–15.5)
WBC: 5.1 10*3/uL (ref 4.0–10.5)
nRBC: 0 % (ref 0.0–0.2)

## 2023-08-14 LAB — FERRITIN: Ferritin: 69 ng/mL (ref 11–307)

## 2023-08-14 LAB — URINE CULTURE: Culture: NO GROWTH

## 2023-08-14 LAB — VITAMIN B12: Vitamin B-12: 582 pg/mL (ref 180–914)

## 2023-08-14 MED ORDER — IRON SUCROSE 200 MG IVPB - SIMPLE MED
200.0000 mg | Freq: Once | Status: AC
  Administered 2023-08-15: 200 mg via INTRAVENOUS
  Filled 2023-08-14: qty 200

## 2023-08-14 MED ORDER — AZITHROMYCIN 250 MG PO TABS
500.0000 mg | ORAL_TABLET | Freq: Every day | ORAL | Status: DC
  Administered 2023-08-14: 500 mg via ORAL
  Filled 2023-08-14: qty 2

## 2023-08-14 MED ORDER — AZITHROMYCIN 250 MG PO TABS: 500.0000 mg | ORAL_TABLET | Freq: Every day | ORAL | Status: DC

## 2023-08-14 MED ORDER — PHENOL 1.4 % MT LIQD
1.0000 | OROMUCOSAL | Status: DC | PRN
  Administered 2023-08-14: 1 via OROMUCOSAL
  Filled 2023-08-14 (×2): qty 177

## 2023-08-14 MED ORDER — POTASSIUM CHLORIDE 20 MEQ PO PACK
20.0000 meq | PACK | Freq: Once | ORAL | Status: AC
  Administered 2023-08-14: 20 meq via ORAL
  Filled 2023-08-14: qty 1

## 2023-08-14 NOTE — Evaluation (Signed)
 Occupational Therapy Evaluation Patient Details Name: Dawn Mcintyre MRN: [id number] DOB: 01/24/1933 Today's Date: 08/14/2023   History of Present Illness Pt admitted for sepsis secondary to pneumonia with positive flu. History includes HTN, DM, CKD, GERD, and urinary incontience.   Clinical Impression   Pt was seen for OT evaluation this date. Prior to hospital admission, pt was living alone and IND. Reports her son lives in [place] and comes in every 2 weeks to stay with her. Pt STR was mentioned, pt stated she was planning to have a lady come in to assist her. MD present and stated he would find out from her son. Denies recent falls.   Pt presents to acute OT demonstrating impaired ADL performance and functional mobility 2/2 weakness and low activity tolerance (See OT problem list for additional functional deficits). Pt currently requires Min to CGA for bed mobility. She was oriented, but initially lethargic requiring increased time to become more alert/awake.  Noted to be soiled of urine and needed linen change and underwear removed. Pt needed Mod A to doff panties in sitting/standing at EOB. Min A for STS from EOB via HHA d/t now RW present in room-brought up by rehab tech at end of session. Max A to don socks seated EOB d/t lethargy and imbalance while seated. Min A via HHA and pt holding bed rail and chair arm for step pivot to recliner. Breakfast tray set up with food cut and all containers opened. Sp02 dropped to 90% on 1 L with activity, noted to be 94% prior to movement. Pt admitting she is very weak at this time. Pt would benefit from skilled OT services to address noted impairments and functional limitations (see below for any additional details) in order to maximize safety and independence while minimizing falls risk and caregiver burden. Do anticipate the need for follow up OT services upon acute hospital DC.        If plan is discharge home, recommend the following: A lot of help with  walking and/or transfers;A lot of help with bathing/dressing/bathroom;Assistance with cooking/housework;Assist for transportation;Help with stairs or ramp for entrance    Functional Status Assessment  Patient has had a recent decline in their functional status and demonstrates the ability to make significant improvements in function in a reasonable and predictable amount of time.  Equipment Recommendations  BSC/3in1 (RW)    Recommendations for Other Services       Precautions / Restrictions Precautions Precautions: Fall Restrictions Weight Bearing Restrictions Per Provider Order: No      Mobility Bed Mobility Overal bed mobility: Needs Assistance Bed Mobility: Supine to Sit     Supine to sit: Contact guard, Used rails, Min assist     General bed mobility comments: pt able to reach sitting EOB with CGA, however leaned to R side d/t fatigue/lethargic needed Min A to get back to upright position    Transfers Overall transfer level: Needs assistance Equipment used: 1 person hand held assist Transfers: Sit to/from Stand, Bed to chair/wheelchair/BSC Sit to Stand: Min assist     Step pivot transfers: Min assist     General transfer comment: RW not available, used HHA x1 and pt held to bed rail with other hand needed Min A to steady during transfer; RW brought up for next session      Balance Overall balance assessment: Needs assistance Sitting-balance support: Feet supported, Single extremity supported Sitting balance-Leahy Scale: Fair Sitting balance - Comments: pt with R lateral lean with inital sitting d/t  fatigue/lethargic intially Postural control: Right lateral lean Standing balance support: Single extremity supported, Bilateral upper extremity supported Standing balance-Leahy Scale: Poor Standing balance comment: needs assist to maintain balance for transfer                           ADL either performed or assessed with clinical judgement   ADL  Overall ADL's : Needs assistance/impaired Eating/Feeding: Set up;Sitting                   Lower Body Dressing: Sit to/from stand;Sitting/lateral leans;Moderate assistance;Maximal assistance Lower Body Dressing Details (indicate cue type and reason): Max to don socks seated EOB; Mod A to manage mesh panties Toilet Transfer: Moderate assistance;Minimal assistance Toilet Transfer Details (indicate cue type and reason): simulated to recliner via HHA; walker brought in afterwards for next session                 Vision         Perception         Praxis         Pertinent Vitals/Pain Pain Assessment Pain Assessment: No/denies pain     Extremity/Trunk Assessment Upper Extremity Assessment Upper Extremity Assessment: Generalized weakness   Lower Extremity Assessment Lower Extremity Assessment: Generalized weakness       Communication Communication Communication: No apparent difficulties Cueing Techniques: Verbal cues   Cognition Arousal: Alert, Lethargic Behavior During Therapy: WFL for tasks assessed/performed Overall Cognitive Status: Within Functional Limits for tasks assessed                                 General Comments: pleasant and agreeable to session although initally lethargic, she awaked as session progressed     General Comments  soiled in urine requiring changing; sp02 90% lowest on 1L    Exercises Other Exercises Other Exercises: Edu on role of OT in acute setting and importance of therapy to maximize safety/strength/IND.   Shoulder Instructions      Home Living Family/patient expects to be discharged to:: Private residence Living Arrangements: Alone   Type of Home: House Home Access: Level entry     Home Layout: One level               Home Equipment: None          Prior Functioning/Environment Prior Level of Function : Independent/Modified Independent;Driving             Mobility Comments: pt  reports zero falls history ADLs Comments: indep; lives alone and son comes in from [place] every 2 weeks; pt reports she is going to have a lady to come stay with her upon DC?? MD to follow up with son on this        OT Problem List: Decreased strength;Decreased activity tolerance;Impaired balance (sitting and/or standing)      OT Treatment/Interventions: Self-care/ADL training;Therapeutic exercise;Patient/family education;Balance training;Energy conservation;Therapeutic activities;DME and/or AE instruction    OT Goals(Current goals can be found in the care plan section) Acute Rehab OT Goals Patient Stated Goal: improve endurance and strength OT Goal Formulation: With patient Time For Goal Achievement: 08/28/23 Potential to Achieve Goals: Good ADL Goals Pt Will Perform Lower Body Bathing: sitting/lateral leans;sit to/from stand;with contact guard assist Pt Will Perform Lower Body Dressing: with contact guard assist;sit to/from stand;sitting/lateral leans Pt Will Transfer to Toilet: with contact guard assist;regular height toilet;ambulating Additional ADL Goal #  1: Pt will demo/verbalize use of energy conservation strategies, PLB, and other compensatory strategies to maximize safety/IND and prevent overexertion.  OT Frequency: Min 1X/week    Co-evaluation              AM-PAC OT 6 Clicks Daily Activity     Outcome Measure Help from another person eating meals?: None Help from another person taking care of personal grooming?: None Help from another person toileting, which includes using toliet, bedpan, or urinal?: A Lot Help from another person bathing (including washing, rinsing, drying)?: A Lot Help from another person to put on and taking off regular upper body clothing?: A Little Help from another person to put on and taking off regular lower body clothing?: A Lot 6 Click Score: 17   End of Session Equipment Utilized During Treatment: Oxygen Nurse Communication: Mobility  status  Activity Tolerance: Patient tolerated treatment well Patient left: in chair;with call bell/phone within reach;with chair alarm set  OT Visit Diagnosis: Other abnormalities of gait and mobility (R26.89);Unsteadiness on feet (R26.81);Muscle weakness (generalized) (M62.81)                Time: 9086-9055 OT Time Calculation (min): 31 min Charges:  OT General Charges $OT Visit: 1 Visit OT Evaluation $OT Eval Moderate Complexity: 1 Mod OT Treatments $Self Care/Home Management : 8-22 mins [place] Deleo, OTR/L 08/14/23, 10:10 AM  Shalon Salado E Nevyn Bossman 08/14/2023, 10:04 AM

## 2023-08-14 NOTE — Progress Notes (Addendum)
 Progress Note   Patient: Dawn Mcintyre FMW:[id number] DOB: 11/13/1932 DOA: 08/12/2023     1 DOS: the patient was seen and examined on 08/14/2023   Brief hospital course: 89 y.o  female with significant PMH of HTN, diet-controlled T2DM, CKD stage III, HDL, GERD, Barrett's esophagus, B12 deficiency, anemia, temporal arteritis, peripheral neuropathy, urinary incontinence and osteopenia who presented to the ED with chief complaints of altered mental status.   Patient's son requested a neighbor to check on the patient, and on their arrival she was found to be confused, lethargic and not acting herself.  Family report she was being treated for urinary tract infection with amoxicillin although per care everywhere she was sent a prescription of cefuroxime 1 tablet (250 mg) twice daily x 5 days and ciprofloxacin  250 mg 1 tablet twice daily x 7 days on 08/05/2023.  Per EMS report it appears that she had not completed her medications as prescribed.   ED Course: Initial vital signs showed HR of 76 beats/minute, BP 163/57 mm Hg, the RR 21 breaths/minute, and the oxygen saturation 91% on RA and a temperature of !)104 F (40 C)   Patient was found to be flu A positive and she was noted to have possible pneumonia. She was started on cefepime  and vancomycin  due to her confusion, fever, and unclear infectious source. She was eventually transitioned to azithromycin  and ceftriaxone  for possible CAP. Her flu A came back positive and she was started on tamiflu    Assessment and Plan: Influenza A infection  Possible CAP  Oxygen requirement  Sepsis resolved  Patient's oxygen requirement likely due to her being flu A positive. Oxygen req is improving. Low suspicion for bacterial pneumonia though will continue antibiotics for now  Continue ceftriaxone  for three days and azithromycin  for 2 more days  Continue tamiflu   Acapella/IS Mucinex  DM   Nonischemic myocardial injury in the setting of sepsis  Nothing to do    Normocytic anemia  Mild thrombocytopenia  Stable.  Iron /TIBC/Ferritin/ %Sat    Component Value Date/Time   IRON  21 (L) 08/14/2023 0542   TIBC 315 08/14/2023 0542   FERRITIN 69 08/14/2023 0542   IRONPCTSAT 7 (L) 08/14/2023 0542   Transfuse <7hgb  -Give IV iron    Acute metabolic encephalopathy resolved  Hypertension Appears to be on losartan  100mg  daily, metoprolol  tartrate 100mg  BID, HCTZ12.5mg  daily, and PRN clonidine   -Continue reduced dose losartan  50mg  and continue metoprolol  at 50mg  BID   CKD 3a Appears at baseline.     Latest Ref Rng & Units 08/14/2023    5:42 AM 08/13/2023   10:34 AM 08/13/2023    4:14 AM  BMP  Glucose 70 - 99 mg/dL 898  890  889   BUN 8 - 23 mg/dL 8  10  10    Creatinine 0.44 - 1.00 mg/dL 9.13  9.04  9.23   Sodium 135 - 145 mmol/L 136  141  139   Potassium 3.5 - 5.1 mmol/L 3.4  3.6  3.1   Chloride 98 - 111 mmol/L 101  109  112   CO2 22 - 32 mmol/L 23  23  22    Calcium  8.9 - 10.3 mg/dL 8.8  8.5  7.7   Continue to monitor      Subjective: Patient's breathing continues to improve. She is drowsy on exam, states she did take an as needed alprazolam . She continues to have multiple stools but this is also improving.   Physical Exam: Vitals:   08/13/23 2225 08/14/23 0445  08/14/23 0903 08/14/23 1622  BP:  (!) 158/60 (!) 173/60 (!) 129/53  Pulse:  63 70 (!) 59  Resp:  18 20 16   Temp:  98.1 F (36.7 C) 98.2 F (36.8 C) (!) 97.5 F (36.4 C)  TempSrc:  Oral Oral Oral  SpO2: 94% 95% 93% 93%  Weight:      Height:       Physical Exam  Constitutional: In no distress. Thin  Cardiovascular: Normal rate, regular rhythm. No lower extremity edema  Pulmonary: Non labored breathing on [place], no wheezing or rales.  Abdominal: Soft. Normal bowel sounds. Non distended and non tender Musculoskeletal: Normal range of motion.     Neurological: Alert and oriented to person, place, and time. Non focal  Skin: Skin is warm and dry.    Data Reviewed:  I have  reviewed all images and labs.      Latest Ref Rng & Units 08/14/2023    5:42 AM 08/13/2023    4:14 AM 08/12/2023   11:46 PM  CBC  WBC 4.0 - 10.5 K/uL 5.1  4.7  5.9   Hemoglobin 12.0 - 15.0 g/dL 88.6  9.0  89.0   Hematocrit 36.0 - 46.0 % 34.8  28.3  33.8   Platelets 150 - 400 K/uL 131  140  173       Latest Ref Rng & Units 08/14/2023    5:42 AM 08/13/2023   10:34 AM 08/13/2023    4:14 AM  BMP  Glucose 70 - 99 mg/dL 898  890  889   BUN 8 - 23 mg/dL 8  10  10    Creatinine 0.44 - 1.00 mg/dL 9.13  9.04  9.23   Sodium 135 - 145 mmol/L 136  141  139   Potassium 3.5 - 5.1 mmol/L 3.4  3.6  3.1   Chloride 98 - 111 mmol/L 101  109  112   CO2 22 - 32 mmol/L 23  23  22    Calcium  8.9 - 10.3 mg/dL 8.8  8.5  7.7      Family Communication: spoke with son.   Disposition: Status is: Inpatient Remains inpatient appropriate because: Oxygen requirement, pneumonia   Planned Discharge Destination:  Pending clinical course   PT/OT  Time spent: 35 minutes  Author: Alban Pepper, MD 08/14/2023 5:21 PM  For on call review [email or web address].

## 2023-08-14 NOTE — Progress Notes (Signed)
 Physical Therapy Treatment Patient Details Name: Dawn Mcintyre MRN: [id number] DOB: [birth year] Today's Date: 08/14/2023   History of Present Illness Dawn Mcintyre is an 89yoF who comes to [facility] on 08/12/23 with AMS and weakness, pt recently treated for UTI 12/31. Pt admitted for sepsis secondary to pneumonia with positive flu. History includes HTN, DM, CKD, GERD, and urinary incontience.    PT Comments  Pt in recliner on entry, home aid at bedside; pt awake and pleasant, but reports to feel very drowsy, says she received a sleep aid last night. Pt struggles to achieve standing balance, heavy posterior lean while up the first time; this improved with repeat efforts, but remains limited and unsafe. Pt reports lethargy and desire to rest. Will continue to follow.     If plan is discharge home, recommend the following: A lot of help with walking and/or transfers;A lot of help with bathing/dressing/bathroom;Help with stairs or ramp for entrance   Can travel by private vehicle     No  Equipment Recommendations  Rolling walker (2 wheels)    Recommendations for Other Services       Precautions / Restrictions Precautions Precautions: Fall     Mobility  Bed Mobility               General bed mobility comments: in recliner    Transfers Overall transfer level: Needs assistance Equipment used: Rolling walker (2 wheels) Transfers: Sit to/from Stand Sit to Stand: Min assist           General transfer comment: blocked practice on this today    Ambulation/Gait                   Stairs             Wheelchair Mobility     Tilt Bed    Modified Rankin (Stroke Patients Only)       Balance                                            Cognition                                                Exercises Other Exercises Other Exercises: STS from recliner: 1x without device, then 2x with RW; progressively improved ability  to achieve balance in stance but mostly struggling to DC posterior lean.    General Comments        Pertinent Vitals/Pain Pain Assessment Pain Assessment: No/denies pain    Home Living                          Prior Function            PT Goals (current goals can now be found in the care plan section) Acute Rehab PT Goals Patient Stated Goal: to go home PT Goal Formulation: With patient Time For Goal Achievement: 08/27/23 Potential to Achieve Goals: Good Progress towards PT goals: Not progressing toward goals - comment    Frequency    Min 1X/week      PT Plan      Co-evaluation              AM-PAC PT 6 Clicks  Mobility   Outcome Measure  Help needed turning from your back to your side while in a flat bed without using bedrails?: A Little Help needed moving from lying on your back to sitting on the side of a flat bed without using bedrails?: A Little Help needed moving to and from a bed to a chair (including a wheelchair)?: A Lot Help needed standing up from a chair using your arms (e.g., wheelchair or bedside chair)?: A Lot Help needed to walk in hospital room?: A Lot Help needed climbing 3-5 steps with a railing? : Total 6 Click Score: 13    End of Session Equipment Utilized During Treatment: Oxygen Activity Tolerance: Patient limited by lethargy Patient left: in chair;with chair alarm set;with family/visitor present Nurse Communication: Mobility status PT Visit Diagnosis: Muscle weakness (generalized) (M62.81);Difficulty in walking, not elsewhere classified (R26.2)     Time: 8940-8884 PT Time Calculation (min) (ACUTE ONLY): 16 min  Charges:    $Therapeutic Activity: 8-22 mins PT General Charges $$ ACUTE PT VISIT: 1 Visit                    4:35 PM, 08/14/23 Peggye JAYSON Linear, PT, DPT Physical Therapist - [facility]  [phone] (ASCOM)     Hailee Hollick C 08/14/2023, 4:33 PM

## 2023-08-15 DIAGNOSIS — J189 Pneumonia, unspecified organism: Secondary | ICD-10-CM | POA: Diagnosis not present

## 2023-08-15 DIAGNOSIS — A419 Sepsis, unspecified organism: Secondary | ICD-10-CM | POA: Diagnosis not present

## 2023-08-15 LAB — LEGIONELLA PNEUMOPHILA SEROGP 1 UR AG: L. pneumophila Serogp 1 Ur Ag: NEGATIVE

## 2023-08-15 MED ORDER — MUPIROCIN 2 % EX OINT
1.0000 | TOPICAL_OINTMENT | Freq: Two times a day (BID) | CUTANEOUS | Status: DC
  Administered 2023-08-15 – 2023-08-16 (×2): 1 via NASAL
  Filled 2023-08-15: qty 22

## 2023-08-15 MED ORDER — CHLORHEXIDINE GLUCONATE CLOTH 2 % EX PADS
6.0000 | MEDICATED_PAD | Freq: Every day | CUTANEOUS | Status: DC
  Administered 2023-08-16: 6 via TOPICAL

## 2023-08-15 NOTE — Progress Notes (Signed)
 PROGRESS NOTE    Dawn Mcintyre  FMW:[id number] DOB: 01-31-1933 DOA: 08/12/2023 PCP: Auston Reyes BIRCH, MD  Chief Complaint  Patient presents with   Altered Mental Status    Hospital Course:  Dawn Mcintyre is 89 y.o. female with hypertension, type 2 diabetes, CKD stage III, hyperlipidemia, GERD, Barrett's esophagus, B12 deficiency, anemia, temporal arteritis, peripheral neuropathy, urinary incontinence, osteopenia, who was brought into the ED for altered mental status.  Outpatient patient has been receiving treatment for UTI with Ceftin.  On arrival to the ED she was febrile, tachypneic, and had O2 sat of 91% on room air.  She is on be flu a positive.  She was initially started on cefepime  and vancomycin  but transition to azithromycin  and ceftriaxone  for CAP coverage.  Subjective: No acute events overnight. On evaluation today patient reports feeling much better. Still with occasional cough. She is nervous about returning home in winter storm given her home may not have power.  Objective: Vitals:   08/14/23 2026 08/15/23 0400 08/15/23 0452 08/15/23 0756  BP: (!) 145/59 (!) 148/49 (!) 147/55 (!) 127/50  Pulse: (!) 57 (!) 52 61 (!) 57  Resp: 16 16 14 18   Temp: 97.8 F (36.6 C) 97.9 F (36.6 C) 98.2 F (36.8 C) 98.1 F (36.7 C)  TempSrc: Oral  Oral   SpO2: 96% 96% 94% 92%  Weight:      Height:        Intake/Output Summary (Last 24 hours) at 08/15/2023 1603 Last data filed at 08/15/2023 0900 Gross per 24 hour  Intake 320 ml  Output --  Net 320 ml   Filed Weights   08/13/23 1225  Weight: 64 kg    Examination: General exam: Appears calm and comfortable, NAD  Respiratory system: No work of breathing, symmetric chest wall expansion, +cough Cardiovascular system: S1 & S2 heard, RRR.  Gastrointestinal system: Abdomen is nondistended, soft and nontender.  Neuro: Alert and oriented. No focal neurological deficits. Extremities: Symmetric, expected ROM Skin: No rashes,  lesions Psychiatry: Demonstrates appropriate judgement and insight. Mood & affect appropriate for situation.   Assessment & Plan:  Principal Problem:   Sepsis due to pneumonia [facility]) Active Problems:   Influenza and pneumonia    Influenza A - Initially concern of pneumonia, procalcitonin unremarkable.  Suspect all symptoms are secondary to flu - Will discontinue CAP coverage - Continue with Tamiflu  for now - Continue symptomatic support Mucinex  DM  Acute metabolic encephalopathy - Resolved now.  At her baseline.  Caregiver at bedside.  Acute hypoxic respiratory failure - Initially required supplemental O2, intermittently 1L requirement now  Elevated troponin - In setting of sepsis secondary to demand ischemia.  No chest pain.  Troponin downtrending  B12 deficiency Anemia - Continue home meds  CKD stage III - At baseline, continue monitoring - Avoid nephrotoxic meds - Renally dose when needed  Type 2 diabetes - Continue sliding scale insulin , titrate up as tolerated  HTN - Resume home meds  DVT prophylaxis: lovenox    Code Status: Full Code Family Communication: Communicated with caregiver at bedside and son [name] on the phone. Disposition:  Status is: Inpatient, likely DC tomorrow    Consultants:      Procedures:    Antimicrobials:  Anti-infectives (From admission, onward)    Start     Dose/Rate Route Frequency Ordered Stop   08/14/23 2200  azithromycin  (ZITHROMAX ) tablet 500 mg  Status:  Discontinued        500 mg Oral Daily 08/14/23  1726 08/14/23 1742   08/14/23 2200  azithromycin  (ZITHROMAX ) tablet 500 mg        500 mg Oral Daily 08/14/23 1742 08/16/23 2159   08/14/23 1200  vancomycin  (VANCOREADY) IVPB 750 mg/150 mL  Status:  Discontinued        750 mg 150 mL/hr over 60 Minutes Intravenous Every 24 hours 08/13/23 1256 08/13/23 1321   08/14/23 0000  azithromycin  (ZITHROMAX ) 500 mg in sodium chloride  0.9 % 250 mL IVPB  Status:  Discontinued        500  mg 250 mL/hr over 60 Minutes Intravenous Every 24 hours 08/13/23 0233 08/14/23 1726   08/13/23 1415  oseltamivir  (TAMIFLU ) capsule 75 mg  Status:  Discontinued        75 mg Oral 2 times daily 08/13/23 1401 08/13/23 1405   08/13/23 1415  oseltamivir  (TAMIFLU ) capsule 30 mg        30 mg Oral 2 times daily 08/13/23 1405 08/18/23 0959   08/13/23 1330  vancomycin  (VANCOREADY) IVPB 500 mg/100 mL  Status:  Discontinued        500 mg 100 mL/hr over 60 Minutes Intravenous  Once 08/13/23 1256 08/13/23 1321   08/13/23 1300  vancomycin  (VANCOREADY) IVPB 1500 mg/300 mL  Status:  Discontinued        1,500 mg 150 mL/hr over 120 Minutes Intravenous  Once 08/13/23 1229 08/13/23 1254   08/13/23 1200  cefTRIAXone  (ROCEPHIN ) 2 g in sodium chloride  0.9 % 100 mL IVPB        2 g 200 mL/hr over 30 Minutes Intravenous Every 24 hours 08/13/23 0233 08/18/23 1159   08/13/23 0215  azithromycin  (ZITHROMAX ) 500 mg in sodium chloride  0.9 % 250 mL IVPB        500 mg 250 mL/hr over 60 Minutes Intravenous  Once 08/13/23 0200 08/13/23 0345   08/13/23 0000  ceFEPIme  (MAXIPIME ) 2 g in sodium chloride  0.9 % 100 mL IVPB        2 g 200 mL/hr over 30 Minutes Intravenous  Once 08/12/23 2358 08/13/23 0158   08/13/23 0000  vancomycin  (VANCOCIN ) IVPB 1000 mg/200 mL premix        1,000 mg 200 mL/hr over 60 Minutes Intravenous  Once 08/12/23 2358 08/13/23 0158       Data Reviewed: I have personally reviewed following labs and imaging studies CBC: Recent Labs  Lab 08/12/23 2346 08/13/23 0414 08/14/23 0542  WBC 5.9 4.7 5.1  NEUTROABS 4.5  --  3.3  HGB 10.9* 9.0* 11.3*  HCT 33.8* 28.3* 34.8*  MCV 94.4 95.9 93.3  PLT 173 140* 131*   Basic Metabolic Panel: Recent Labs  Lab 08/12/23 2346 08/13/23 0414 08/13/23 1034 08/14/23 0542  NA 137 139 141 136  K 3.1* 3.1* 3.6 3.4*  CL 103 112* 109 101  CO2 24 22 23 23   GLUCOSE 124* 110* 109* 101*  BUN 13 10 10 8   CREATININE 0.99 0.76 0.95 0.86  CALCIUM  9.6 7.7* 8.5* 8.8*    GFR: Estimated Creatinine Clearance: 42.3 mL/min (by C-G formula based on SCr of 0.86 mg/dL). Liver Function Tests: Recent Labs  Lab 08/12/23 2346  AST 20  ALT 12  ALKPHOS 56  BILITOT 0.5  PROT 6.6  ALBUMIN 3.8   CBG: No results for input(s): GLUCAP in the last 168 hours.  Recent Results (from the past 240 hours)  Urine Culture     Status: None   Collection Time: 08/12/23 11:46 PM   Specimen: Urine, Catheterized  Result Value Ref Range Status   Specimen Description   Final    URINE, CATHETERIZED Performed at [facility], [address], [place], [place] [address]    Special Requests   Final    NONE Performed at [facility], [address], [place], [place] [address]    Culture   Final    NO GROWTH Performed at [facility], [address], [place], [place] [address]    Report Status 08/14/2023 FINAL  Final  Resp panel by RT-PCR (RSV, Flu A&B, Covid) Anterior Nasal Swab     Status: Abnormal   Collection Time: 08/13/23 12:22 AM   Specimen: Anterior Nasal Swab  Result Value Ref Range Status   SARS Coronavirus 2 by RT PCR NEGATIVE NEGATIVE Final    Comment: (NOTE) SARS-CoV-2 target nucleic acids are NOT DETECTED.  The SARS-CoV-2 RNA is generally detectable in upper respiratory specimens during the acute phase of infection. The lowest concentration of SARS-CoV-2 viral copies this assay can detect is 138 copies/mL. A negative result does not preclude SARS-Cov-2 infection and should not be used as the sole basis for treatment or other patient management decisions. A negative result may occur with  improper specimen collection/handling, submission of specimen other than nasopharyngeal swab, presence of viral mutation(s) within the areas targeted by this assay, and inadequate number of viral copies(<138 copies/mL). A negative result must be combined with clinical observations, patient history, and epidemiological information. The expected  result is Negative.  Fact Sheet for Patients:  bloggercourse.com  Fact Sheet for Healthcare Providers:  seriousbroker.it  This test is no t yet approved or cleared by the [place]  FDA and  has been authorized for detection and/or diagnosis of SARS-CoV-2 by FDA under an Emergency Use Authorization (EUA). This EUA will remain  in effect (meaning this test can be used) for the duration of the COVID-19 declaration under Section 564(b)(1) of the Act, 21 U.S.C.section 360bbb-3(b)(1), unless the authorization is terminated  or revoked sooner.       Influenza A by PCR POSITIVE (A) NEGATIVE Final   Influenza B by PCR NEGATIVE NEGATIVE Final    Comment: (NOTE) The Xpert Xpress SARS-CoV-2/FLU/RSV plus assay is intended as an aid in the diagnosis of influenza from Nasopharyngeal swab specimens and should not be used as a sole basis for treatment. Nasal washings and aspirates are unacceptable for Xpert Xpress SARS-CoV-2/FLU/RSV testing.  Fact Sheet for Patients: bloggercourse.com  Fact Sheet for Healthcare Providers: seriousbroker.it  This test is not yet approved or cleared by the [place]  FDA and has been authorized for detection and/or diagnosis of SARS-CoV-2 by FDA under an Emergency Use Authorization (EUA). This EUA will remain in effect (meaning this test can be used) for the duration of the COVID-19 declaration under Section 564(b)(1) of the Act, 21 U.S.C. section 360bbb-3(b)(1), unless the authorization is terminated or revoked.     Resp Syncytial Virus by PCR NEGATIVE NEGATIVE Final    Comment: (NOTE) Fact Sheet for Patients: bloggercourse.com  Fact Sheet for Healthcare Providers: seriousbroker.it  This test is not yet approved or cleared by the [place]  FDA and has been authorized for detection and/or  diagnosis of SARS-CoV-2 by FDA under an Emergency Use Authorization (EUA). This EUA will remain in effect (meaning this test can be used) for the duration of the COVID-19 declaration under Section 564(b)(1) of the Act, 21 U.S.C. section 360bbb-3(b)(1), unless the authorization is terminated or revoked.  Performed at [facility], [phone]  [address] [place], [place] [address]   Blood Culture (routine x 2)     Status: None (Preliminary result)   Collection Time: 08/13/23 12:22 AM   Specimen: BLOOD  Result Value Ref Range Status   Specimen Description BLOOD BLOOD LEFT HAND  Final   Special Requests   Final    BOTTLES DRAWN AEROBIC AND ANAEROBIC Blood Culture adequate volume   Culture   Final    NO GROWTH 2 DAYS Performed at [facility], [address] [place], [place] [address]    Report Status PENDING  Incomplete  Blood Culture (routine x 2)     Status: None (Preliminary result)   Collection Time: 08/13/23 12:30 AM   Specimen: BLOOD  Result Value Ref Range Status   Specimen Description BLOOD BLOOD LEFT ARM  Final   Special Requests   Final    BOTTLES DRAWN AEROBIC AND ANAEROBIC Blood Culture results may not be optimal due to an excessive volume of blood received in culture bottles   Culture   Final    NO GROWTH 2 DAYS Performed at [facility], [address], [place], [place] [address]    Report Status PENDING  Incomplete  MRSA Next Gen by PCR, Nasal     Status: Abnormal   Collection Time: 08/13/23  3:35 AM   Specimen: Nasal Mucosa; Nasal Swab  Result Value Ref Range Status   MRSA by PCR Next Gen (A) NOT DETECTED Final    INVALID, UNABLE TO DETERMINE THE PRESENCE OF TARGET DUE TO SPECIMEN INTEGRITY. RECOLLECTION REQUESTED.    Comment: Results Called to:  WADDELL CRAZE AT 9472 08/13/23 JG Performed at [facility], [address] [place], [place] [address]   MRSA Next Gen by PCR, Nasal     Status: Abnormal   Collection Time: 08/13/23   5:47 AM  Result Value Ref Range Status   MRSA by PCR Next Gen DETECTED (A) NOT DETECTED Final    Comment: RESULT CALLED TO, READ BACK BY AND VERIFIED WITH: CARLYON POLLACK, RN [phone] 08/13/23 GM (NOTE) The GeneXpert MRSA Assay (FDA approved for NASAL specimens only), is one component of a comprehensive MRSA colonization surveillance program. It is not intended to diagnose MRSA infection nor to guide or monitor treatment for MRSA infections. Test performance is not FDA approved in patients less than 66 years old. Performed at [facility], [address] [place], [place] [address]   C Difficile Quick Screen w PCR reflex     Status: None   Collection Time: 08/13/23  1:32 PM   Specimen: STOOL  Result Value Ref Range Status   C Diff antigen NEGATIVE NEGATIVE Final   C Diff toxin NEGATIVE NEGATIVE Final   C Diff interpretation No C. difficile detected.  Final    Comment: Performed at [facility], [address] [place], [place] [address]     Radiology Studies: No results found.  Scheduled Meds:  azithromycin   500 mg Oral Daily   [START ON 08/16/2023] Chlorhexidine  Gluconate Cloth  6 each Topical Q0600   dextromethorphan -guaiFENesin   1 tablet Oral BID   enoxaparin  (LOVENOX ) injection  40 mg Subcutaneous Q24H   losartan   50 mg Oral Daily   metoprolol  tartrate  50 mg Oral BID   mupirocin  ointment  1 Application Nasal BID   oseltamivir   30 mg Oral BID   Continuous Infusions:  cefTRIAXone  (ROCEPHIN )  IV 2 g (08/15/23 1218)     LOS: 2 days  Time spent:  55min  Deaisha Welborn, DO [facility]  To contact the attending physician between 7A-7P please use Epic Chat. To contact the covering physician during after hours 7P-7A, please review Amion.   08/15/2023, 4:03 PM   *This document has been created with the assistance of dictation software. Please excuse typographical errors. *

## 2023-08-15 NOTE — TOC Initial Note (Signed)
 Transition of Care [facility]) - Initial/Assessment Note    Patient Details  Name: Dawn Mcintyre MRN: [id number] Date of Birth: April 06, 1933  Transition of Care [facility]) CM/SW Contact:    Ladene Lady, LCSW Phone Number: 08/15/2023, 3:16 PM  Clinical Narrative:  Pt admitted from home alone. Pt states she has hired help through [employer] or [employer] she is unsure. She said the aide was coming every day for 3 hours but has now increased it to 8 hours a day. Pt also has support through her son timothy who checks on her daily. She states she was independent prior to admission so she would like us  to order a RW. Pt states she would be agreeable to [facility] but does not have a preference on agency. Pt does not want to go to snf at this time.                Barriers to Discharge: Continued Medical Work up   Patient Goals and CMS Choice Patient states their goals for this hospitalization and ongoing recovery are:: return home with [facility] CMS Medicare.gov Compare Post Acute Care list provided to:: Patient        Expected Discharge Plan and Services                         DME Arranged: Walker rolling DME Agency: [employer] Date DME Agency Contacted: 08/15/23                Prior Living Arrangements/Services   Lives with:: Self Patient language and need for interpreter reviewed:: Yes Do you feel safe going back to the place where you live?: Yes      Need for Family Participation in Patient Care: Yes (Comment)   Current home services: DME Criminal Activity/Legal Involvement Pertinent to Current Situation/Hospitalization: Yes - Comment as needed  Activities of Daily Living   ADL Screening (condition at time of admission) Independently performs ADLs?: Yes (appropriate for developmental age) Is the patient deaf or have difficulty hearing?: No Does the patient have difficulty seeing, even when wearing glasses/contacts?: No Does the patient have difficulty concentrating, remembering, or  making decisions?: No  Permission Sought/Granted Permission sought to share information with : Family Supports    Share Information with NAME: Home health           Emotional Assessment       Orientation: : Oriented to Self, Oriented to Place, Oriented to  Time, Oriented to Situation      Admission diagnosis:  Hypokalemia [E87.6] Influenza A [J10.1] Elevated troponin level [R79.89] Demand ischemia (HCC) [I24.89] Altered mental status, unspecified altered mental status type [R41.82] Sepsis due to pneumonia (HCC) [J18.9, A41.9] Community acquired pneumonia, unspecified laterality [J18.9] Sepsis without acute organ dysfunction, due to unspecified organism [facility]) [A41.9] Patient Active Problem List   Diagnosis Date Noted   Sepsis due to pneumonia (HCC) 08/13/2023   Influenza and pneumonia 02/20/2023   Acute respiratory failure with hypoxia (HCC) 02/20/2023   History of bilateral carotid endarterectomy 02/20/2023   Vomiting and diarrhea 02/20/2023   Renal artery stenosis (HCC) 02/13/2022   History of temporal arteritis 12/04/2021   Ileus due to infection (HCC) 10/28/2021   Sepsis (HCC) 10/28/2021   Pneumonia 10/28/2021   Chronic kidney disease, stage 3a (HCC) 10/27/2021   Temporal arteritis (HCC) 10/27/2021   Edema, unspecified 10/24/2021   Simple renal cyst 10/24/2021   MGUS (monoclonal gammopathy of unknown significance) 09/19/2021   Anemia 06/06/2021  Chronic kidney disease 06/06/2021   Primary osteoarthritis 06/06/2021   UTI due to Klebsiella species 12/19/2020   Hypokalemia 12/19/2020   Hypomagnesemia 12/19/2020   Small intestinal bacterial overgrowth (SIBO) 12/19/2020   Abnormality of gait 12/04/2020   Melena    LLQ abdominal pain    Post herpetic neuralgia    Enteritis of infectious origin 11/26/2020   Constipation, slow transit 04/22/2019   Esophageal diverticulum 04/22/2019   Greater trochanteric bursitis of right hip 02/02/2019   Diabetic neuropathy  (HCC) 12/17/2018   PAD (peripheral artery disease) (HCC) 10/05/2018   Urinary incontinence in female 09/08/2018   Carotid stenosis, bilateral 12/24/2017   Syncope and collapse 11/15/2017   CAD (coronary artery disease) 11/15/2017   Leg pain 10/26/2017   Bilateral carotid artery disease (HCC) 07/11/2016   Primary osteoarthritis involving multiple joints 07/11/2016   Pure hypercholesterolemia 07/11/2016   Carotid stenosis 07/01/2016   Hyperlipidemia 07/01/2016   Peripheral sensory neuropathy (HCC) 03/25/2016   Type 2 diabetes mellitus with stage 3a chronic kidney disease, without long-term current use of insulin  (HCC) 03/25/2016   Anxiety 11/24/2015   Sensory peripheral neuropathy (HCC) 10/30/2015   CKD stage 3 due to type 2 diabetes mellitus (HCC) 07/19/2015   Osteopenia 07/19/2015   Chronic insomnia 01/25/2015   Diet-controlled type 2 diabetes mellitus (HCC) 01/25/2015   GERD without esophagitis 01/25/2015   Mixed stress and urge urinary incontinence 01/25/2015   Atrophic vaginitis 11/16/2014   Vitamin B 12 deficiency 10/26/2014   Abdominal pain, chronic, epigastric 06/15/2014   Essential hypertension 04/28/2014   Sciatica of left side 04/07/2014   HTN (hypertension) 01/28/2014   Type 2 diabetes mellitus (HCC) 01/28/2014   PCP:  Auston Reyes BIRCH, MD Pharmacy:   [facility] [phone] - [place], [place] - [address] [place] [address] Phone: [phone] Fax: [phone]     Social Drivers of Health (SDOH) Social History: SDOH Screenings   Food Insecurity: No Food Insecurity (08/14/2023)  Housing: Low Risk  (08/14/2023)  Transportation Needs: No Transportation Needs (08/14/2023)  Utilities: Not At Risk (08/14/2023)  Social Connections: Unknown (08/14/2023)  Tobacco Use: Low Risk  (08/13/2023)   SDOH Interventions:     Readmission Risk Interventions     No data to display

## 2023-08-16 DIAGNOSIS — A419 Sepsis, unspecified organism: Secondary | ICD-10-CM | POA: Diagnosis not present

## 2023-08-16 DIAGNOSIS — J189 Pneumonia, unspecified organism: Secondary | ICD-10-CM | POA: Diagnosis not present

## 2023-08-16 MED ORDER — HYDRALAZINE HCL 25 MG PO TABS
25.0000 mg | ORAL_TABLET | Freq: Three times a day (TID) | ORAL | Status: DC
  Administered 2023-08-16: 25 mg via ORAL
  Filled 2023-08-16: qty 1

## 2023-08-16 MED ORDER — OSELTAMIVIR PHOSPHATE 30 MG PO CAPS: 30.0000 mg | ORAL_CAPSULE | Freq: Two times a day (BID) | ORAL | 0 refills | Status: AC

## 2023-08-16 MED ORDER — METOPROLOL TARTRATE 50 MG PO TABS: 50.0000 mg | ORAL_TABLET | Freq: Two times a day (BID) | ORAL | 0 refills | Status: AC

## 2023-08-16 MED ORDER — FELODIPINE ER 5 MG PO TB24
5.0000 mg | ORAL_TABLET | Freq: Every morning | ORAL | Status: DC
  Administered 2023-08-16: 5 mg via ORAL
  Filled 2023-08-16: qty 1

## 2023-08-16 NOTE — TOC Progression Note (Addendum)
 Transition of Care [facility]) - Progression Note    Patient Details  Name: Dawn Mcintyre MRN: [id number] Date of Birth: 1932/11/28  Transition of Care [facility]) CM/SW Contact  Carmelita FORBES Carbon, LCSW Phone Number: 08/16/2023, 10:03 AM  Clinical Narrative:    CSW reviewed chart. Per [facility], patient had [facility] in the past. Reached out to [facility] to see if they can take patient for [facility] services - left a VM for [facility] requesting a return call. Will order RW day of discharge per [employer] request due to inclement weather this weekend.     Barriers to Discharge: Continued Medical Work up  Expected Discharge Plan and Services                         DME Arranged: Walker rolling DME Agency: [employer] Date DME Agency Contacted: 08/15/23                 Social Determinants of Health (SDOH) Interventions SDOH Screenings   Food Insecurity: No Food Insecurity (08/14/2023)  Housing: Low Risk  (08/14/2023)  Transportation Needs: No Transportation Needs (08/14/2023)  Utilities: Not At Risk (08/14/2023)  Social Connections: Unknown (08/14/2023)  Tobacco Use: Low Risk  (08/13/2023)    Readmission Risk Interventions     No data to display

## 2023-08-16 NOTE — Plan of Care (Signed)
  Problem: Education: Goal: Knowledge of General Education information will improve Description: Including pain rating scale, medication(s)/side effects and non-pharmacologic comfort measures Outcome: Adequate for Discharge   Problem: Health Behavior/Discharge Planning: Goal: Ability to manage health-related needs will improve Outcome: Adequate for Discharge   Problem: Clinical Measurements: Goal: Ability to maintain clinical measurements within normal limits will improve Outcome: Adequate for Discharge Goal: Will remain free from infection Outcome: Adequate for Discharge Goal: Diagnostic test results will improve Outcome: Adequate for Discharge Goal: Respiratory complications will improve Outcome: Adequate for Discharge Goal: Cardiovascular complication will be avoided Outcome: Adequate for Discharge   Problem: Activity: Goal: Risk for activity intolerance will decrease Outcome: Adequate for Discharge   Problem: Nutrition: Goal: Adequate nutrition will be maintained Outcome: Adequate for Discharge   Problem: Coping: Goal: Level of anxiety will decrease Outcome: Adequate for Discharge   Problem: Elimination: Goal: Will not experience complications related to bowel motility Outcome: Adequate for Discharge Goal: Will not experience complications related to urinary retention Outcome: Adequate for Discharge   Problem: Pain Management: Goal: General experience of comfort will improve Outcome: Adequate for Discharge   Problem: Safety: Goal: Ability to remain free from injury will improve Outcome: Adequate for Discharge   Problem: Skin Integrity: Goal: Risk for impaired skin integrity will decrease Outcome: Adequate for Discharge   Problem: Activity: Goal: Ability to tolerate increased activity will improve Outcome: Adequate for Discharge   Problem: Clinical Measurements: Goal: Ability to maintain a body temperature in the normal range will improve Outcome: Adequate  for Discharge   Problem: Respiratory: Goal: Ability to maintain adequate ventilation will improve Outcome: Adequate for Discharge Goal: Ability to maintain a clear airway will improve Outcome: Adequate for Discharge

## 2023-08-16 NOTE — TOC Transition Note (Addendum)
 Transition of Care [facility]) - Discharge Note   Patient Details  Name: Dawn Mcintyre MRN: [id number] Date of Birth: [birth year]  Transition of Care [facility]) CM/SW Contact:  Jaxden Blyden E Savian Mazon, LCSW Phone Number: 08/16/2023, 12:39 PM   Clinical Narrative:    Patient has orders to discharge home today. Family friend is picking her up per RN. Larraine with [facility] accepted for [facility] and is aware of DC today - asked DO for orders. RW referral made to Mitch with [employer] for bedside delivery today before DC - notified RN that there may be a delay due to weather but it should be delivered today per Mitch.   12:48- Now [employer] may not be able to deliver the RW until tomorrow- Mitch with [employer] is checking.   1:18- Patient has chosen to go home and use her sister's walker until [employer] can deliver it to the home. Updated Mitch with [employer] who confirms they will deliver the walker to patient's home either tomorrow 1/12 or Monday 1/13.  Final next level of care: Home w Home Health Services Barriers to Discharge: Barriers Resolved   Patient Goals and CMS Choice Patient states their goals for this hospitalization and ongoing recovery are:: return home with [facility] CMS Medicare.gov Compare Post Acute Care list provided to:: Patient        Discharge Placement                       Discharge Plan and Services Additional resources added to the After Visit Summary for                  DME Arranged: Walker rolling DME Agency: [employer] Date DME Agency Contacted: 08/16/23   Representative spoke with at DME Agency: [place] HH Arranged: PT, OT HH Agency: [facility] Date [facility] Agency Contacted: 08/16/23   Representative spoke with at [facility] Agency: Larraine  Social Drivers of Health (SDOH) Interventions SDOH Screenings   Food Insecurity: No Food Insecurity (08/14/2023)  Housing: Low Risk  (08/14/2023)  Transportation Needs: No Transportation Needs (08/14/2023)  Utilities: Not At Risk (08/14/2023)   Social Connections: Unknown (08/14/2023)  Tobacco Use: Low Risk  (08/13/2023)     Readmission Risk Interventions     No data to display

## 2023-08-16 NOTE — Progress Notes (Signed)
 Patient wheeled to the medical mall exit and discharged to the care of family in stable condition. DME will be delivered to patients home.  [name]

## 2023-08-16 NOTE — Discharge Summary (Signed)
 Physician Discharge Summary   Patient: Dawn Mcintyre MRN: [id number] DOB: [birth year]  Admit date:     08/12/2023  Discharge date: 08/16/23  Discharge Physician: Lorane Poland   PCP: Auston Reyes BIRCH, MD   Recommendations at discharge:    Follow up with very care doctor within 2 weeks for further blood pressure management  Discharge Diagnoses: Principal Problem:   Sepsis due to pneumonia [facility]) Active Problems:   Influenza and pneumonia  Resolved Problems:   * No resolved hospital problems. *  Hospital Course: Dawn Mcintyre is an 89 year old female with hypertension, type 2 diabetes, CKD stage III, hyperlipidemia, GERD, Barrett's esophagus, B12 deficiency, anemia, temporal arteritis, peripheral neuropathy, urinary incontinence, osteopenia, who was brought in to the ED for altered mental status.  Patient has recently completed treatment for UTI outpatient with Ceftin.  On arrival to the ED she was febrile, tachypneic, and had O2 sat of 91% on room air.  She was initially started on broad-spectrum antibiotics for concern of community-acquired pneumonia.  She later resulted flu A positive with unremarkable procalcitonin.  Antibiotics were discontinued.  Patient was able to wean to room air and by reevaluation on 1/11 she had significant improvement and was back to her mental status baseline.  She endorsed feeling prepared to discharge home.  I have also confirmed this plan with her son, Dawn Mcintyre, who also confirms that the patient has assistance at home with multiple different caregivers.  She will be discharging directly home today.     Consultants: n/a Procedures performed: n/a  Disposition: Home Diet recommendation:  Discharge Diet Orders (From admission, onward)     Start     Ordered   08/16/23 0000  Diet - low sodium heart healthy        08/16/23 1206           Regular diet DISCHARGE MEDICATION: Allergies as of 08/16/2023       Reactions   Codeine Hives   Hydrocodone  Nausea  Only   Metronidazole Nausea Only   Propoxyphene Nausea Only   Sulfa  Antibiotics Hives   Amoxicillin-pot Clavulanate Diarrhea   Latex Rash        Medication List     STOP taking these medications    acetaminophen  500 MG tablet Commonly known as: TYLENOL    amoxicillin-clavulanate 500-125 MG tablet Commonly known as: AUGMENTIN   nitrofurantoin  (macrocrystal-monohydrate) 100 MG capsule Commonly known as: MACROBID    pantoprazole  40 MG tablet Commonly known as: Protonix        TAKE these medications    ALPRAZolam  0.25 MG tablet Commonly known as: XANAX  Take 0.25 mg by mouth at bedtime as needed for sleep.   ascorbic acid  1000 MG tablet Commonly known as: VITAMIN C Take 1,000 mg by mouth daily.   aspirin  81 MG tablet Take 81 mg by mouth daily.   atorvastatin  10 MG tablet Commonly known as: Lipitor Take 1 tablet (10 mg total) by mouth daily.   clobetasol  cream 0.05 % Commonly known as: TEMOVATE  Apply 1 Application topically 2 (two) times daily. Apply to affected area   cyanocobalamin 1000 MCG/ML injection Commonly known as: VITAMIN B12 Inject into the muscle.   felodipine  5 MG 24 hr tablet Commonly known as: PLENDIL  Take 5 mg by mouth every morning.   fexofenadine 180 MG tablet Commonly known as: ALLEGRA Take 180 mg by mouth daily as needed for allergies.   fluticasone  50 MCG/ACT nasal spray Commonly known as: FLONASE  Place into both nostrils.   gabapentin  100 MG  capsule Commonly known as: NEURONTIN  Take 100 mg by mouth 3 (three) times daily.   hydrALAZINE  25 MG tablet Commonly known as: APRESOLINE  Take 25 mg by mouth 3 (three) times daily.   metoprolol  tartrate 50 MG tablet Commonly known as: LOPRESSOR  Take 1 tablet (50 mg total) by mouth 2 (two) times daily. What changed:  medication strength how much to take   mirabegron  ER 50 MG Tb24 tablet Commonly known as: Myrbetriq  Take 1 tablet (50 mg total) by mouth daily.   oseltamivir  30 MG  capsule Commonly known as: TAMIFLU  Take 1 capsule (30 mg total) by mouth 2 (two) times daily for 2 days.   predniSONE  2.5 MG tablet Commonly known as: DELTASONE  Take 2.5 mg by mouth daily.   Premarin  vaginal cream Generic drug: conjugated estrogens  Apply one pea-sized amount around the opening of the urethra three times weekly.   Vitamin D3 1.25 MG (50000 UT) Caps Take 1 capsule by mouth daily.   vitamin E 180 MG (400 UNITS) capsule Take 400 Units by mouth daily.               Durable Medical Equipment  (From admission, onward)           Start     Ordered   08/16/23 1156  For home use only DME Walker rolling  Once       Question Answer Comment  Walker: With 5 Inch Wheels   Patient needs a walker to treat with the following condition SOB (shortness of breath)      08/16/23 1155            Discharge Exam: Filed Weights   08/13/23 1225  Weight: 64 kg   General exam: Appears calm and comfortable, NAD  Respiratory system: No work of breathing, symmetric chest wall expansion, +cough Cardiovascular system: S1 & S2 heard, RRR.  Gastrointestinal system: Abdomen is nondistended, soft and nontender.  Neuro: Alert and oriented. No focal neurological deficits. Extremities: Symmetric, expected ROM Skin: No rashes, lesions Psychiatry: Demonstrates appropriate judgement and insight. Mood & affect appropriate for situation.   Condition at discharge: stable  The results of significant diagnostics from this hospitalization (including imaging, microbiology, ancillary and laboratory) are listed below for reference.   Imaging Studies: CT ABDOMEN PELVIS W CONTRAST Result Date: 08/13/2023 CLINICAL DATA:  Altered mental status. Abdominal pain. Recent diagnosis of UTI. Noncompliant with antibiotics. EXAM: CT ABDOMEN AND PELVIS WITH CONTRAST TECHNIQUE: Multidetector CT imaging of the abdomen and pelvis was performed using the standard protocol following bolus administration of  intravenous contrast. RADIATION DOSE REDUCTION: This exam was performed according to the departmental dose-optimization program which includes automated exposure control, adjustment of the mA and/or kV according to patient size and/or use of iterative reconstruction technique. CONTRAST:  OMNIPAQUE  IOHEXOL  300 MG/ML  SOLN COMPARISON:  CT abdomen and pelvis 10/27/2021 FINDINGS: Lower chest: Bronchial wall thickening and infiltrates in the lower lobes. Hepatobiliary: No acute abnormality. Pancreas: Unremarkable. Spleen: Unremarkable. Adrenals/Urinary Tract: Stable adrenal glands. No urinary calculi or hydronephrosis. Unremarkable bladder. Stomach/Bowel: Normal caliber large and small bowel. Extensive sigmoid diverticulosis without diverticulitis. Stomach is within normal limits. The appendix is not visualized. No secondary signs of appendicitis. Vascular/Lymphatic: Advanced atherosclerotic calcification of the aorta and its mesenteric, renal, iliac artery branches. No lymphadenopathy. Reproductive: Unremarkable. Other: No free intraperitoneal fluid or air. Musculoskeletal: No acute fracture. IMPRESSION: 1. No acute abnormality in the abdomen or pelvis. 2. Bronchial wall thickening and infiltrates in the lower lobes likely due  to bronchopneumonia and/or aspiration. 3. Extensive sigmoid diverticulosis without diverticulitis. Aortic Atherosclerosis (ICD10-I70.0). Electronically Signed   By: Norman Gatlin M.D.   On: 08/13/2023 01:18   DG Chest Port 1 View Result Date: 08/13/2023 CLINICAL DATA:  Questionable sepsis - evaluate for abnormality. Altered mental status EXAM: PORTABLE CHEST 1 VIEW COMPARISON:  02/20/2023 FINDINGS: Heart borderline in size. Mediastinal contours within normal limits. No confluent opacities, effusions or edema. No acute bony abnormality. IMPRESSION: No active disease. Electronically Signed   By: Franky Crease M.D.   On: 08/13/2023 00:23    Microbiology: Results for orders placed or  performed during the hospital encounter of 08/12/23  Urine Culture     Status: None   Collection Time: 08/12/23 11:46 PM   Specimen: Urine, Catheterized  Result Value Ref Range Status   Specimen Description   Final    URINE, CATHETERIZED Performed at [facility], [address], [place], [place] [address]    Special Requests   Final    NONE Performed at [facility], [address], [place], [place] [address]    Culture   Final    NO GROWTH Performed at [facility], [address], [place], [place] [address]    Report Status 08/14/2023 FINAL  Final  Resp panel by RT-PCR (RSV, Flu A&B, Covid) Anterior Nasal Swab     Status: Abnormal   Collection Time: 08/13/23 12:22 AM   Specimen: Anterior Nasal Swab  Result Value Ref Range Status   SARS Coronavirus 2 by RT PCR NEGATIVE NEGATIVE Final    Comment: (NOTE) SARS-CoV-2 target nucleic acids are NOT DETECTED.  The SARS-CoV-2 RNA is generally detectable in upper respiratory specimens during the acute phase of infection. The lowest concentration of SARS-CoV-2 viral copies this assay can detect is 138 copies/mL. A negative result does not preclude SARS-Cov-2 infection and should not be used as the sole basis for treatment or other patient management decisions. A negative result may occur with  improper specimen collection/handling, submission of specimen other than nasopharyngeal swab, presence of viral mutation(s) within the areas targeted by this assay, and inadequate number of viral copies(<138 copies/mL). A negative result must be combined with clinical observations, patient history, and epidemiological information. The expected result is Negative.  Fact Sheet for Patients:  bloggercourse.com  Fact Sheet for Healthcare Providers:  seriousbroker.it  This test is no t yet approved or cleared by the [place]  FDA and  has been authorized for detection  and/or diagnosis of SARS-CoV-2 by FDA under an Emergency Use Authorization (EUA). This EUA will remain  in effect (meaning this test can be used) for the duration of the COVID-19 declaration under Section 564(b)(1) of the Act, 21 U.S.C.section 360bbb-3(b)(1), unless the authorization is terminated  or revoked sooner.       Influenza A by PCR POSITIVE (A) NEGATIVE Final   Influenza B by PCR NEGATIVE NEGATIVE Final    Comment: (NOTE) The Xpert Xpress SARS-CoV-2/FLU/RSV plus assay is intended as an aid in the diagnosis of influenza from Nasopharyngeal swab specimens and should not be used as a sole basis for treatment. Nasal washings and aspirates are unacceptable for Xpert Xpress SARS-CoV-2/FLU/RSV testing.  Fact Sheet for Patients: bloggercourse.com  Fact Sheet for Healthcare Providers: seriousbroker.it  This test is not yet approved or cleared by the [place]  FDA and has been authorized for detection and/or diagnosis of SARS-CoV-2 by FDA under an Emergency Use Authorization (EUA). This EUA will remain in effect (meaning  this test can be used) for the duration of the COVID-19 declaration under Section 564(b)(1) of the Act, 21 U.S.C. section 360bbb-3(b)(1), unless the authorization is terminated or revoked.     Resp Syncytial Virus by PCR NEGATIVE NEGATIVE Final    Comment: (NOTE) Fact Sheet for Patients: bloggercourse.com  Fact Sheet for Healthcare Providers: seriousbroker.it  This test is not yet approved or cleared by the [place]  FDA and has been authorized for detection and/or diagnosis of SARS-CoV-2 by FDA under an Emergency Use Authorization (EUA). This EUA will remain in effect (meaning this test can be used) for the duration of the COVID-19 declaration under Section 564(b)(1) of the Act, 21 U.S.C. section 360bbb-3(b)(1), unless the authorization is  terminated or revoked.  Performed at [facility], [address] [place], [place] [address]   Blood Culture (routine x 2)     Status: None (Preliminary result)   Collection Time: 08/13/23 12:22 AM   Specimen: BLOOD  Result Value Ref Range Status   Specimen Description BLOOD BLOOD LEFT HAND  Final   Special Requests   Final    BOTTLES DRAWN AEROBIC AND ANAEROBIC Blood Culture adequate volume   Culture   Final    NO GROWTH 3 DAYS Performed at [facility], [address], [place], [place] [address]    Report Status PENDING  Incomplete  Blood Culture (routine x 2)     Status: None (Preliminary result)   Collection Time: 08/13/23 12:30 AM   Specimen: BLOOD  Result Value Ref Range Status   Specimen Description BLOOD BLOOD LEFT ARM  Final   Special Requests   Final    BOTTLES DRAWN AEROBIC AND ANAEROBIC Blood Culture results may not be optimal due to an excessive volume of blood received in culture bottles   Culture   Final    NO GROWTH 3 DAYS Performed at [facility], [address] [place], [place] [address]    Report Status PENDING  Incomplete  MRSA Next Gen by PCR, Nasal     Status: Abnormal   Collection Time: 08/13/23  3:35 AM   Specimen: Nasal Mucosa; Nasal Swab  Result Value Ref Range Status   MRSA by PCR Next Gen (A) NOT DETECTED Final    INVALID, UNABLE TO DETERMINE THE PRESENCE OF TARGET DUE TO SPECIMEN INTEGRITY. RECOLLECTION REQUESTED.    Comment: Results Called to:  WADDELL CRAZE AT 9472 08/13/23 JG Performed at [facility], [address] [place], [place] [address]   MRSA Next Gen by PCR, Nasal     Status: Abnormal   Collection Time: 08/13/23  5:47 AM  Result Value Ref Range Status   MRSA by PCR Next Gen DETECTED (A) NOT DETECTED Final    Comment: RESULT CALLED TO, READ BACK BY AND VERIFIED WITH: CARLYON POLLACK, RN [phone] 08/13/23 GM (NOTE) The GeneXpert MRSA Assay (FDA approved for NASAL specimens only), is one  component of a comprehensive MRSA colonization surveillance program. It is not intended to diagnose MRSA infection nor to guide or monitor treatment for MRSA infections. Test performance is not FDA approved in patients less than 75 years old. Performed at [facility], [address] [place], [place] [address]   C Difficile Quick Screen w PCR reflex     Status: None   Collection Time: 08/13/23  1:32 PM   Specimen: STOOL  Result Value Ref Range Status   C Diff antigen NEGATIVE NEGATIVE Final   C Diff toxin NEGATIVE NEGATIVE  Final   C Diff interpretation No C. difficile detected.  Final    Comment: Performed at [facility], [address] [place], [place] [address]    Labs: CBC: Recent Labs  Lab 08/12/23 2346 08/13/23 0414 08/14/23 0542  WBC 5.9 4.7 5.1  NEUTROABS 4.5  --  3.3  HGB 10.9* 9.0* 11.3*  HCT 33.8* 28.3* 34.8*  MCV 94.4 95.9 93.3  PLT 173 140* 131*   Basic Metabolic Panel: Recent Labs  Lab 08/12/23 2346 08/13/23 0414 08/13/23 1034 08/14/23 0542  NA 137 139 141 136  K 3.1* 3.1* 3.6 3.4*  CL 103 112* 109 101  CO2 24 22 23 23   GLUCOSE 124* 110* 109* 101*  BUN 13 10 10 8   CREATININE 0.99 0.76 0.95 0.86  CALCIUM  9.6 7.7* 8.5* 8.8*   Liver Function Tests: Recent Labs  Lab 08/12/23 2346  AST 20  ALT 12  ALKPHOS 56  BILITOT 0.5  PROT 6.6  ALBUMIN 3.8   CBG: No results for input(s): GLUCAP in the last 168 hours.  Discharge time spent: greater than 30 minutes.  Signed: Anwita Mencer, DO [facility] 08/16/2023

## 2023-08-16 NOTE — Progress Notes (Signed)
 Discharge education completed. IV removed without complications. Son notified of patient being discharged. Cousin on the way to transport patient home. Patient verified she does have electricity at home.  Cornell Barman Matha Masse

## 2023-08-16 NOTE — Hospital Course (Signed)
 Ms. Fuson is an 89 year old female with hypertension, type 2 diabetes, CKD stage III, hyperlipidemia, GERD, Barrett's esophagus, B12 deficiency, anemia, temporal arteritis, peripheral neuropathy, urinary incontinence, osteopenia, who was brought in to the ED for altered mental status.  Patient has recently completed treatment for UTI outpatient with Ceftin.  On arrival to the ED she was febrile, tachypneic, and had O2 sat of 91% on room air.  She was initially started on broad-spectrum antibiotics for concern of community-acquired pneumonia.  She later resulted flu A positive with unremarkable procalcitonin.  Antibiotics were discontinued.  Patient was able to wean to room air and by reevaluation on 1/11 she had significant improvement and was back to her mental status baseline.  She endorsed feeling prepared to discharge home.  I have also confirmed this plan with her son, [name], who also confirms that the patient has assistance at home with multiple different caregivers.  She will be discharging directly home today.

## 2023-08-18 LAB — CULTURE, BLOOD (ROUTINE X 2)
Culture: NO GROWTH
Culture: NO GROWTH
Special Requests: ADEQUATE

## 2023-09-30 ENCOUNTER — Other Ambulatory Visit: Payer: Self-pay | Admitting: Internal Medicine

## 2023-09-30 DIAGNOSIS — R0602 Shortness of breath: Secondary | ICD-10-CM

## 2023-09-30 DIAGNOSIS — R079 Chest pain, unspecified: Secondary | ICD-10-CM

## 2023-10-07 ENCOUNTER — Encounter (HOSPITAL_COMMUNITY): Payer: Self-pay

## 2023-10-08 ENCOUNTER — Telehealth (HOSPITAL_COMMUNITY): Payer: Self-pay | Admitting: *Deleted

## 2023-10-08 NOTE — Telephone Encounter (Signed)
 Reaching out to patient to offer assistance regarding upcoming cardiac imaging study; pt verbalizes understanding of appt date/time, parking situation and where to check in, pre-test NPO status and medications ordered, and verified current allergies; name and call back number provided for further questions should they arise Johney Frame RN Navigator Cardiac Imaging [facility] [phone] office [phone] cell

## 2023-10-09 ENCOUNTER — Ambulatory Visit
Admission: RE | Admit: 2023-10-09 | Discharge: 2023-10-09 | Disposition: A | Discharge status: Home / Self Care | Payer: Medicare Other | Source: Ambulatory Visit | Attending: Internal Medicine | Admitting: Internal Medicine

## 2023-10-09 DIAGNOSIS — I2510 Atherosclerotic heart disease of native coronary artery without angina pectoris: Secondary | ICD-10-CM | POA: Insufficient documentation

## 2023-10-09 DIAGNOSIS — R079 Chest pain, unspecified: Secondary | ICD-10-CM | POA: Insufficient documentation

## 2023-10-09 DIAGNOSIS — R0602 Shortness of breath: Secondary | ICD-10-CM | POA: Insufficient documentation

## 2023-10-09 MED ORDER — SODIUM CHLORIDE 0.9 % IV BOLUS
150.0000 mL | Freq: Once | INTRAVENOUS | Status: AC
  Administered 2023-10-09: 150 mL via INTRAVENOUS

## 2023-10-09 MED ORDER — IOHEXOL 350 MG/ML SOLN
75.0000 mL | Freq: Once | INTRAVENOUS | Status: AC | PRN
  Administered 2023-10-09: 75 mL via INTRAVENOUS

## 2023-10-09 MED ORDER — NITROGLYCERIN 0.4 MG SL SUBL
0.8000 mg | SUBLINGUAL_TABLET | Freq: Once | SUBLINGUAL | Status: AC
  Administered 2023-10-09: 0.8 mg via SUBLINGUAL

## 2023-10-09 NOTE — Progress Notes (Signed)
 Patient tolerated procedure well. Ambulate w/o difficulty. Denies light headedness or being dizzy. Sitting up drinking water provided. Encouraged to drink extra water today and reasoning explained. Verbalized understanding. All questions answered. ABC intact. No further needs. Discharge from procedure area w/o issues.

## 2023-11-24 ENCOUNTER — Other Ambulatory Visit: Payer: Self-pay

## 2023-11-24 DIAGNOSIS — D509 Iron deficiency anemia, unspecified: Secondary | ICD-10-CM

## 2023-11-24 DIAGNOSIS — D472 Monoclonal gammopathy: Secondary | ICD-10-CM

## 2023-11-25 ENCOUNTER — Inpatient Hospital Stay: Payer: Medicare Other | Attending: Internal Medicine

## 2023-11-25 DIAGNOSIS — N1832 Chronic kidney disease, stage 3b: Secondary | ICD-10-CM | POA: Insufficient documentation

## 2023-11-25 DIAGNOSIS — D472 Monoclonal gammopathy: Secondary | ICD-10-CM | POA: Insufficient documentation

## 2023-11-25 DIAGNOSIS — D649 Anemia, unspecified: Secondary | ICD-10-CM | POA: Insufficient documentation

## 2023-11-28 ENCOUNTER — Other Ambulatory Visit: Payer: Self-pay | Admitting: Nurse Practitioner

## 2023-11-28 DIAGNOSIS — I701 Atherosclerosis of renal artery: Secondary | ICD-10-CM

## 2023-11-28 DIAGNOSIS — I10 Essential (primary) hypertension: Secondary | ICD-10-CM

## 2023-12-01 ENCOUNTER — Ambulatory Visit
Admission: RE | Admit: 2023-12-01 | Discharge: 2023-12-01 | Disposition: A | Discharge status: Home / Self Care | Source: Ambulatory Visit | Attending: Nurse Practitioner | Admitting: Nurse Practitioner

## 2023-12-01 DIAGNOSIS — Z959 Presence of cardiac and vascular implant and graft, unspecified: Secondary | ICD-10-CM | POA: Insufficient documentation

## 2023-12-01 DIAGNOSIS — I10 Essential (primary) hypertension: Secondary | ICD-10-CM | POA: Diagnosis present

## 2023-12-01 DIAGNOSIS — I701 Atherosclerosis of renal artery: Secondary | ICD-10-CM | POA: Diagnosis present

## 2023-12-02 ENCOUNTER — Inpatient Hospital Stay

## 2023-12-02 ENCOUNTER — Inpatient Hospital Stay: Payer: Medicare Other | Admitting: Internal Medicine

## 2023-12-02 ENCOUNTER — Other Ambulatory Visit

## 2023-12-02 DIAGNOSIS — D472 Monoclonal gammopathy: Secondary | ICD-10-CM

## 2023-12-02 DIAGNOSIS — D649 Anemia, unspecified: Secondary | ICD-10-CM | POA: Diagnosis not present

## 2023-12-02 DIAGNOSIS — N1832 Chronic kidney disease, stage 3b: Secondary | ICD-10-CM | POA: Diagnosis not present

## 2023-12-02 DIAGNOSIS — D509 Iron deficiency anemia, unspecified: Secondary | ICD-10-CM

## 2023-12-02 LAB — CMP (CANCER CENTER ONLY)
ALT: 14 U/L (ref 0–44)
AST: 19 U/L (ref 15–41)
Albumin: 3.8 g/dL (ref 3.5–5.0)
Alkaline Phosphatase: 66 U/L (ref 38–126)
Anion gap: 8 (ref 5–15)
BUN: 22 mg/dL (ref 8–23)
CO2: 25 mmol/L (ref 22–32)
Calcium: 9.7 mg/dL (ref 8.9–10.3)
Chloride: 104 mmol/L (ref 98–111)
Creatinine: 1.18 mg/dL — ABNORMAL HIGH (ref 0.44–1.00)
GFR, Estimated: 44 mL/min — ABNORMAL LOW (ref >60)
Glucose, Bld: 131 mg/dL — ABNORMAL HIGH (ref 70–99)
Potassium: 3.7 mmol/L (ref 3.5–5.1)
Sodium: 137 mmol/L (ref 135–145)
Total Bilirubin: 0.5 mg/dL (ref 0.0–1.2)
Total Protein: 6.5 g/dL (ref 6.5–8.1)

## 2023-12-02 LAB — IRON AND TIBC
Iron: 85 ug/dL (ref 28–170)
Saturation Ratios: 23 % (ref 10.4–31.8)
TIBC: 371 ug/dL (ref 250–450)
UIBC: 286 ug/dL

## 2023-12-02 LAB — CBC WITH DIFFERENTIAL (CANCER CENTER ONLY)
Abs Immature Granulocytes: 0.02 10*3/uL (ref 0.00–0.07)
Basophils Absolute: 0.1 10*3/uL (ref 0.0–0.1)
Basophils Relative: 1 %
Eosinophils Absolute: 0.1 10*3/uL (ref 0.0–0.5)
Eosinophils Relative: 1 %
HCT: 35.3 % — ABNORMAL LOW (ref 36.0–46.0)
Hemoglobin: 11.4 g/dL — ABNORMAL LOW (ref 12.0–15.0)
Immature Granulocytes: 0 %
Lymphocytes Relative: 22 %
Lymphs Abs: 1.5 10*3/uL (ref 0.7–4.0)
MCH: 30.4 pg (ref 26.0–34.0)
MCHC: 32.3 g/dL (ref 30.0–36.0)
MCV: 94.1 fL (ref 80.0–100.0)
Monocytes Absolute: 0.8 10*3/uL (ref 0.1–1.0)
Monocytes Relative: 12 %
Neutro Abs: 4.3 10*3/uL (ref 1.7–7.7)
Neutrophils Relative %: 64 %
Platelet Count: 216 10*3/uL (ref 150–400)
RBC: 3.75 MIL/uL — ABNORMAL LOW (ref 3.87–5.11)
RDW: 12.9 % (ref 11.5–15.5)
WBC Count: 6.7 10*3/uL (ref 4.0–10.5)
nRBC: 0 % (ref 0.0–0.2)

## 2023-12-02 LAB — FERRITIN: Ferritin: 26 ng/mL (ref 11–307)

## 2023-12-03 LAB — KAPPA/LAMBDA LIGHT CHAINS
Kappa free light chain: 15.7 mg/L (ref 3.3–19.4)
Kappa, lambda light chain ratio: 1.13 (ref 0.26–1.65)
Lambda free light chains: 13.9 mg/L (ref 5.7–26.3)

## 2023-12-05 LAB — MULTIPLE MYELOMA PANEL, SERUM
Albumin SerPl Elph-Mcnc: 3.6 g/dL (ref 2.9–4.4)
Albumin/Glob SerPl: 1.6 (ref 0.7–1.7)
Alpha 1: 0.2 g/dL (ref 0.0–0.4)
Alpha2 Glob SerPl Elph-Mcnc: 0.7 g/dL (ref 0.4–1.0)
B-Globulin SerPl Elph-Mcnc: 0.9 g/dL (ref 0.7–1.3)
Gamma Glob SerPl Elph-Mcnc: 0.6 g/dL (ref 0.4–1.8)
Globulin, Total: 2.4 g/dL (ref 2.2–3.9)
IgA: 139 mg/dL (ref 64–422)
IgG (Immunoglobin G), Serum: 685 mg/dL (ref 586–1602)
IgM (Immunoglobulin M), Srm: 28 mg/dL (ref 26–217)
M Protein SerPl Elph-Mcnc: 0.2 g/dL — ABNORMAL HIGH
Total Protein ELP: 6 g/dL (ref 6.0–8.5)

## 2023-12-10 ENCOUNTER — Encounter: Payer: Self-pay | Admitting: Internal Medicine

## 2023-12-10 ENCOUNTER — Inpatient Hospital Stay: Attending: Internal Medicine | Admitting: Internal Medicine

## 2023-12-10 DIAGNOSIS — N183 Chronic kidney disease, stage 3 unspecified: Secondary | ICD-10-CM | POA: Diagnosis not present

## 2023-12-10 DIAGNOSIS — Z79899 Other long term (current) drug therapy: Secondary | ICD-10-CM | POA: Insufficient documentation

## 2023-12-10 DIAGNOSIS — D649 Anemia, unspecified: Secondary | ICD-10-CM | POA: Diagnosis not present

## 2023-12-10 DIAGNOSIS — Z801 Family history of malignant neoplasm of trachea, bronchus and lung: Secondary | ICD-10-CM | POA: Insufficient documentation

## 2023-12-10 DIAGNOSIS — D472 Monoclonal gammopathy: Secondary | ICD-10-CM | POA: Insufficient documentation

## 2023-12-10 NOTE — Assessment & Plan Note (Addendum)
#  October 2022 [neurology] IgG lambda 0.2 g/dL; kappa lambda light chain ratio normal.  Chronic kidney disease GFR 50-60; mild anemia hemoglobin 11; normal calcium .  No worsening bone pain. # APRIL 2025-0.2 gm/dl Immunofixation shows IgG monoclonal protein with kappa light chain Specificity; K/L light chain= normal.  Clinically MGUS, no significant concerns of any progression of disease. Not related to multiple myeloma.    # Anemia-hemoglobin around 11 stable.  April 2024 ferritin- 16; I sat- 22  recommend continue gentle iron .   #Chronic kidney disease-stage-stage III [Dr. Singh/Dr. Kolluru.]; 44-  Recommend increased fluid intake.   Stable  # PVD- s/p renal renal stent [Right; Dr.Schneir]- stable.   # DISPOSITION: # Follow up in 6  months-MD;  2 weeks Prior- cbc/cmp/MM panel; K/l light chains; iron  studies/ferritin-Dr.B

## 2023-12-10 NOTE — Progress Notes (Signed)
 [facility] CONSULT NOTE  Patient Care Team: Yehuda Helms, MD as PCP - General (Internal Medicine) Gwyn Leos, MD as Consulting Physician (Hematology) Rosan Comfort, MD as Consulting Physician (Neurology)  CHIEF COMPLAINTS/PURPOSE OF CONSULTATION: Monoclonal gammopathy  HEMATOLOGY HISTORY  # OCT 2022 [Dr.Shah; Neuropathy-]Immunofixation shows IgG monoclonal protein with lambda light chain specificity; K/L=WNL; MAY 2023- 0.1mg /dl; K/l= N.   # CKD stage- IIIA- [Dr.Korrpati]; # Temporal arteritis: Continue the small dose of prednisone  daily; OA  HISTORY OF PRESENTING ILLNESS: Alone.  Ambulating independently.  Dawn Mcintyre 89 y.o.  female wit CKD stage III, mild anemia and MGUS is here for a follow up..  Patient denies any new symptoms.  Denies any worsening joint pains or bone pain.  Chronic osteoarthritis.  Review of Systems  Constitutional:  Positive for malaise/fatigue. Negative for chills, diaphoresis, fever and weight loss.  HENT:  Negative for nosebleeds and sore throat.   Eyes:  Negative for double vision.  Respiratory:  Negative for cough, hemoptysis, sputum production, shortness of breath and wheezing.   Cardiovascular:  Negative for chest pain, palpitations, orthopnea and leg swelling.  Gastrointestinal:  Negative for abdominal pain, blood in stool, constipation, diarrhea, heartburn, melena, nausea and vomiting.  Genitourinary:  Negative for dysuria, frequency and urgency.  Musculoskeletal:  Negative for back pain and joint pain.  Skin: Negative.  Negative for itching and rash.  Neurological:  Positive for tingling. Negative for dizziness, focal weakness, weakness and headaches.  Endo/Heme/Allergies:  Does not bruise/bleed easily.  Psychiatric/Behavioral:  Negative for depression. The patient is not nervous/anxious and does not have insomnia.     MEDICAL HISTORY:  Past Medical History:  Diagnosis Date   Arteritis (HCC)    Arthritis     Atrophic vaginitis 11/16/2014   Carotid artery stenosis    Cataract    Difficult intubation    Environmental and seasonal allergies    GERD (gastroesophageal reflux disease)    Hyperlipidemia    Hypertension    Lack of bladder control    Neuropathy    Osteoporosis    Pneumonia     x 75yrs ago   Pre-diabetes    Shingles    Ulcer     SURGICAL HISTORY: Past Surgical History:  Procedure Laterality Date   ABDOMINAL HYSTERECTOMY  1972   BREAST CYST EXCISION Right 1972   BREAST CYST EXCISION Left 1972   ENDARTERECTOMY Left 12/24/2017   Procedure: ENDARTERECTOMY CAROTID;  Surgeon: Jackquelyn Mass, MD;  Location: [facility];  Service: Vascular;  Laterality: Left;   ENDARTERECTOMY Right 03/13/2018   Procedure: ENDARTERECTOMY CAROTID;  Surgeon: Jackquelyn Mass, MD;  Location: [facility];  Service: Vascular;  Laterality: Right;   ESOPHAGOGASTRODUODENOSCOPY (EGD) WITH PROPOFOL  N/A 11/28/2020   Procedure: ESOPHAGOGASTRODUODENOSCOPY (EGD) WITH PROPOFOL ;  Surgeon: Marnee Sink, MD;  Location: [facility];  Service: Endoscopy;  Laterality: N/A;   EYE SURGERY     RENAL ANGIOGRAPHY N/A 03/05/2022   Procedure: RENAL ANGIOGRAPHY;  Surgeon: Jackquelyn Mass, MD;  Location: [facility];  Service: Cardiovascular;  Laterality: N/A;   THROAT SURGERY     x 10 yrs ago. throat mass.   TONSILLECTOMY      SOCIAL HISTORY: Social History   Socioeconomic History   Marital status: Widowed    Spouse name: Not on file   Number of children: Not on file   Years of education: Not on file   Highest education level: Not on file  Occupational History  Not on file  Tobacco Use   Smoking status: Never    Passive exposure: Never   Smokeless tobacco: Never  Vaping Use   Vaping status: Never Used  Substance and Sexual Activity   Alcohol use: No    Alcohol/week: 0.0 standard drinks of alcohol   Drug use: No   Sexual activity: Not Currently  Other Topics Concern   Not on file  Social History  Narrative   Not on file   Social Drivers of Health   Financial Resource Strain: Not on file  Food Insecurity: No Food Insecurity (08/14/2023)   Hunger Vital Sign    Worried About Running Out of Food in the Last Year: Never true    Ran Out of Food in the Last Year: Never true  Transportation Needs: No Transportation Needs (08/14/2023)   PRAPARE - Administrator, Civil Service (Medical): No    Lack of Transportation (Non-Medical): No  Physical Activity: Not on file  Stress: Not on file  Social Connections: Unknown (08/14/2023)   Social Connection and Isolation Panel [NHANES]    Frequency of Communication with Friends and Family: More than three times a week    Frequency of Social Gatherings with Friends and Family: More than three times a week    Attends Religious Services: Not on file    Active Member of Clubs or Organizations: Not on file    Attends Banker Meetings: Not on file    Marital Status: Not on file  Intimate Partner Violence: Not At Risk (08/14/2023)   Humiliation, Afraid, Rape, and Kick questionnaire    Fear of Current or Ex-Partner: No    Emotionally Abused: No    Physically Abused: No    Sexually Abused: No    FAMILY HISTORY: Family History  Problem Relation Age of Onset   Anuerysm Mother    Prostate cancer Father    Esophageal cancer Sister    Lung cancer Sister    Diabetes Brother    Hearing loss Brother    Bladder Cancer Neg Hx    Kidney cancer Neg Hx    Breast cancer Neg Hx     ALLERGIES:  is allergic to codeine, hydrocodone , metronidazole, propoxyphene, sulfa  antibiotics, amoxicillin-pot clavulanate, and latex.  MEDICATIONS:  Current Outpatient Medications  Medication Sig Dispense Refill   ALPRAZolam  (XANAX ) 0.25 MG tablet Take 0.25 mg by mouth at bedtime as needed for sleep.     ascorbic acid  (VITAMIN C) 1000 MG tablet Take 1,000 mg by mouth daily.     aspirin  81 MG tablet Take 81 mg by mouth daily.     Cholecalciferol (VITAMIN  D3) 1.25 MG (50000 UT) CAPS Take 1 capsule by mouth daily.     conjugated estrogens  (PREMARIN ) vaginal cream Apply one pea-sized amount around the opening of the urethra three times weekly. 30 g 3   cyanocobalamin (VITAMIN B12) 1000 MCG/ML injection Inject into the muscle.     felodipine  (PLENDIL ) 5 MG 24 hr tablet Take 5 mg by mouth every morning.     fexofenadine (ALLEGRA) 180 MG tablet Take 180 mg by mouth daily as needed for allergies.      fluticasone  (FLONASE ) 50 MCG/ACT nasal spray Place into both nostrils.     gabapentin  (NEURONTIN ) 100 MG capsule Take 100 mg by mouth 3 (three) times daily.     hydrALAZINE  (APRESOLINE ) 25 MG tablet Take 25 mg by mouth 3 (three) times daily.     mirabegron  ER (MYRBETRIQ ) 50  MG TB24 tablet Take 1 tablet (50 mg total) by mouth daily. 90 tablet 3   predniSONE  (DELTASONE ) 2.5 MG tablet Take 2.5 mg by mouth daily.     atorvastatin  (LIPITOR) 10 MG tablet Take 1 tablet (10 mg total) by mouth daily. 30 tablet 4   metoprolol  tartrate (LOPRESSOR ) 50 MG tablet Take 1 tablet (50 mg total) by mouth 2 (two) times daily. 60 tablet 0   No current facility-administered medications for this visit.    PHYSICAL EXAMINATION:   Vitals:   12/10/23 1022  BP: (!) 137/57  Pulse: (!) 58  Resp: 20  Temp: (!) 96.2 F (35.7 C)  SpO2: 99%    Filed Weights   12/10/23 1022  Weight: 138 lb 8 oz (62.8 kg)    Physical Exam Vitals and nursing note reviewed.  HENT:     Head: Normocephalic and atraumatic.     Mouth/Throat:     Pharynx: Oropharynx is clear.  Eyes:     Extraocular Movements: Extraocular movements intact.     Pupils: Pupils are equal, round, and reactive to light.  Cardiovascular:     Rate and Rhythm: Normal rate and regular rhythm.  Pulmonary:     Comments: Decreased breath sounds bilaterally.  Abdominal:     Palpations: Abdomen is soft.  Musculoskeletal:        General: Normal range of motion.     Cervical back: Normal range of motion.  Skin:     General: Skin is warm.  Neurological:     General: No focal deficit present.     Mental Status: She is alert and oriented to person, place, and time.  Psychiatric:        Behavior: Behavior normal.        Judgment: Judgment normal.     LABORATORY DATA:  I have reviewed the data as listed Lab Results  Component Value Date   WBC 6.7 12/02/2023   HGB 11.4 (L) 12/02/2023   HCT 35.3 (L) 12/02/2023   MCV 94.1 12/02/2023   PLT 216 12/02/2023   Recent Labs    05/20/23 1349 08/12/23 2346 08/13/23 0414 08/13/23 1034 08/14/23 0542 12/02/23 1352  NA 136 137   < > 141 136 137  K 4.0 3.1*   < > 3.6 3.4* 3.7  CL 105 103   < > 109 101 104  CO2 23 24   < > 23 23 25   GLUCOSE 152* 124*   < > 109* 101* 131*  BUN 18 13   < > 10 8 22   CREATININE 1.05* 0.99   < > 0.95 0.86 1.18*  CALCIUM  9.8 9.6   < > 8.5* 8.8* 9.7  GFRNONAA 51* 55*   < > 58* >60 44*  PROT 6.6 6.6  --   --   --  6.5  ALBUMIN 4.0 3.8  --   --   --  3.8  AST 22 20  --   --   --  19  ALT 13 12  --   --   --  14  ALKPHOS 71 56  --   --   --  66  BILITOT 0.5 0.5  --   --   --  0.5   < > = values in this interval not displayed.     US  RENAL ARTERY DUPLEX COMPLETE Result Date: 12/07/2023 CLINICAL DATA:  History of renal artery stenosis status post stent placement. Worsening hypertension EXAM: RENAL/URINARY TRACT ULTRASOUND RENAL DUPLEX DOPPLER ULTRASOUND  COMPARISON:  CT of the abdomen and pelvis performed August 13, 2023 FINDINGS: Right Kidney: Length: 9.2 cm. Echogenicity within normal limits. No mass or hydronephrosis visualized. Left Kidney: Length: 9.3 cm. Echogenicity within normal limits. An anechoic left renal cyst is present which measures 1.2 cm. Bladder:  Partially decompressed and grossly unremarkable. RENAL DUPLEX ULTRASOUND Right Renal Artery Velocities: Origin:  100 cm/sec Mid:  105 cm/sec Hilum:  83 cm/sec Interlobar:  33 cm/sec Arcuate:  30 cm/sec Left Renal Artery Velocities: Origin:  92 cm/sec Mid:  160 cm/sec  Hilum:  151 cm/sec Interlobar:  41 cm/sec Arcuate:  22 cm/sec Aortic Velocity:  108 cm/sec Right Renal-Aortic Ratios: Origin: 0.9 Mid:  1 Hilum: 0.8 Left Renal-Aortic Ratios: Origin: 0.9 Mid: 1.5 Hilum: 1.5 IMPRESSION: 1. No ultrasound evidence of significant renal artery stenosis. 2. Although the stent is not clearly seen, the right renal artery velocity and waveform morphology is within normal limits. Electronically Signed   By: Reagan Camera M.D.   On: 12/07/2023 15:53    Lab Results  Component Value Date   KPAFRELGTCHN 15.7 12/02/2023   KPAFRELGTCHN 17.5 05/20/2023   KPAFRELGTCHN 15.2 11/20/2022   LAMBDASER 13.9 12/02/2023   LAMBDASER 13.8 05/20/2023   LAMBDASER 11.2 11/20/2022   KAPLAMBRATIO 1.13 12/02/2023   KAPLAMBRATIO 1.27 05/20/2023   KAPLAMBRATIO 1.36 11/20/2022     MGUS (monoclonal gammopathy of unknown significance) #October 2022 [neurology] IgG lambda 0.2 g/dL; kappa lambda light chain ratio normal.  Chronic kidney disease GFR 50-60; mild anemia hemoglobin 11; normal calcium .  No worsening bone pain. # APRIL 2025-0.2 gm/dl Immunofixation shows IgG monoclonal protein with kappa light chain Specificity; K/L light chain= normal.  Clinically MGUS, no significant concerns of any progression of disease. Not related to multiple myeloma.    # Anemia-hemoglobin around 11 stable.  April 2024 ferritin- 16; I sat- 22  recommend continue gentle iron .   #Chronic kidney disease-stage-stage III [Dr. Singh/Dr. Kolluru.]; 44-  Recommend increased fluid intake.   Stable  # PVD- s/p renal renal stent [Right; Dr.Schneir]- stable.   # DISPOSITION: # Follow up in 6  months-MD;  2 weeks Prior- cbc/cmp/MM panel; K/l light chains; iron  studies/ferritin-Dr.B     All questions were answered. The patient knows to call the clinic with any problems, questions or concerns.      Gwyn Leos, MD 12/10/2023 11:11 AM

## 2023-12-10 NOTE — Progress Notes (Signed)
 U/S renal 12/01/23, CT coronary 10/09/23, CT abdomen/pelvis 08/13/23, chest xray 08/13/23.

## 2023-12-11 ENCOUNTER — Ambulatory Visit (INDEPENDENT_AMBULATORY_CARE_PROVIDER_SITE_OTHER): Admitting: Nurse Practitioner

## 2023-12-11 ENCOUNTER — Encounter (INDEPENDENT_AMBULATORY_CARE_PROVIDER_SITE_OTHER): Payer: Self-pay | Admitting: Vascular Surgery

## 2023-12-11 VITALS — BP 127/66 | HR 55 | Resp 18 | Ht 67.0 in | Wt 137.2 lb

## 2023-12-11 DIAGNOSIS — I10 Essential (primary) hypertension: Secondary | ICD-10-CM | POA: Diagnosis not present

## 2023-12-11 DIAGNOSIS — I6523 Occlusion and stenosis of bilateral carotid arteries: Secondary | ICD-10-CM | POA: Diagnosis not present

## 2023-12-11 DIAGNOSIS — E1159 Type 2 diabetes mellitus with other circulatory complications: Secondary | ICD-10-CM

## 2023-12-15 NOTE — Progress Notes (Signed)
 Subjective:    Patient ID: Dawn Mcintyre, female    DOB: 1932/12/18, 89 y.o.   MRN: [id number] Chief Complaint  Patient presents with   Follow-up    *Urgent. LS 04/14/23. consult see GS/FB.Significant right carotid ICA stenosis & left CCA stenosis. Carotid done 4.25.25- in Care everywhere  ref: Glenard Lander - patient wanted to wait until 5/22. offered her sooner w/FB    The patient is an 89 year old female who was sent today as a referral from her cardiologist regarding her carotid artery studies.  Her right ICA had a 50 to 69% stenosis with a hemodynamically significant stenosis of the left.  She denies any TIA or amaurosis fugax like symptoms.  She denies any amaurosis fugax.  Initially also driving is concerned was a visual disturbances.  She notes this only occurred for about 3 days and attributes it mostly to her allergies and the heavy pollen that we were having around that timeframe as well.    Review of Systems  All other systems reviewed and are negative.      Objective:    Physical Exam Vitals reviewed.  HENT:     Head: Normocephalic.  Neck:     Vascular: No carotid bruit.  Cardiovascular:     Rate and Rhythm: Normal rate.     Pulses:          Radial pulses are 2+ on the right side and 2+ on the left side.  Pulmonary:     Effort: Pulmonary effort is normal.  Skin:    General: Skin is warm and dry.  Neurological:     Mental Status: She is alert and oriented to person, place, and time.  Psychiatric:        Mood and Affect: Mood normal.        Behavior: Behavior normal.        Thought Content: Thought content normal.        Judgment: Judgment normal.     BP 127/66   Pulse (!) 55   Resp 18   Ht 5\' 7"  (1.702 m)   Wt 137 lb 3.2 oz (62.2 kg)   BMI 21.49 kg/m   Past Medical History:  Diagnosis Date   Arteritis (HCC)    Arthritis    Atrophic vaginitis 11/16/2014   Carotid artery stenosis    Cataract    Difficult intubation    Environmental and  seasonal allergies    GERD (gastroesophageal reflux disease)    Hyperlipidemia    Hypertension    Lack of bladder control    Neuropathy    Osteoporosis    Pneumonia     x 79yrs ago   Pre-diabetes    Shingles    Ulcer     Social History   Socioeconomic History   Marital status: Widowed    Spouse name: Not on file   Number of children: Not on file   Years of education: Not on file   Highest education level: Not on file  Occupational History   Not on file  Tobacco Use   Smoking status: Never    Passive exposure: Never   Smokeless tobacco: Never  Vaping Use   Vaping status: Never Used  Substance and Sexual Activity   Alcohol use: No    Alcohol/week: 0.0 standard drinks of alcohol   Drug use: No   Sexual activity: Not Currently  Other Topics Concern   Not on file  Social History Narrative   Not on  file   Social Drivers of Health   Financial Resource Strain: Not on file  Food Insecurity: No Food Insecurity (08/14/2023)   Hunger Vital Sign    Worried About Running Out of Food in the Last Year: Never true    Ran Out of Food in the Last Year: Never true  Transportation Needs: No Transportation Needs (08/14/2023)   PRAPARE - Administrator, Civil Service (Medical): No    Lack of Transportation (Non-Medical): No  Physical Activity: Not on file  Stress: Not on file  Social Connections: Unknown (08/14/2023)   Social Connection and Isolation Panel [NHANES]    Frequency of Communication with Friends and Family: More than three times a week    Frequency of Social Gatherings with Friends and Family: More than three times a week    Attends Religious Services: Not on file    Active Member of Clubs or Organizations: Not on file    Attends Banker Meetings: Not on file    Marital Status: Not on file  Intimate Partner Violence: Not At Risk (08/14/2023)   Humiliation, Afraid, Rape, and Kick questionnaire    Fear of Current or Ex-Partner: No    Emotionally  Abused: No    Physically Abused: No    Sexually Abused: No    Past Surgical History:  Procedure Laterality Date   ABDOMINAL HYSTERECTOMY  1972   BREAST CYST EXCISION Right 1972   BREAST CYST EXCISION Left 1972   ENDARTERECTOMY Left 12/24/2017   Procedure: ENDARTERECTOMY CAROTID;  Surgeon: Jackquelyn Mass, MD;  Location: [facility];  Service: Vascular;  Laterality: Left;   ENDARTERECTOMY Right 03/13/2018   Procedure: ENDARTERECTOMY CAROTID;  Surgeon: Jackquelyn Mass, MD;  Location: [facility];  Service: Vascular;  Laterality: Right;   ESOPHAGOGASTRODUODENOSCOPY (EGD) WITH PROPOFOL  N/A 11/28/2020   Procedure: ESOPHAGOGASTRODUODENOSCOPY (EGD) WITH PROPOFOL ;  Surgeon: Marnee Sink, MD;  Location: [facility];  Service: Endoscopy;  Laterality: N/A;   EYE SURGERY     RENAL ANGIOGRAPHY N/A 03/05/2022   Procedure: RENAL ANGIOGRAPHY;  Surgeon: Jackquelyn Mass, MD;  Location: [facility];  Service: Cardiovascular;  Laterality: N/A;   THROAT SURGERY     x 10 yrs ago. throat mass.   TONSILLECTOMY      Family History  Problem Relation Age of Onset   Anuerysm Mother    Prostate cancer Father    Esophageal cancer Sister    Lung cancer Sister    Diabetes Brother    Hearing loss Brother    Bladder Cancer Neg Hx    Kidney cancer Neg Hx    Breast cancer Neg Hx     Allergies  Allergen Reactions   Codeine Hives   Hydrocodone  Nausea Only   Metronidazole Nausea Only   Propoxyphene Nausea Only   Sulfa  Antibiotics Hives   Amoxicillin-Pot Clavulanate Diarrhea   Latex Rash       Latest Ref Rng & Units 12/02/2023    1:52 PM 08/14/2023    5:42 AM 08/13/2023    4:14 AM  CBC  WBC 4.0 - 10.5 K/uL 6.7  5.1  4.7   Hemoglobin 12.0 - 15.0 g/dL 16.1  09.6  9.0   Hematocrit 36.0 - 46.0 % 35.3  34.8  28.3   Platelets 150 - 400 K/uL 216  131  140        CMP     Component Value Date/Time   NA 137 12/02/2023 1352   NA 139 09/12/2011  0250   K 3.7 12/02/2023 1352   K 3.5 09/12/2011  0250   CL 104 12/02/2023 1352   CL 102 09/12/2011 0250   CO2 25 12/02/2023 1352   CO2 23 09/12/2011 0250   GLUCOSE 131 (H) 12/02/2023 1352   GLUCOSE 162 (H) 09/12/2011 0250   BUN 22 12/02/2023 1352   BUN 20 05/30/2016 1358   BUN 23 (H) 09/12/2011 0250   CREATININE 1.18 (H) 12/02/2023 1352   CREATININE 0.94 09/12/2011 0250   CALCIUM  9.7 12/02/2023 1352   CALCIUM  9.1 09/12/2011 0250   PROT 6.5 12/02/2023 1352   PROT 7.2 09/12/2011 0250   ALBUMIN 3.8 12/02/2023 1352   ALBUMIN 3.6 09/12/2011 0250   AST 19 12/02/2023 1352   ALT 14 12/02/2023 1352   ALT 14 09/12/2011 0250   ALKPHOS 66 12/02/2023 1352   ALKPHOS 58 09/12/2011 0250   BILITOT 0.5 12/02/2023 1352   GFRNONAA 44 (L) 12/02/2023 1352   GFRNONAA >60 09/12/2011 0250     No results found.     Assessment & Plan:   1. Bilateral carotid artery stenosis (Primary) The patient's studies indicate a 50 to 69% stenosis of the right ICA, as well as hemodynamically significant stenosis of the left ICA.  In evaluating the velocities I suspect this would be close to a 40 to 59% stenosis based on our criteria.  Will have the patient return closely for follow-up in 3 months.  2. Essential hypertension Continue antihypertensive medications as already ordered, these medications have been reviewed and there are no changes at this time.  3. Type 2 diabetes mellitus with other circulatory complication, without long-term current use of insulin  (HCC) Continue hypoglycemic medications as already ordered, these medications have been reviewed and there are no changes at this time.  Hgb A1C to be monitored as already arranged by primary service   Current Outpatient Medications on File Prior to Visit  Medication Sig Dispense Refill   ALPRAZolam  (XANAX ) 0.25 MG tablet Take 0.25 mg by mouth at bedtime as needed for sleep.     ascorbic acid  (VITAMIN C) 1000 MG tablet Take 1,000 mg by mouth daily.     aspirin  81 MG tablet Take 81 mg by mouth daily.      atorvastatin  (LIPITOR) 10 MG tablet Take 1 tablet (10 mg total) by mouth daily. 30 tablet 4   Cholecalciferol (VITAMIN D3) 1.25 MG (50000 UT) CAPS Take 1 capsule by mouth daily.     conjugated estrogens  (PREMARIN ) vaginal cream Apply one pea-sized amount around the opening of the urethra three times weekly. 30 g 3   cyanocobalamin (VITAMIN B12) 1000 MCG/ML injection Inject into the muscle.     felodipine  (PLENDIL ) 5 MG 24 hr tablet Take 5 mg by mouth every morning.     fexofenadine (ALLEGRA) 180 MG tablet Take 180 mg by mouth daily as needed for allergies.      fluticasone  (FLONASE ) 50 MCG/ACT nasal spray Place into both nostrils.     gabapentin  (NEURONTIN ) 100 MG capsule Take 100 mg by mouth 3 (three) times daily.     hydrALAZINE  (APRESOLINE ) 25 MG tablet Take 25 mg by mouth 3 (three) times daily.     metoprolol  tartrate (LOPRESSOR ) 50 MG tablet Take 1 tablet (50 mg total) by mouth 2 (two) times daily. 60 tablet 0   mirabegron  ER (MYRBETRIQ ) 50 MG TB24 tablet Take 1 tablet (50 mg total) by mouth daily. 90 tablet 3   predniSONE  (DELTASONE ) 2.5 MG tablet Take 2.5  mg by mouth daily.     No current facility-administered medications on file prior to visit.    There are no Patient Instructions on file for this visit. No follow-ups on file.   Moss Berry E Kahlen Morais, NP

## 2023-12-16 ENCOUNTER — Telehealth (INDEPENDENT_AMBULATORY_CARE_PROVIDER_SITE_OTHER): Payer: Self-pay

## 2023-12-17 NOTE — Telephone Encounter (Signed)
 Patient called called wanting an answer to whether the she should stop her plavix  as she was taken off. Per Wauneta Haddock the patient should continue and we will get back to her when she speaks with Dr. Prescilla Brod.

## 2023-12-17 NOTE — Telephone Encounter (Signed)
 Patient reach out stating that she had ultrasound done and renal stint was not able to be seen. Patient ultrasound was done on 12/01/23. Patient would like to know if she should follow up with our office. Please Advise

## 2023-12-17 NOTE — Telephone Encounter (Signed)
 Patient has been notified with medical recommendations and verbalized understanding

## 2023-12-17 NOTE — Telephone Encounter (Signed)
 Based on the images, it should not need repeat and there is not cause for concern.  The artery itself appears patent.  She should follow up in [place] as previously scheduled

## 2023-12-23 ENCOUNTER — Encounter (INDEPENDENT_AMBULATORY_CARE_PROVIDER_SITE_OTHER): Payer: Self-pay

## 2023-12-25 ENCOUNTER — Ambulatory Visit (INDEPENDENT_AMBULATORY_CARE_PROVIDER_SITE_OTHER): Admitting: Vascular Surgery

## 2024-03-11 ENCOUNTER — Ambulatory Visit (INDEPENDENT_AMBULATORY_CARE_PROVIDER_SITE_OTHER): Admitting: Vascular Surgery

## 2024-03-11 ENCOUNTER — Encounter (INDEPENDENT_AMBULATORY_CARE_PROVIDER_SITE_OTHER)

## 2024-04-14 ENCOUNTER — Other Ambulatory Visit (INDEPENDENT_AMBULATORY_CARE_PROVIDER_SITE_OTHER): Payer: Self-pay | Admitting: Vascular Surgery

## 2024-04-14 ENCOUNTER — Other Ambulatory Visit: Payer: Self-pay | Admitting: Internal Medicine

## 2024-04-14 DIAGNOSIS — I701 Atherosclerosis of renal artery: Secondary | ICD-10-CM

## 2024-04-14 DIAGNOSIS — Z1231 Encounter for screening mammogram for malignant neoplasm of breast: Secondary | ICD-10-CM

## 2024-04-14 DIAGNOSIS — I6523 Occlusion and stenosis of bilateral carotid arteries: Secondary | ICD-10-CM

## 2024-04-18 NOTE — Progress Notes (Signed)
 MRN : [id number]  Dawn Mcintyre is a 89 y.o. (1932-08-08) female who presents with chief complaint of check circulation.  History of Present Illness:   The patient returns today for follow up regarding right renal artery stent placement on 03/05/2022.    Today she is a little concerned that her blood pressure is still so variable.  She reports that frequently in the morning it seems to be somewhat elevated anywhere from the 150s up to 170 but most of the time it is in the 120s to 130s systolic.  She did report 1 episode of systolic pressures of 200 for which she took an extra clonidine .  No other complaints today blood pressure seems to be typical but she does note that it does vary somewhat day-to-day.   She is also followed for carotid stenosis.  The carotid stenosis followed by ultrasound.   The patient denies amaurosis fugax. There is no recent history of TIA symptoms or focal motor deficits. There is no prior documented CVA.  The patient is taking enteric-coated aspirin  81 mg daily.  The patient's bilateral internal carotid arteries also have no evidence of significant stenosis post bilateral carotid endarterectomies. The RICA 1-39% and the LICA 1-39%.  No change compared to previous study.   Duplex ultrasound of the renal arteries demonstrates the right and left renal arteries are widely patent.  The right renal artery stent is free of any flow abnormalities.  There is no evidence for restenosis.  There is a normal resistive index bilaterally with normal size kidneys bilaterally.  No significant change when compared to last year's study.  No outpatient medications have been marked as taking for the 04/19/24 encounter (Appointment) with Jama, Cordella MATSU, MD.    Past Medical History:  Diagnosis Date   Arteritis [facility])    Arthritis    Atrophic vaginitis 11/16/2014   Carotid artery stenosis    Cataract     Difficult intubation    Environmental and seasonal allergies    GERD (gastroesophageal reflux disease)    Hyperlipidemia    Hypertension    Lack of bladder control    Neuropathy    Osteoporosis    Pneumonia     x 25yrs ago   Pre-diabetes    Shingles    Ulcer     Past Surgical History:  Procedure Laterality Date   ABDOMINAL HYSTERECTOMY  1972   BREAST CYST EXCISION Right 1972   BREAST CYST EXCISION Left 1972   ENDARTERECTOMY Left 12/24/2017   Procedure: ENDARTERECTOMY CAROTID;  Surgeon: Jama Cordella MATSU, MD;  Location: [facility] ORS;  Service: Vascular;  Laterality: Left;   ENDARTERECTOMY Right 03/13/2018   Procedure: ENDARTERECTOMY CAROTID;  Surgeon: Jama Cordella MATSU, MD;  Location: [facility] ORS;  Service: Vascular;  Laterality: Right;   ESOPHAGOGASTRODUODENOSCOPY (EGD) WITH PROPOFOL  N/A 11/28/2020   Procedure: ESOPHAGOGASTRODUODENOSCOPY (EGD) WITH PROPOFOL ;  Surgeon: Jinny Carmine, MD;  Location: [facility] ENDOSCOPY;  Service: Endoscopy;  Laterality: N/A;   EYE SURGERY     RENAL ANGIOGRAPHY N/A 03/05/2022   Procedure: RENAL ANGIOGRAPHY;  Surgeon: Jama Cordella MATSU, MD;  Location: [facility]  INVASIVE CV LAB;  Service: Cardiovascular;  Laterality: N/A;   THROAT SURGERY     x 10 yrs ago. throat mass.   TONSILLECTOMY      Social History Social History   Tobacco Use   Smoking status: Never    Passive exposure: Never   Smokeless tobacco: Never  Vaping Use   Vaping status: Never Used  Substance Use Topics   Alcohol use: No    Alcohol/week: 0.0 standard drinks of alcohol   Drug use: No    Family History Family History  Problem Relation Age of Onset   Anuerysm Mother    Prostate cancer Father    Esophageal cancer Sister    Lung cancer Sister    Diabetes Brother    Hearing loss Brother    Bladder Cancer Neg Hx    Kidney cancer Neg Hx    Breast cancer Neg Hx     Allergies  Allergen Reactions   Codeine Hives   Hydrocodone  Nausea Only   Metronidazole Nausea Only   Propoxyphene  Nausea Only   Sulfa  Antibiotics Hives   Amoxicillin-Pot Clavulanate Diarrhea   Latex Rash     REVIEW OF SYSTEMS (Negative unless checked)  Constitutional: [] Weight loss  [] Fever  [] Chills Cardiac: [] Chest pain   [] Chest pressure   [] Palpitations   [] Shortness of breath when laying flat   [] Shortness of breath with exertion. Vascular:  [x] Pain in legs with walking   [] Pain in legs at rest  [] History of DVT   [] Phlebitis   [] Swelling in legs   [] Varicose veins   [] Non-healing ulcers Pulmonary:   [] Uses home oxygen   [] Productive cough   [] Hemoptysis   [] Wheeze  [] COPD   [] Asthma Neurologic:  [] Dizziness   [] Seizures   [] History of stroke   [] History of TIA  [] Aphasia   [] Vissual changes   [] Weakness or numbness in arm   [] Weakness or numbness in leg Musculoskeletal:   [] Joint swelling   [] Joint pain   [] Low back pain Hematologic:  [] Easy bruising  [] Easy bleeding   [] Hypercoagulable state   [] Anemic Gastrointestinal:  [] Diarrhea   [] Vomiting  [] Gastroesophageal reflux/heartburn   [] Difficulty swallowing. Genitourinary:  [] Chronic kidney disease   [] Difficult urination  [] Frequent urination   [] Blood in urine Skin:  [] Rashes   [] Ulcers  Psychological:  [] History of anxiety   []  History of major depression.  Physical Examination  There were no vitals filed for this visit. There is no height or weight on file to calculate BMI. Gen: WD/WN, NAD Head: [place]/AT, No temporalis wasting.  Ear/Nose/Throat: Hearing grossly intact, nares w/o erythema or drainage Eyes: PER, EOMI, sclera nonicteric.  Neck: Supple, no masses.  No bruit or JVD.  Pulmonary:  Good air movement, no audible wheezing, no use of accessory muscles.  Cardiac: RRR, normal S1, S2, no Murmurs. Vascular:  mild trophic changes, no open wounds Vessel Right Left  Radial Palpable Palpable  PT Not Palpable Not Palpable  DP Not Palpable Not Palpable  Gastrointestinal: soft, non-distended. No guarding/no peritoneal signs.   Musculoskeletal: M/S 5/5 throughout.  No visible deformity.  Neurologic: CN 2-12 intact. Pain and light touch intact in extremities.  Symmetrical.  Speech is fluent. Motor exam as listed above. Psychiatric: Judgment intact, Mood & affect appropriate for pt's clinical situation. Dermatologic: No rashes or ulcers noted.  No changes consistent with cellulitis.   CBC Lab Results  Component Value Date   WBC 6.7 12/02/2023   HGB 11.4 (L) 12/02/2023   HCT 35.3 (  L) 12/02/2023   MCV 94.1 12/02/2023   PLT 216 12/02/2023    BMET    Component Value Date/Time   NA 137 12/02/2023 1352   NA 139 09/12/2011 0250   K 3.7 12/02/2023 1352   K 3.5 09/12/2011 0250   CL 104 12/02/2023 1352   CL 102 09/12/2011 0250   CO2 25 12/02/2023 1352   CO2 23 09/12/2011 0250   GLUCOSE 131 (H) 12/02/2023 1352   GLUCOSE 162 (H) 09/12/2011 0250   BUN 22 12/02/2023 1352   BUN 20 05/30/2016 1358   BUN 23 (H) 09/12/2011 0250   CREATININE 1.18 (H) 12/02/2023 1352   CREATININE 0.94 09/12/2011 0250   CALCIUM  9.7 12/02/2023 1352   CALCIUM  9.1 09/12/2011 0250   GFRNONAA 44 (L) 12/02/2023 1352   GFRNONAA >60 09/12/2011 0250   GFRAA 55 (L) 10/04/2019 1828   GFRAA >60 09/12/2011 0250   CrCl cannot be calculated (Patient's most recent lab result is older than the maximum 21 days allowed.).  COAG Lab Results  Component Value Date   INR 1.2 08/12/2023   INR 1.1 10/28/2021   INR 0.99 03/05/2018    Radiology No results found.   Assessment/Plan 1. Bilateral carotid artery stenosis (Primary) Recommend:   Given the patient's asymptomatic subcritical stenosis no further invasive testing or surgery at this time.   Duplex ultrasound shows widely patent carotids post bilateral carotid endarterectomies. There is 1-39% stenosis noted bilaterally.  Unchanged from the previous exam.   Continue antiplatelet therapy as prescribed Continue management of CAD, HTN and Hyperlipidemia Healthy heart diet,  encouraged  exercise at least 4 times per week Follow up in 12 months with duplex ultrasound and physical exam  - VAS US  CAROTID; Future  2. Renal artery stenosis Recommend:   BP today was acceptable Given patient's atherosclerosis and PAD optimal control of the patient's hypertension is important.   The patient's BP and noninvasive studies support the previous right renal artery intervention is patent.   No further intervention is indicated at this time.   Therefore the patient  will continue the current medications, no changes at this time.   The primary medical service will continue aggressive antihypertensive therapy as per the AHA guidelines.   We will obtain a follow-up renal artery duplex in 12 months - VAS US  RENAL ARTERY DUPLEX; Future  3. PAD (peripheral artery disease) Recommend:   I do not find evidence of life style limiting vascular disease. The patient specifically denies life style limitation.   Previous noninvasive studies including ABI's of the legs do not identify critical vascular problems.   The patient should continue walking and begin a more formal exercise program. The patient should continue his antiplatelet therapy and aggressive treatment of the lipid abnormalities.   The patient is instructed to call the office if there is a significant change in the lower extremity symptoms, particularly if a wound develops or there is an abrupt increase in leg pain.   Patient will follow-up with me as ordered above  4. Coronary artery disease of native artery of native heart with stable angina pectoris Continue cardiac and antihypertensive medications as already ordered and reviewed, no changes at this time.  Continue statin as ordered and reviewed, no changes at this time  Nitrates PRN for chest pain  5. Essential hypertension Continue antihypertensive medications as already ordered, these medications have been reviewed and there are no changes at this  time.    Cordella Shawl, MD  04/18/2024 3:59 PM

## 2024-04-19 ENCOUNTER — Ambulatory Visit (INDEPENDENT_AMBULATORY_CARE_PROVIDER_SITE_OTHER): Payer: Medicare Other

## 2024-04-19 ENCOUNTER — Ambulatory Visit (INDEPENDENT_AMBULATORY_CARE_PROVIDER_SITE_OTHER): Payer: Medicare Other | Admitting: Vascular Surgery

## 2024-04-19 ENCOUNTER — Encounter (INDEPENDENT_AMBULATORY_CARE_PROVIDER_SITE_OTHER): Payer: Self-pay | Admitting: Vascular Surgery

## 2024-04-19 VITALS — BP 140/64 | HR 61 | Ht 67.0 in | Wt 139.2 lb

## 2024-04-19 DIAGNOSIS — I701 Atherosclerosis of renal artery: Secondary | ICD-10-CM

## 2024-04-19 DIAGNOSIS — I739 Peripheral vascular disease, unspecified: Secondary | ICD-10-CM | POA: Diagnosis not present

## 2024-04-19 DIAGNOSIS — I6523 Occlusion and stenosis of bilateral carotid arteries: Secondary | ICD-10-CM | POA: Diagnosis not present

## 2024-04-19 DIAGNOSIS — I25118 Atherosclerotic heart disease of native coronary artery with other forms of angina pectoris: Secondary | ICD-10-CM | POA: Diagnosis not present

## 2024-04-19 DIAGNOSIS — I10 Essential (primary) hypertension: Secondary | ICD-10-CM

## 2024-04-21 ENCOUNTER — Ambulatory Visit
Admission: RE | Admit: 2024-04-21 | Discharge: 2024-04-21 | Disposition: A | Discharge status: Home / Self Care | Source: Ambulatory Visit | Attending: Internal Medicine | Admitting: Internal Medicine

## 2024-04-21 DIAGNOSIS — Z1231 Encounter for screening mammogram for malignant neoplasm of breast: Secondary | ICD-10-CM | POA: Insufficient documentation

## 2024-04-23 ENCOUNTER — Other Ambulatory Visit: Payer: Self-pay | Admitting: Obstetrics and Gynecology

## 2024-04-23 DIAGNOSIS — N760 Acute vaginitis: Secondary | ICD-10-CM

## 2024-05-01 ENCOUNTER — Encounter (INDEPENDENT_AMBULATORY_CARE_PROVIDER_SITE_OTHER): Payer: Self-pay | Admitting: Vascular Surgery

## 2024-05-17 ENCOUNTER — Ambulatory Visit: Payer: Self-pay | Admitting: Urology

## 2024-05-17 VITALS — BP 125/63 | HR 60 | Ht 67.0 in | Wt 139.0 lb

## 2024-05-17 DIAGNOSIS — N3020 Other chronic cystitis without hematuria: Secondary | ICD-10-CM

## 2024-05-17 DIAGNOSIS — N3946 Mixed incontinence: Secondary | ICD-10-CM | POA: Diagnosis not present

## 2024-05-17 LAB — URINALYSIS, COMPLETE
Bilirubin, UA: NEGATIVE
Glucose, UA: NEGATIVE
Ketones, UA: NEGATIVE
Leukocytes,UA: NEGATIVE
Nitrite, UA: NEGATIVE
Protein,UA: NEGATIVE
RBC, UA: NEGATIVE
Specific Gravity, UA: 1.015 (ref 1.005–1.030)
Urobilinogen, Ur: 0.2 mg/dL (ref 0.2–1.0)
pH, UA: 6 (ref 5.0–7.5)

## 2024-05-17 LAB — MICROSCOPIC EXAMINATION: Epithelial Cells (non renal): >10 /HPF — AB (ref 0–10)

## 2024-05-17 MED ORDER — NITROFURANTOIN MONOHYD MACRO 100 MG PO CAPS: 100.0000 mg | ORAL_CAPSULE | Freq: Every day | ORAL | 3 refills | Status: AC

## 2024-05-17 MED ORDER — MIRABEGRON ER 50 MG PO TB24: 50.0000 mg | ORAL_TABLET | Freq: Every day | ORAL | 3 refills | Status: AC

## 2024-05-17 NOTE — Progress Notes (Signed)
 05/17/2024 9:54 AM   Hargis BIRCH Gramlich 05-06-1933 [id number]  Referring provider: Auston Reyes BIRCH, MD [address] [facility] [place],  [place] [address]  No chief complaint on file.   HPI: Multiple providers-recurrent UTIs 2020 and had a negative CT scan   Today Patient has a bladder infection every 2 months with abdominal pain frequency and foul-smelling urine that respond favorably to antibiotics.  She thinks she might be infected today because she has a little bit of terminal burning   At baseline she voids every 2 hours gets up least 4 times a day.  No ankle edema.  She is continent   She describes an arterial procedure on her renal artery that helped her hypertension with a stent in the last many months.  Kidneys were otherwise healthy January 17, 2022.  She had positive cultures in the medical record     Has recurrent bladder infections.  Pathophysiology described.  Based upon last positive culture and allergies I called in Macrodantin  100 mg 3 twice a day for 7 days.  Then I did put her on daily Macrodantin  100 mg 30 x 11.d  Return in 8 weeks for cystoscopy.  She gets hives with sulfa  drugs.  She gets diarrhea with Augmentin    Today Frequent stable.  Last culture positive Clinically infection free and she chose not to have cystoscopy which I thought was fine.  She still gets up 3 times at night.  She states she had some Myrbetriq  at home and she may or may not go back on it.   Today Infection free on Macrodantin . Did not go back on Myrbetriq  but she really thinks this helps the nighttime frequency getting up 3-4 times a night.  Clinically not infected  Both Myrbetriq  and Macrodantin  90 x 3 sent to pharmacy and I will see in a year   Today Frequency stable Patient may have had 1 or 2 breakthrough infections treated by primary care.  Clinically not infected.  She says she is doing a lot better with less infections.  Urge incontinence much better on  Myrbetriq   PMH: Past Medical History:  Diagnosis Date   Arteritis    Arthritis    Atrophic vaginitis 11/16/2014   Carotid artery stenosis    Cataract    Difficult intubation    Environmental and seasonal allergies    GERD (gastroesophageal reflux disease)    Hyperlipidemia    Hypertension    Lack of bladder control    Neuropathy    Osteoporosis    Pneumonia     x 61yrs ago   Pre-diabetes    Shingles    Ulcer     Surgical History: Past Surgical History:  Procedure Laterality Date   ABDOMINAL HYSTERECTOMY  1972   BREAST CYST EXCISION Right 1972   BREAST CYST EXCISION Left 1972   ENDARTERECTOMY Left 12/24/2017   Procedure: ENDARTERECTOMY CAROTID;  Surgeon: Jama Cordella MATSU, MD;  Location: [facility] OR[facility];  Service: Vascular;  Laterality: Left;   ENDARTERECTOMY Right 03/13/2018   Procedure: ENDARTERECTOMY CAROTID;  Surgeon: Jama Cordella MATSU, MD;  Location: [facility];  Service: Vascular;  Laterality: Right;   ESOPHAGOGASTRODUODENOSCOPY (EGD) WITH PROPOFOL  N/A 11/28/2020   Procedure: ESOPHAGOGASTRODUODENOSCOPY (EGD) WITH PROPOFOL ;  Surgeon: Jinny Carmine, MD;  Location: [facility] END[facility];  Service: Endoscopy;  Laterality: N/A;   EYE SURGERY     RENAL ANGIOGRAPHY N/A 03/05/2022   Procedure: RENAL ANGIOGRAPHY;  Surgeon: Jama Cordella MATSU, MD;  Location: [facility] INVASIVE CV  LAB;  Service: Cardiovascular;  Laterality: N/A;   THROAT SURGERY     x 10 yrs ago. throat mass.   TONSILLECTOMY      Home Medications:  Allergies as of 05/17/2024       Reactions   Codeine Hives   Hydrocodone  Nausea Only   Metronidazole Nausea Only   Propoxyphene Nausea Only   Sulfa  Antibiotics Hives   Amoxicillin-pot Clavulanate Diarrhea   Latex Rash        Medication List        Accurate as of May 17, 2024  9:54 AM. If you have any questions, ask your nurse or doctor.          ALPRAZolam  0.25 MG tablet Commonly known as: XANAX  Take 0.25 mg by mouth at bedtime as needed for sleep.   ascorbic  acid 1000 MG tablet Commonly known as: VITAMIN C Take 1,000 mg by mouth daily.   aspirin  81 MG tablet Take 81 mg by mouth daily.   atorvastatin  10 MG tablet Commonly known as: Lipitor Take 1 tablet (10 mg total) by mouth daily.   cyanocobalamin 1000 MCG/ML injection Commonly known as: VITAMIN B12 Inject into the muscle.   felodipine  5 MG 24 hr tablet Commonly known as: PLENDIL  Take 5 mg by mouth every morning.   fexofenadine 180 MG tablet Commonly known as: ALLEGRA Take 180 mg by mouth daily as needed for allergies.   fluticasone  50 MCG/ACT nasal spray Commonly known as: FLONASE  Place into both nostrils.   gabapentin  100 MG capsule Commonly known as: NEURONTIN  Take 100 mg by mouth 3 (three) times daily.   hydrALAZINE  25 MG tablet Commonly known as: APRESOLINE  Take 25 mg by mouth 3 (three) times daily.   metoprolol  tartrate 50 MG tablet Commonly known as: LOPRESSOR  Take 1 tablet (50 mg total) by mouth 2 (two) times daily.   mirabegron  ER 50 MG Tb24 tablet Commonly known as: Myrbetriq  Take 1 tablet (50 mg total) by mouth daily.   predniSONE  2.5 MG tablet Commonly known as: DELTASONE  Take 2.5 mg by mouth daily.   Premarin  vaginal cream Generic drug: conjugated estrogens  Apply one pea-sized amount around the opening of the urethra three times weekly.   Vitamin D3 1.25 MG (50000 UT) Caps Take 1 capsule by mouth daily.        Allergies:  Allergies  Allergen Reactions   Codeine Hives   Hydrocodone  Nausea Only   Metronidazole Nausea Only   Propoxyphene Nausea Only   Sulfa  Antibiotics Hives   Amoxicillin-Pot Clavulanate Diarrhea   Latex Rash    Family History: Family History  Problem Relation Age of Onset   Anuerysm Mother    Prostate cancer Father    Esophageal cancer Sister    Lung cancer Sister    Diabetes Brother    Hearing loss Brother    Bladder Cancer Neg Hx    Kidney cancer Neg Hx    Breast cancer Neg Hx     Social History:  reports  that she has never smoked. She has never been exposed to tobacco smoke. She has never used smokeless tobacco. She reports that she does not drink alcohol and does not use drugs.  ROS:                                        Physical Exam: There were no vitals taken for this visit.  Constitutional:  Alert and oriented, No acute distress. HEENT: [place] AT, moist mucus membranes.  Trachea midline, no masses.  Laboratory Data: Lab Results  Component Value Date   WBC 6.7 12/02/2023   HGB 11.4 (L) 12/02/2023   HCT 35.3 (L) 12/02/2023   MCV 94.1 12/02/2023   PLT 216 12/02/2023    Lab Results  Component Value Date   CREATININE 1.18 (H) 12/02/2023    No results found for: PSA  No results found for: TESTOSTERONE  Lab Results  Component Value Date   HGBA1C 6.0 (H) 10/28/2021    Urinalysis    Component Value Date/Time   COLORURINE YELLOW (A) 08/12/2023 2346   APPEARANCEUR CLEAR (A) 08/12/2023 2346   APPEARANCEUR Clear 05/19/2023 0949   LABSPEC 1.013 08/12/2023 2346   LABSPEC 1.026 09/12/2011 2304   PHURINE 5.0 08/12/2023 2346   GLUCOSEU NEGATIVE 08/12/2023 2346   GLUCOSEU Negative 09/12/2011 2304   HGBUR MODERATE (A) 08/12/2023 2346   BILIRUBINUR NEGATIVE 08/12/2023 2346   BILIRUBINUR Negative 05/19/2023 0949   BILIRUBINUR Negative 09/12/2011 2304   KETONESUR NEGATIVE 08/12/2023 2346   PROTEINUR 100 (A) 08/12/2023 2346   UROBILINOGEN 0.2 04/12/2021 1144   NITRITE NEGATIVE 08/12/2023 2346   LEUKOCYTESUR NEGATIVE 08/12/2023 2346   LEUKOCYTESUR 2+ 09/12/2011 2304    Pertinent Imaging: Urine reviewed and sent for culture  Assessment & Plan: Both medicines 90 x 3 sent to pharmacy and I will see in 1 year I reviewed the medical record and she had 1 negative culture and 1 positive culture I will not switch prophylaxis yet It was brought to my attention that she may not have been on the Macrodantin  and it was sent today  There are no diagnoses  linked to this encounter.  No follow-ups on file.  Glendia DELENA Elizabeth, MD  [facility] [address] [place], [place] [address] [phone]

## 2024-05-20 LAB — CULTURE, URINE COMPREHENSIVE

## 2024-05-27 ENCOUNTER — Other Ambulatory Visit: Payer: Self-pay

## 2024-05-27 DIAGNOSIS — D472 Monoclonal gammopathy: Secondary | ICD-10-CM

## 2024-05-27 DIAGNOSIS — D509 Iron deficiency anemia, unspecified: Secondary | ICD-10-CM

## 2024-05-28 ENCOUNTER — Inpatient Hospital Stay: Attending: Internal Medicine

## 2024-05-28 DIAGNOSIS — D472 Monoclonal gammopathy: Secondary | ICD-10-CM | POA: Diagnosis present

## 2024-05-28 DIAGNOSIS — D649 Anemia, unspecified: Secondary | ICD-10-CM | POA: Diagnosis not present

## 2024-05-28 DIAGNOSIS — D509 Iron deficiency anemia, unspecified: Secondary | ICD-10-CM

## 2024-05-28 DIAGNOSIS — N183 Chronic kidney disease, stage 3 unspecified: Secondary | ICD-10-CM | POA: Diagnosis not present

## 2024-05-28 LAB — CMP (CANCER CENTER ONLY)
ALT: 10 U/L (ref 0–44)
AST: 18 U/L (ref 15–41)
Albumin: 3.9 g/dL (ref 3.5–5.0)
Alkaline Phosphatase: 65 U/L (ref 38–126)
Anion gap: 9 (ref 5–15)
BUN: 20 mg/dL (ref 8–23)
CO2: 28 mmol/L (ref 22–32)
Calcium: 9.8 mg/dL (ref 8.9–10.3)
Chloride: 100 mmol/L (ref 98–111)
Creatinine: 0.94 mg/dL (ref 0.44–1.00)
GFR, Estimated: 58 mL/min — ABNORMAL LOW (ref >60)
Glucose, Bld: 117 mg/dL — ABNORMAL HIGH (ref 70–99)
Potassium: 3 mmol/L — ABNORMAL LOW (ref 3.5–5.1)
Sodium: 137 mmol/L (ref 135–145)
Total Bilirubin: 0.8 mg/dL (ref 0.0–1.2)
Total Protein: 6.9 g/dL (ref 6.5–8.1)

## 2024-05-28 LAB — CBC (CANCER CENTER ONLY)
HCT: 38.3 % (ref 36.0–46.0)
Hemoglobin: 12.5 g/dL (ref 12.0–15.0)
MCH: 30.7 pg (ref 26.0–34.0)
MCHC: 32.6 g/dL (ref 30.0–36.0)
MCV: 94.1 fL (ref 80.0–100.0)
Platelet Count: 197 K/uL (ref 150–400)
RBC: 4.07 MIL/uL (ref 3.87–5.11)
RDW: 12.4 % (ref 11.5–15.5)
WBC Count: 5.8 K/uL (ref 4.0–10.5)
nRBC: 0 % (ref 0.0–0.2)

## 2024-05-28 LAB — IRON AND TIBC
Iron: 85 ug/dL (ref 28–170)
Saturation Ratios: 23 % (ref 10.4–31.8)
TIBC: 364 ug/dL (ref 250–450)
UIBC: 279 ug/dL

## 2024-05-28 LAB — FERRITIN: Ferritin: 34 ng/mL (ref 11–307)

## 2024-05-31 LAB — KAPPA/LAMBDA LIGHT CHAINS
Kappa free light chain: 15.1 mg/L (ref 3.3–19.4)
Kappa, lambda light chain ratio: 0.96 (ref 0.26–1.65)
Lambda free light chains: 15.7 mg/L (ref 5.7–26.3)

## 2024-06-02 LAB — MULTIPLE MYELOMA PANEL, SERUM
Albumin SerPl Elph-Mcnc: 3.6 g/dL (ref 2.9–4.4)
Albumin/Glob SerPl: 1.3 (ref 0.7–1.7)
Alpha 1: 0.3 g/dL (ref 0.0–0.4)
Alpha2 Glob SerPl Elph-Mcnc: 0.8 g/dL (ref 0.4–1.0)
B-Globulin SerPl Elph-Mcnc: 1.1 g/dL (ref 0.7–1.3)
Gamma Glob SerPl Elph-Mcnc: 0.7 g/dL (ref 0.4–1.8)
Globulin, Total: 2.8 g/dL (ref 2.2–3.9)
IgA: 175 mg/dL (ref 64–422)
IgG (Immunoglobin G), Serum: 829 mg/dL (ref 586–1602)
IgM (Immunoglobulin M), Srm: 36 mg/dL (ref 26–217)
M Protein SerPl Elph-Mcnc: 0.2 g/dL — ABNORMAL HIGH
Total Protein ELP: 6.4 g/dL (ref 6.0–8.5)

## 2024-06-11 ENCOUNTER — Encounter: Payer: Self-pay | Admitting: Internal Medicine

## 2024-06-11 ENCOUNTER — Inpatient Hospital Stay: Attending: Internal Medicine | Admitting: Internal Medicine

## 2024-06-11 DIAGNOSIS — N183 Chronic kidney disease, stage 3 unspecified: Secondary | ICD-10-CM | POA: Diagnosis not present

## 2024-06-11 DIAGNOSIS — D472 Monoclonal gammopathy: Secondary | ICD-10-CM | POA: Diagnosis present

## 2024-06-11 DIAGNOSIS — Z79899 Other long term (current) drug therapy: Secondary | ICD-10-CM | POA: Diagnosis not present

## 2024-06-11 DIAGNOSIS — Z801 Family history of malignant neoplasm of trachea, bronchus and lung: Secondary | ICD-10-CM | POA: Insufficient documentation

## 2024-06-11 DIAGNOSIS — E876 Hypokalemia: Secondary | ICD-10-CM | POA: Diagnosis not present

## 2024-06-11 DIAGNOSIS — D649 Anemia, unspecified: Secondary | ICD-10-CM | POA: Diagnosis not present

## 2024-06-11 DIAGNOSIS — Z800 Family history of malignant neoplasm of digestive organs: Secondary | ICD-10-CM | POA: Insufficient documentation

## 2024-06-11 DIAGNOSIS — R634 Abnormal weight loss: Secondary | ICD-10-CM | POA: Diagnosis not present

## 2024-06-11 NOTE — Progress Notes (Signed)
 [facility] CONSULT NOTE  Patient Care Team: Auston Reyes BIRCH, MD as PCP - General (Internal Medicine) Rennie Dawn SAUNDERS, MD as Consulting Physician (Hematology) Maree Jannett POUR, MD as Consulting Physician (Neurology)  CHIEF COMPLAINTS/PURPOSE OF CONSULTATION: Monoclonal gammopathy  HEMATOLOGY HISTORY  # OCT 2022 [Dr.Shah; Neuropathy-]Immunofixation shows IgG monoclonal protein with lambda light chain specificity; K/L=WNL; MAY 2023- 0.1mg /dl; K/l= N.   # CKD stage- IIIA- [Dr.Korrpati]; # Temporal arteritis: Continue the small dose of prednisone  daily; OA  HISTORY OF PRESENTING ILLNESS: Alone.  Ambulating independently.  Dawn Mcintyre 89 y.o.  female wit CKD stage III, mild anemia and MGUS is here for a follow up.  Discussed the use of AI scribe software for clinical note transcription with the patient, who gave verbal consent to proceed.  History of Present Illness Dawn Mcintyre is an 89 year old female with CKD, mild anemia, and MGUS who presents with unintentional weight loss.  She has experienced an unintentional weight loss of approximately three pounds, with her weight decreasing from 139 to 136 pounds. She is not actively trying to lose weight and attributes the loss to possibly not eating as much as before.  Her recent blood work indicates mild hypokalemia, with low potassium levels noted. She recalls having low potassium levels once before. She consumes a banana daily and is open to eating more tomatoes and bananas to address this issue.  Her myeloma markers remain stable and unchanged. She understands that her condition, MGUS, is a precancerous state and not actual cancer.  Her mother, who is 63 years old, is experiencing ups and downs in her health. There is no mention of any other family history relevant to her current conditions.  No intentional weight loss, changes in diet, or eating habits reported.   Review of Systems  Constitutional:   Positive for malaise/fatigue. Negative for chills, diaphoresis, fever and weight loss.  HENT:  Negative for nosebleeds and sore throat.   Eyes:  Negative for double vision.  Respiratory:  Negative for cough, hemoptysis, sputum production, shortness of breath and wheezing.   Cardiovascular:  Negative for chest pain, palpitations, orthopnea and leg swelling.  Gastrointestinal:  Negative for abdominal pain, blood in stool, constipation, diarrhea, heartburn, melena, nausea and vomiting.  Genitourinary:  Negative for dysuria, frequency and urgency.  Musculoskeletal:  Negative for back pain and joint pain.  Skin: Negative.  Negative for itching and rash.  Neurological:  Positive for tingling. Negative for dizziness, focal weakness, weakness and headaches.  Endo/Heme/Allergies:  Does not bruise/bleed easily.  Psychiatric/Behavioral:  Negative for depression. The patient is not nervous/anxious and does not have insomnia.     MEDICAL HISTORY:  Past Medical History:  Diagnosis Date   Arteritis    Arthritis    Atrophic vaginitis 11/16/2014   Carotid artery stenosis    Cataract    Difficult intubation    Environmental and seasonal allergies    GERD (gastroesophageal reflux disease)    Hyperlipidemia    Hypertension    Lack of bladder control    Neuropathy    Osteoporosis    Pneumonia     x 75yrs ago   Pre-diabetes    Shingles    Ulcer     SURGICAL HISTORY: Past Surgical History:  Procedure Laterality Date   ABDOMINAL HYSTERECTOMY  1972   BREAST CYST EXCISION Right 1972   BREAST CYST EXCISION Left 1972   ENDARTERECTOMY Left 12/24/2017   Procedure: ENDARTERECTOMY CAROTID;  Surgeon: Jama Cordella MATSU,  MD;  Location: [facility] ORS;  Service: Vascular;  Laterality: Left;   ENDARTERECTOMY Right 03/13/2018   Procedure: ENDARTERECTOMY CAROTID;  Surgeon: Jama Cordella MATSU, MD;  Location: [facility] OR[facility];  Service: Vascular;  Laterality: Right;   ESOPHAGOGASTRODUODENOSCOPY (EGD) WITH PROPOFOL  N/A  11/28/2020   Procedure: ESOPHAGOGASTRODUODENOSCOPY (EGD) WITH PROPOFOL ;  Surgeon: Jinny Carmine, MD;  Location: [facility] ENDOSCOPY;  Service: Endoscopy;  Laterality: N/A;   EYE SURGERY     RENAL ANGIOGRAPHY N/A 03/05/2022   Procedure: RENAL ANGIOGRAPHY;  Surgeon: Jama Cordella MATSU, MD;  Location: [facility] INVASIVE CV LAB;  Service: Cardiovascular;  Laterality: N/A;   THROAT SURGERY     x 10 yrs ago. throat mass.   TONSILLECTOMY      SOCIAL HISTORY: Social History   Socioeconomic History   Marital status: Widowed    Spouse name: Not on file   Number of children: Not on file   Years of education: Not on file   Highest education level: Not on file  Occupational History   Not on file  Tobacco Use   Smoking status: Never    Passive exposure: Never   Smokeless tobacco: Never  Vaping Use   Vaping status: Never Used  Substance and Sexual Activity   Alcohol use: No    Alcohol/week: 0.0 standard drinks of alcohol   Drug use: No   Sexual activity: Not Currently  Other Topics Concern   Not on file  Social History Narrative   Not on file   Social Drivers of Health   Financial Resource Strain: Low Risk  (02/10/2024)   [facility]   Overall Financial Resource Strain (CARDIA)    Difficulty of Paying Living Expenses: Not hard at all  Food Insecurity: No Food Insecurity (02/10/2024)   Received from [facility]   Hunger Vital Sign    Within the past 12 months, you worried that your food would run out before you got the money to buy more.: Never true    Within the past 12 months, the food you bought just didn't last and you didn't have money to get more.: Never true  Transportation Needs: No Transportation Needs (02/10/2024)   Received from [facility] - Transportation    In the past 12 months, has lack of transportation kept you from medical appointments or from getting medications?: No    Lack of Transportation  (Non-Medical): No  Physical Activity: Not on file  Stress: Not on file  Social Connections: Unknown (08/14/2023)   Social Connection and Isolation Panel    Frequency of Communication with Friends and Family: More than three times a week    Frequency of Social Gatherings with Friends and Family: More than three times a week    Attends Religious Services: Not on file    Active Member of Clubs or Organizations: Not on file    Attends Banker Meetings: Not on file    Marital Status: Not on file  Intimate Partner Violence: Not At Risk (08/14/2023)   Humiliation, Afraid, Rape, and Kick questionnaire    Fear of Current or Ex-Partner: No    Emotionally Abused: No    Physically Abused: No    Sexually Abused: No    FAMILY HISTORY: Family History  Problem Relation Age of Onset   Anuerysm Mother    Prostate cancer Father    Esophageal cancer Sister    Lung cancer Sister    Diabetes Brother  Hearing loss Brother    Bladder Cancer Neg Hx    Kidney cancer Neg Hx    Breast cancer Neg Hx     ALLERGIES:  is allergic to codeine, hydrocodone , metronidazole, propoxyphene, sulfa  antibiotics, amoxicillin-pot clavulanate, and latex.  MEDICATIONS:  Current Outpatient Medications  Medication Sig Dispense Refill   ALPRAZolam  (XANAX ) 0.25 MG tablet Take 0.25 mg by mouth at bedtime as needed for sleep.     ascorbic acid  (VITAMIN C) 1000 MG tablet Take 1,000 mg by mouth daily.     aspirin  81 MG tablet Take 81 mg by mouth daily.     atorvastatin  (LIPITOR) 10 MG tablet Take 1 tablet (10 mg total) by mouth daily. 30 tablet 4   conjugated estrogens  (PREMARIN ) vaginal cream Apply one pea-sized amount around the opening of the urethra three times weekly. 30 g 3   cyanocobalamin (VITAMIN B12) 1000 MCG/ML injection Inject into the muscle.     felodipine  (PLENDIL ) 5 MG 24 hr tablet Take 5 mg by mouth every morning.     fexofenadine (ALLEGRA) 180 MG tablet Take 180 mg by mouth daily as needed for  allergies.      fluticasone  (FLONASE ) 50 MCG/ACT nasal spray Place into both nostrils.     hydrALAZINE  (APRESOLINE ) 25 MG tablet Take 25 mg by mouth 3 (three) times daily.     metoprolol  tartrate (LOPRESSOR ) 50 MG tablet Take 1 tablet (50 mg total) by mouth 2 (two) times daily. 60 tablet 0   mirabegron  ER (MYRBETRIQ ) 50 MG TB24 tablet Take 1 tablet (50 mg total) by mouth daily. 90 tablet 3   nitrofurantoin , macrocrystal-monohydrate, (MACROBID ) 100 MG capsule Take 1 capsule (100 mg total) by mouth daily. 90 capsule 3   predniSONE  (DELTASONE ) 2.5 MG tablet Take 2.5 mg by mouth daily.     No current facility-administered medications for this visit.    PHYSICAL EXAMINATION:   Vitals:   06/11/24 1012  BP: 120/68  Pulse: 66  Resp: 18  Temp: (!) 96.4 F (35.8 C)  SpO2: 98%    Filed Weights   06/11/24 1012  Weight: 136 lb 6.4 oz (61.9 kg)    Physical Exam Vitals and nursing note reviewed.  HENT:     Head: Normocephalic and atraumatic.     Mouth/Throat:     Pharynx: Oropharynx is clear.  Eyes:     Extraocular Movements: Extraocular movements intact.     Pupils: Pupils are equal, round, and reactive to light.  Cardiovascular:     Rate and Rhythm: Normal rate and regular rhythm.  Pulmonary:     Comments: Decreased breath sounds bilaterally.  Abdominal:     Palpations: Abdomen is soft.  Musculoskeletal:        General: Normal range of motion.     Cervical back: Normal range of motion.  Skin:    General: Skin is warm.  Neurological:     General: No focal deficit present.     Mental Status: She is alert and oriented to person, place, and time.  Psychiatric:        Behavior: Behavior normal.        Judgment: Judgment normal.     LABORATORY DATA:  I have reviewed the data as listed Lab Results  Component Value Date   WBC 5.8 05/28/2024   HGB 12.5 05/28/2024   HCT 38.3 05/28/2024   MCV 94.1 05/28/2024   PLT 197 05/28/2024   Recent Labs    08/12/23 2346  08/13/23 0414 08/14/23  9457 12/02/23 1352 05/28/24 0923  NA 137   < > 136 137 137  K 3.1*   < > 3.4* 3.7 3.0*  CL 103   < > 101 104 100  CO2 24   < > 23 25 28   GLUCOSE 124*   < > 101* 131* 117*  BUN 13   < > 8 22 20   CREATININE 0.99   < > 0.86 1.18* 0.94  CALCIUM  9.6   < > 8.8* 9.7 9.8  GFRNONAA 55*   < > >60 44* 58*  PROT 6.6  --   --  6.5 6.9  ALBUMIN 3.8  --   --  3.8 3.9  AST 20  --   --  19 18  ALT 12  --   --  14 10  ALKPHOS 56  --   --  66 65  BILITOT 0.5  --   --  0.5 0.8   < > = values in this interval not displayed.     No results found.   Lab Results  Component Value Date   KPAFRELGTCHN 15.1 05/28/2024   KPAFRELGTCHN 15.7 12/02/2023   KPAFRELGTCHN 17.5 05/20/2023   LAMBDASER 15.7 05/28/2024   LAMBDASER 13.9 12/02/2023   LAMBDASER 13.8 05/20/2023   KAPLAMBRATIO 0.96 05/28/2024   KAPLAMBRATIO 1.13 12/02/2023   KAPLAMBRATIO 1.27 05/20/2023     MGUS (monoclonal gammopathy of unknown significance) #October 2022 [neurology] IgG lambda 0.2 g/dL; kappa lambda light chain ratio normal.  Chronic kidney disease GFR 50-60; mild anemia hemoglobin 11; normal calcium .  No worsening bone pain. # OCT 2025-0.2 gm/dl Immunofixation shows IgG monoclonal protein with kappa light chain Specificity; K/L light chain= normal.  Clinically MGUS, no significant concerns of any progression of disease. Not related to multiple myeloma.    # Anemia-hemoglobin around 12 stable.  April 2024 ferritin- 16; I sat- 22  recommend continue gentle iron .   # weight loss- ? Etiology- relucttant with nutrition ebaluatution.   # Hypokalemia- recommend dietary supp.   #Chronic kidney disease-stage-stage III [Dr. Singh/Dr. Kolluru.]; 44-  Recommend increased fluid intake.   Stable  # PVD- s/p renal renal stent [Right; Dr.Schneir]- stable.   # DISPOSITION: # Follow up in 6  months-MD;  2 weeks Prior- cbc/cmp/MM panel; K/l light chains; iron  studies/ferritin-Dr.B      All questions were  answered. The patient knows to call the clinic with any problems, questions or concerns.      Dawn JONELLE Joe, MD 06/13/2024 4:44 PM

## 2024-06-11 NOTE — Assessment & Plan Note (Addendum)
#  October 2022 [neurology] IgG lambda 0.2 g/dL; kappa lambda light chain ratio normal.  Chronic kidney disease GFR 50-60; mild anemia hemoglobin 11; normal calcium .  No worsening bone pain. # OCT 2025-0.2 gm/dl Immunofixation shows IgG monoclonal protein with kappa light chain Specificity; K/L light chain= normal.  Clinically MGUS, no significant concerns of any progression of disease. Not related to multiple myeloma.    # Anemia-hemoglobin around 12 stable.  April 2024 ferritin- 16; I sat- 22  recommend continue gentle iron .   # weight loss- ? Etiology- relucttant with nutrition ebaluatution.   # Hypokalemia- recommend dietary supp.   #Chronic kidney disease-stage-stage III [Dr. Singh/Dr. Kolluru.]; 44-  Recommend increased fluid intake.   Stable  # PVD- s/p renal renal stent [Right; Dr.Schneir]- stable.   # DISPOSITION: # Follow up in 6  months-MD;  2 weeks Prior- cbc/cmp/MM panel; K/l light chains; iron  studies/ferritin-Dr.B

## 2024-06-11 NOTE — Progress Notes (Signed)
 She is asking is there is any changes in her labs since last visit?

## 2024-08-02 ENCOUNTER — Emergency Department

## 2024-08-02 ENCOUNTER — Emergency Department
Admission: EM | Admit: 2024-08-02 | Discharge: 2024-08-03 | Disposition: A | Discharge status: Home / Self Care | Attending: Emergency Medicine | Admitting: Emergency Medicine

## 2024-08-02 DIAGNOSIS — Z7901 Long term (current) use of anticoagulants: Secondary | ICD-10-CM | POA: Insufficient documentation

## 2024-08-02 DIAGNOSIS — I2510 Atherosclerotic heart disease of native coronary artery without angina pectoris: Secondary | ICD-10-CM | POA: Diagnosis not present

## 2024-08-02 DIAGNOSIS — E1122 Type 2 diabetes mellitus with diabetic chronic kidney disease: Secondary | ICD-10-CM | POA: Insufficient documentation

## 2024-08-02 DIAGNOSIS — I129 Hypertensive chronic kidney disease with stage 1 through stage 4 chronic kidney disease, or unspecified chronic kidney disease: Secondary | ICD-10-CM | POA: Insufficient documentation

## 2024-08-02 DIAGNOSIS — N189 Chronic kidney disease, unspecified: Secondary | ICD-10-CM | POA: Insufficient documentation

## 2024-08-02 DIAGNOSIS — G609 Hereditary and idiopathic neuropathy, unspecified: Secondary | ICD-10-CM | POA: Diagnosis not present

## 2024-08-02 DIAGNOSIS — M1711 Unilateral primary osteoarthritis, right knee: Secondary | ICD-10-CM | POA: Diagnosis not present

## 2024-08-02 DIAGNOSIS — M25561 Pain in right knee: Secondary | ICD-10-CM | POA: Diagnosis present

## 2024-08-02 LAB — URINALYSIS, ROUTINE W REFLEX MICROSCOPIC
Bilirubin Urine: NEGATIVE
Glucose, UA: NEGATIVE mg/dL
Hgb urine dipstick: NEGATIVE
Ketones, ur: NEGATIVE mg/dL
Leukocytes,Ua: NEGATIVE
Nitrite: NEGATIVE
Protein, ur: NEGATIVE mg/dL
Specific Gravity, Urine: 1.01 (ref 1.005–1.030)
pH: 6 (ref 5.0–8.0)

## 2024-08-02 MED ORDER — TRAMADOL HCL 50 MG PO TABS
50.0000 mg | ORAL_TABLET | Freq: Once | ORAL | Status: AC
  Administered 2024-08-02: 50 mg via ORAL
  Filled 2024-08-02: qty 1

## 2024-08-02 NOTE — ED Triage Notes (Signed)
 Pt presents to the ED via POV from home with cousin. Pt reports right knee swelling and pain that she first noticed today. Pt denies injury or trauma. No redness or warmth noted.

## 2024-08-02 NOTE — Discharge Instructions (Signed)
 Your x-ray shows arthritis and your exam was overall reassuring with low clinical concern for blood clots in your legs.  You are currently taking anticoagulation therapy which should further limit your risk for developing blood clots.  Your ultrasound was also not available at time of your discharge.  Please follow-up by logging into [facility] MyChart to follow your results.  You should take the previously prescribed tramadol  for any ongoing leg pain.  See your primary provider for ongoing evaluation.  Return to ED if needed.

## 2024-08-02 NOTE — ED Notes (Signed)
 XR at bedside

## 2024-08-02 NOTE — ED Provider Notes (Signed)
 "   [facility] Emergency Department Provider Note     Event Date/Time   First MD Initiated Contact with Patient 08/02/24 1827     (approximate)   History   Knee Pain   HPI  Dawn Mcintyre is a 89 y.o. female with a history of DM type II, HTN, GERD, CKD, and CAD, and PAD on anticoagulation who presents to the ED for nontraumatic knee pain.  Patient will be Dors some swelling and pain that started today.  No falls, slips, or trips.  She denies any click, lock, or give way to the right knee..  Patient presents to the ED accompanied by her family, for evaluation of knee pain.  Upon further evaluation, patient is also endorsing some bilateral leg pain, including some crampy pain last night while asleep.  She also reports some pain in her feet related to her known history of neuropathy.   Physical Exam   Triage Vital Signs: ED Triage Vitals [08/02/24 1642]  Encounter Vitals Group     BP (!) 147/70     Girls Systolic BP Percentile      Girls Diastolic BP Percentile      Boys Systolic BP Percentile      Boys Diastolic BP Percentile      Pulse Rate 90     Resp 18     Temp 98.4 F (36.9 C)     Temp Source Oral     SpO2 99 %     Weight 136 lb (61.7 kg)     Height 5' 7 (1.702 m)     Head Circumference      Peak Flow      Pain Score 10     Pain Loc      Pain Education      Exclude from Growth Chart     Most recent vital signs: Vitals:   08/02/24 1932 08/03/24 0002  BP: 118/65 (!) 140/100  Pulse: 67 65  Resp: 19 16  Temp: 98.4 F (36.9 C)   SpO2: 100% 99%    General Awake, no distress.  NAD HEENT NCAT. PERRL. EOMI. No rhinorrhea. Mucous membranes are moist.  CV:  Good peripheral perfusion.  No CCE distally.  Superficial varicosities noted to the BLE. RESP:  Normal effort.  CTA ABD:  No distention.  Soft and nontender MSK:  Bilateral knees with bony changes consistent with underlying OA.  The right knee with some subtle soft tissue swelling.   AROM of all extremities.  No calf or Achilles tenderness is noted distally.  Skin is warm, dry, and intact.   ED Results / Procedures / Treatments   Labs (all labs ordered are listed, but only abnormal results are displayed) Labs Reviewed  URINALYSIS, ROUTINE W REFLEX MICROSCOPIC - Abnormal; Notable for the following components:      Result Value   Color, Urine YELLOW (*)    APPearance CLEAR (*)    All other components within normal limits     EKG   RADIOLOGY  I personally viewed and evaluated these images as part of my medical decision making, as well as reviewing the written report by the radiologist.  ED Provider Interpretation: No x-ray evidence of any acute fracture or dislocation.  Symptoms consistent with chronic OA  DG Knee 2 Views Right Result Date: 08/02/2024 CLINICAL DATA:  Nontraumatic right knee pain.  Swelling. EXAM: RIGHT KNEE - 1-2 VIEW COMPARISON:  None Available. FINDINGS: Lateral tibiofemoral joint space narrowing. Mild  to moderate tricompartmental peripheral spurring. No fracture, erosion, or focal bone abnormality. Trace joint effusion. Vascular calcifications. Mild soft tissue edema. IMPRESSION: Mild to moderate tricompartmental osteoarthritis, most prominent in the lateral tibiofemoral compartment. Electronically Signed   By: Andrea Gasman M.D.   On: 08/02/2024 20:32     PROCEDURES:  Critical Care performed: No  Procedures   MEDICATIONS ORDERED IN ED: Medications  traMADol  (ULTRAM ) tablet 50 mg (50 mg Oral Given 08/02/24 1929)     IMPRESSION / MDM / ASSESSMENT AND PLAN / ED COURSE  I reviewed the triage vital signs and the nursing notes.                              Differential diagnosis includes, but is not limited to, DJD, OA, bursitis, tendinitis  Patient's presentation is most consistent with acute complicated illness / injury requiring diagnostic workup.  Patient's diagnosis is consistent with prior primary osteoarthritis of the  knee on the right.  Patient with return exam and workup at this time.  Clinically she presents with some mild effusion to the right knee with underlying chronic changes consistent with DJD.  Low concern for DVT bilaterally as patient has no acute calf pain, no unilateral swelling, and no increased redness.  Patient is also on a daily course of anticoagulation therapy.  Patient also asked to be discharged in the ED, after the protracted wait regarding ultrasound results.  Patient stable at this time and I believe appropriate for outpatient management.  Patient will be discharged home with instructions to take her previously prescribed tramadol .. Patient is to follow up with primary provider as needed or otherwise directed.  She will follow her pending ultrasound results on [facility] MyChart as suggested.  Patient is given ED precautions to return to the ED for any worsening or new symptoms.     FINAL CLINICAL IMPRESSION(S) / ED DIAGNOSES   Final diagnoses:  Primary osteoarthritis of right knee  Idiopathic peripheral neuropathy     Rx / DC Orders   ED Discharge Orders     None        Note:  This document was prepared using Dragon voice recognition software and may include unintentional dictation errors.    Loyd Candida LULLA Aldona, PA-C 08/03/24 0004  "

## 2024-08-03 NOTE — ED Notes (Signed)
 AVS provided by edp was reviewed with pt and Tim (pts son / legal guardian) via telephone. Pt verbalized understanding with no additional questions at this time.  Tim confirmed pt could depart with sydney at bedside. Pt to go home ambulatory with personal cane

## 2024-12-09 ENCOUNTER — Inpatient Hospital Stay

## 2024-12-23 ENCOUNTER — Inpatient Hospital Stay: Admitting: Internal Medicine

## 2025-04-18 ENCOUNTER — Encounter (INDEPENDENT_AMBULATORY_CARE_PROVIDER_SITE_OTHER)

## 2025-04-18 ENCOUNTER — Ambulatory Visit (INDEPENDENT_AMBULATORY_CARE_PROVIDER_SITE_OTHER): Admitting: Vascular Surgery

## 2025-05-16 ENCOUNTER — Ambulatory Visit: Admitting: Urology
